# Patient Record
Sex: Male | Born: 1935 | Race: White | Hispanic: No | Marital: Married | Smoking: Former smoker
Health system: Southern US, Community
[De-identification: ages and names within clinical notes are randomized; demographics above are authoritative.]

## PROBLEM LIST (undated history)

## (undated) DIAGNOSIS — E119 Type 2 diabetes mellitus without complications: Secondary | ICD-10-CM

## (undated) DIAGNOSIS — K311 Adult hypertrophic pyloric stenosis: Secondary | ICD-10-CM

## (undated) DIAGNOSIS — K219 Gastro-esophageal reflux disease without esophagitis: Secondary | ICD-10-CM

## (undated) DIAGNOSIS — K279 Peptic ulcer, site unspecified, unspecified as acute or chronic, without hemorrhage or perforation: Secondary | ICD-10-CM

## (undated) DIAGNOSIS — I2510 Atherosclerotic heart disease of native coronary artery without angina pectoris: Secondary | ICD-10-CM

## (undated) DIAGNOSIS — I10 Essential (primary) hypertension: Secondary | ICD-10-CM

## (undated) DIAGNOSIS — I480 Paroxysmal atrial fibrillation: Secondary | ICD-10-CM

## (undated) DIAGNOSIS — C679 Malignant neoplasm of bladder, unspecified: Secondary | ICD-10-CM

## (undated) DIAGNOSIS — C787 Secondary malignant neoplasm of liver and intrahepatic bile duct: Secondary | ICD-10-CM

## (undated) DIAGNOSIS — M069 Rheumatoid arthritis, unspecified: Secondary | ICD-10-CM

## (undated) DIAGNOSIS — Z9289 Personal history of other medical treatment: Secondary | ICD-10-CM

## (undated) DIAGNOSIS — N200 Calculus of kidney: Secondary | ICD-10-CM

## (undated) HISTORY — DX: Gastro-esophageal reflux disease without esophagitis: K21.9

---

## 1999-01-21 ENCOUNTER — Other Ambulatory Visit: Admission: RE | Admit: 1999-01-21 | Discharge: 1999-01-21 | Payer: Self-pay | Admitting: Urology

## 2002-11-03 ENCOUNTER — Inpatient Hospital Stay (HOSPITAL_COMMUNITY): Admission: AD | Admit: 2002-11-03 | Discharge: 2002-11-06 | Payer: Self-pay | Admitting: Emergency Medicine

## 2002-12-02 ENCOUNTER — Emergency Department (HOSPITAL_COMMUNITY): Admission: EM | Admit: 2002-12-02 | Discharge: 2002-12-02 | Payer: Self-pay | Admitting: Emergency Medicine

## 2004-07-29 ENCOUNTER — Inpatient Hospital Stay (HOSPITAL_BASED_OUTPATIENT_CLINIC_OR_DEPARTMENT_OTHER): Admission: RE | Admit: 2004-07-29 | Discharge: 2004-07-29 | Payer: Self-pay | Admitting: Cardiology

## 2008-03-26 ENCOUNTER — Ambulatory Visit: Payer: Self-pay | Admitting: Internal Medicine

## 2008-04-09 ENCOUNTER — Ambulatory Visit: Payer: Self-pay | Admitting: Internal Medicine

## 2009-12-23 ENCOUNTER — Emergency Department (HOSPITAL_COMMUNITY): Admission: EM | Admit: 2009-12-23 | Discharge: 2009-12-23 | Payer: Self-pay | Admitting: Family Medicine

## 2009-12-24 ENCOUNTER — Emergency Department (HOSPITAL_COMMUNITY): Admission: EM | Admit: 2009-12-24 | Discharge: 2009-12-24 | Payer: Self-pay | Admitting: Family Medicine

## 2010-09-09 NOTE — Cardiovascular Report (Signed)
NAMEDEKLIN, BIELER               ACCOUNT NO.:  [id number]   MEDICAL RECORD NO.:  [id number]          PATIENT TYPE:  OIB   LOCATION:  6501                         FACILITY:  [facility]   PHYSICIAN:  Mohan N. Sharyn Lull, M.D. DATE OF BIRTH:  February 08, 1931   DATE OF PROCEDURE:  07/29/2004  DATE OF DISCHARGE:                              CARDIAC CATHETERIZATION   PROCEDURE:  1.  Left cardiac cath with selective left and right coronary angiography.  2.  LV-graphy via right groin using Judkins technique.   INDICATIONS FOR PROCEDURE:  Mr. Fries is a 77 year old white male with past  medical history significant for peptic ulcer disease, benign hypertrophy of  prostate.  He came to the office complaining of retrosternal chest  discomfort associated with shortness of breath and feeling weak and tired.  He denies any nausea, vomiting, diaphoresis.  Denies PND, orthopnea, or leg  swelling.  Denies palpitation, lightheadedness, or syncope.  States lately  he gets tired and short of breath with minimal exertion and feels fatigued.  Denies rest or nocturnal angina.  Denies any cardiac workup in the past.   PAST MEDICAL HISTORY:  1.  As above.  2.  History of upper GI bleeding.   PAST SURGICAL HISTORY:  None.   ALLERGIES:  None.   MEDICATIONS:  Takes Cardura 2 mg p.o. q.d., baby aspirin p.r.n., and  vitamins.   SOCIAL HISTORY:  He is married, one child, chews tobacco for 40+ years, no  history of alcohol abuse, was a Chiropodist.   FAMILY HISTORY:  Father died of stroke at the age of 27.  He was  hypertensive.  Mother had Alzheimer's disease.  Three brothers in good  health, two sisters in good.  One brother died of Alzheimer's disease.   PHYSICAL EXAMINATION:  GENERAL:  He is alert, awake, oriented x 3, in no  acute distress.  VITAL SIGNS:  Blood pressure was 130/80, pulse was 77.  HEENT:  Conjunctivae pink.  NECK:  Supple.  No JVD, no bruit.  LUNGS:  Clear to auscultation without  rhonchi or rales.  CARDIOVASCULAR:  S1 and S2 was normal.  There was soft systolic murmur and  soft S4 gallop.  ABDOMEN:  Soft.  Bowel sounds were present, nontender.  EXTREMITIES:  There is no clubbing, cyanosis, or edema.   EKG done in the office showed normal sinus rhythm with no acute ischemic  changes.   IMPRESSION:  1.  New onset angina, rule out coronary insufficiency.  2.  Tobacco abuse.  3.  Positive history of peptic ulcer disease.  4.  Exertional dyspnea, weakness, probably angina equivalent, rule out      coronary insufficiency.  Discussed with the patient regarding various      options of treatment, i.e., noninvasive stress testing versus left cath,      its risks and benefits, i.e., death, MI, stroke, need for emergency      CABG, local vascular complications, accept and consented for left cath.      The patient was also started on baby aspirin 1 tablet daily, Toprol 25  mg 1/2 tablet daily, and Nexium 40 mg p.o. daily.   PROCEDURE:  After obtaining the informed consent, the patient was brought to  the cath lab and was placed on fluoroscopy table.  The right groin was  prepped and draped in usual fashion.  Xylocaine 2% was used for local  anesthesia in the right groin.  With __________ thin-wall needle 4-French  arterial sheath was placed.  The sheath was aspirated and flushed.  Next, a  4 French left Judkins catheter was advanced over the wire under fluoroscopic  guidance up into the ascending aorta.  Wire was pulled out.  The catheter  was aspirated and connected to the manifold.  Catheter was further advanced  into left coronary ostium.  Multiple views of the left system were taken.  Next the catheter was disengaged and was pulled out over the wire and was  replaced with 4-French right Judkins catheter which was advanced over the  wire under fluoroscopic guidance up to the ascending aorta.  Wire was pulled  out.  The catheter was aspirated and connected to the  manifold.  Catheter  was further advanced and engaged into right coronary ostium.  Multiple views  of the right system were taken.  Next the catheter was disengaged and was  pulled out over the wire and was replaced with 4-French pigtail catheter  which was advanced over the wire under fluoroscopic guidance up to the  ascending aorta.  Wire was pulled out.  The catheter was aspirated and  connected to the manifold.  Catheter was further advanced across the aortic  valve into the LV.  LV pressures were recorded.  Next LV-graphy was done in  30-degree RAO position.  Postangiographic pressures were recorded from LV,  and then pullback pressures were recorded from the aorta.  There was no  gradient across the aortic valve.  Next the pigtail catheter was pulled out  over the wire.  Sheaths were aspirated and flushed.   FINDINGS:  LV showed good LV systolic function.  There was mild  anterolateral wall hypokinesia, EF of 50-55%.  Left main was patent.  LAD  has 50-60% proximal stenosis.  Diagonal 1 is small which is patent.  Diagonal 2 is very small which is patent.  Diagonal 3 and 4 were very, very  small which were patent.  Left circumflex was large which was patent.  OM1  is very, very small which is patent.  OM 2 is large which has proximal  aneurysmal dilatation which is patent.  OM 3 is small which is patent.  RCA  is small which is patent.  The patient has codominant coronary system.  The  patient tolerated procedure well.  There were no complications.  The patient  was on his way to recovery room in stable condition.      MNH/MEDQ  D:  07/29/2004  T:  07/29/2004  Job:  [id number]   cc:   Cardiac Cath Lab

## 2010-09-09 NOTE — H&P (Signed)
NAMEELLWOOD, STEIDLE NO.:  [id number]   MEDICAL RECORD NO.:  [id number]                   PATIENT TYPE:  EMS   LOCATION:  MAJO                                 FACILITY:  [facility]   PHYSICIAN:  Iva Boop, M.D. [facility]           DATE OF BIRTH:  12-15-1930   DATE OF ADMISSION:  11/03/2002  DATE OF DISCHARGE:                                HISTORY & PHYSICAL   PRIMARY CARE PHYSICIAN:  Windle Guard, M.D., of [facility].  The phone number is [phone].   CHIEF COMPLAINT:  Soft black stools with nausea, weakness, and dizziness.   HISTORY OF PRESENT ILLNESS:  Mr. Covin is a 77 year old white male who has  a history of GI bleeds occurring about 40 years ago and then again about 20  years ago, per his history.  He did require transfusions on these occasions,  but no surgery.  Occasionally he is now using Pepcid AC for meal-induced  dyspepsia.  He denies chronic nausea.  Denies chronic pain.  Denies bleeding  per rectum in the past.  He was seen by Dr. Jeannetta Nap on October 10, 2002, for  complaint of dizziness and weakness.  At that time, he had a lot of labs  drawn and an EKG.  The EKG was normal.  The hemoglobin was 6.3.  A PSA was  elevated at 9.1 and BNP was normal at 72.4.  I am not certain if he had a  chest x-ray or not.  The patient uses Dramamine occasionally for symptoms of  inner ear problems that include dizziness and occasional nausea.   The patient has been having soft black stools since Saturday.  Per Dr.  Ledell Noss rectal exam, there is melena present.  He has had persistent  dizziness along with nausea and this morning self induced some nonbloody and  noncoffee ground emesis.  Vital signs in the ED show some tachycardia and  orthostasis of pulse, but blood pressures are not technically orthostatic.  The hemoglobin is 12.8 and again that compares with a hemoglobin of 16.3 on  October 10, 2002.  It is unclear as to whether this  patient ever had lower  endoscopic evaluation because he says that Dr. Annabell Howells removed some polyps or  did some biopsies.  Since Dr. Annabell Howells is a urologist, we are wondering this  might not have been some prostate biopsies rather than colon evaluation.   PAST MEDICAL HISTORY:  1. Peptic ulcer disease with GI bleeds as above.  2. Benign prostatic hypertrophy.  The PSA was elevated at 9.1 in the office     recently.  3. Status post left elbow surgery following injury.  4. Status post childhood appendectomy.  5. Inner ear problems.   ALLERGIES:  No known drug allergies.   MEDICATIONS:  1. Dramamine p.r.n.  BC's rarely and none recently.  2. Pepcid AC p.r.n.  Uses these infrequently  and has no recent acceleration     in the use of Pepcid AC.   SOCIAL HISTORY:  The patient lives with his wife.  He chews one package of  chewing tobacco daily.  Quit smoking tobacco in the late 1970s.  Does not  consume alcoholic beverages.   FAMILY HISTORY:  No colorectal disease, no GI bleeds, and no ulcers.   REVIEW OF SYSTEMS:  NEUROLOGIC:  Chronic intermittent dizziness with or  without nausea which she attributes and has been attributed by physician to  inner ear problems.  These are associated with sinus congestion as well.  CARDIOVASCULAR:  No history of MI or any known coronary disease.  No  palpitations.  No chest pain.  No lower extremity edema.  PULMONARY:  Does  have some shortness of breath with exertion, but it really more fatigue with  the effort than actual shortness of breath.  This has been going on for up  to two months.  Denies cough.  HEMATOLOGIC:  Has not noted any vigorous  bleeding problems.  GENITOURINARY:  Has had frequency, hesitancy, and some  incomplete voiding.  DERMATOLOGIC:  No history of skin cancer.  EARS, NOSE,  AND THROAT:  Denies nonhealing ulcers or sores in the mouth.  All other  systems reviewed and were negative.   PHYSICAL EXAMINATION:  VITAL SIGNS:  Sitting blood  pressure 136/69 with  pulse of 102, standing blood pressure 110/75 with pulse of 106, respirations  20, temperature 99.9 degrees.  GENERAL APPEARANCE:  The patient is a somewhat pale-appearing, older, white  male who is a good historian and is in no distress.  HEENT:  There is no scleral icterus.  The conjunctivae are pink.  Extraocular movements intact.  Oropharynx:  There is no dentition.  Has an  upper denture in place and this was not removed.  No obvious sores.  Mucosa  is without exudates.  CHEST:  Clear to auscultation and percussion with excellent breath sounds  and excursion.  NECK:  No masses.  No bruits.  CORONARY:  There is regular rate and rhythm, though rhythm is slightly  tachycardic.  No murmurs, rubs, or gallops.  ABDOMEN:  Soft, nontender, and nondistended.  Bowel sounds are hypoactive.  No hepatosplenomegaly or masses.  No bruits.  RECTAL:  No masses.  Black, fecal occult blood positive stool per Dr. Beverely Pace.  Rectal exam not repeated.  EXTREMITIES:  3+ dorsalis pedis pulse.  No cyanosis, clubbing, or edema.  NEUROLOGIC:  No tremors.  He is alert and oriented x 3.  He is a bit hard of  hearing.  DERMATOLOGIC:  No spider angiomata.  No ulcers on the trunk, upper  extremities, or lower extremities.  PSYCHIATRIC:  The affect is appropriate.  He is in good spirits.   LABORATORY DATA:  The hemoglobin is 12.8, hematocrit 36.6, white blood cell  count 6.9, platelets 188, and MCV 89.8.  Sodium 142, potassium 4.7, BUN 36,  creatinine 0.2, glucose 127.  Urinalysis:  There is some leukocyte esterase  present, but no nitrites, rare epithelial cells, 0-2 red blood cells, 7-10  white blood cells, and rare bacteria present.  LFTs are within normal  limits.  The PT and INR are pending at this time.   ASSESSMENT:  1. Gastrointestinal bleed, likely upper.  Rule out recurrent peptic ulcer    disease.  Rule out gastrointestinal reflux disease.  Rule out     arteriovenous  malformations.  2. History of gastrointestinal bleed secondary  to peptic ulcer disease in     the now remote past.  3. Mild anemia, though significant drop of hemoglobin of approximately 5.5 g     in the last two to three weeks.  4. Inner ear disease with intermittent symptoms of dizziness and nausea.  5. Complaint of weakness not entirely explained by inner ear disease.  Now     the symptom is worse secondary to gastrointestinal bleeding.  6. History of elevated prostatic specific antigen.  Has symptoms consistent     with benign prostatic hypertrophy, but the urinalysis is slightly     suggestive of a urinary tract infection.   PLAN:  1. The patient is to be admitted for IV fluids and IV Protonix and planned     for upper endoscopy this afternoon.  2. Await coagulation times and correct if needed.  3.     Also plan to check serum Helicobacter pylori and serial hemoglobins and     hematocrits.  4. Diet for now is to be NPO until following endoscopy when clears can     likely be initiated.  5. Will check a urine culture to make sure he does not have a UTI.     Jennye Moccasin, P.A. [facility]                   Iva Boop, M.D. [facility]    SG/MEDQ  D:  11/03/2002  T:  11/03/2002  Job:  [id number]

## 2010-09-09 NOTE — Discharge Summary (Signed)
Craig Waters, Craig Waters NO.:  [id number]   MEDICAL RECORD NO.:  [id number]                   PATIENT TYPE:  INP   LOCATION:  4728                                 FACILITY:  [facility]   PHYSICIAN:  Iva Boop, M.D. [facility]           DATE OF BIRTH:  1930/10/26   DATE OF ADMISSION:  11/03/2002  DATE OF DISCHARGE:  11/06/2002                                 DISCHARGE SUMMARY   ADMISSION DIAGNOSES:  1. Acute gastrointestinal bleed, likely upper.  Rule out peptic ulcer     disease, rule out gastroesophageal reflux disease.  Rule out     arteriovenous malformations.  2. History of gastrointestinal bleed secondary to peptic ulcer disease in     the remote past.  3. Mild anemia though has had a significant drop of 5.5 grams on his     hemoglobin in the last two to three weeks.  4. Inner ear disease with chronic intermittent symptoms of dizziness and     nausea.  5. Complains of weakness not entirely explained by inner ear disease.     Symptoms now worse secondary to gastrointestinal bleed.  6. Recent history of elevated prostatic specific antigens.  Symptoms are     consistent with benign prostatic hypertrophy though urinalysis slightly     suggestive of urinary tract infection.  7. Status post childhood appendectomy.  8. Status post left elbow surgery following injury.   DISCHARGE DIAGNOSES:  1. Gastrointestinal bleed secondary to duodenal ulcer status post hemostatic     therapy with epinephrine injection, BICAP application and endo-clip.  2. Clo-test positive, will be treated with antibiotics as outpatient.  3. Anemia secondary to gastrointestinal bleed.  Status post transfusion with     a total of three units packed red blood cells during this admission.  4. Obstructive uropathy secondary to benign prostatic hypertrophy.  Symptoms     resolved with initiation of Flomax.  No evidence for urinary tract     infection on repeat urinalysis.  Urine cultures  were ordered but never     sent.   BRIEF HISTORY:  Craig Waters is a pleasant and healthy and active 77 year old  white male who has a remote history of peptic ulcer with bleeding about 40  years ago and then about 20 years ago.  He did not require any surgery but  did receive transfusions with these bleeds and used to remotely take  Tagamet.  He had been using Pepcid AC of late for meal induced dyspepsia  which was not a chronic, frequent problem.  He does occasionally get some  epigastric pain.  The patient had seen Dr. Jeannetta Nap on around October 10, 2002  for complaints of dizziness and weakness.  Labs were ordered, one of which  was a hemoglobin which measured 16.3, and a PSA which measured 9.1.  The  patient has chronic intermittent dizziness associated sometimes with nausea  that have been attributed to inner ear problems.  No specific diagnosis for  his weakness was made.  The patient had been using Flomax in the past but  was not having any obstructive urinary symptoms and Flomax was not restarted  by Dr. Jeannetta Nap.  About three days prior to presenting to the emergency room  the patient started having soft black stools and these persisted about once  or twice a day until the day of admission.  His dizziness was more  persistent than usual and it was associated with some nausea.  On the  morning of presentation he self-induced some non bloody and non coffee-  ground emesis.  In the emergency department melena was found on rectal exam.  He had some tachycardia but no technical orthostasis.  The hemoglobin at  that time in the emergency room was measuring 12.8.  Status as to endoscopy  and colonoscopy were unclear; does not sound like he has ever had  colonoscopy because he said Dr. Annabell Howells had removed some polyps and Dr. Annabell Howells  is a urologist.  The patient was evaluated in the emergency room by Dr. Stan Head and admitted for presumed acute upper GI bleed.   LABORATORY DATA:  Hemoglobin  initially 12.8, reached a low of 8.8 on July  13, and was 10.6 at discharge.  Hematocrit low of 25.6, it was 30.4 at  discharge.  Differential within normal limits.  Fecal occult blood testing  positive.  PT 12. 9, INR 1.0 and PTT 37.  Chemistries remarkable for glucose  of 111 and 127 as well as a BUN initially at 36 corrected to 22.  Calcium  low at 7.8.  Albumin low at 3.4.  Total bilirubin 0.5, alkaline phosphatase  63, AST 17, ALT 20, all of these normal.  Initial urinalysis showed some  moderate leukocyte esterase with 7 to 10 white cells, 0 to 2 red cells and  rare  bacteria.  Repeat urinalysis showed only the presence of some ketones  but no leukocytes esterase, no nitrites and microscopic not performed.   HOSPITAL COURSE:  The patient underwent endoscopy on the afternoon of his  arrival by Dr. Leone Payor.  An ulcer was found in the duodenal bulb which was  pulsating with active bleeding.  This area was injected with epinephrine and  initially had hemostasis but he began oozing again and BICAP therapy was  applied.  There was still some oozing and more epinephrine was applied.  The  BICAP coag was applied with the Rby coagulator.  Bleeding did finally stop  after the second epinephrine injection.  The patient was admitted to unit 3300 for close observation following the  endoscopy.   The patient received two units of packed red blood cells early in the wee  hours of November 05, 2002.  He again received a single unit of packed cells  over night of the 14th through the 15th.  Stooling continued and was dark in  color initially, but on the 15th it was brown.   On November 05, 2002 the patient was complaining of severe urinary symptoms and  was in quite a lot of pain and this had begun the night before.  He was  unable to void any significant volume.  An in and out catheter was performed for post void residual and about 800 cc of post void urine were measured.  The Foley was not left in  place.  However he was started on Flomax.  Pyridium was  also added because he complained of some dysuria and it was  difficult to determine whether this was dysuria or bladder type pain and  there was some question as to whether he had a urinary tract infection.  In  any event, over the next six to 24 hours his urologic symptoms resolved; he  was voiding large volumes with no problem.  The patient did have some  elevation in his temperature associated with blood transfusions to 100.4.  These resolved once the transfusions were completed.   The patient's diet was advanced from clear liquids to a low residue diet,  all of which he tolerated.  Nausea and vomiting along with abdominal pain  had never been part of his symptomatology in his presenting with a GI bleed.  The patient was cleared as safe for discharge and was in much improved  condition both from a GI stand-point and a urologic stand-point.   FOLLOW UP:  The patient has a follow up appointment arranged with Dr. Stan Head on Wednesday, November 19, 2002.  He has been advised that he should  contact Dr. Belva Crome office to make an appointment to follow up with him.   The patient was treated with IV Protonix drip initially during his  hospitalization and ultimately was changed over to twice daily oral  Protonix.  He is to continue on twice daily Protonix for the next couple of  weeks along with other medications to be outlined.  He was advised to avoid  any Aleve which he uses occasionally, and other drugs containing aspirin or  aspirin related products.  He was told that Tylenol would be fine to use if  he needed it.  Diet was to be low fiber, soft type foods for the next ten  days and then to resume a regular diet.   DISCHARGE MEDICATIONS:  1. Prilosec over-the-counter 20 mg b.i.d.  (he was given a prescription for     this).  2. Protonix 40 mg b.i.d. for two weeks.  After two weeks he is to drop down     to once daily on these  medications.  Depending on which costs him less he     can use either the over-the-counter Prilosec or the Protonix and he can     consult with the pharmacist as to which will be most cost effective.  3. Flomax 0.4 mg once daily.  4. Amoxicillin 500 mg two pills b.i.d. for 10 days.  5. Biaxin/clarithromycin 500 mg b.i.d. for 10 days.   CONDITION ON DISCHARGE:  Stable and improved.     Jennye Moccasin, P.A. [facility]                   Iva Boop, M.D. [facility]    SG/MEDQ  D:  11/06/2002  T:  11/07/2002  Job:  [id number]   cc:   Windle Guard, M.D.  [address]  [place], [place] [address]  Fax: [phone]   Excell Seltzer. Annabell Howells, M.D.  [address]  [place]  [place] [address]  Fax: [phone]    cc:   Windle Guard, M.D.  [address]  [place], [place] [address]  Fax: [phone]   Excell Seltzer. Annabell Howells, M.D.  [address]  [place]  [place] [address]  Fax: [phone]

## 2010-09-09 NOTE — H&P (Signed)
Craig Waters, Craig Waters NO.:  [id number]   MEDICAL RECORD NO.:  [id number]                   PATIENT TYPE:  INP   LOCATION:  1824                                 FACILITY:  [facility]   PHYSICIAN:  Craig Waters, M.D. [facility]           DATE OF BIRTH:  [birth year]   DATE OF ADMISSION:  11/03/2002  DATE OF DISCHARGE:                                HISTORY & PHYSICAL   CHIEF COMPLAINT:  Black bowel movements, nausea, weakness and dizziness.   HISTORY OF PRESENT ILLNESS:  This is a 77 year old white man with a history  of ulcer bleeding approximately 40, and 20 years ago, requiring transfusions  but no surgery.  He has some dyspepsia after meals and takes some Pepcid AC.  He had been doing reasonably well, and then some dizziness and weakness on  October 10, 2002, and he saw Dr. Windle Waters, his primary care physician.  He  had results to tell us his hemoglobin was 16.3, PSA 9.1, beta natriuretic  peptide 72.4.  He uses some Dramamine for dizziness symptoms.  An  electrocardiogram was unremarkable at that time, by report.  Since Saturday, two days prior to admission, he has had black soft bowel  movements, melena by Dr. Lenard Waters examination today.  He has had  dizziness, particularly with rising, nausea, and he has had some self-  induced non-bloody emesis this morning.  He is slightly orthostatic with  pulse tachy to 114 in the emergency department.  His hemoglobin is 12.8.  He  has used some occasional BC's and other non-steroidals, but it does not  sound like he has used them on a regular basis.   PAST MEDICAL HISTORY:  1. As above.  2. PSA of 9.1, apparently due to benign prostatic hypertrophy.  He is     followed by Dr. Excell Waters. Craig Waters.  3. Status post left elbow surgery after an injury.  4. Status post appendectomy as a child.  5. Inner ear problems, uncertain etiology.   ALLERGIES:  No known drug allergies.   HOME MEDICATIONS:  As above,  Dramamine, rare BC's, Pepcid AC infrequently.   SOCIAL HISTORY:  He lives with his wife.  He chews one packet of tobacco  every day.  He quit smoking late in the 1970s.  No alcohol.   FAMILY HISTORY:  No colorectal cancer or other GI bleeding or ulcer disease.   REVIEW OF SYSTEMS:  Chronic intermittent dizziness associated with some  nausea.  Also some sinus congestion.  He denies any angina symptoms or chest  pain or significant dyspnea.  Urinary frequency and hesitancy and incomplete  voiding are noted.  He has had some skin cancer.  All other systems appear  negative at this time.   PHYSICAL EXAMINATION:  GENERAL:  A pleasant elderly white man, slightly  pale.  VITAL SIGNS:  Blood pressure 136/69 sitting, pulse 102 sitting, blood  pressure 110/75 with a pulse of 106 standing, respirations 20, temperature  99.7 degrees.  HEENT:  Eyes anicteric.  Mouth free of lesions.  NECK:  Supple, no thyromegaly or masses.  CHEST:  Clear.  HEART:  S1, S2.  No murmurs or gallops, slightly tachycardic.  ABDOMEN:  Soft, nontender.  No organomegaly or masses.  Bowel sounds are  present but not increased.  RECTAL:  Per Dr. Ledell Waters exam, melena.  It is fecal occult blood positive.  EXTREMITIES:  Distal pulses intact in the lower extremities.  No clubbing,  cyanosis, or edema.  NEUROLOGIC:  Alert and oriented x3.  No tremor.  SKIN:  He has spider angiomata or rash.  PSYCHIATRIC:  Affect is appropriate.  NODES:  No neck or supraclavicular nodes.  No inguinal nodes.   LABORATORY DATA:  Hemoglobin 12.8, MCV 89, hematocrit 36, white count 6.9,  platelets 188.  PT and PTT are normal.  Liver function tests normal.  BMET:  BUN 36, creatinine 0.9, glucose 127.  A urinalysis shows rare epithelials, 7-  10 white cells, rare bacteria, negative nitrites, positive leukocyte  esterase.   ASSESSMENT:  1. Melena with upper gastrointestinal bleeding, probably recurrent peptic     ulcer disease.  The etiology is  not clear.  I suspect he has never been     evaluated for Helicobacter pylori.  2. Anemia, probably related to number one.  3. Pyuria, uncertain etiology, question related to benign prostatic     hypertrophy/prostatitis.   PLAN:  The patient will be admitted and hydrated.  IV Protonix will be  administered.  Plan for urgent endoscopy later today, to evaluate his  bleeding.  I have explained the risks and benefits and indications of an  upper GI endoscopy, and possible bleeding therapy to the patient.  He agrees  to proceed.                                               Craig Waters, M.D. [facility]    CEG/MEDQ  D:  11/03/2002  T:  11/03/2002  Job:  [id number]   cc:   Craig Waters, M.D.  [address]  [place], [place] [address]  Fax: [phone]    cc:   Craig Waters, M.D.  [address]  [place], [place] [address]  Fax: [phone]

## 2010-09-26 ENCOUNTER — Encounter: Payer: Self-pay | Admitting: Family Medicine

## 2010-09-26 ENCOUNTER — Ambulatory Visit (INDEPENDENT_AMBULATORY_CARE_PROVIDER_SITE_OTHER): Payer: Medicare Other | Admitting: Family Medicine

## 2010-09-26 DIAGNOSIS — N400 Enlarged prostate without lower urinary tract symptoms: Secondary | ICD-10-CM

## 2010-09-26 DIAGNOSIS — K5732 Diverticulitis of large intestine without perforation or abscess without bleeding: Secondary | ICD-10-CM

## 2010-09-26 DIAGNOSIS — K279 Peptic ulcer, site unspecified, unspecified as acute or chronic, without hemorrhage or perforation: Secondary | ICD-10-CM

## 2010-09-26 DIAGNOSIS — H409 Unspecified glaucoma: Secondary | ICD-10-CM

## 2010-09-26 DIAGNOSIS — K5792 Diverticulitis of intestine, part unspecified, without perforation or abscess without bleeding: Secondary | ICD-10-CM

## 2010-09-26 DIAGNOSIS — K219 Gastro-esophageal reflux disease without esophagitis: Secondary | ICD-10-CM

## 2010-09-26 NOTE — Progress Notes (Signed)
  Subjective:    Patient ID: Craig Waters, male    DOB: May 23, 1930, 77 y.o.   MRN: [id number]  HPI Patient is seen to establish care. Past medical history reviewed. History of BPH. Followed by urologist. He takes doxazosin 8 mg daily and apparently still has some obstructive urinary symptoms. He is followed regularly by urologist. He reports a past history of peptic ulcer disease, GERD, glaucoma, and diverticulitis history. Also reported colon polyps.  GERD symptoms stable.  No recent vision changes.  No recent abdominal pain.   He has not had a physical exam in several years.  Patient quit smoking in 1980. Denies alcohol use. Works with sheet metal. He is married.   Review of Systems  Constitutional: Negative for fever, activity change, appetite change and fatigue.  HENT: Negative for ear pain, congestion and trouble swallowing.   Eyes: Negative for pain and visual disturbance.  Respiratory: Negative for cough, shortness of breath and wheezing.   Cardiovascular: Negative for chest pain and palpitations.  Gastrointestinal: Negative for nausea, vomiting, abdominal pain, diarrhea, constipation, blood in stool, abdominal distention and rectal pain.  Genitourinary: Negative for dysuria, hematuria and testicular pain.  Musculoskeletal: Negative for joint swelling and arthralgias.  Skin: Negative for rash.  Neurological: Negative for dizziness, syncope and headaches.  Hematological: Negative for adenopathy.  Psychiatric/Behavioral: Negative for confusion and dysphoric mood.       Objective:   Physical Exam  Constitutional: He is oriented to person, place, and time. He appears well-developed and well-nourished.  HENT:  Right Ear: External ear normal.  Left Ear: External ear normal.  Mouth/Throat: Oropharynx is clear and moist.  Neck: Neck supple. No thyromegaly present.  Cardiovascular: Normal rate, regular rhythm and normal heart sounds.   Pulmonary/Chest: Effort normal and breath  sounds normal. No respiratory distress. He has no wheezes. He has no rales.  Musculoskeletal: He exhibits no edema.  Lymphadenopathy:    He has no cervical adenopathy.  Neurological: He is alert and oriented to person, place, and time.          Assessment & Plan:  #1 reported history of BPH. Still has positive symptomatology. Followup with urologist #2 history of reported peptic ulcer disease #3 history of GERD #4 history of glaucoma #5 history diverticulitis  #6 wellness issues. Send for old records. Schedule followup Medicare wellness exam in 1-2 months

## 2010-10-24 ENCOUNTER — Ambulatory Visit (INDEPENDENT_AMBULATORY_CARE_PROVIDER_SITE_OTHER): Payer: Medicare Other | Admitting: Family Medicine

## 2010-10-24 ENCOUNTER — Encounter: Payer: Self-pay | Admitting: Family Medicine

## 2010-10-24 DIAGNOSIS — R5381 Other malaise: Secondary | ICD-10-CM

## 2010-10-24 DIAGNOSIS — E785 Hyperlipidemia, unspecified: Secondary | ICD-10-CM

## 2010-10-24 DIAGNOSIS — K279 Peptic ulcer, site unspecified, unspecified as acute or chronic, without hemorrhage or perforation: Secondary | ICD-10-CM

## 2010-10-24 DIAGNOSIS — N400 Enlarged prostate without lower urinary tract symptoms: Secondary | ICD-10-CM

## 2010-10-24 DIAGNOSIS — Z0000 Encounter for general adult medical examination without abnormal findings: Secondary | ICD-10-CM

## 2010-10-24 DIAGNOSIS — R5383 Other fatigue: Secondary | ICD-10-CM

## 2010-10-24 LAB — LIPID PANEL
Cholesterol: 161 mg/dL (ref 0–200)
LDL Cholesterol: 117 mg/dL — ABNORMAL HIGH (ref 0–99)
Total CHOL/HDL Ratio: 6

## 2010-10-24 LAB — BASIC METABOLIC PANEL
BUN: 21 mg/dL (ref 6–23)
Chloride: 109 mEq/L (ref 96–112)
Potassium: 4.1 mEq/L (ref 3.5–5.1)
Sodium: 142 mEq/L (ref 135–145)

## 2010-10-24 MED ORDER — FINASTERIDE 5 MG PO TABS: 5.0000 mg | ORAL_TABLET | Freq: Every day | ORAL | Status: DC
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
## 2010-10-24 NOTE — Patient Instructions (Signed)
Try over-the-counter Allegra or Zyrtec for allergy symptoms Recommend yearly flu vaccine

## 2010-10-25 NOTE — Progress Notes (Signed)
Quick Note:  Pt informed. He wanted me to ask what is the plan you talked to him about for his prostate?? ______

## 2010-10-28 NOTE — Progress Notes (Signed)
Quick Note:  Pt informed and will check with his pharmacy ______

## 2011-10-02 ENCOUNTER — Other Ambulatory Visit: Payer: Self-pay | Admitting: Urology

## 2011-10-13 ENCOUNTER — Encounter (HOSPITAL_COMMUNITY): Payer: Self-pay | Admitting: Pharmacy Technician

## 2011-10-18 ENCOUNTER — Encounter (HOSPITAL_COMMUNITY): Payer: Self-pay

## 2011-10-18 ENCOUNTER — Encounter (HOSPITAL_COMMUNITY)
Admission: RE | Admit: 2011-10-18 | Discharge: 2011-10-18 | Disposition: A | Discharge status: Home / Self Care | Payer: Medicare Other | Source: Ambulatory Visit | Attending: Urology | Admitting: Urology

## 2011-10-18 HISTORY — PX: EYE SURGERY: SHX253

## 2011-10-18 HISTORY — PX: OTHER SURGICAL HISTORY: SHX169

## 2011-10-18 HISTORY — PX: CARDIAC CATHETERIZATION: SHX172

## 2011-10-18 HISTORY — PX: APPENDECTOMY: SHX54

## 2011-10-18 HISTORY — PX: CATARACT EXTRACTION, BILATERAL: SHX1313

## 2011-10-18 LAB — COMPREHENSIVE METABOLIC PANEL
ALT: 13 U/L (ref 0–53)
AST: 15 U/L (ref 0–37)
Alkaline Phosphatase: 82 U/L (ref 39–117)
CO2: 25 mEq/L (ref 19–32)
Chloride: 107 mEq/L (ref 96–112)
GFR calc Af Amer: >90 mL/min (ref >90)
GFR calc non Af Amer: 80 mL/min — ABNORMAL LOW (ref >90)
Glucose, Bld: 101 mg/dL — ABNORMAL HIGH (ref 70–99)
Potassium: 4.2 mEq/L (ref 3.5–5.1)
Sodium: 142 mEq/L (ref 135–145)
Total Bilirubin: 0.4 mg/dL (ref 0.3–1.2)

## 2011-10-18 LAB — CBC
Hemoglobin: 15.4 g/dL (ref 13.0–17.0)
MCH: 30.8 pg (ref 26.0–34.0)
Platelets: 175 10*3/uL (ref 150–400)
RBC: 5 MIL/uL (ref 4.22–5.81)
WBC: 5.7 10*3/uL (ref 4.0–10.5)

## 2011-10-18 LAB — SURGICAL PCR SCREEN
MRSA, PCR: POSITIVE — AB
Staphylococcus aureus: POSITIVE — AB

## 2011-10-18 NOTE — Pre-Procedure Instructions (Signed)
10-18-11 EKG/CXR(09-26-11) reports with chart. W. Asees Manfredi,RN 10-18-11 1520 Pt. 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 Also made aware of Contact Isolation required during hospital stay.W. Kennon Portela

## 2011-10-18 NOTE — Patient Instructions (Addendum)
20 Craig Waters  10/18/2011   Your procedure is scheduled on:   7-5--2013  Report to [facility] at 0630       AM .  Call this number if you have problems the morning of surgery: [phone]   Remember:   Do not eat food:After Midnight.    Take these medicines the morning of surgery with A SIP OF WATER: Doxazosin   Do not wear jewelry, make-up or nail polish.  Do not wear lotions, powders, or perfumes. You may wear deodorant.  Do not shave 48 hours prior to surgery.(face and neck okay, no shaving of legs)  Do not bring valuables to the hospital.  Contacts, dentures or bridgework may not be worn into surgery.  Leave suitcase in the car. After surgery it may be brought to your room.  For patients admitted to the hospital, checkout time is 11:00 AM the day of discharge.   Patients discharged the day of surgery will not be allowed to drive home.  Name and phone number of your driver: daughter  Special Instructions: CHG Shower Use Special Wash: 1/2 bottle night before surgery and 1/2 bottle morning of surgery.(avoid face and genitals)   Please read over the following fact sheets that you were given: MRSA Information, Blood Transfusion fact sheet, Incentive Spirometry Instruction.

## 2011-10-26 NOTE — Anesthesia Preprocedure Evaluation (Addendum)
Anesthesia Evaluation  Patient identified by MRN, date of birth, ID band Patient awake    Reviewed: Allergy & Precautions, H&P , NPO status , Patient's Chart, lab work & pertinent test results  Airway Mallampati: II TM Distance: >3 FB Neck ROM: full    Dental  (+) Edentulous Upper and Edentulous Lower   Pulmonary neg pulmonary ROS, shortness of breath and with exertion,  breath sounds clear to auscultation  Pulmonary exam normal       Cardiovascular Exercise Tolerance: Good negative cardio ROS  Rhythm:regular Rate:Normal     Neuro/Psych Vertigo. glaucoma negative neurological ROS  negative psych ROS   GI/Hepatic negative GI ROS, Neg liver ROS, hiatal hernia, GERD-  Medicated and Controlled,  Endo/Other  negative endocrine ROS  Renal/GU negative Renal ROS  negative genitourinary   Musculoskeletal   Abdominal   Peds  Hematology negative hematology ROS (+)   Anesthesia Other Findings   Reproductive/Obstetrics negative OB ROS                          Anesthesia Physical Anesthesia Plan  ASA: II  Anesthesia Plan: General   Post-op Pain Management:    Induction: Intravenous, Rapid sequence and Cricoid pressure planned  Airway Management Planned: Oral ETT  Additional Equipment:   Intra-op Plan:   Post-operative Plan: Extubation in OR  Informed Consent: I have reviewed the patients History and Physical, chart, labs and discussed the procedure including the risks, benefits and alternatives for the proposed anesthesia with the patient or authorized representative who has indicated his/her understanding and acceptance.   Dental Advisory Given  Plan Discussed with: CRNA and Surgeon  Anesthesia Plan Comments:        Anesthesia Quick Evaluation

## 2011-10-27 ENCOUNTER — Inpatient Hospital Stay (HOSPITAL_COMMUNITY)
Admission: RE | Admit: 2011-10-27 | Discharge: 2011-11-01 | DRG: 708 | Disposition: A | Discharge status: Home / Self Care | Payer: Medicare Other | Source: Ambulatory Visit | Attending: Urology | Admitting: Urology

## 2011-10-27 ENCOUNTER — Ambulatory Visit (HOSPITAL_COMMUNITY): Payer: Medicare Other | Admitting: Anesthesiology

## 2011-10-27 ENCOUNTER — Encounter (HOSPITAL_COMMUNITY): Payer: Self-pay | Admitting: Anesthesiology

## 2011-10-27 ENCOUNTER — Encounter (HOSPITAL_COMMUNITY)
Admission: RE | Disposition: A | Discharge status: Home / Self Care | Payer: Self-pay | Source: Ambulatory Visit | Attending: Urology

## 2011-10-27 ENCOUNTER — Encounter (HOSPITAL_COMMUNITY): Payer: Self-pay | Admitting: *Deleted

## 2011-10-27 DIAGNOSIS — K219 Gastro-esophageal reflux disease without esophagitis: Secondary | ICD-10-CM | POA: Diagnosis present

## 2011-10-27 DIAGNOSIS — Z87891 Personal history of nicotine dependence: Secondary | ICD-10-CM

## 2011-10-27 DIAGNOSIS — Z8711 Personal history of peptic ulcer disease: Secondary | ICD-10-CM

## 2011-10-27 DIAGNOSIS — R310 Gross hematuria: Secondary | ICD-10-CM | POA: Diagnosis present

## 2011-10-27 DIAGNOSIS — R972 Elevated prostate specific antigen [PSA]: Secondary | ICD-10-CM | POA: Diagnosis present

## 2011-10-27 DIAGNOSIS — N138 Other obstructive and reflux uropathy: Principal | ICD-10-CM | POA: Diagnosis present

## 2011-10-27 DIAGNOSIS — R351 Nocturia: Secondary | ICD-10-CM | POA: Diagnosis present

## 2011-10-27 DIAGNOSIS — N401 Enlarged prostate with lower urinary tract symptoms: Principal | ICD-10-CM | POA: Diagnosis present

## 2011-10-27 DIAGNOSIS — Z87442 Personal history of urinary calculi: Secondary | ICD-10-CM

## 2011-10-27 DIAGNOSIS — N400 Enlarged prostate without lower urinary tract symptoms: Secondary | ICD-10-CM

## 2011-10-27 DIAGNOSIS — R350 Frequency of micturition: Secondary | ICD-10-CM | POA: Diagnosis present

## 2011-10-27 DIAGNOSIS — Z79899 Other long term (current) drug therapy: Secondary | ICD-10-CM

## 2011-10-27 DIAGNOSIS — R39198 Other difficulties with micturition: Secondary | ICD-10-CM | POA: Diagnosis present

## 2011-10-27 HISTORY — PX: CYSTOSCOPY: SHX5120

## 2011-10-27 HISTORY — PX: PROSTATECTOMY: SHX69

## 2011-10-27 LAB — BASIC METABOLIC PANEL
GFR calc Af Amer: 89 mL/min — ABNORMAL LOW (ref >90)
GFR calc non Af Amer: 76 mL/min — ABNORMAL LOW (ref >90)
Glucose, Bld: 155 mg/dL — ABNORMAL HIGH (ref 70–99)
Potassium: 3.7 mEq/L (ref 3.5–5.1)
Sodium: 138 mEq/L (ref 135–145)

## 2011-10-27 SURGERY — PROSTATECTOMY, SUPRAPUBIC APPROACH
Anesthesia: General | Wound class: Clean Contaminated

## 2011-10-27 MED ORDER — LIDOCAINE HCL (CARDIAC) 20 MG/ML IV SOLN
INTRAVENOUS | Status: DC | PRN
  Administered 2011-10-27: 80 mg via INTRAVENOUS

## 2011-10-27 MED ORDER — EPHEDRINE SULFATE 50 MG/ML IJ SOLN
INTRAMUSCULAR | Status: DC | PRN
  Administered 2011-10-27: 10 mg via INTRAVENOUS
  Administered 2011-10-27: 5 mg via INTRAVENOUS
  Administered 2011-10-27 (×4): 10 mg via INTRAVENOUS
  Administered 2011-10-27: 5 mg via INTRAVENOUS

## 2011-10-27 MED ORDER — ACETAMINOPHEN 10 MG/ML IV SOLN
INTRAVENOUS | Status: DC | PRN
  Administered 2011-10-27: 1000 mg via INTRAVENOUS

## 2011-10-27 MED ORDER — PROPOFOL 10 MG/ML IV EMUL
INTRAVENOUS | Status: DC | PRN
  Administered 2011-10-27: 150 mg via INTRAVENOUS

## 2011-10-27 MED ORDER — GLYCOPYRROLATE 0.2 MG/ML IJ SOLN
INTRAMUSCULAR | Status: DC | PRN
  Administered 2011-10-27: .8 mg via INTRAVENOUS

## 2011-10-27 MED ORDER — LIDOCAINE HCL 2 % EX GEL
CUTANEOUS | Status: AC
  Filled 2011-10-27: qty 10

## 2011-10-27 MED ORDER — KETOROLAC TROMETHAMINE 15 MG/ML IJ SOLN
15.0000 mg | Freq: Four times a day (QID) | INTRAMUSCULAR | Status: DC
  Administered 2011-10-27 – 2011-10-30 (×11): 15 mg via INTRAVENOUS
  Filled 2011-10-27 (×18): qty 1

## 2011-10-27 MED ORDER — ONDANSETRON HCL 4 MG/2ML IJ SOLN
INTRAMUSCULAR | Status: DC | PRN
  Administered 2011-10-27: 4 mg via INTRAVENOUS

## 2011-10-27 MED ORDER — STERILE WATER FOR IRRIGATION IR SOLN
Status: DC | PRN
  Administered 2011-10-27: 1000 mL

## 2011-10-27 MED ORDER — DEXTROSE-NACL 5-0.45 % IV SOLN
INTRAVENOUS | Status: DC
  Administered 2011-10-28 – 2011-10-31 (×5): via INTRAVENOUS

## 2011-10-27 MED ORDER — CIPROFLOXACIN IN D5W 400 MG/200ML IV SOLN
INTRAVENOUS | Status: AC
  Filled 2011-10-27: qty 200

## 2011-10-27 MED ORDER — BELLADONNA ALKALOIDS-OPIUM 16.2-60 MG RE SUPP
RECTAL | Status: AC
  Filled 2011-10-27: qty 1

## 2011-10-27 MED ORDER — LACTATED RINGERS IV SOLN
INTRAVENOUS | Status: DC
  Administered 2011-10-27 (×2): via INTRAVENOUS
  Administered 2011-10-27: 1000 mL via INTRAVENOUS

## 2011-10-27 MED ORDER — HYDROMORPHONE HCL PF 1 MG/ML IJ SOLN
0.2500 mg | INTRAMUSCULAR | Status: DC | PRN
  Administered 2011-10-27 (×2): 0.5 mg via INTRAVENOUS

## 2011-10-27 MED ORDER — HEMOSTATIC AGENTS (NO CHARGE) OPTIME
TOPICAL | Status: DC | PRN
  Administered 2011-10-27: 1 via TOPICAL

## 2011-10-27 MED ORDER — INDIGOTINDISULFONATE SODIUM 8 MG/ML IJ SOLN
INTRAMUSCULAR | Status: DC | PRN
  Administered 2011-10-27: 1 mL via INTRAVENOUS

## 2011-10-27 MED ORDER — DEXTROSE-NACL 5-0.9 % IV SOLN
INTRAVENOUS | Status: AC
  Administered 2011-10-27: 1000 mL via INTRAVENOUS
  Administered 2011-10-28: 02:00:00 via INTRAVENOUS

## 2011-10-27 MED ORDER — SODIUM CHLORIDE 0.9 % IR SOLN
3000.0000 mL | Status: DC
  Administered 2011-10-27 (×3): 3000 mL

## 2011-10-27 MED ORDER — CIPROFLOXACIN IN D5W 400 MG/200ML IV SOLN
400.0000 mg | Freq: Two times a day (BID) | INTRAVENOUS | Status: DC
  Filled 2011-10-27: qty 200

## 2011-10-27 MED ORDER — HYDROMORPHONE HCL PF 1 MG/ML IJ SOLN
0.5000 mg | INTRAMUSCULAR | Status: DC | PRN
  Administered 2011-10-28 – 2011-10-29 (×2): 1 mg via INTRAVENOUS
  Filled 2011-10-27 (×2): qty 1

## 2011-10-27 MED ORDER — BELLADONNA ALKALOIDS-OPIUM 16.2-60 MG RE SUPP: 1.0000 | Freq: Four times a day (QID) | RECTAL | Status: DC | PRN

## 2011-10-27 MED ORDER — ROCURONIUM BROMIDE 100 MG/10ML IV SOLN
INTRAVENOUS | Status: DC | PRN
  Administered 2011-10-27: 30 mg via INTRAVENOUS
  Administered 2011-10-27 (×3): 20 mg via INTRAVENOUS

## 2011-10-27 MED ORDER — DIPHENHYDRAMINE HCL 12.5 MG/5ML PO ELIX: 12.5000 mg | ORAL_SOLUTION | Freq: Four times a day (QID) | ORAL | Status: DC | PRN

## 2011-10-27 MED ORDER — INDIGOTINDISULFONATE SODIUM 8 MG/ML IJ SOLN
INTRAMUSCULAR | Status: AC
  Filled 2011-10-27: qty 5

## 2011-10-27 MED ORDER — NEOSTIGMINE METHYLSULFATE 1 MG/ML IJ SOLN
INTRAMUSCULAR | Status: DC | PRN
  Administered 2011-10-27: 5 mg via INTRAVENOUS

## 2011-10-27 MED ORDER — BISACODYL 10 MG RE SUPP: 10.0000 mg | Freq: Every day | RECTAL | Status: DC | PRN

## 2011-10-27 MED ORDER — ONDANSETRON HCL 4 MG/2ML IJ SOLN: 4.0000 mg | INTRAMUSCULAR | Status: DC | PRN

## 2011-10-27 MED ORDER — ACETAMINOPHEN 10 MG/ML IV SOLN
INTRAVENOUS | Status: AC
  Filled 2011-10-27: qty 100

## 2011-10-27 MED ORDER — SUCCINYLCHOLINE CHLORIDE 20 MG/ML IJ SOLN
INTRAMUSCULAR | Status: DC | PRN
  Administered 2011-10-27: 100 mg via INTRAVENOUS

## 2011-10-27 MED ORDER — HYDROMORPHONE HCL PF 1 MG/ML IJ SOLN
INTRAMUSCULAR | Status: AC
  Filled 2011-10-27: qty 1

## 2011-10-27 MED ORDER — SODIUM CHLORIDE 0.9 % IR SOLN
Status: DC | PRN
  Administered 2011-10-27: 2000 mL
  Administered 2011-10-27: 1000 mL

## 2011-10-27 MED ORDER — DIPHENHYDRAMINE HCL 50 MG/ML IJ SOLN: 12.5000 mg | Freq: Four times a day (QID) | INTRAMUSCULAR | Status: DC | PRN

## 2011-10-27 MED ORDER — HETASTARCH-ELECTROLYTES 6 % IV SOLN
INTRAVENOUS | Status: DC | PRN
  Administered 2011-10-27 (×2): via INTRAVENOUS

## 2011-10-27 MED ORDER — FENTANYL CITRATE 0.05 MG/ML IJ SOLN
INTRAMUSCULAR | Status: DC | PRN
  Administered 2011-10-27: 50 ug via INTRAVENOUS
  Administered 2011-10-27: 25 ug via INTRAVENOUS
  Administered 2011-10-27: 50 ug via INTRAVENOUS
  Administered 2011-10-27: 25 ug via INTRAVENOUS
  Administered 2011-10-27: 100 ug via INTRAVENOUS

## 2011-10-27 MED ORDER — LACTATED RINGERS IV SOLN: INTRAVENOUS | Status: DC

## 2011-10-27 MED ORDER — BACITRACIN-NEOMYCIN-POLYMYXIN 400-5-5000 EX OINT: 1.0000 "application " | TOPICAL_OINTMENT | Freq: Three times a day (TID) | CUTANEOUS | Status: DC | PRN

## 2011-10-27 MED ORDER — BUPIVACAINE LIPOSOME 1.3 % IJ SUSP
20.0000 mL | Freq: Once | INTRAMUSCULAR | Status: AC
  Administered 2011-10-27: 50 mL
  Administered 2011-10-27: 266 mg
  Filled 2011-10-27: qty 20

## 2011-10-27 MED ORDER — ACETAMINOPHEN 10 MG/ML IV SOLN
1000.0000 mg | Freq: Four times a day (QID) | INTRAVENOUS | Status: AC
  Administered 2011-10-27 – 2011-10-28 (×4): 1000 mg via INTRAVENOUS
  Filled 2011-10-27 (×4): qty 100

## 2011-10-27 MED ORDER — KETOROLAC TROMETHAMINE 15 MG/ML IJ SOLN
INTRAMUSCULAR | Status: DC | PRN
  Administered 2011-10-27: 15 mg via INTRAVENOUS

## 2011-10-27 MED ORDER — CIPROFLOXACIN HCL 500 MG PO TABS
500.0000 mg | ORAL_TABLET | Freq: Two times a day (BID) | ORAL | Status: DC
  Administered 2011-10-27 – 2011-10-31 (×9): 500 mg via ORAL
  Filled 2011-10-27 (×12): qty 1

## 2011-10-27 MED ORDER — CIPROFLOXACIN IN D5W 400 MG/200ML IV SOLN
400.0000 mg | INTRAVENOUS | Status: AC
  Administered 2011-10-27: 400 mg via INTRAVENOUS

## 2011-10-27 SURGICAL SUPPLY — 65 items
ADAPTER CATH URET PLST 4-6FR (CATHETERS) IMPLANT
BAG URINE DRAINAGE (UROLOGICAL SUPPLIES) IMPLANT
BAG URO CATCHER STRL LF (DRAPE) IMPLANT
BLADE EXTENDED COATED 6.5IN (ELECTRODE) ×2 IMPLANT
BLADE HEX COATED 2.75 (ELECTRODE) ×2 IMPLANT
BLADE SURG SZ12 CARB STEEL (BLADE) ×2 IMPLANT
CATH FOLEY 2WAY SLVR 30CC 22FR (CATHETERS) IMPLANT
CATH FOLEY 2WAY SLVR 30CC 24FR (CATHETERS) IMPLANT
CATH FOLEY 3WAY 30CC 24FR (CATHETERS) ×1
CATH SILASTIC FOLEY 18FRX5CC (CATHETERS) ×2 IMPLANT
CATH URET 5FR 28IN CONE TIP (BALLOONS)
CATH URET 5FR 28IN OPEN ENDED (CATHETERS) IMPLANT
CATH URET 5FR 70CM CONE TIP (BALLOONS) IMPLANT
CATH URET WHISTLE 5FR 28IN (CATHETERS) ×4 IMPLANT
CATH URET WHISTLE 6FR (CATHETERS) IMPLANT
CATH URO 16X24FR 3W FL PS (CATHETERS) ×1 IMPLANT
CATH URTH STD 24FR FL 3W 2 (CATHETERS) IMPLANT
CLOTH BEACON ORANGE TIMEOUT ST (SAFETY) ×2 IMPLANT
COVER SURGICAL LIGHT HANDLE (MISCELLANEOUS) ×2 IMPLANT
DRAIN CHANNEL 10F 3/8 F FF (DRAIN) ×2 IMPLANT
DRAPE CAMERA CLOSED 9X96 (DRAPES) ×2 IMPLANT
DRAPE LAPAROTOMY T 102X78X121 (DRAPES) ×2 IMPLANT
DRAPE UTILITY 15X26 (DRAPE) ×2 IMPLANT
DRAPE WARM FLUID 44X44 (DRAPE) ×2 IMPLANT
ELECT REM PT RETURN 9FT ADLT (ELECTROSURGICAL) ×2
ELECTRODE REM PT RTRN 9FT ADLT (ELECTROSURGICAL) ×1 IMPLANT
EVACUATOR SILICONE 100CC (DRAIN) ×2 IMPLANT
GAUZE SPONGE 4X4 16PLY XRAY LF (GAUZE/BANDAGES/DRESSINGS) ×2 IMPLANT
GLOVE BIOGEL M STRL SZ7.5 (GLOVE) ×2 IMPLANT
GOWN STRL REIN XL XLG (GOWN DISPOSABLE) ×2 IMPLANT
GUIDEWIRE STR DUAL SENSOR (WIRE) ×2 IMPLANT
KIT BASIN OR (CUSTOM PROCEDURE TRAY) ×2 IMPLANT
LUBRICANT JELLY ST 5GR 8946 (MISCELLANEOUS) ×8 IMPLANT
MANIFOLD NEPTUNE II (INSTRUMENTS) ×2 IMPLANT
NEEDLE HYPO 22GX1.5 SAFETY (NEEDLE) ×2 IMPLANT
NS IRRIG 1000ML POUR BTL (IV SOLUTION) ×4 IMPLANT
PACK CYSTO (CUSTOM PROCEDURE TRAY) IMPLANT
PACK GENERAL/GYN (CUSTOM PROCEDURE TRAY) ×2 IMPLANT
PLUG CATH AND CAP STER (CATHETERS) ×6 IMPLANT
SCRUB PCMX 4 OZ (MISCELLANEOUS) ×2 IMPLANT
SET IRRIG Y TYPE TUR BLADDER L (SET/KITS/TRAYS/PACK) IMPLANT
SPONGE GAUZE 4X4 12PLY (GAUZE/BANDAGES/DRESSINGS) ×2 IMPLANT
SPONGE LAP 18X18 X RAY DECT (DISPOSABLE) ×2 IMPLANT
SPONGE LAP 4X18 X RAY DECT (DISPOSABLE) IMPLANT
STAPLER SKIN PROX WIDE 3.9 (STAPLE) IMPLANT
STAPLER VISISTAT 35W (STAPLE) IMPLANT
SUT ETHILON 3 0 PS 1 (SUTURE) ×6 IMPLANT
SUT PDS AB 1 CTX 36 (SUTURE) IMPLANT
SUT PDS AB 1 TP1 96 (SUTURE) ×4 IMPLANT
SUT SILK 0 (SUTURE)
SUT SILK 0 30XBRD TIE 6 (SUTURE) IMPLANT
SUT SILK 2 0 (SUTURE)
SUT SILK 2-0 30XBRD TIE 12 (SUTURE) IMPLANT
SUT VIC AB 0 UR5 27 (SUTURE) ×6 IMPLANT
SUT VIC AB 2-0 UR5 27 (SUTURE) ×4 IMPLANT
SUT VIC AB 2-0 UR6 27 (SUTURE) ×8 IMPLANT
SUT VICRYL 3 0 UR 6 27 (SUTURE) ×4 IMPLANT
SYR 30ML LL (SYRINGE) IMPLANT
SYR CONTROL 10ML LL (SYRINGE) ×2 IMPLANT
TAPE CLOTH SURG 4X10 WHT LF (GAUZE/BANDAGES/DRESSINGS) ×2 IMPLANT
TOWEL OR 17X26 10 PK STRL BLUE (TOWEL DISPOSABLE) ×2 IMPLANT
TOWEL OR NON WOVEN STRL DISP B (DISPOSABLE) ×2 IMPLANT
TUBING CONNECTING 10 (TUBING) IMPLANT
WATER STERILE IRR 1500ML POUR (IV SOLUTION) IMPLANT
WIRE COONS/BENSON .038X145CM (WIRE) IMPLANT

## 2011-10-27 NOTE — Anesthesia Postprocedure Evaluation (Signed)
  Anesthesia Post-op Note  Patient: Craig Waters  Procedure(s) Performed: Procedure(s) (LRB): PROSTATECTOMY SUPRAPUBIC (N/A) CYSTOSCOPY FLEXIBLE (N/A)  Patient Location: PACU  Anesthesia Type: General  Level of Consciousness: awake and alert   Airway and Oxygen Therapy: Patient Spontanous Breathing  Post-op Pain: mild  Post-op Assessment: Post-op Vital signs reviewed, Patient's Cardiovascular Status Stable, Respiratory Function Stable, Patent Airway and No signs of Nausea or vomiting  Post-op Vital Signs: stable  Complications: No apparent anesthesia complications

## 2011-10-27 NOTE — Progress Notes (Signed)
Pharmacy: Cipro ordered > 24 hr post-op. MD contacted; stated contaminated procedure.   Thank you,Caidyn Henricksen L 4:09 PM

## 2011-10-27 NOTE — Progress Notes (Signed)
  Subjective: Patient reports awake and alert. Mild pain ( Espareil). Foley draining well. Dark urine: blood and indigo carmine.  Objective: Vital signs in last 24 hours: Temp:  [96.8 F (36 C)-97.5 F (36.4 C)] 97.5 F (36.4 C) (07/05 1515) Pulse Rate:  [75-96] 91  (07/05 1515) Resp:  [12-20] 16  (07/05 1515) BP: (94-150)/(34-79) 112/59 mmHg (07/05 1515) SpO2:  [93 %-100 %] 99 % (07/05 1515) FiO2 (%):  [2 %] 2 % (07/05 1515) Weight:  [99.791 kg (220 lb)] 99.791 kg (220 lb) (07/05 0625)A  Intake/Output from previous day:   Intake/Output this shift: Total I/O In: 16109 [I.V.:3500; Other:7200; IV Piggyback:1000] Out: 8720 [Urine:7400; Drains:120; Blood:1200]  Past Medical History  Diagnosis Date  . Blood in stool   . Diverticulitis   . GERD (gastroesophageal reflux disease)   . Glaucoma   . Ulcer   . History of transfusion of whole blood   . Prostate disorder   . Shortness of breath 10-18-11    with exertion, cardiac cath done 7-8 yrs ago negative.  . Vertigo 10-18-11    inner ear issues occ. -tx. Bonine as needed  . Arthritis 10-18-11    back, fingers  . H/O hiatal hernia 10-18-11    noted on recent CXR    Physical Exam:  General:wdwnwm in NAD Lungs - Normal respiratory effort, chest expands symmetrically.  Abdomen - Soft, non-tender & non-distended. Foley draining well. No clots.   Lab Results:  Basename 10/27/11 1250  WBC --  HGB 10.4*  HCT 30.9*   BMET  Basename 10/27/11 1250  NA 138  K 3.7  CL 108  CO2 23  GLUCOSE 155*  BUN 13  CREATININE 0.97  CALCIUM 7.6*   No results found for this basename: LABURIN:1 in the last 72 hours Results for orders placed during the hospital encounter of 10/18/11  SURGICAL PCR SCREEN     Status: Abnormal   Collection Time   10/18/11 10:15 AM      Component Value Range Status Comment   MRSA, PCR POSITIVE (*) NEGATIVE Final    Staphylococcus aureus POSITIVE (*) NEGATIVE Final      Studies/Results: @RISRSLT24 @  Assessment/Plan: Pt is stable post op. No clots. IV rate 125 today with NS. To be switched to 100cc/hr of D5 1/2NS in AM. Cr. Normal. GFR is normal. Will switch to oral meds.   Craig Waters I 10/27/2011, 5:17 PM

## 2011-10-27 NOTE — Transfer of Care (Signed)
Immediate Anesthesia Transfer of Care Note  Patient: Craig Waters  Procedure(s) Performed: Procedure(s) (LRB): PROSTATECTOMY SUPRAPUBIC (N/A) CYSTOSCOPY FLEXIBLE (N/A)  Patient Location: PACU  Anesthesia Type: General  Level of Consciousness: awake, alert , oriented and patient cooperative  Airway & Oxygen Therapy: Patent and on on Face mask  Post-op Assessment: Report given to PACU RN, Post -op Vital signs reviewed and stable and Patient moving all extremities  Post vital signs: Reviewed and stable  Complications: No apparent anesthesia complications

## 2011-10-27 NOTE — H&P (Signed)
ief Complaint  cc: Dr. Wilburt Finlay   Reason For Visit     Cystoscopy & review CT results   Active Problems Problems  1. Benign Prostatic Hypertrophy With Urinary Obstruction 600.01 2. Gross Hematuria 599.71 3. PSA,Elevated 790.93  History of Present Illness           78 YO male (previous patient of Dr. Annabell Howells) returns today for cystoscopy & review CT results for hx of gross hematuria.  He was last seen by Jetta Lout, NP on 08/18/11 because of intermittant gross hematuria X 2 weeks.  Noticed blood began after riding tractor. Denies passing any recent stone material. Has had no change in bowel movements.    H/O BPH and might be a potential candidate for cooled thermotherapy.   He is currently is listed as being on finasteride (which he is not taking) and doxazosin 8 mg 1 po daily.   He is voiding ok on the doxazosin 8mg  and his PVR is only 39.5cc with a prostate volume of 227cc.  His IPSS is 15cc.  He does have a reduced flow.   He does better if his bowels are regular.    08/25/11  FISH - positive  08/18/11  PSA - 23.07   Past Medical History Problems  1. History of  Esophageal Reflux 530.81 2. History of  Nephrolithiasis V13.01 3. History of  Peptic Ulcer V12.71 4. History of  Prostatitis 601.9  Surgical History Problems  1. History of  Biopsy Of The Prostate Needle  Current Meds 1. Doxazosin Mesylate 8 MG Oral Tablet; Take 1 tablet every day; Therapy: 22Dec2009 to  (Evaluate:01May2013)  Requested for: 31Jan2013; Last Rx:31Jan2013 2. Finasteride 5 MG Oral Tablet; TAKE 1 TABLET DAILY AS DIRECTED; Therapy: 26Apr2013 to  (Evaluate:22Nov2013)  Requested for: 26Apr2013; Last Rx:26Apr2013  Allergies Medication  1. No Known Drug Allergies  Family History Problems  1. Family history of  Family Health Status Number Of Children 1 daughter  Social History Problems  1. Caffeine Use 2 2. Occupation: sheet metal 3. History of  Tobacco Use V15.82 used other forms of  tobacco Denied  4. Alcohol Use  Review of Systems Genitourinary, constitutional, skin, eye, otolaryngeal, hematologic/lymphatic, cardiovascular, pulmonary, endocrine, musculoskeletal, gastrointestinal, neurological and psychiatric system(s) were reviewed and pertinent findings if present are noted.  Genitourinary: urinary frequency, nocturia, difficulty starting the urinary stream, weak urinary stream, urinary stream starts and stops, hematuria and initiating urination requires straining.  Musculoskeletal: back pain.    Vitals Vital Signs [Data Includes: Last 1 Day]  22May2013 03:07PM  Blood Pressure: 145 / 80 Temperature: 98.9 F Heart Rate: 87  Physical Exam Rectal: Rectal exam demonstrates normal sphincter tone, the anus is normal on inspection. and no tenderness. Estimated prostate size is 4+. Normal rectal tone, no rectal masses, prostate is smooth, symmetric and non-tender. The prostate has no nodularity, is not indurated and is not tender. The left seminal vesicle is nonpalpable.  Genitourinary: Examination of the penis demonstrates no discharge, no masses, no adherence of the prepuce, no lesions and a normal meatus. The penis is uncircumcised. The scrotum is normal in appearance and without lesions. Examination of the right scrotum demonstrates no mass. Examination of the left scrotum demostrates no mass. The right epididymis is palpably normal and non-tender. The left epididymis is palpably normal and non-tender. The right testis is palpably normal, non-tender and without masses. The left testis is normal, non-tender and without masses.  Lymphatics: The femoral and inguinal nodes are not enlarged or tender.  Skin: Normal skin turgor, no visible rash and no visible skin lesions.    Results/Data Urine [Data Includes: Last 1 Day]   22May2013  COLOR YELLOW   APPEARANCE CLOUDY   SPECIFIC GRAVITY 1.020   pH 7.0   GLUCOSE NEG mg/dL  BILIRUBIN NEG   KETONE NEG mg/dL  BLOOD LARGE    PROTEIN TRACE mg/dL  UROBILINOGEN 1 mg/dL  NITRITE NEG   LEUKOCYTE ESTERASE NEG   SQUAMOUS EPITHELIAL/HPF FEW   WBC 0-3 WBC/hpf  RBC TNTC RBC/hpf  BACTERIA NONE SEEN   CRYSTALS NONE SEEN   CASTS NONE SEEN    Procedure  Procedure: Cystoscopy   Indication: Hematuria.  Informed Consent: Risks, benefits, and potential adverse events were discussed and informed consent was obtained from the patient.  Prep: The patient was prepped with betadine.  Anesthesia:. Local anesthesia was administered intraurethrally with 2% lidocaine jelly.  Antibiotic prophylaxis: Ciprofloxacin.  Procedure Note:  Urethral meatus:. No abnormalities. Sensitive.  Anterior urethra: No abnormalities . Sensitive.  Prostatic urethra:. There was visual obstruction of the prostatic urethra. The lateral and median prostatic lobes were enlarged. An enlarged intravesical median lobe was visualized.  Bladder: Visulization was obscured due to cloudy urine. The ureteral orifices were not able to be identified. Examination of the bladder demonstrated trabeculation and a diverticulum erythematous mucosa and cellules.    Assessment Assessed  1. Benign Prostatic Hypertrophy With Urinary Obstruction 600.01 2. Gross Hematuria 599.71 3. PSA,Elevated 790.93   78 yo male wiht IPSS=28, and + FISH test, wtih PUS showing 226cc prostate gland size, and cysto showing massive prostate with large intravesicle median lobe. He has trabecullation , cellules, and I am unable to tell if he has any baldder tumors. I have discussed the case with the patient. He has a performance status status of H-0, farming each day. He has a high psa of 25, which could represent prostate cancer, or may simply reflect the size of his gland. I have outlined the plan for a  suprapubic prostatectomy, with pre-op cysto to be sure there is no "surprise tumor" in view of his + FISH test. He may need JJ stents also. He understands that he may need transfusion, and  hospitalization, and post op physical therapy to help him regain control of his voiding ability. He also knows that he may have PCa inside his prostate, but, at 31, that this is not my main concern. He may also have bladder cancer, and, while more seriouis than PCa, is still not as significant as his impending chronic urinary retention, requiring chronic catheterization. He is SUPER sensitive in his urethra, and is normally active- and would not tolerate long term foley for chronic retention. He would not be possible to TUR, lase, or use the microwave techniques for prostate treatment.    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 He knows that his major risk is in the OR and in the immediate post op period.   Plan  Arrange open suprapubic prostatectomy.   Signatures Electronically signed by : Jethro Bolus, M.D.; Sep 13 2011  4:42PM

## 2011-10-27 NOTE — Preoperative (Signed)
Beta Blockers   Reason not to administer Beta Blockers:Not Applicable 

## 2011-10-27 NOTE — Progress Notes (Signed)
   CARE MANAGEMENT NOTE 10/27/2011  Patient:  Craig Waters, Craig Waters   Account Number:  [id number]  Date Initiated:  10/27/2011  Documentation initiated by:  Jiles Crocker  Subjective/Objective Assessment:   ADMITTED WITH Flexible cystoscopy, open suprapubic prostatectomy     Action/Plan:   PCP IS DR Shelah Lewandowsky;   Anticipated DC Date:  10/31/2011   Anticipated DC Plan:  HOME/SELF CARE      DC Planning Services  CM consult                Status of service:  In process, will continue to follow Medicare Important Message given?  NA - LOS <3 / Initial given by admissions (If response is "NO", the following Medicare IM given date fields will be blank)  Per UR Regulation:  Reviewed for med. necessity/level of care/duration of stay  Comments:  10/27/2011- B Dezmen Alcock RN, BSN, MHA

## 2011-10-27 NOTE — Addendum Note (Signed)
Addendum  created 10/27/11 1554 by Elyn Peers, CRNA   Modules edited:Anesthesia Events, Anesthesia Medication Administration

## 2011-10-27 NOTE — Progress Notes (Signed)
Chest x-ray  Results OK  Per Dr. Leta Jungling

## 2011-10-27 NOTE — Interval H&P Note (Signed)
History and Physical Interval Note:  10/27/2011 8:35 AM  Craig Waters  has presented today for surgery, with the diagnosis of benign prostatic hypertrophy  The various methods of treatment have been discussed with the patient and family. After consideration of risks, benefits and other options for treatment, the patient has consented to  Procedure(s) (LRB): PROSTATECTOMY SUPRAPUBIC (N/A) CYSTOSCOPY FLEXIBLE (N/A) as a surgical intervention .  The patient's history has been reviewed, patient examined, no change in status, stable for surgery.  I have reviewed the patients' chart and labs.  Questions were answered to the patient's satisfaction.     Jethro Bolus I

## 2011-10-27 NOTE — Progress Notes (Signed)
Dr. Leta Jungling notified of patient's blood pressures and Aldrete scores in PACU; also made aware of patient's HGB. Results in PACU-

## 2011-10-27 NOTE — Op Note (Signed)
Pre-operative diagnosis :  BPH with masses prostate enlargement, gross hematuria, positive fish test, elevated PSA.  Postoperative diagnosis: Same  Operation: Flexible cystoscopy, open suprapubic prostatectomy  Surgeon:  S. Patsi Sears, MD  First assistant: Mena Goes  Anesthesia:  general  Preparation: After appropriate preanesthesia, the patient was brought to the operating room, placed on the operating room in the dorsal supine position where general endotracheal anesthesia was introduced. He was placed in the flex position, with care taken to protect his low back and legs. Armband was rechecked.  Review history: The patient is a 78 year old male, with gross hematuria, with entry with doxazosin 8 mg and finasteride for several years. PVR is 39.5 cc, fish test was positive, prostate ultrasound showed a volume of 227 cc, prostate symptom score she showed 15/7, with reduced flow. PSA was 23.07. 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 Cystoscopy showed no cancer, but the patient had massive trabeculation cellule formation. The patient was counseled to have open prostatectomy, with repeat cystoscopy to be sure he had no evidence of bladder cancer. He presents now for surgical repair.  Statement of  Likelihood of Success: Excellent. TIME-OUT observed.:  Procedure: With the patient in the flex position, and prepped and draped in usual fashion, flexural cystoscopy was accomplished, which shows normal patent urethral meatus, and pendulous urethra. Prostatic urethra was noted to be obstructed by trilobar BPH, and the prostatic urethra was approximately 6 cm in length. At the bladder neck, there was a massive lobe of median hypertrophy of the prostate. Bladder cystoscopy showed trabeculation and some formation. There was early bladder stone formation. There was no identifiable bladder cancer, however. The ureteral orifices were poorly identified because of the massive trabeculation and cloudy urine.  The  cystoscope was removed, and a 24 three-way Simplastic catheter was passed. Incision was outlined from the umbilicus to the pubis. After timeout, incision was made, subcutaneous tissue dissected with electrosurgical unit. The midline was identified and incised. Using a sponge stick, the right and left pelvic gutters were dissected, with care taken to avoid venous injury. The venous plexus of Santorini was identified and ligated with 2-0 Vicryl sutures both horizontally, and vertically. Bladder was filled with saline, and an using a Bovie, incised vertically. Massive BPH was identified, with the median lobe the prostate appearing to be the size of the palm of the hand. The ureters were identified, and the patient was given IV indigo carmine. Ureteral stents were placed with difficulty, thought secondary to tortuosity of the ureters, and also because of the retraction of the open bladder. The retractor was placed, and the ureteral stents were left in place during the course of the surgery. Outline of the open prostatectomy was accomplished using the electrosurgical unit, and incision in the prostatic urethra mucosa was accomplished. Dissection of the prostate was accomplished with blunt and sharp dissection circumferentially around the urethra. The prostate was removed in one portion, and delivered over the Foley catheter. The bladder was irrigated with normal saline. Partially 1500 cc of blood loss was noted. The tissue is sent to laboratory for examination. Using 2 separate 3-0 Vicryl sutures, the posterior bladder wall was tacked to the posterior prostatic fossa. The ureteral catheters were removed, and urine was noted to be within a glove which had and used to collect urine from the ureteral catheters during the procedure. The urine was blue secondary to the indigo carmine. However, there was not a great amount of urine. It was felt that the lack of urine may be  secondary to tortuosity of the ureters, or  obstruction of the ureteral stents secondary to the retraction device used during the procedure. The patient was given copious amounts of: Crystalloid, and his blood loss was estimated to be 1200 cc. Hemoglobin preoperatively was approximately 15 and his post case hemoglobin was approximately 9. Hg will be rechecked in recovery room.  Following removal of the prostate, Foley balloon was inflated, and FloSeal was injected into the prostatic fossa. The Foley balloon was then brought into the ladder neck to put pressure on the prostatic fossa, and the catheter was impacted in place for 3 minutes. The packing was removed and the catheter was placed in the bladder. Suprapubic catheter was placed through the skin with a skin incision, and then through the rectus muscle, and through the left lateral bladder wall into the bladder with 5 cc in the balloon. This will be for anticipated extended use postoperatively, and can be used for continuous radiation. A 10 Blake drain is then placed through the right lower quadrant, by incising the skin, placed in the [place] drain through the skin and rectus muscle, and through the right bladder wall. A suture in place with 3-0 nylon suture. The drain is also sutured in place with 3-0 nylon suture.  Expareil is then injected into the wound edges, to afford postop pain relief. The patient was given 15 mg of IV Toradol. SCD stockings were in place for the entire procedure. The bladder was closed in 2 layers with 30 and 2-0 running Vicryl suture, and the fashion was closed with #1 PDS running suture. Skin was closed with skin stapler. The patient was awakened, taken recovery in good condition.

## 2011-10-27 NOTE — Progress Notes (Signed)
B MET AND HGB. AND HCT. RESULTS NOTED

## 2011-10-27 NOTE — Progress Notes (Signed)
Dr. Leta Jungling paged re: Chest x-ray results in chart .

## 2011-10-27 NOTE — Progress Notes (Signed)
B MET AND HGB. AND HCT. DRAWN BY LAB 

## 2011-10-27 NOTE — Progress Notes (Signed)
Patient states no bowel prep instructions from doctor.

## 2011-10-27 NOTE — Addendum Note (Signed)
Addendum  created 10/27/11 1554 by Nakoa Ganus J Sundra Haddix, CRNA   Modules edited:Anesthesia Events, Anesthesia Medication Administration    

## 2011-10-28 LAB — BASIC METABOLIC PANEL
CO2: 24 mEq/L (ref 19–32)
Chloride: 109 mEq/L (ref 96–112)
Glucose, Bld: 139 mg/dL — ABNORMAL HIGH (ref 70–99)
Sodium: 137 mEq/L (ref 135–145)

## 2011-10-28 LAB — HEMOGLOBIN AND HEMATOCRIT, BLOOD: HCT: 27.4 % — ABNORMAL LOW (ref 39.0–52.0)

## 2011-10-28 NOTE — Progress Notes (Signed)
  Subjective: Patient reports No pain. Has not passed flatus yet.  Objective: Vital signs in last 24 hours: Temp:  [96.8 F (36 C)-98.5 F (36.9 C)] 98.2 F (36.8 C) (07/06 0505) Pulse Rate:  [75-105] 100  (07/06 0505) Resp:  [12-22] 22  (07/06 0505) BP: (94-113)/(34-62) 105/61 mmHg (07/06 0505) SpO2:  [93 %-100 %] 97 % (07/06 0505) FiO2 (%):  [2 %] 2 % (07/05 1515) Weight:  [220 lb (99.791 kg)] 220 lb (99.791 kg) (07/06 0505)  Intake/Output from previous day: 07/05 0701 - 07/06 0700 In: 16109 [I.V.:3500; IV Piggyback:1000] Out: 14985 [UEAVW:09811; Drains:240; Blood:1200] Intake/Output this shift:    Physical Exam:  General:Alert and oriented Lungs - Normal respiratory effort, chest expands symmetrically.  Abdomen - Soft, non-tender & non-distended. Wound: clean and dry. Penis and scrotal contents are within normal limits. CBI running.  Urine pinkish. Hgb: 9.1 Lab Results:  Basename 10/28/11 0459 10/27/11 1250  HGB 9.1* 10.4*  HCT 27.4* 30.9*   BMET  Basename 10/28/11 0459 10/27/11 1250  NA 137 138  K 4.0 3.7  CL 109 108  CO2 24 23  GLUCOSE 139* 155*  BUN 15 13  CREATININE 1.03 0.97  CALCIUM 7.3* 7.6*   No results found for this basename: LABPT:3,INR:3 in the last 72 hours No results found for this basename: LABURIN:1 in the last 72 hours Results for orders placed during the hospital encounter of 10/18/11  SURGICAL PCR SCREEN     Status: Abnormal   Collection Time   10/18/11 10:15 AM      Component Value Range Status Comment   MRSA, PCR POSITIVE (*) NEGATIVE Final    Staphylococcus aureus POSITIVE (*) NEGATIVE Final     Studies/Results: No results found.  Assessment/Plan:  S/P open prostatectomy.  OOB.  Advance diet.   LOS: 1 day   Kylynn Street-HENRY 10/28/2011, 10:13 AM

## 2011-10-29 NOTE — Progress Notes (Signed)
Clamped  CBI per Dr. Madilyn Hook order this AM and to restart CBI if urine output is bloody. Patient urine output became bloody after clamping the CBI for about 5hours, restarted CBI @ 1430. Will  Continue to assess patient. Continue to encourage patient to ambulate.

## 2011-10-29 NOTE — Progress Notes (Signed)
  Subjective: Patient reports: No pain.   Tolerates diet well.  Passing flatus.  Objective: Vital signs in last 24 hours: Temp:  [98.5 F (36.9 C)-99.7 F (37.6 C)] 99.7 F (37.6 C) (07/07 0544) Pulse Rate:  [98-109] 105  (07/07 0544) Resp:  [20-22] 20  (07/07 0544) BP: (109-144)/(56-67) 144/66 mmHg (07/07 0544) SpO2:  [92 %-99 %] 95 % (07/07 0544) Weight:  [226 lb 3.1 oz (102.6 kg)] 226 lb 3.1 oz (102.6 kg) (07/06 1400)  Intake/Output from previous day: 07/06 0701 - 07/07 0700 In: 1440 [P.O.:240; I.V.:1200] Out: 8730 [Urine:8700; Drains:30] Intake/Output this shift: Total I/O In: 240 [P.O.:240] Out: 1300 [Urine:1300]  Physical Exam:  General: Alert and orientd Lungs - Normal respiratory effort, chest expands symmetrically.  Abdomen - Soft, non-tender & non-distended Wound: clean and dry. Penis and scrotal contents are within normal limits. JP: 30 cc thus far today. 240 ml yesterday. CBI: running.  Urine pinkish. Lab Results:  Basename 10/28/11 0459 10/27/11 1250  HGB 9.1* 10.4*  HCT 27.4* 30.9*   BMET  Basename 10/28/11 0459 10/27/11 1250  NA 137 138  K 4.0 3.7  CL 109 108  CO2 24 23  GLUCOSE 139* 155*  BUN 15 13  CREATININE 1.03 0.97  CALCIUM 7.3* 7.6*   No results found for this basename: LABPT:3,INR:3 in the last 72 hours No results found for this basename: LABURIN:1 in the last 72 hours Results for orders placed during the hospital encounter of 10/18/11  SURGICAL PCR SCREEN     Status: Abnormal   Collection Time   10/18/11 10:15 AM      Component Value Range Status Comment   MRSA, PCR POSITIVE (*) NEGATIVE Final    Staphylococcus aureus POSITIVE (*) NEGATIVE Final      Assessment/Plan:  S/P open prostatectomy  Plan: D/C CBI.  Restart if urine is bloody.  Check H & H.   LOS: 2 days   Shylyn Younce-HENRY 10/29/2011, 10:28 AM

## 2011-10-30 ENCOUNTER — Encounter (HOSPITAL_COMMUNITY): Payer: Self-pay | Admitting: Urology

## 2011-10-30 LAB — CBC
HCT: 24.4 % — ABNORMAL LOW (ref 39.0–52.0)
Hemoglobin: 8.2 g/dL — ABNORMAL LOW (ref 13.0–17.0)
MCH: 31.3 pg (ref 26.0–34.0)
MCV: 93.1 fL (ref 78.0–100.0)
RBC: 2.62 MIL/uL — ABNORMAL LOW (ref 4.22–5.81)
WBC: 8.8 10*3/uL (ref 4.0–10.5)

## 2011-10-30 LAB — CREATININE, FLUID (PLEURAL, PERITONEAL, JP DRAINAGE): Creat, Fluid: 1 mg/dL

## 2011-10-30 LAB — BASIC METABOLIC PANEL
BUN: 9 mg/dL (ref 6–23)
CO2: 26 mEq/L (ref 19–32)
Calcium: 7.7 mg/dL — ABNORMAL LOW (ref 8.4–10.5)
Chloride: 108 mEq/L (ref 96–112)
Creatinine, Ser: 0.95 mg/dL (ref 0.50–1.35)
Glucose, Bld: 133 mg/dL — ABNORMAL HIGH (ref 70–99)

## 2011-10-30 LAB — HEMOGLOBIN AND HEMATOCRIT, BLOOD: HCT: 26.1 % — ABNORMAL LOW (ref 39.0–52.0)

## 2011-10-30 NOTE — Progress Notes (Signed)
  Subjective: Post op day # 3. S/p open suprapubic prostatectomy of massive gland. Pt has had some bleeding over weekend. Prn CBI. Hgb 9.1 yesterday. Pt had dizziness x 1 yesterday. + flatus and feels that he is going to have BM today. PO's going well. JP 90cc total ( ? Shift or 24 hrs).  Objective: Vital signs in last 24 hours: Temp:  [98.8 F (37.1 C)-99.3 F (37.4 C)] 98.8 F (37.1 C) (07/08 0500) Pulse Rate:  [86-102] 86  (07/08 0500) Resp:  [18-20] 20  (07/08 0500) BP: (121-137)/(61-70) 121/70 mmHg (07/08 0500) SpO2:  [96 %-99 %] 99 % (07/08 0500)A  Intake/Output from previous day: 07/07 0701 - 07/08 0700 In: 2898.3 [P.O.:480; I.V.:2418.3] Out: 6440 [Urine:6350; Drains:90] Intake/Output this shift:    Past Medical History  Diagnosis Date  . Blood in stool   . Diverticulitis   . GERD (gastroesophageal reflux disease)   . Glaucoma   . Ulcer   . History of transfusion of whole blood   . Prostate disorder   . Shortness of breath 10-18-11    with exertion, cardiac cath done 7-8 yrs ago negative.  . Vertigo 10-18-11    inner ear issues occ. -tx. Bonine as needed  . Arthritis 10-18-11    back, fingers  . H/O hiatal hernia 10-18-11    noted on recent CXR    Physical Exam:  General: wdwnwm in NAD.  Lungs - Normal respiratory effort, chest expands symmetrically.  Abdomen - Soft, non-tender &  Suprapubic distention. nDressing in need of change. Wound healing well. JP with minimal pink drainage. Wound minimally tender. Penis and scrotum mild edema.   Lab Results:  Basename 10/30/11 0410 10/28/11 0459 10/27/11 1250  WBC 8.8 -- --  HGB 8.2* 9.1* 10.4*  HCT 24.4* 27.4* 30.9*   BMET  Basename 10/30/11 0410 10/28/11 0459  NA 139 137  K 3.8 4.0  CL 108 109  CO2 26 24  GLUCOSE 133* 139*  BUN 9 15  CREATININE 0.95 1.03  CALCIUM 7.7* 7.3*   No results found for this basename: LABURIN:1 in the last 72 hours Results for orders placed during the hospital encounter of  10/18/11  SURGICAL PCR SCREEN     Status: Abnormal   Collection Time   10/18/11 10:15 AM      Component Value Range Status Comment   MRSA, PCR POSITIVE (*) NEGATIVE Final    Staphylococcus aureus POSITIVE (*) NEGATIVE Final     Studies/Results: @RISRSLT24 @  Assessment/Plan: Very large open prostatectomy. He has both urethral foley and s-p tube in place. He sill have JP fluid for Cr today-may remove JP. Will have orthostatic BPs and follopw-up HgB--? Transfusion. Dressing change and shower today.   Natsha Guidry I 10/30/2011, 8:28 AM

## 2011-10-31 LAB — HEMOGLOBIN AND HEMATOCRIT, BLOOD: HCT: 25.9 % — ABNORMAL LOW (ref 39.0–52.0)

## 2011-10-31 LAB — TYPE AND SCREEN
ABO/RH(D): O POS
DAT, IgG: NEGATIVE
Donor AG Type: NEGATIVE
Unit division: 0
Unit division: 0

## 2011-10-31 NOTE — Plan of Care (Signed)
Problem: Phase II Progression Outcomes Goal: Progressing with IS, TCDB Outcome: Completed/Met Date Met:  10/31/11 Up to 2500 with the IS.

## 2011-10-31 NOTE — Progress Notes (Addendum)
Pt refused CHG bath this morning. He "stated I want to do it later, its to early. I did'nt have much sleep last nite." However, the  pt was cooperative with the IS teaching and demostrated use of the IS. Currently obtaining 500-800 on the IS. I will follow up with the dayshift RN for CHG completion.  Mid low abdomen incision C/D/I. Dressing change completed. Foley emptied for blood-tinged to bloody urine. H/H this  Am noted @8 .5/25.9 will follow up dayshift RN with results

## 2011-10-31 NOTE — Progress Notes (Signed)
Patient orthostatic BP Lying-144/72 HR-88, Sitting-131/55 HR-101, and standing 136/61 HR-95. Denies any dizziness or distress. Showered and ambulated in the hall, tolerated it well.  Urine output clearing up, clear pinkish color encouraging increase fluid intake.  F/U with plan of care.

## 2011-10-31 NOTE — Progress Notes (Signed)
4 Days Post-Op Subjective: Patient reports + BM, tolerating PO and pain control good. No nausea or vomiting.   JP out yesterday. Had orthostatic BP yesterday. Hx of vertigo, with BP lying 138/81, HR=90; sitting BP 119/72 HR 107; standing 129/62 HR 105 ( unknown symptoms). Pt had dressing change.   Objective: Vital signs in last 24 hours: Temp:  [98.7 F (37.1 C)-99.9 F (37.7 C)] 98.8 F (37.1 C) (07/09 0500) Pulse Rate:  [88-107] 88  (07/09 0500) Resp:  [20-22] 20  (07/09 0500) BP: (119-146)/(60-81) 146/72 mmHg (07/09 0500) SpO2:  [93 %-94 %] 94 % (07/09 0500)  Intake/Output from previous day: 07/08 0701 - 07/09 0700 In: 1902.9 [P.O.:840; I.V.:1062.9] Out: 3600 [Urine:3600] Intake/Output this shift:    Physical Exam:  General:alert, cooperative, appears stated age and no distress GI: wound healing well. Dry. Abd soft. Drain tract dry. S-p tube site not red.  Male genitalia: not done no testicular masses no bladder distension noted Penis: normal, no lesions Urethral Meatus: normal Testicles: normal, no masses Scrotum: normal Epididymis: normal   Lab Results:  Basename 10/31/11 0405 10/30/11 1411 10/30/11 0410  HGB 8.5* 8.8* 8.2*  HCT 25.9* 26.1* 24.4*   BMET  Basename 10/30/11 0410  NA 139  K 3.8  CL 108  CO2 26  GLUCOSE 133*  BUN 9  CREATININE 0.95  CALCIUM 7.7*   No results found for this basename: LABPT:3,INR:3 in the last 72 hours No results found for this basename: LABURIN:1 in the last 72 hours Results for orders placed during the hospital encounter of 10/18/11  SURGICAL PCR SCREEN     Status: Abnormal   Collection Time   10/18/11 10:15 AM      Component Value Range Status Comment   MRSA, PCR POSITIVE (*) NEGATIVE Final    Staphylococcus aureus POSITIVE (*) NEGATIVE Final     Studies/Results: No results found.  Assessment/Plan: Continue foley due to post operative bladder healing.  PLAN: Re-ck orthostatic BP today, and note whether  symptomatic             Shower, and watch for symptomatic low BP in hot water             D/c planning for AM. Dressing change q day.    LOS: 4 days   Oluwafemi Villella I 10/31/2011, 8:17 AM

## 2011-11-01 LAB — HEMOGLOBIN AND HEMATOCRIT, BLOOD: Hemoglobin: 8.4 g/dL — ABNORMAL LOW (ref 13.0–17.0)

## 2011-11-01 MED ORDER — TRIMETHOPRIM 100 MG PO TABS: 100.0000 mg | ORAL_TABLET | ORAL | Status: DC

## 2011-11-01 MED ORDER — BACITRACIN-NEOMYCIN-POLYMYXIN 400-5-5000 EX OINT: 1.0000 "application " | TOPICAL_OINTMENT | Freq: Three times a day (TID) | CUTANEOUS | Status: DC | PRN

## 2011-11-01 MED ORDER — TAPENTADOL HCL 100 MG PO TABS: 100.0000 mg | ORAL_TABLET | Freq: Four times a day (QID) | ORAL | Status: DC | PRN

## 2011-11-01 MED ORDER — URELLE 81 MG PO TABS: 1.0000 | ORAL_TABLET | Freq: Three times a day (TID) | ORAL | Status: DC

## 2011-11-01 NOTE — Progress Notes (Signed)
Dressing changed and staples intact, abd and 4 x4 applied to subrapubic site tega derm over both sites, iv d/c'd, foley changed to leg bag dn instructed on leg bag use and how to use foley bag at night. Discharge instructions given and voiced understanding.d/c to home with daughter.

## 2011-11-01 NOTE — Discharge Summary (Signed)
Physician Discharge Summary  Patient ID: Craig Waters MRN: [id number] DOB/AGE: [birth year] 76 y.o.  Admit date: 10/27/2011 Discharge date: 11/01/2011  Admission Diagnoses: BPH  Discharge Diagnoses:  BPH  Discharged Condition: good  Hospital Course: Open prostatectomy  Consults: None  Significant Diagnostic Studies: labs: H/H  Treatments: Surgery for open prostatectomy  Discharge Exam: Blood pressure 131/62, pulse 81, temperature 98.9 F (37.2 C), temperature source Oral, resp. rate 18, height 5\' 10"  (1.778 m), weight 102.6 kg (226 lb 3.1 oz), SpO2 94.00%. General appearance: alert, cooperative, appears stated age and no distress  Disposition:   Discharge Orders    Future Orders Please Complete By Expires   Diet - low sodium heart healthy      Care order/instruction      Comments:   Change dressing to Tegederm wound dressing.   Increase activity slowly      Discharge instructions      Comments:   Post transurethral resection of the prostate (TURP) instructions/ open prostatectomy instructions:   Your recent prostate surgery requires very special post hospital care.  The prostate incision is quite raw and is covered with a scab to promote healing and prevent bleeding. Certain cautions are needed to assure that the scab is not disturbed of the next 2-3 weeks while the healing proceeds.  Because the raw surface in your prostate fossa ( opening) and the irritating effects of urine, and the catheter, you may expect a sensation of frequency of urination and/or urgency (a stronger desire to urinate) and perhaps even getting up at night more often. ( spasm) This will usually resolve or improve slowly over the healing period. You may see some blood in your urine over the first 6 weeks. Do not be alarmed, even if the urine was clear for a while. Get off your feet and drink lots of fluids until clearing occurs. You may pass some pass clots, and if they don't pass on their own,  or don't  improve call us.  Diet:  You may return to your normal diet immediately. Because of the raw surface of your bladder, alcohol, spicy foods that  are high in acid and drinks with caffeine may cause irritation or frequency and should be used in moderation. To keep your urine flowing freely and avoid constipation, drink plenty of fluids during the day (8-10 glasses). Tip: Avoid cranberry juice because it is very acidic.  Activity:  Your physical activity  needs to be restricted to walking. No tractor or lawnmower riding. However, if you are very active, you may see some blood in the urine. We suggest that you reduce your activity under the circumstances until the bleeding has stopped.  Bowels:  It is important to keep your bowels regular during the postoperative period. Straining with bowel movements can cause bleeding. A bowel movement every other day is reasonable. Use a mild laxative if needed, such as milk of magnesia 2-3 tablespoons, or 2 Dulcolax tablets. Call if you continue to have problems. If you had been taking narcotics for pain, before, during or after your surgery, you may be constipated. Take a laxative if necessary.  Medication:  You should resume your pre-surgery medications unless told not to. In addition you may be given an antibiotic to prevent or treat infection. Antibiotics are not always necessary. All medication should be taken as prescribed until the bottles are finished unless you are having an unusual reaction to one of the drugs.     Problems you should report to  [place]:  a. Fever greater than 101F. b. Heavy bleeding, or clots (see notes above about blood in urine). c. Inability for urine to pass through the catheter. d. Drug reactions (hives, rash, nausea, vomiting, diarrhea). e. Severe burning or pain with urination that is not improving.  f. Note The top tube is called a " suprapubic catheter" and is a safety catheter-temporarily plugged off, to be used after the   urethral catheter comes out and you are undergoing bladder re-training.   Leave dressing on - Keep it clean, dry, and intact until clinic visit      Comments:   tegaderm change in hospital per Nursing prior to discharge, after AM shower.   Discontinue IV        Medication List  As of 11/01/2011  8:35 AM   STOP taking these medications         doxazosin 8 MG 24 hr tablet         TAKE these medications         ibuprofen 200 MG tablet   Commonly known as: ADVIL,MOTRIN   Take 400 mg by mouth every 6 (six) hours as needed. pain      neomycin-bacitracin-polymyxin ointment   Commonly known as: NEOSPORIN   Apply 1 application topically 3 (three) times daily as needed (catheter irritation). apply to eye      Tapentadol HCl 100 MG Tabs   Take 1 tablet (100 mg total) by mouth 4 (four) times daily as needed.      trimethoprim 100 MG tablet   Commonly known as: TRIMPEX   Take 1 tablet (100 mg total) by mouth 1 day or 1 dose.      URELLE 81 MG Tabs   Take 1 tablet (81 mg total) by mouth 3 (three) times daily.           Follow-up Information    Follow up with Jethro Bolus I, MD. (per appointment)    Contact information:   [address] [place], [address] [facility] [place] [address] [phone]          Signed: Jethro Bolus I 11/01/2011, 8:35 AM

## 2011-11-07 NOTE — OR Nursing (Signed)
Dosage corrected, Jannett Celestine RN

## 2011-11-08 ENCOUNTER — Encounter (HOSPITAL_COMMUNITY): Payer: Self-pay

## 2011-11-08 ENCOUNTER — Inpatient Hospital Stay (HOSPITAL_COMMUNITY): Payer: Medicare Other

## 2011-11-08 ENCOUNTER — Emergency Department (HOSPITAL_COMMUNITY): Payer: Medicare Other

## 2011-11-08 ENCOUNTER — Inpatient Hospital Stay (HOSPITAL_COMMUNITY)
Admission: EM | Admit: 2011-11-08 | Discharge: 2011-11-12 | DRG: 689 | Disposition: A | Discharge status: Home / Self Care | Payer: Medicare Other | Attending: Internal Medicine | Admitting: Internal Medicine

## 2011-11-08 DIAGNOSIS — G9340 Encephalopathy, unspecified: Secondary | ICD-10-CM

## 2011-11-08 DIAGNOSIS — R441 Visual hallucinations: Secondary | ICD-10-CM

## 2011-11-08 DIAGNOSIS — Z9079 Acquired absence of other genital organ(s): Secondary | ICD-10-CM

## 2011-11-08 DIAGNOSIS — N390 Urinary tract infection, site not specified: Principal | ICD-10-CM

## 2011-11-08 DIAGNOSIS — R531 Weakness: Secondary | ICD-10-CM

## 2011-11-08 DIAGNOSIS — G92 Toxic encephalopathy: Secondary | ICD-10-CM | POA: Diagnosis present

## 2011-11-08 DIAGNOSIS — R110 Nausea: Secondary | ICD-10-CM

## 2011-11-08 DIAGNOSIS — J40 Bronchitis, not specified as acute or chronic: Secondary | ICD-10-CM

## 2011-11-08 DIAGNOSIS — R4182 Altered mental status, unspecified: Secondary | ICD-10-CM

## 2011-11-08 DIAGNOSIS — E876 Hypokalemia: Secondary | ICD-10-CM

## 2011-11-08 DIAGNOSIS — T398X5A Adverse effect of other nonopioid analgesics and antipyretics, not elsewhere classified, initial encounter: Secondary | ICD-10-CM | POA: Diagnosis present

## 2011-11-08 DIAGNOSIS — H409 Unspecified glaucoma: Secondary | ICD-10-CM | POA: Diagnosis present

## 2011-11-08 DIAGNOSIS — I10 Essential (primary) hypertension: Secondary | ICD-10-CM

## 2011-11-08 DIAGNOSIS — D649 Anemia, unspecified: Secondary | ICD-10-CM

## 2011-11-08 DIAGNOSIS — G929 Unspecified toxic encephalopathy: Secondary | ICD-10-CM | POA: Diagnosis present

## 2011-11-08 DIAGNOSIS — K219 Gastro-esophageal reflux disease without esophagitis: Secondary | ICD-10-CM

## 2011-11-08 DIAGNOSIS — N400 Enlarged prostate without lower urinary tract symptoms: Secondary | ICD-10-CM

## 2011-11-08 LAB — COMPREHENSIVE METABOLIC PANEL
AST: 12 U/L (ref 0–37)
Albumin: 2.9 g/dL — ABNORMAL LOW (ref 3.5–5.2)
BUN: 13 mg/dL (ref 6–23)
Calcium: 8.9 mg/dL (ref 8.4–10.5)
Chloride: 103 mEq/L (ref 96–112)
Creatinine, Ser: 1.03 mg/dL (ref 0.50–1.35)
Total Bilirubin: 0.4 mg/dL (ref 0.3–1.2)

## 2011-11-08 LAB — CBC WITH DIFFERENTIAL/PLATELET
Basophils Absolute: 0 10*3/uL (ref 0.0–0.1)
Basophils Relative: 0 % (ref 0–1)
Eosinophils Absolute: 0.1 10*3/uL (ref 0.0–0.7)
Eosinophils Relative: 1 % (ref 0–5)
HCT: 30.3 % — ABNORMAL LOW (ref 39.0–52.0)
Hemoglobin: 10 g/dL — ABNORMAL LOW (ref 13.0–17.0)
MCH: 30.4 pg (ref 26.0–34.0)
MCHC: 33 g/dL (ref 30.0–36.0)
MCV: 92.1 fL (ref 78.0–100.0)
Monocytes Absolute: 0.6 10*3/uL (ref 0.1–1.0)
Monocytes Relative: 7 % (ref 3–12)
RDW: 13.1 % (ref 11.5–15.5)

## 2011-11-08 LAB — URINALYSIS, ROUTINE W REFLEX MICROSCOPIC
Bilirubin Urine: NEGATIVE
Glucose, UA: NEGATIVE mg/dL
Protein, ur: 100 mg/dL — AB
Specific Gravity, Urine: 1.022 (ref 1.005–1.030)
Urobilinogen, UA: 1 mg/dL (ref 0.0–1.0)

## 2011-11-08 LAB — URINE MICROSCOPIC-ADD ON

## 2011-11-08 MED ORDER — SODIUM CHLORIDE 0.9 % IV SOLN
INTRAVENOUS | Status: AC
  Administered 2011-11-09 (×2): via INTRAVENOUS

## 2011-11-08 MED ORDER — URELLE 81 MG PO TABS
1.0000 | ORAL_TABLET | Freq: Three times a day (TID) | ORAL | Status: DC
  Administered 2011-11-09 – 2011-11-12 (×12): 81 mg via ORAL
  Filled 2011-11-08 (×16): qty 1

## 2011-11-08 MED ORDER — ONDANSETRON HCL 4 MG/2ML IJ SOLN: 4.0000 mg | Freq: Four times a day (QID) | INTRAMUSCULAR | Status: AC | PRN

## 2011-11-08 MED ORDER — SODIUM CHLORIDE 0.9 % IJ SOLN
3.0000 mL | Freq: Two times a day (BID) | INTRAMUSCULAR | Status: DC
  Administered 2011-11-11 – 2011-11-12 (×2): 3 mL via INTRAVENOUS

## 2011-11-08 MED ORDER — SODIUM CHLORIDE 0.9 % IV SOLN
1000.0000 mL | INTRAVENOUS | Status: DC
  Administered 2011-11-08: 1000 mL via INTRAVENOUS

## 2011-11-08 MED ORDER — BACITRACIN-NEOMYCIN-POLYMYXIN 400-5-5000 EX OINT: 1.0000 "application " | TOPICAL_OINTMENT | Freq: Three times a day (TID) | CUTANEOUS | Status: DC | PRN

## 2011-11-08 MED ORDER — SODIUM CHLORIDE 0.9 % IV SOLN
1000.0000 mL | Freq: Once | INTRAVENOUS | Status: DC
  Administered 2011-11-08: 1000 mL via INTRAVENOUS

## 2011-11-08 MED ORDER — SODIUM CHLORIDE 0.9 % IV SOLN: INTRAVENOUS | Status: DC

## 2011-11-08 MED ORDER — DEXTROSE 5 % IV SOLN
1.0000 g | Freq: Once | INTRAVENOUS | Status: AC
  Administered 2011-11-08: 1 g via INTRAVENOUS
  Filled 2011-11-08: qty 10

## 2011-11-08 MED ORDER — URELLE 81 MG PO TABS: 1.0000 | ORAL_TABLET | Freq: Three times a day (TID) | ORAL | Status: DC

## 2011-11-08 MED ORDER — ONDANSETRON HCL 4 MG/2ML IJ SOLN
4.0000 mg | Freq: Once | INTRAMUSCULAR | Status: AC
  Administered 2011-11-08: 4 mg via INTRAVENOUS
  Filled 2011-11-08: qty 2

## 2011-11-08 NOTE — ED Notes (Signed)
Patient reports that he had a polyp removed from the prostate (size of a watermelon) on 10/27/11. Patient has had gradual swelling of lower abdomen. patient also c/o "just not feeling well." and weakness. Patient states he also had hallucinations yesterday and called the NP and they were told to stop Nucynta and just take Tylenol. Patient denies urinary retention or hesitancy. Patient is also having a productive cough with white sputum.

## 2011-11-08 NOTE — ED Provider Notes (Signed)
History     CSN: [id number]  Arrival date & time 11/08/11  1717   First MD Initiated Contact with Patient 11/08/11 1918      Chief Complaint  Patient presents with  . Weakness  . Cough    (Consider location/radiation/quality/duration/timing/severity/associated sxs/prior treatment) HPI  Patient relates he had a suprapubic prostatectomy done July 5 and was discharged from the hospital on the 10th. He relates he has swelling in his lower abdomen. He also was felt to have a fever today however his temperature was not taken. He he was given Tylenol at 2:30 PM. He relates he's been having soft stools. He also started coughing today. He has had nausea without vomiting that started yesterday. His family also reports he's been having a lot of visual hallucinations the past 2 days, and mainly seeing people that aren't there, accusing people of eating his food, or hearing people at the door the wife there.   PCP Dr Jeannetta Nap Urologist Dr Patsi Sears   Past Medical History  Diagnosis Date  . Blood in stool   . Diverticulitis   . GERD (gastroesophageal reflux disease)   . Glaucoma   . Ulcer   . History of transfusion of whole blood   . Prostate disorder   . Shortness of breath 10-18-11    with exertion, cardiac cath done 7-8 yrs ago negative.  . Vertigo 10-18-11    inner ear issues occ. -tx. Bonine as needed  . Arthritis 10-18-11    back, fingers  . H/O hiatal hernia 10-18-11    noted on recent CXR    Past Surgical History  Procedure Date  . Gastric ulcer 10-18-11    '91-blleding ulcer with blood transfusions after  . Appendectomy 10-18-11    age 58  . Cataract extraction, bilateral 10-18-11    bilateral   . Eye surgery 10-18-11    laser eye surgery s/p cataract bilaterally  . Cardiac catheterization 10-18-11    7-8 yrs ago-mild blockage,no tx. indicated  . Prostatectomy 10/27/2011    Procedure: PROSTATECTOMY SUPRAPUBIC;  Surgeon: Kathi Ludwig, MD;  Location: [facility];  Service:  Urology;  Laterality: N/A;  Placement of suprapubic tube  . Cystoscopy 10/27/2011    Procedure: CYSTOSCOPY FLEXIBLE;  Surgeon: Kathi Ludwig, MD;  Location: [facility];  Service: Urology;  Laterality: N/A;    Family History  Problem Relation Age of Onset  . Cancer Sister     breast    History  Substance Use Topics  . Smoking status: Former Smoker -- 1.0 packs/day for 5 years    Types: Cigarettes    Quit date: 09/26/1978  . Smokeless tobacco: Current User    Types: Chew  . Alcohol Use: No  lives with wife and daughter Lives at home   Review of Systems  All other systems reviewed and are negative.    Allergies  Review of patient's allergies indicates no known allergies.  Home Medications   Current Outpatient Rx  Name Route Sig Dispense Refill  . IBUPROFEN 200 MG PO TABS Oral Take 400 mg by mouth every 6 (six) hours as needed. pain    . BACITRACIN-NEOMYCIN-POLYMYXIN [id number] EX OINT Topical Apply 1 application topically 3 (three) times daily as needed. apply to eye    . TRIMETHOPRIM 100 MG PO TABS Oral Take 100 mg by mouth daily.    Marland Kitchen URELLE 81 MG PO TABS Oral Take 1 tablet by mouth 3 (three) times daily.      BP 158/67  Pulse 80  Temp 99.4 F (37.4 C)  Resp 18  SpO2 94%  Vital signs normal    Physical Exam  Nursing note and vitals reviewed. Constitutional: He is oriented to person, place, and time. He appears well-developed and well-nourished.  Non-toxic appearance. He does not appear ill. No distress.  HENT:  Head: Normocephalic and atraumatic.  Right Ear: External ear normal.  Left Ear: External ear normal.  Nose: Nose normal. No mucosal edema or rhinorrhea.  Mouth/Throat: Oropharynx is clear and moist and mucous membranes are normal. No dental abscesses or uvula swelling.  Eyes: Conjunctivae and EOM are normal. Pupils are equal, round, and reactive to light.  Neck: Normal range of motion and full passive range of motion without pain. Neck supple.    Cardiovascular: Normal rate, regular rhythm and normal heart sounds.  Exam reveals no gallop and no friction rub.   No murmur heard. Pulmonary/Chest: Effort normal and breath sounds normal. No respiratory distress. He has no wheezes. He has no rhonchi. He has no rales. He exhibits no tenderness and no crepitus.       Coughing frequently  Abdominal: Soft. Normal appearance and bowel sounds are normal. He exhibits no distension. There is no tenderness. There is no rebound and no guarding.       Patient original dressing from when he left the hospital is intact and in place. There is no staining of the cause or abdominal pads. He has mild swelling however it appears to be consistent with recent surgery.  Musculoskeletal: Normal range of motion. He exhibits no edema and no tenderness.       Moves all extremities well.   Neurological: He is alert and oriented to person, place, and time. He has normal strength. No cranial nerve deficit.  Skin: Skin is warm, dry and intact. No rash noted. No erythema. No pallor.  Psychiatric: He has a normal mood and affect. His speech is normal and behavior is normal. His mood appears not anxious.    ED Course  Procedures (including critical care time)   Medications  0.9 %  sodium chloride infusion (0 mL Intravenous Stopped 11/08/11 2228)    Followed by  0.9 %  sodium chloride infusion (1000 mL Intravenous New Bag/Given 11/08/11 2218)  cefTRIAXone (ROCEPHIN) 1 g in dextrose 5 % 50 mL IVPB (1 g Intravenous Given 11/08/11 2218)  ondansetron (ZOFRAN) injection 4 mg (4 mg Intravenous Given 11/08/11 2000)   20:40 Dr Selena Batten admit to med-surg team 8   Results for orders placed during the hospital encounter of 11/08/11  URINALYSIS, ROUTINE W REFLEX MICROSCOPIC      Component Value Range   Color, Urine GREEN (*) YELLOW   APPearance TURBID (*) CLEAR   Specific Gravity, Urine 1.022  1.005 - 1.030   pH 6.5  5.0 - 8.0   Glucose, UA NEGATIVE  NEGATIVE mg/dL   Hgb urine  dipstick LARGE (*) NEGATIVE   Bilirubin Urine NEGATIVE  NEGATIVE   Ketones, ur 15 (*) NEGATIVE mg/dL   Protein, ur 469 (*) NEGATIVE mg/dL   Urobilinogen, UA 1.0  0.0 - 1.0 mg/dL   Nitrite NEGATIVE  NEGATIVE   Leukocytes, UA LARGE (*) NEGATIVE  URINE MICROSCOPIC-ADD ON      Component Value Range   WBC, UA TOO NUMEROUS TO COUNT  <3 WBC/hpf   RBC / HPF 11-20  <3 RBC/hpf   Bacteria, UA FEW (*) RARE  CBC WITH DIFFERENTIAL      Component Value Range  WBC 8.4  4.0 - 10.5 K/uL   RBC 3.29 (*) 4.22 - 5.81 MIL/uL   Hemoglobin 10.0 (*) 13.0 - 17.0 g/dL   HCT 95.6 (*) 21.3 - 08.6 %   MCV 92.1  78.0 - 100.0 fL   MCH 30.4  26.0 - 34.0 pg   MCHC 33.0  30.0 - 36.0 g/dL   RDW 57.8  46.9 - 62.9 %   Platelets 408 (*) 150 - 400 K/uL   Neutrophils Relative 81 (*) 43 - 77 %   Neutro Abs 6.8  1.7 - 7.7 K/uL   Lymphocytes Relative 11 (*) 12 - 46 %   Lymphs Abs 0.9  0.7 - 4.0 K/uL   Monocytes Relative 7  3 - 12 %   Monocytes Absolute 0.6  0.1 - 1.0 K/uL   Eosinophils Relative 1  0 - 5 %   Eosinophils Absolute 0.1  0.0 - 0.7 K/uL   Basophils Relative 0  0 - 1 %   Basophils Absolute 0.0  0.0 - 0.1 K/uL  COMPREHENSIVE METABOLIC PANEL      Component Value Range   Sodium 139  135 - 145 mEq/L   Potassium 3.8  3.5 - 5.1 mEq/L   Chloride 103  96 - 112 mEq/L   CO2 26  19 - 32 mEq/L   Glucose, Bld 114 (*) 70 - 99 mg/dL   BUN 13  6 - 23 mg/dL   Creatinine, Ser 5.28  0.50 - 1.35 mg/dL   Calcium 8.9  8.4 - 41.3 mg/dL   Total Protein 6.4  6.0 - 8.3 g/dL   Albumin 2.9 (*) 3.5 - 5.2 g/dL   AST 12  0 - 37 U/L   ALT 10  0 - 53 U/L   Alkaline Phosphatase 86  39 - 117 U/L   Total Bilirubin 0.4  0.3 - 1.2 mg/dL   GFR calc non Af Amer 67 (*) >90 mL/min   GFR calc Af Amer 78 (*) >90 mL/min   Laboratory interpretation all normal except anemia, urinary tract infection   Dg Chest Portable 1 View  11/08/2011  *RADIOLOGY REPORT*  Clinical Data: Cough and weakness  PORTABLE CHEST - 1 VIEW  Comparison: None.   Findings: Cardiac leads obscure detail.  Elevated right hemidiaphragm noted.  Heart size upper limits of normal.  Lungs are clear.  No pleural effusion.  No acute osseous finding.  IMPRESSION: No acute cardiopulmonary process.  Original Report Authenticated By: Harrel Lemon, M.D.     Date: 11/08/2011  Rate: 80  Rhythm: normal sinus rhythm and premature atrial contractions (PAC)  QRS Axis: normal  Intervals: normal  ST/T Wave abnormalities: normal  Conduction Disutrbances:none  Narrative Interpretation:   Old EKG Reviewed: none available      1. Urinary tract infection   2. Nausea   3. Weakness   4. Anemia   5. Bronchitis   6. Hallucinations, visual    Plan admission  Devoria Albe, MD, FACEP   MDM          Ward Givens, MD 11/08/11 2245

## 2011-11-08 NOTE — ED Notes (Addendum)
Pt c/o generalized weakness, cough, swelling due to prostatectomy (done 10/27/11), nausea, and hallucinations x1 day.  Daughter states "he looks pallor".

## 2011-11-08 NOTE — ED Notes (Signed)
Report received from previous RN.

## 2011-11-08 NOTE — H&P (Addendum)
Craig Waters is an 78 y.o. male.   Chief Complaint: altered ms HPI: 78 yo male with recent prostatectomy was c/o fever, nausea, hallucinating, (visual, seeing orange tractors, and cakes)  and called Dr. Baxter Flattery office and told to stop nucynta, and then came to ED. In the ED, found to have Uti. (on Trimethoprim) at baseline.  CXR negative for any acute process.  CT brain pending. Pt will be admitted for altered ms, likely secondary to nucynta and uti.    Past Medical History  Diagnosis Date  . Blood in stool   . Diverticulitis   . GERD (gastroesophageal reflux disease)   . Glaucoma   . Ulcer   . History of transfusion of whole blood   . Prostate disorder   . Shortness of breath 10-18-11    with exertion, cardiac cath done 7-8 yrs ago negative.  . Vertigo 10-18-11    inner ear issues occ. -tx. Bonine as needed  . Arthritis 10-18-11    back, fingers  . H/O hiatal hernia 10-18-11    noted on recent CXR    Past Surgical History  Procedure Date  . Gastric ulcer 10-18-11    '91-blleding ulcer with blood transfusions after  . Appendectomy 10-18-11    age 54  . Cataract extraction, bilateral 10-18-11    bilateral   . Eye surgery 10-18-11    laser eye surgery s/p cataract bilaterally  . Cardiac catheterization 10-18-11    7-8 yrs ago-mild blockage,no tx. indicated  . Prostatectomy 10/27/2011    Procedure: PROSTATECTOMY SUPRAPUBIC;  Surgeon: Kathi Ludwig, MD;  Location: [facility];  Service: Urology;  Laterality: N/A;  Placement of suprapubic tube  . Cystoscopy 10/27/2011    Procedure: CYSTOSCOPY FLEXIBLE;  Surgeon: Kathi Ludwig, MD;  Location: [facility];  Service: Urology;  Laterality: N/A;    Family History  Problem Relation Age of Onset  . Cancer Sister     breast  . Dementia Mother   . Heart attack Father    Social History:  reports that he quit smoking about 33 years ago. His smoking use included Cigarettes. He has a 5 pack-year smoking history. He quit smokeless tobacco  use 7 days ago. His smokeless tobacco use included Chew. He reports that he does not drink alcohol or use illicit drugs.  Allergies: No Known Allergies   (Not in a hospital admission) Home Medications:  See med rec  Results for orders placed during the hospital encounter of 11/08/11 (from the past 48 hour(s))  URINALYSIS, ROUTINE W REFLEX MICROSCOPIC     Status: Abnormal   Collection Time   11/08/11  6:39 PM      Component Value Range Comment   Color, Urine GREEN (*) YELLOW BIOCHEMICALS MAY BE AFFECTED BY COLOR   APPearance TURBID (*) CLEAR    Specific Gravity, Urine 1.022  1.005 - 1.030    pH 6.5  5.0 - 8.0    Glucose, UA NEGATIVE  NEGATIVE mg/dL    Hgb urine dipstick LARGE (*) NEGATIVE    Bilirubin Urine NEGATIVE  NEGATIVE    Ketones, ur 15 (*) NEGATIVE mg/dL    Protein, ur 478 (*) NEGATIVE mg/dL    Urobilinogen, UA 1.0  0.0 - 1.0 mg/dL    Nitrite NEGATIVE  NEGATIVE    Leukocytes, UA LARGE (*) NEGATIVE   URINE MICROSCOPIC-ADD ON     Status: Abnormal   Collection Time   11/08/11  6:39 PM      Component Value  Range Comment   WBC, UA TOO NUMEROUS TO COUNT  <3 WBC/hpf    RBC / HPF 11-20  <3 RBC/hpf    Bacteria, UA FEW (*) RARE   CBC WITH DIFFERENTIAL     Status: Abnormal   Collection Time   11/08/11  7:30 PM      Component Value Range Comment   WBC 8.4  4.0 - 10.5 K/uL    RBC 3.29 (*) 4.22 - 5.81 MIL/uL    Hemoglobin 10.0 (*) 13.0 - 17.0 g/dL    HCT 16.1 (*) 09.6 - 52.0 %    MCV 92.1  78.0 - 100.0 fL    MCH 30.4  26.0 - 34.0 pg    MCHC 33.0  30.0 - 36.0 g/dL    RDW 04.5  40.9 - 81.1 %    Platelets 408 (*) 150 - 400 K/uL    Neutrophils Relative 81 (*) 43 - 77 %    Neutro Abs 6.8  1.7 - 7.7 K/uL    Lymphocytes Relative 11 (*) 12 - 46 %    Lymphs Abs 0.9  0.7 - 4.0 K/uL    Monocytes Relative 7  3 - 12 %    Monocytes Absolute 0.6  0.1 - 1.0 K/uL    Eosinophils Relative 1  0 - 5 %    Eosinophils Absolute 0.1  0.0 - 0.7 K/uL    Basophils Relative 0  0 - 1 %    Basophils  Absolute 0.0  0.0 - 0.1 K/uL   COMPREHENSIVE METABOLIC PANEL     Status: Abnormal   Collection Time   11/08/11  7:30 PM      Component Value Range Comment   Sodium 139  135 - 145 mEq/L    Potassium 3.8  3.5 - 5.1 mEq/L    Chloride 103  96 - 112 mEq/L    CO2 26  19 - 32 mEq/L    Glucose, Bld 114 (*) 70 - 99 mg/dL    BUN 13  6 - 23 mg/dL    Creatinine, Ser 9.14  0.50 - 1.35 mg/dL    Calcium 8.9  8.4 - 78.2 mg/dL    Total Protein 6.4  6.0 - 8.3 g/dL    Albumin 2.9 (*) 3.5 - 5.2 g/dL    AST 12  0 - 37 U/L    ALT 10  0 - 53 U/L    Alkaline Phosphatase 86  39 - 117 U/L    Total Bilirubin 0.4  0.3 - 1.2 mg/dL    GFR calc non Af Amer 67 (*) >90 mL/min    GFR calc Af Amer 78 (*) >90 mL/min    Dg Chest Portable 1 View  11/08/2011  *RADIOLOGY REPORT*  Clinical Data: Cough and weakness  PORTABLE CHEST - 1 VIEW  Comparison: None.  Findings: Cardiac leads obscure detail.  Elevated right hemidiaphragm noted.  Heart size upper limits of normal.  Lungs are clear.  No pleural effusion.  No acute osseous finding.  IMPRESSION: No acute cardiopulmonary process.  Original Report Authenticated By: Harrel Lemon, M.D.    Review of Systems  Constitutional: Positive for fever. Negative for chills, weight loss, malaise/fatigue and diaphoresis.  HENT: Negative for hearing loss, ear pain, nosebleeds, congestion, neck pain, tinnitus and ear discharge.   Eyes: Negative for blurred vision, double vision, photophobia, pain, discharge and redness.  Respiratory: Negative for cough, hemoptysis, sputum production, shortness of breath and wheezing.   Cardiovascular: Negative for chest  pain, palpitations, orthopnea, claudication, leg swelling and PND.  Gastrointestinal: Positive for nausea. Negative for heartburn, vomiting, abdominal pain, diarrhea, constipation, blood in stool and melena.  Genitourinary: Negative for dysuria, urgency, frequency, hematuria and flank pain.  Musculoskeletal: Negative for myalgias, back  pain, joint pain and falls.  Skin: Negative for itching and rash.  Neurological: Positive for headaches. Negative for dizziness, tingling, tremors, sensory change, speech change, focal weakness, seizures, loss of consciousness and weakness.  Endo/Heme/Allergies: Negative for environmental allergies and polydipsia. Does not bruise/bleed easily.  Psychiatric/Behavioral: Positive for hallucinations. Negative for depression, suicidal ideas and substance abuse. The patient is not nervous/anxious and does not have insomnia.     Blood pressure 158/67, pulse 80, temperature 99.4 F (37.4 C), resp. rate 18, SpO2 94.00%. Physical Exam  Constitutional: He is oriented to person, place, and time. He appears well-developed and well-nourished. No distress.  HENT:  Head: Normocephalic and atraumatic.  Mouth/Throat: No oropharyngeal exudate.  Eyes: Conjunctivae and EOM are normal. Pupils are equal, round, and reactive to light. Right eye exhibits no discharge. Left eye exhibits no discharge. No scleral icterus.  Neck: Normal range of motion. Neck supple. No JVD present. No tracheal deviation present. No thyromegaly present.  Cardiovascular: Normal rate and regular rhythm.  Exam reveals no gallop and no friction rub.   No murmur heard. Respiratory: Effort normal and breath sounds normal. No stridor. No respiratory distress. He has no wheezes. He has no rales. He exhibits no tenderness.  GI: Soft. Bowel sounds are normal. He exhibits no distension and no mass. There is no tenderness. There is no rebound and no guarding.  Musculoskeletal: Normal range of motion. He exhibits no edema and no tenderness.  Lymphadenopathy:    He has no cervical adenopathy.  Neurological: He is alert and oriented to person, place, and time. He has normal reflexes. He displays normal reflexes. No cranial nerve deficit. He exhibits normal muscle tone. Coordination normal.  Skin: Skin is warm and dry. No rash noted. He is not  diaphoretic. No erythema. No pallor.  Psychiatric: He has a normal mood and affect. His behavior is normal. Judgment and thought content normal.     Assessment/Plan AMS: likely secondary to nucynta as well as possibly uti Check b12, folate, esr, rpr, ana, tsh CT brain  Uti: start on Rocephin 1gm iv qday  Bph, ?H/o prostatectomy Contact urology in am please  Hypertension uncontrolled,  Hydralazine prn      Pearson Grippe 11/08/2011, 11:04 PM

## 2011-11-09 ENCOUNTER — Inpatient Hospital Stay (HOSPITAL_COMMUNITY): Payer: Medicare Other

## 2011-11-09 DIAGNOSIS — I10 Essential (primary) hypertension: Secondary | ICD-10-CM

## 2011-11-09 DIAGNOSIS — G9340 Encephalopathy, unspecified: Secondary | ICD-10-CM

## 2011-11-09 LAB — RPR: RPR Ser Ql: NONREACTIVE

## 2011-11-09 LAB — SEDIMENTATION RATE: Sed Rate: 36 mm/hr — ABNORMAL HIGH (ref 0–16)

## 2011-11-09 LAB — MRSA PCR SCREENING: MRSA by PCR: NEGATIVE

## 2011-11-09 MED ORDER — DEXTROSE 5 % IV SOLN
1.0000 g | INTRAVENOUS | Status: DC
  Administered 2011-11-09 – 2011-11-10 (×2): 1 g via INTRAVENOUS
  Filled 2011-11-09 (×3): qty 10

## 2011-11-09 MED ORDER — PANTOPRAZOLE SODIUM 40 MG PO TBEC
40.0000 mg | DELAYED_RELEASE_TABLET | Freq: Every day | ORAL | Status: DC
  Administered 2011-11-09 – 2011-11-12 (×4): 40 mg via ORAL
  Filled 2011-11-09 (×5): qty 1

## 2011-11-09 MED ORDER — PANTOPRAZOLE SODIUM 40 MG PO TBEC
DELAYED_RELEASE_TABLET | ORAL | Status: AC
  Filled 2011-11-09: qty 1

## 2011-11-09 NOTE — Progress Notes (Signed)
[facility] PROGRESS NOTE  Craig Waters ZOX:[id number] DOB: 1930/09/15 DOA: 11/08/2011 PCP: Kristian Covey, MD  Assessment/Plan: Principal Problem:  *Encephalopathy acute Active Problems:  BPH (benign prostatic hyperplasia)  GERD (gastroesophageal reflux disease)  UTI (lower urinary tract infection)  Altered mental status  HTN (hypertension)  #1 acute encephalopathy Likely secondary to nucynta in the setting of probable urinary tract infection. Urine cultures are pending. CT of the head was negative. B12 levels are 131. RPR is nonreactive. Sedimentation rate is 36. TSH is 0.344. Patient with clinical improvement. Nucynta is on hold. Await urine cultures. Continue empiric IV Rocephin day #2/7.  #2 urinary tract infection Urine cultures are pending. Continue IV Rocephin day #2/7.  #3 hypertension Follow BP. If continues to be elevated may consider starting patient on low-dose antihypertensive medication.  #4 gastroesophageal reflux disease We'll place on a PPI.  #5 BPH/status post recent prostatectomy Have contacted urology and spoke with Dr. Brunilda Payor who knows of patient admission and feels there is nothing from this standpoint to do at this time and to followup as outpatient. It was recommended that if urine cultures are positive then treat patient for approximately 7 days of antibiotics.  #6 prophylaxis PPI for GI prophylaxis. SCDs for DVT prophylaxis.   Code Status: Full Family Communication: Discussed with patient Disposition Plan: Home when medically stable   Brief narrative: 78 yo male with recent prostatectomy was c/o fever, nausea, hallucinating, (visual, seeing orange tractors, and cakes) and called Dr. Baxter Flattery office and told to stop nucynta, and then came to ED. In the ED, found to have Uti. (on Trimethoprim) at baseline. CXR negative for any acute process. CT brain pending. Pt will be admitted for altered ms, likely secondary to nucynta and uti.     Consultants:  None  Procedures:  CT of the head was done on 11/09/2011 that showed no acute intracranial abnormalities.  Antibiotics:  IV Rocephin start date 11/08/2011  HPI/Subjective: Patient laying in bed with no complaints. Patient denies any hallucinations. Patient states he feels much better.  Objective: Filed Vitals:   11/08/11 2315 11/08/11 2321 11/08/11 2344 11/09/11 0538  BP:   155/70 155/67  Pulse: 80  84 73  Temp:  98.2 F (36.8 C) 98.6 F (37 C) 98 F (36.7 C)  TempSrc:  Oral Oral Oral  Resp: 17  18 18   Height:   5\' 10"  (1.778 m)   Weight:    101.606 kg (224 lb)  SpO2: 94%  96% 93%    Intake/Output Summary (Last 24 hours) at 11/09/11 1332 Last data filed at 11/09/11 1255  Gross per 24 hour  Intake 1536.25 ml  Output    900 ml  Net 636.25 ml    Exam: General: Alert, awake, oriented x3, in no acute distress. HEENT: No bruits, no goiter. Heart: Regular rate and rhythm, without murmurs, rubs, gallops. Lungs: Clear to auscultation bilaterally. Abdomen: Soft, nontender, nondistended, positive bowel sounds. Suprapubic catheter in place. Extremities: No clubbing cyanosis or edema with positive pedal pulses. Neuro: Grossly intact, nonfocal.  Data Reviewed: Basic Metabolic Panel:  Lab 11/08/11 8119  NA 139  K 3.8  CL 103  CO2 26  GLUCOSE 114*  BUN 13  CREATININE 1.03  CALCIUM 8.9  MG --  PHOS --   Liver Function Tests:  Lab 11/08/11 1930  AST 12  ALT 10  ALKPHOS 86  BILITOT 0.4  PROT 6.4  ALBUMIN 2.9*   No results found for this basename: LIPASE:5,AMYLASE:5 in the last  168 hours No results found for this basename: AMMONIA:5 in the last 168 hours CBC:  Lab 11/08/11 1930  WBC 8.4  NEUTROABS 6.8  HGB 10.0*  HCT 30.3*  MCV 92.1  PLT 408*   Cardiac Enzymes: No results found for this basename: CKTOTAL:5,CKMB:5,CKMBINDEX:5,TROPONINI:5 in the last 168 hours BNP (last 3 results) No results found for this basename: PROBNP:3 in the  last 8760 hours CBG: No results found for this basename: GLUCAP:5 in the last 168 hours  Recent Results (from the past 240 hour(s))  MRSA PCR SCREENING     Status: Normal   Collection Time   11/08/11 11:59 AM      Component Value Range Status Comment   MRSA by PCR NEGATIVE  NEGATIVE Final      Studies: Ct Head Wo Contrast  11/09/2011  *RADIOLOGY REPORT*  Clinical Data: Altered mental status.  History of urinary tract infection.  CT HEAD WITHOUT CONTRAST  Technique:  Contiguous axial images were obtained from the base of the skull through the vertex without contrast.  Comparison: None.  Findings: Diffuse cerebral atrophy.  Mild ventricular dilatation consistent with central atrophy.  Scattered low attenuation changes in the deep white matter consistent with small vessel ischemia.  No mass effect or midline shift.  No abnormal extra-axial fluid collections.  Gray-white matter junctions are distinct.  Basal cisterns are not effaced.  No evidence of acute intracranial hemorrhage.  Visualized paranasal sinuses and mastoid air cells are not opacified.  No depressed skull fractures.  IMPRESSION: No acute intracranial abnormalities.  Chronic atrophy and small vessel ischemic changes.  Original Report Authenticated By: Marlon Pel, M.D.   Dg Chest Portable 1 View  11/08/2011  *RADIOLOGY REPORT*  Clinical Data: Cough and weakness  PORTABLE CHEST - 1 VIEW  Comparison: None.  Findings: Cardiac leads obscure detail.  Elevated right hemidiaphragm noted.  Heart size upper limits of normal.  Lungs are clear.  No pleural effusion.  No acute osseous finding.  IMPRESSION: No acute cardiopulmonary process.  Original Report Authenticated By: Harrel Lemon, M.D.    Scheduled Meds:   . sodium chloride  1,000 mL Intravenous Once  . cefTRIAXone (ROCEPHIN)  IV  1 g Intravenous Once  . ondansetron (ZOFRAN) IV  4 mg Intravenous Once  . sodium chloride  3 mL Intravenous Q12H  . URELLE  1 tablet Oral TID  .  DISCONTD: URELLE  1 tablet Oral TID   Continuous Infusions:   . sodium chloride 1,000 mL (11/08/11 2218)  . sodium chloride 75 mL/hr at 11/09/11 0633  . DISCONTD: sodium chloride      Principal Problem:  *Encephalopathy acute Active Problems:  BPH (benign prostatic hyperplasia)  GERD (gastroesophageal reflux disease)  UTI (lower urinary tract infection)  Altered mental status  HTN (hypertension)    Time spent: 40 mins    [facility]  [facility] Pager [phone]. If 8PM-8AM, please contact night-coverage at [email or web address], password [facility] 11/09/2011, 1:32 PM  LOS: 1 day

## 2011-11-10 DIAGNOSIS — D649 Anemia, unspecified: Secondary | ICD-10-CM

## 2011-11-10 LAB — CBC
HCT: 29.4 % — ABNORMAL LOW (ref 39.0–52.0)
Hemoglobin: 9.3 g/dL — ABNORMAL LOW (ref 13.0–17.0)
MCV: 91 fL (ref 78.0–100.0)
RDW: 13.2 % (ref 11.5–15.5)
WBC: 7.8 10*3/uL (ref 4.0–10.5)

## 2011-11-10 LAB — BASIC METABOLIC PANEL
CO2: 26 mEq/L (ref 19–32)
Chloride: 105 mEq/L (ref 96–112)
Creatinine, Ser: 0.86 mg/dL (ref 0.50–1.35)

## 2011-11-10 LAB — ANA: Anti Nuclear Antibody(ANA): NEGATIVE

## 2011-11-10 MED ORDER — SODIUM CHLORIDE 0.9 % IV SOLN
1000.0000 mL | INTRAVENOUS | Status: DC
  Administered 2011-11-10: 1000 mL via INTRAVENOUS

## 2011-11-10 MED ORDER — AMLODIPINE BESYLATE 5 MG PO TABS
5.0000 mg | ORAL_TABLET | Freq: Every day | ORAL | Status: DC
  Administered 2011-11-10 – 2011-11-11 (×2): 5 mg via ORAL
  Filled 2011-11-10 (×2): qty 1

## 2011-11-10 NOTE — Evaluation (Signed)
Physical Therapy Evaluation Patient Details Name: Craig Waters MRN: [id number] DOB: 09-Jun-1930 Today's Date: 11/10/2011 Time: 4098-1191 PT Time Calculation (min): 13 min  PT Assessment / Plan / Recommendation Clinical Impression  78 y.o. male admitted with encephalopathy after recent prostatectomy. Pt is independent with mobility, he ambulated 400' without assistive device, no LOB. No PT needs, no f/u nor DME needs.     PT Assessment  Patent does not need any further PT services    Follow Up Recommendations  No PT follow up    Barriers to Discharge        Equipment Recommendations  None recommended by PT    Recommendations for Other Services     Frequency      Precautions / Restrictions Precautions Precautions: None   Pertinent Vitals/Pain **pt denies pain*      Mobility  Bed Mobility Bed Mobility: Rolling Right;Right Sidelying to Sit Rolling Right: 6: Modified independent (Device/Increase time) Right Sidelying to Sit: 6: Modified independent (Device/Increase time) Transfers Sit to Stand: 7: Independent Stand to Sit: 7: Independent Ambulation/Gait Ambulation/Gait Assistance: 7: Independent Ambulation Distance (Feet): 400 Feet Assistive device: None Gait Pattern: Within Functional Limits Stairs: No Wheelchair Mobility Wheelchair Mobility: No    Exercises     PT Diagnosis:    PT Problem List:   PT Treatment Interventions:     PT Goals    Visit Information  Last PT Received On: 11/10/11 Assistance Needed: +1    Subjective Data  Subjective: 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 Communication Communication: No difficulties Dominant Hand: Right    Cognition  Overall Cognitive Status: Appears within functional limits for tasks assessed/performed Arousal/Alertness: Awake/alert Orientation Level: Appears intact for tasks assessed Behavior During Session: [facility] for tasks performed    Extremity/Trunk Assessment Right Upper Extremity Assessment RUE ROM/Strength/Tone: [facility] for tasks assessed Left Upper Extremity Assessment LUE ROM/Strength/Tone: WFL for tasks assessed Right Lower Extremity Assessment RLE ROM/Strength/Tone: WFL for tasks assessed RLE Sensation: WFL - Light Touch RLE Coordination: WFL - gross/fine motor Left Lower Extremity Assessment LLE ROM/Strength/Tone: WFL for tasks assessed LLE Sensation: WFL - Light Touch LLE Coordination: WFL - gross/fine motor Trunk Assessment Trunk Assessment: Normal   Balance Balance Balance Assessed: Yes Dynamic Standing Balance Dynamic Standing - Balance Support: No upper extremity supported;During functional activity Dynamic Standing - Level of Assistance: 7: Independent  End of Session PT - End of Session Equipment Utilized During Treatment: Gait belt Patient left: in chair;with call bell/phone within reach Nurse Communication: Mobility status  GP     Ralene Bathe Kistler 11/10/2011, 12:13 PM [phone]

## 2011-11-10 NOTE — Progress Notes (Signed)
[facility] PROGRESS NOTE  Craig Waters QIH:[id number] DOB: 08-28-1930 DOA: 11/08/2011 PCP: Kristian Covey, MD  Assessment/Plan: Principal Problem:  *Encephalopathy acute Active Problems:  BPH (benign prostatic hyperplasia)  GERD (gastroesophageal reflux disease)  UTI (lower urinary tract infection)  Altered mental status  HTN (hypertension)  #1 acute encephalopathy Likely secondary to nucynta in the setting of probable urinary tract infection. Urine cultures are pending. CT of the head was negative. B12 levels are 131. RPR is nonreactive. Sedimentation rate is 36. TSH is 0.344. Patient with clinical improvement. Nucynta is on hold. Await urine cultures. Continue empiric IV Rocephin day #3/7.  #2 urinary tract infection Urine cultures are pending. Continue IV Rocephin day #3/7.  #3 hypertension Follow BP. Also patient on low-dose Norvasc.   #4 gastroesophageal reflux disease  PPI.  #5 BPH/status post recent prostatectomy Have contacted urology and spoke with Dr. Brunilda Payor who knows of patient admission and feels there is nothing from this standpoint to do at this time and to followup as outpatient. It was recommended that if urine cultures are positive then treat patient for approximately 7 days of antibiotics.  #6 prophylaxis PPI for GI prophylaxis. SCDs for DVT prophylaxis.   Code Status: Full Family Communication: Discussed with patient Disposition Plan: Home when medically stable   Brief narrative: 78 yo male with recent prostatectomy was c/o fever, nausea, hallucinating, (visual, seeing orange tractors, and cakes) and called Dr. Baxter Flattery office and told to stop nucynta, and then came to ED. In the ED, found to have Uti. (on Trimethoprim) at baseline. CXR negative for any acute process. CT brain pending. Pt will be admitted for altered ms, likely secondary to nucynta and uti.    Consultants:  None  Procedures:  CT of the head was done on 11/09/2011 that  showed no acute intracranial abnormalities.  Antibiotics:  IV Rocephin start date 11/08/2011  HPI/Subjective: Patient laying in bed with no complaints. Patient denies any hallucinations. Patient states he feels much better.  Objective: Filed Vitals:   11/09/11 1409 11/09/11 2054 11/10/11 0512 11/10/11 1335  BP: 157/73 155/67 137/67 165/71  Pulse: 85 79 77 86  Temp: 98.7 F (37.1 C) 98.5 F (36.9 C) 98.2 F (36.8 C) 98.1 F (36.7 C)  TempSrc: Oral Oral Oral Oral  Resp: 18 18 20 16   Height:      Weight:      SpO2: 94% 95% 94% 95%    Intake/Output Summary (Last 24 hours) at 11/10/11 1552 Last data filed at 11/10/11 1335  Gross per 24 hour  Intake    480 ml  Output   1950 ml  Net  -1470 ml    Exam: General: Alert, awake, oriented x3, in no acute distress. HEENT: No bruits, no goiter. Heart: Regular rate and rhythm, without murmurs, rubs, gallops. Lungs: Clear to auscultation bilaterally. Abdomen: Soft, nontender, nondistended, positive bowel sounds. Suprapubic catheter in place. Extremities: No clubbing cyanosis or edema with positive pedal pulses. Neuro: Grossly intact, nonfocal.  Data Reviewed: Basic Metabolic Panel:  Lab 11/10/11 8756 11/08/11 1930  NA 139 139  K 3.9 3.8  CL 105 103  CO2 26 26  GLUCOSE 88 114*  BUN 12 13  CREATININE 0.86 1.03  CALCIUM 8.1* 8.9  MG -- --  PHOS -- --   Liver Function Tests:  Lab 11/08/11 1930  AST 12  ALT 10  ALKPHOS 86  BILITOT 0.4  PROT 6.4  ALBUMIN 2.9*   No results found for this basename: LIPASE:5,AMYLASE:5 in  the last 168 hours No results found for this basename: AMMONIA:5 in the last 168 hours CBC:  Lab 11/10/11 0500 11/08/11 1930  WBC 7.8 8.4  NEUTROABS -- 6.8  HGB 9.3* 10.0*  HCT 29.4* 30.3*  MCV 91.0 92.1  PLT 353 408*   Cardiac Enzymes: No results found for this basename: CKTOTAL:5,CKMB:5,CKMBINDEX:5,TROPONINI:5 in the last 168 hours BNP (last 3 results) No results found for this basename:  PROBNP:3 in the last 8760 hours CBG: No results found for this basename: GLUCAP:5 in the last 168 hours  Recent Results (from the past 240 hour(s))  MRSA PCR SCREENING     Status: Normal   Collection Time   11/08/11 11:59 AM      Component Value Range Status Comment   MRSA by PCR NEGATIVE  NEGATIVE Final   CULTURE, BLOOD (ROUTINE X 2)     Status: Normal (Preliminary result)   Collection Time   11/08/11  7:30 PM      Component Value Range Status Comment   Specimen Description BLOOD RIGHT ANTECUBITAL   Final    Special Requests BOTTLES DRAWN AEROBIC AND ANAEROBIC   Final    Culture  Setup Time 11/09/2011 02:01   Final    Culture     Final    Value:        BLOOD CULTURE RECEIVED NO GROWTH TO DATE CULTURE WILL BE HELD FOR 5 DAYS BEFORE ISSUING A FINAL NEGATIVE REPORT   Report Status PENDING   Incomplete   CULTURE, BLOOD (ROUTINE X 2)     Status: Normal (Preliminary result)   Collection Time   11/08/11  8:10 PM      Component Value Range Status Comment   Specimen Description BLOOD L HAND   Final    Special Requests BOTTLES DRAWN AEROBIC ONLY   Final    Culture  Setup Time 11/09/2011 02:01   Final    Culture     Final    Value:        BLOOD CULTURE RECEIVED NO GROWTH TO DATE CULTURE WILL BE HELD FOR 5 DAYS BEFORE ISSUING A FINAL NEGATIVE REPORT   Report Status PENDING   Incomplete   URINE CULTURE     Status: Normal (Preliminary result)   Collection Time   11/08/11  8:48 PM      Component Value Range Status Comment   Specimen Description URINE, CATHETERIZED   Final    Special Requests NONE   Final    Culture  Setup Time 11/09/2011 01:34   Final    Colony Count PENDING   Incomplete    Culture Culture reincubated for better growth   Final    Report Status PENDING   Incomplete      Studies: Ct Head Wo Contrast  11/09/2011  *RADIOLOGY REPORT*  Clinical Data: Altered mental status.  History of urinary tract infection.  CT HEAD WITHOUT CONTRAST  Technique:  Contiguous axial images  were obtained from the base of the skull through the vertex without contrast.  Comparison: None.  Findings: Diffuse cerebral atrophy.  Mild ventricular dilatation consistent with central atrophy.  Scattered low attenuation changes in the deep white matter consistent with small vessel ischemia.  No mass effect or midline shift.  No abnormal extra-axial fluid collections.  Gray-white matter junctions are distinct.  Basal cisterns are not effaced.  No evidence of acute intracranial hemorrhage.  Visualized paranasal sinuses and mastoid air cells are not opacified.  No depressed skull fractures.  IMPRESSION: No acute intracranial abnormalities.  Chronic atrophy and small vessel ischemic changes.  Original Report Authenticated By: Marlon Pel, M.D.   Dg Chest Portable 1 View  11/08/2011  *RADIOLOGY REPORT*  Clinical Data: Cough and weakness  PORTABLE CHEST - 1 VIEW  Comparison: None.  Findings: Cardiac leads obscure detail.  Elevated right hemidiaphragm noted.  Heart size upper limits of normal.  Lungs are clear.  No pleural effusion.  No acute osseous finding.  IMPRESSION: No acute cardiopulmonary process.  Original Report Authenticated By: Harrel Lemon, M.D.    Scheduled Meds:    . amLODipine  5 mg Oral Daily  . cefTRIAXone (ROCEPHIN)  IV  1 g Intravenous Q24H  . pantoprazole      . pantoprazole  40 mg Oral Q1200  . sodium chloride  3 mL Intravenous Q12H  . URELLE  1 tablet Oral TID   Continuous Infusions:    . sodium chloride 75 mL/hr at 11/09/11 2016  . DISCONTD: sodium chloride 1,000 mL (11/08/11 2218)  . DISCONTD: sodium chloride 1,000 mL (11/10/11 1336)    Principal Problem:  *Encephalopathy acute Active Problems:  BPH (benign prostatic hyperplasia)  GERD (gastroesophageal reflux disease)  UTI (lower urinary tract infection)  Altered mental status  HTN (hypertension)    Time spent: 40 mins    [facility]  [facility] Pager [phone]. If 8PM-8AM, please  contact night-coverage at [email or web address], password [facility] 11/10/2011, 3:52 PM  LOS: 2 days

## 2011-11-10 NOTE — Evaluation (Signed)
Occupational Therapy Evaluation Patient Details Name: Craig Waters MRN: [id number] DOB: 1930/04/28 Today's Date: 11/10/2011 Time: 5784-6962 OT Time Calculation (min): 15 min  OT Assessment / Plan / Recommendation Clinical Impression  Pt is a 78 yo male who presents with AMS, UTI. Pt is independent with all ADLs and mobility.    OT Assessment  Patient does not need any further OT services    Follow Up Recommendations  No OT follow up    Barriers to Discharge      Equipment Recommendations  None recommended by OT    Recommendations for Other Services    Frequency       Precautions / Restrictions Precautions Precautions: None   Pertinent Vitals/Pain     ADL  Grooming: Simulated;Independent Where Assessed - Grooming: Unsupported standing Upper Body Bathing: Simulated;Independent Where Assessed - Upper Body Bathing: Unsupported standing Lower Body Bathing: Simulated;Independent Where Assessed - Lower Body Bathing: Unsupported sit to stand Upper Body Dressing: Simulated;Independent Where Assessed - Upper Body Dressing: Unsupported standing Lower Body Dressing: Simulated;Independent Where Assessed - Lower Body Dressing: Unsupported sit to stand Toilet Transfer: Simulated;Independent Toilet Transfer Method: Sit to Barista: Other (comment) Nurse, children's) Toileting - Clothing Manipulation and Hygiene: Simulated;Independent Where Assessed - Toileting Clothing Manipulation and Hygiene: Standing Equipment Used: Gait belt    OT Diagnosis:    OT Problem List:   OT Treatment Interventions:     OT Goals    Visit Information  Last OT Received On: 11/10/11 Assistance Needed: +1 PT/OT Co-Evaluation/Treatment: Yes    Subjective Data  Subjective: They said I was hallucinating. Patient Stated Goal: Not asked   Prior Functioning  Vision/Perception  Home Living Lives With: Spouse;Daughter Available Help at Discharge: Family Type of Home: House Home  Access: Level entry Home Layout: One level Bathroom Shower/Tub: Tub/shower unit (mostly spongebathes) Bathroom Toilet: Standard Home Adaptive Equipment: Straight cane;Walker - rolling;Shower chair without back Prior Function Level of Independence: Independent Driving: No Vocation: Part time employment Communication Communication: No difficulties Dominant Hand: Right      Cognition  Overall Cognitive Status: Appears within functional limits for tasks assessed/performed Arousal/Alertness: Awake/alert Orientation Level: Appears intact for tasks assessed Behavior During Session: [facility] for tasks performed    Extremity/Trunk Assessment Right Upper Extremity Assessment RUE ROM/Strength/Tone: [facility] for tasks assessed Left Upper Extremity Assessment LUE ROM/Strength/Tone: WFL for tasks assessed   Mobility Bed Mobility Bed Mobility: Rolling Right;Right Sidelying to Sit Rolling Right: 6: Modified independent (Device/Increase time) Right Sidelying to Sit: 6: Modified independent (Device/Increase time) Transfers Transfers: Sit to Stand;Stand to Sit Sit to Stand: 7: Independent Stand to Sit: 7: Independent   Exercise    Balance Balance Balance Assessed: Yes Dynamic Standing Balance Dynamic Standing - Balance Support: No upper extremity supported;During functional activity Dynamic Standing - Level of Assistance: 7: Independent  End of Session OT - End of Session Equipment Utilized During Treatment: Gait belt Activity Tolerance: Patient tolerated treatment well Patient left: in chair;with call bell/phone within reach  GO     Silver Achey A OTR/L [phone] 11/10/2011, 11:57 AM

## 2011-11-11 DIAGNOSIS — E876 Hypokalemia: Secondary | ICD-10-CM

## 2011-11-11 DIAGNOSIS — N400 Enlarged prostate without lower urinary tract symptoms: Secondary | ICD-10-CM

## 2011-11-11 LAB — URINE CULTURE

## 2011-11-11 LAB — BASIC METABOLIC PANEL
BUN: 11 mg/dL (ref 6–23)
CO2: 23 mEq/L (ref 19–32)
Chloride: 104 mEq/L (ref 96–112)
Creatinine, Ser: 0.72 mg/dL (ref 0.50–1.35)
GFR calc Af Amer: >90 mL/min (ref >90)
Glucose, Bld: 106 mg/dL — ABNORMAL HIGH (ref 70–99)

## 2011-11-11 LAB — CBC
HCT: 30.5 % — ABNORMAL LOW (ref 39.0–52.0)
MCH: 28.7 pg (ref 26.0–34.0)
MCHC: 31.5 g/dL (ref 30.0–36.0)
MCV: 91 fL (ref 78.0–100.0)
RDW: 13.3 % (ref 11.5–15.5)

## 2011-11-11 MED ORDER — MENTHOL 3 MG MT LOZG
1.0000 | LOZENGE | OROMUCOSAL | Status: DC | PRN
  Filled 2011-11-11: qty 9

## 2011-11-11 MED ORDER — AMLODIPINE BESYLATE 10 MG PO TABS
10.0000 mg | ORAL_TABLET | Freq: Every day | ORAL | Status: DC
  Administered 2011-11-12: 10 mg via ORAL
  Filled 2011-11-11 (×2): qty 1

## 2011-11-11 MED ORDER — POTASSIUM CHLORIDE CRYS ER 20 MEQ PO TBCR
40.0000 meq | EXTENDED_RELEASE_TABLET | Freq: Once | ORAL | Status: AC
  Administered 2011-11-11: 40 meq via ORAL
  Filled 2011-11-11: qty 2

## 2011-11-11 MED ORDER — LEVOFLOXACIN 500 MG PO TABS
500.0000 mg | ORAL_TABLET | Freq: Every day | ORAL | Status: DC
Start: 2011-11-11 — End: 2011-11-12
  Administered 2011-11-11: 500 mg via ORAL
  Filled 2011-11-11 (×2): qty 1

## 2011-11-11 MED ORDER — PHENOL 1.4 % MT LIQD
1.0000 | OROMUCOSAL | Status: DC | PRN
  Filled 2011-11-11: qty 177

## 2011-11-11 NOTE — Progress Notes (Signed)
[facility] PROGRESS NOTE  Craig Waters JYN:[id number] DOB: 18-Jan-1931 DOA: 11/08/2011 PCP: Kristian Covey, MD  Assessment/Plan: Principal Problem:  *Encephalopathy acute Active Problems:  BPH (benign prostatic hyperplasia)  GERD (gastroesophageal reflux disease)  UTI (lower urinary tract infection)  Altered mental status  HTN (hypertension)  Hypokalemia  #1 acute encephalopathy Likely secondary to nucynta in the setting of probable urinary tract infection. Urine cultures with staph species, sensitivities pending . CT of the head was negative. B12 levels are 131. RPR is nonreactive. Sedimentation rate is 36. TSH is 0.344. Patient with clinical improvement. Nucynta is on hold. Await urine cultures. Continue empiric IV Rocephin day #4/7.  #2 urinary tract infection Urine cultures with staph, sensitivities pending. Continue IV Rocephin day #4/7.  #3 hypertension Follow BP. Increase Norvasc to 10mg  daily.   #4 gastroesophageal reflux disease  PPI.  #5 BPH/status post recent prostatectomy Have contacted urology and spoke with Dr. Brunilda Payor who knows of patient admission and feels there is nothing from this standpoint to do at this time and to followup as outpatient. It was recommended that if urine cultures are positive then treat patient for approximately 7 days of antibiotics.  #6 prophylaxis PPI for GI prophylaxis. SCDs for DVT prophylaxis.   Code Status: Full Family Communication: Discussed with patient Disposition Plan: Home when medically stable   Brief narrative: 78 yo male with recent prostatectomy was c/o fever, nausea, hallucinating, (visual, seeing orange tractors, and cakes) and called Dr. Baxter Flattery office and told to stop nucynta, and then came to ED. In the ED, found to have Uti. (on Trimethoprim) at baseline. CXR negative for any acute process. CT brain pending. Pt will be admitted for altered ms, likely secondary to nucynta and uti.     Consultants:  None  Procedures:  CT of the head was done on 11/09/2011 that showed no acute intracranial abnormalities.  Antibiotics:  IV Rocephin start date 11/08/2011  HPI/Subjective: Patient laying in bed with no complaints. Patient denies any hallucinations. Patient states he feels much better.  Objective: Filed Vitals:   11/10/11 1830 11/10/11 2232 11/11/11 0519 11/11/11 1300  BP: 161/72 161/81 137/83 159/70  Pulse:  72 81 83  Temp:  98.6 F (37 C) 99.4 F (37.4 C)   TempSrc:  Oral Oral   Resp: 20 18 18 18   Height:      Weight:   96.208 kg (212 lb 1.6 oz)   SpO2: 100% 95% 94% 97%    Intake/Output Summary (Last 24 hours) at 11/11/11 1730 Last data filed at 11/11/11 0333  Gross per 24 hour  Intake      0 ml  Output   1200 ml  Net  -1200 ml    Exam: General: Alert, awake, oriented x3, in no acute distress. HEENT: No bruits, no goiter. Heart: Regular rate and rhythm, without murmurs, rubs, gallops. Lungs: Clear to auscultation bilaterally. Abdomen: Soft, nontender, nondistended, positive bowel sounds. Suprapubic catheter in place. Extremities: No clubbing cyanosis or edema with positive pedal pulses. Neuro: Grossly intact, nonfocal.  Data Reviewed: Basic Metabolic Panel:  Lab 11/11/11 8657 11/10/11 0500 11/08/11 1930  NA 137 139 139  K 3.4* 3.9 3.8  CL 104 105 103  CO2 23 26 26   GLUCOSE 106* 88 114*  BUN 11 12 13   CREATININE 0.72 0.86 1.03  CALCIUM 8.3* 8.1* 8.9  MG -- -- --  PHOS -- -- --   Liver Function Tests:  Lab 11/08/11 1930  AST 12  ALT 10  ALKPHOS 86  BILITOT 0.4  PROT 6.4  ALBUMIN 2.9*   No results found for this basename: LIPASE:5,AMYLASE:5 in the last 168 hours No results found for this basename: AMMONIA:5 in the last 168 hours CBC:  Lab 11/11/11 0511 11/10/11 0500 11/08/11 1930  WBC 7.6 7.8 8.4  NEUTROABS -- -- 6.8  HGB 9.6* 9.3* 10.0*  HCT 30.5* 29.4* 30.3*  MCV 91.0 91.0 92.1  PLT 321 353 408*   Cardiac  Enzymes: No results found for this basename: CKTOTAL:5,CKMB:5,CKMBINDEX:5,TROPONINI:5 in the last 168 hours BNP (last 3 results) No results found for this basename: PROBNP:3 in the last 8760 hours CBG: No results found for this basename: GLUCAP:5 in the last 168 hours  Recent Results (from the past 240 hour(s))  MRSA PCR SCREENING     Status: Normal   Collection Time   11/08/11 11:59 AM      Component Value Range Status Comment   MRSA by PCR NEGATIVE  NEGATIVE Final   CULTURE, BLOOD (ROUTINE X 2)     Status: Normal (Preliminary result)   Collection Time   11/08/11  7:30 PM      Component Value Range Status Comment   Specimen Description BLOOD RIGHT ANTECUBITAL   Final    Special Requests BOTTLES DRAWN AEROBIC AND ANAEROBIC   Final    Culture  Setup Time 11/09/2011 02:01   Final    Culture     Final    Value:        BLOOD CULTURE RECEIVED NO GROWTH TO DATE CULTURE WILL BE HELD FOR 5 DAYS BEFORE ISSUING A FINAL NEGATIVE REPORT   Report Status PENDING   Incomplete   CULTURE, BLOOD (ROUTINE X 2)     Status: Normal (Preliminary result)   Collection Time   11/08/11  8:10 PM      Component Value Range Status Comment   Specimen Description BLOOD L HAND   Final    Special Requests BOTTLES DRAWN AEROBIC ONLY   Final    Culture  Setup Time 11/09/2011 02:01   Final    Culture     Final    Value:        BLOOD CULTURE RECEIVED NO GROWTH TO DATE CULTURE WILL BE HELD FOR 5 DAYS BEFORE ISSUING A FINAL NEGATIVE REPORT   Report Status PENDING   Incomplete   URINE CULTURE     Status: Normal   Collection Time   11/08/11  8:48 PM      Component Value Range Status Comment   Specimen Description URINE, CATHETERIZED   Final    Special Requests NONE   Final    Culture  Setup Time 11/09/2011 01:34   Final    Colony Count 10,000 COLONIES/ML   Final    Culture     Final    Value: STAPHYLOCOCCUS SPECIES (COAGULASE NEGATIVE)     Note: RIFAMPIN AND GENTAMICIN SHOULD NOT BE USED AS SINGLE DRUGS FOR  TREATMENT OF STAPH INFECTIONS.   Report Status 11/11/2011 FINAL   Final    Organism ID, Bacteria STAPHYLOCOCCUS SPECIES (COAGULASE NEGATIVE)   Final      Studies: Ct Head Wo Contrast  11/09/2011  *RADIOLOGY REPORT*  Clinical Data: Altered mental status.  History of urinary tract infection.  CT HEAD WITHOUT CONTRAST  Technique:  Contiguous axial images were obtained from the base of the skull through the vertex without contrast.  Comparison: None.  Findings: Diffuse cerebral atrophy.  Mild ventricular dilatation consistent with  central atrophy.  Scattered low attenuation changes in the deep white matter consistent with small vessel ischemia.  No mass effect or midline shift.  No abnormal extra-axial fluid collections.  Gray-white matter junctions are distinct.  Basal cisterns are not effaced.  No evidence of acute intracranial hemorrhage.  Visualized paranasal sinuses and mastoid air cells are not opacified.  No depressed skull fractures.  IMPRESSION: No acute intracranial abnormalities.  Chronic atrophy and small vessel ischemic changes.  Original Report Authenticated By: Marlon Pel, M.D.   Dg Chest Portable 1 View  11/08/2011  *RADIOLOGY REPORT*  Clinical Data: Cough and weakness  PORTABLE CHEST - 1 VIEW  Comparison: None.  Findings: Cardiac leads obscure detail.  Elevated right hemidiaphragm noted.  Heart size upper limits of normal.  Lungs are clear.  No pleural effusion.  No acute osseous finding.  IMPRESSION: No acute cardiopulmonary process.  Original Report Authenticated By: Harrel Lemon, M.D.    Scheduled Meds:    . amLODipine  10 mg Oral Daily  . cefTRIAXone (ROCEPHIN)  IV  1 g Intravenous Q24H  . pantoprazole  40 mg Oral Q1200  . potassium chloride  40 mEq Oral Once  . sodium chloride  3 mL Intravenous Q12H  . URELLE  1 tablet Oral TID  . DISCONTD: amLODipine  5 mg Oral Daily   Continuous Infusions:    Principal Problem:  *Encephalopathy acute Active Problems:   BPH (benign prostatic hyperplasia)  GERD (gastroesophageal reflux disease)  UTI (lower urinary tract infection)  Altered mental status  HTN (hypertension)  Hypokalemia    Time spent: 40 mins    [facility]  [facility] Pager [phone]. If 8PM-8AM, please contact night-coverage at [email or web address], password [facility] 11/11/2011, 5:30 PM  LOS: 3 days

## 2011-11-12 LAB — BASIC METABOLIC PANEL
Calcium: 8.8 mg/dL (ref 8.4–10.5)
Creatinine, Ser: 0.83 mg/dL (ref 0.50–1.35)
GFR calc non Af Amer: 82 mL/min — ABNORMAL LOW (ref >90)
Sodium: 138 mEq/L (ref 135–145)

## 2011-11-12 LAB — CBC
MCH: 29.1 pg (ref 26.0–34.0)
MCHC: 32 g/dL (ref 30.0–36.0)
Platelets: 370 10*3/uL (ref 150–400)
RDW: 13.3 % (ref 11.5–15.5)

## 2011-11-12 MED ORDER — BACITRACIN-NEOMYCIN-POLYMYXIN 400-5-5000 EX OINT: 1.0000 "application " | TOPICAL_OINTMENT | Freq: Three times a day (TID) | CUTANEOUS | Status: AC | PRN

## 2011-11-12 MED ORDER — AMLODIPINE BESYLATE 10 MG PO TABS: 10.0000 mg | ORAL_TABLET | Freq: Every day | ORAL | Status: DC

## 2011-11-12 MED ORDER — NITROFURANTOIN MONOHYD MACRO 100 MG PO CAPS: 100.0000 mg | ORAL_CAPSULE | Freq: Two times a day (BID) | ORAL | Status: AC

## 2011-11-12 MED ORDER — NITROFURANTOIN MONOHYD MACRO 100 MG PO CAPS
100.0000 mg | ORAL_CAPSULE | Freq: Two times a day (BID) | ORAL | Status: DC
  Administered 2011-11-12 (×2): 100 mg via ORAL
  Filled 2011-11-12 (×4): qty 1

## 2011-11-12 MED ORDER — TRIMETHOPRIM 100 MG PO TABS: 100.0000 mg | ORAL_TABLET | Freq: Every day | ORAL | Status: AC

## 2011-11-12 NOTE — Discharge Summary (Signed)
Physician Discharge Summary  OBE AHLERS ZOX:[id number] DOB: 10/09/1930 DOA: 11/08/2011  PCP: Kristian Covey, MD  Admit date: 11/08/2011 Discharge date: 11/12/2011  Recommendations for Outpatient Follow-up:  1. Patient is to followup with Dr. Patsi Sears of urology as scheduled tomorrow 11/13/2011. 2. Patient is to followup with PCP one week post discharge.  Discharge Diagnoses:  Principal Problem:  *Encephalopathy acute Active Problems:  BPH (benign prostatic hyperplasia)  GERD (gastroesophageal reflux disease)  UTI (lower urinary tract infection)  Altered mental status  HTN (hypertension)  Hypokalemia   Discharge Condition: Stable and improved  Diet recommendation: General  History of present illness:  78 yo male with recent prostatectomy was c/o fever, nausea, hallucinating, (visual, seeing orange tractors, and cakes) and called Dr. Baxter Flattery office and told to stop nucynta, and then came to ED. In the ED, found to have Uti. (on Trimethoprim) at baseline. CXR negative for any acute process. CT brain pending. Pt will be admitted for altered ms, likely secondary to nucynta and uti.  For the rest of admission history and physical see  H&P dictated by Dr. Faith Rogue Course:  #1 acute encephalopathy Patient was admitted with acute encephalopathy as he had presented with hallucinations after being started on nucynta. Patient is nucynta was discontinued. Patient was also noted to have a UTI per urinalysis done. CT of the head which was done was unremarkable. Patient was placed empirically on IV Rocephin and monitored. Patient improved clinically and was back at baseline by day of discharge. Urine cultures which were done came back with Staphylococcus species. Patient was subsequently transitioned from IV Rocephin to oral Macrobid. Patient be discharged home on 3 more days of oral Macrobid to complete a one-week course of antibiotic therapy. Patient was discharged in stable and  improved condition.  #2 UTI On admission urinalysis which was done was consistent with a urinary tract infection. Urine cultures grew out a quietly is negative staph species. Patient on admission was initially placed empirically on IV Rocephin. Patient improved clinically on a daily basis. Sensitivities came back and cultures were sensitive to Macrobid. Due to the fact that patient was on trimethoprim prior to admission may have caused some partial treatment of his UTI. Patient will be discharged on 3 more days of oral Macrobid to complete a one-week course of antibiotic therapy. Patient will resume his trimethoprim after he finishes course of Macrobid. Patient was discharged home in stable and improved condition. Patient is to followup with his urologist as scheduled.  #3 hypertension Do not physician patient was noted to be hypertensive. With systolic blood pressures in the 150s to 160s. During his last hospitalization patient's Flomax was discontinued. Patient was started on oral Norvasc at 5 mg daily which was subsequently titrated to 10 mg daily with good blood pressure control. Patient be discharged home on Norvasc 10 mg daily and will need to followup with PCP one week post discharge. On day of discharge patient's systolic blood pressure was 124. Patient be discharged in stable and improved condition.  Procedures:  CT head done on 11/09/2011  Consultations:  None  Discharge Exam: Filed Vitals:   11/12/11 0529  BP: 124/65  Pulse: 78  Temp: 98.3 F (36.8 C)  Resp: 18   Filed Vitals:   11/11/11 0519 11/11/11 1300 11/11/11 2200 11/12/11 0529  BP: 137/83 159/70 126/74 124/65  Pulse: 81 83 78 78  Temp: 99.4 F (37.4 C)  98.6 F (37 C) 98.3 F (36.8 C)  TempSrc: Oral  Oral  Oral  Resp: 18 18 18 18   Height:      Weight: 96.208 kg (212 lb 1.6 oz)     SpO2: 94% 97% 94% 93%   subjective: No complaints. Patient denies any hallucinations. Patient states he is at his baseline. General:  Alert, awake, oriented x3, in no acute distress. HEENT: No bruits, no goiter. Heart: Regular rate and rhythm, without murmurs, rubs, gallops. Lungs: Clear to auscultation bilaterally. Abdomen: Soft, nontender, nondistended, positive bowel sounds. Extremities: No clubbing cyanosis or edema with positive pedal pulses. Neuro: Grossly intact, nonfocal.   Discharge Instructions  Discharge Orders    Future Orders Please Complete By Expires   Diet general      Increase activity slowly      Discharge instructions      Comments:   Follow up with Kristian Covey, MD in 1 week Follow up with Dr Patsi Sears as scheduled.     Medication List  As of 11/12/2011  1:30 PM   TAKE these medications         amLODipine 10 MG tablet   Commonly known as: NORVASC   Take 1 tablet (10 mg total) by mouth daily.      ibuprofen 200 MG tablet   Commonly known as: ADVIL,MOTRIN   Take 400 mg by mouth every 6 (six) hours as needed. pain      neomycin-bacitracin-polymyxin ointment   Commonly known as: NEOSPORIN   Apply 1 application topically 3 (three) times daily as needed. apply to eye.      nitrofurantoin (macrocrystal-monohydrate) 100 MG capsule   Commonly known as: MACROBID   Take 1 capsule (100 mg total) by mouth every 12 (twelve) hours. Take for 3 days then stop. After you finish the course resume trimethoprim.      trimethoprim 100 MG tablet   Commonly known as: TRIMPEX   Take 1 tablet (100 mg total) by mouth daily. Resume in 4 days after you finish macrobid.      URELLE 81 MG Tabs   Take 1 tablet by mouth 3 (three) times daily.           Follow-up Information    Follow up with Kristian Covey, MD. Schedule an appointment as soon as possible for a visit in 1 week.   Contact information:   [address] [place] [address] [phone]       Follow up with Jethro Bolus I, MD on 11/13/2011. (f/u as scheduled)    Contact information:   [address]  [place] [facility] [place] [address] [phone]           The results of significant diagnostics from this hospitalization (including imaging, microbiology, ancillary and laboratory) are listed below for reference.    Significant Diagnostic Studies: Ct Head Wo Contrast  11/09/2011  *RADIOLOGY REPORT*  Clinical Data: Altered mental status.  History of urinary tract infection.  CT HEAD WITHOUT CONTRAST  Technique:  Contiguous axial images were obtained from the base of the skull through the vertex without contrast.  Comparison: None.  Findings: Diffuse cerebral atrophy.  Mild ventricular dilatation consistent with central atrophy.  Scattered low attenuation changes in the deep white matter consistent with small vessel ischemia.  No mass effect or midline shift.  No abnormal extra-axial fluid collections.  Gray-white matter junctions are distinct.  Basal cisterns are not effaced.  No evidence of acute intracranial hemorrhage.  Visualized paranasal sinuses and mastoid air cells are not opacified.  No depressed skull fractures.  IMPRESSION: No acute intracranial abnormalities.  Chronic atrophy and small vessel ischemic changes.  Original Report Authenticated By: Marlon Pel, M.D.   Dg Chest Portable 1 View  11/08/2011  *RADIOLOGY REPORT*  Clinical Data: Cough and weakness  PORTABLE CHEST - 1 VIEW  Comparison: None.  Findings: Cardiac leads obscure detail.  Elevated right hemidiaphragm noted.  Heart size upper limits of normal.  Lungs are clear.  No pleural effusion.  No acute osseous finding.  IMPRESSION: No acute cardiopulmonary process.  Original Report Authenticated By: Harrel Lemon, M.D.    Microbiology: Recent Results (from the past 240 hour(s))  MRSA PCR SCREENING     Status: Normal   Collection Time   11/08/11 11:59 AM      Component Value Range Status Comment   MRSA by PCR NEGATIVE  NEGATIVE Final   CULTURE, BLOOD (ROUTINE X  2)     Status: Normal (Preliminary result)   Collection Time   11/08/11  7:30 PM      Component Value Range Status Comment   Specimen Description BLOOD RIGHT ANTECUBITAL   Final    Special Requests BOTTLES DRAWN AEROBIC AND ANAEROBIC   Final    Culture  Setup Time 11/09/2011 02:01   Final    Culture     Final    Value:        BLOOD CULTURE RECEIVED NO GROWTH TO DATE CULTURE WILL BE HELD FOR 5 DAYS BEFORE ISSUING A FINAL NEGATIVE REPORT   Report Status PENDING   Incomplete   CULTURE, BLOOD (ROUTINE X 2)     Status: Normal (Preliminary result)   Collection Time   11/08/11  8:10 PM      Component Value Range Status Comment   Specimen Description BLOOD L HAND   Final    Special Requests BOTTLES DRAWN AEROBIC ONLY   Final    Culture  Setup Time 11/09/2011 02:01   Final    Culture     Final    Value:        BLOOD CULTURE RECEIVED NO GROWTH TO DATE CULTURE WILL BE HELD FOR 5 DAYS BEFORE ISSUING A FINAL NEGATIVE REPORT   Report Status PENDING   Incomplete   URINE CULTURE     Status: Normal   Collection Time   11/08/11  8:48 PM      Component Value Range Status Comment   Specimen Description URINE, CATHETERIZED   Final    Special Requests NONE   Final    Culture  Setup Time 11/09/2011 01:34   Final    Colony Count 10,000 COLONIES/ML   Final    Culture     Final    Value: STAPHYLOCOCCUS SPECIES (COAGULASE NEGATIVE)     Note: RIFAMPIN AND GENTAMICIN SHOULD NOT BE USED AS SINGLE DRUGS FOR TREATMENT OF STAPH INFECTIONS.   Report Status 11/11/2011 FINAL   Final    Organism ID, Bacteria STAPHYLOCOCCUS SPECIES (COAGULASE NEGATIVE)   Final      Labs: Basic Metabolic Panel:  Lab 11/12/11 5409 11/11/11 0511 11/10/11 0500 11/08/11 1930  NA 138 137 139 139  K 3.8 3.4* 3.9 3.8  CL 103 104 105 103  CO2 27 23 26 26   GLUCOSE 106* 106* 88 114*  BUN 12 11 12 13   CREATININE 0.83 0.72 0.86 1.03  CALCIUM 8.8 8.3* 8.1* 8.9  MG -- -- -- --  PHOS -- -- -- --   Liver Function Tests:  Lab  11/08/11 1930  AST 12  ALT 10  ALKPHOS 86  BILITOT 0.4  PROT 6.4  ALBUMIN 2.9*   No results found for this basename: LIPASE:5,AMYLASE:5 in the last 168 hours No results found for this basename: AMMONIA:5 in the last 168 hours CBC:  Lab 11/12/11 0525 11/11/11 0511 11/10/11 0500 11/08/11 1930  WBC 8.5 7.6 7.8 8.4  NEUTROABS -- -- -- 6.8  HGB 10.5* 9.6* 9.3* 10.0*  HCT 32.8* 30.5* 29.4* 30.3*  MCV 90.9 91.0 91.0 92.1  PLT 370 321 353 408*   Cardiac Enzymes: No results found for this basename: CKTOTAL:5,CKMB:5,CKMBINDEX:5,TROPONINI:5 in the last 168 hours BNP: BNP (last 3 results) No results found for this basename: PROBNP:3 in the last 8760 hours CBG: No results found for this basename: GLUCAP:5 in the last 168 hours  Time coordinating discharge: 50 mins  Signed:  Anthonella Klausner  [facility] 11/12/2011, 1:30 PM

## 2011-11-15 LAB — CULTURE, BLOOD (ROUTINE X 2): Culture: NO GROWTH

## 2012-01-23 ENCOUNTER — Encounter: Payer: Self-pay | Admitting: Internal Medicine

## 2012-05-20 ENCOUNTER — Telehealth: Payer: Self-pay | Admitting: Internal Medicine

## 2012-05-20 NOTE — Telephone Encounter (Signed)
I spoke with the patient's daughter.  He had lithotripsy last week.  He has not had a BM i a week according to the daughter. He has tried multiple OTC laxatives with no relief.  I have offered an appt for today with Doug Sou, PA. The patient declines the appt.  He has a call into Dr. Patsi Sears and wants to see what he says before he agrees to an appt.  They will call back if they change their mind.

## 2012-05-28 ENCOUNTER — Ambulatory Visit: Payer: Medicare Other | Admitting: Nurse Practitioner

## 2012-05-28 ENCOUNTER — Telehealth: Payer: Self-pay | Admitting: Internal Medicine

## 2012-05-28 NOTE — Telephone Encounter (Signed)
Offer pt's daughter an appt with Gunnar Fusi today or Dr Leone Payor has one tomorrow available. Daughter will call me back. Called back and pt prefers to see Dr Leone Payor; he will come in tomorrow at 0945am.

## 2012-05-29 ENCOUNTER — Encounter: Payer: Self-pay | Admitting: Internal Medicine

## 2012-05-29 ENCOUNTER — Ambulatory Visit (INDEPENDENT_AMBULATORY_CARE_PROVIDER_SITE_OTHER)
Admission: RE | Admit: 2012-05-29 | Discharge: 2012-05-29 | Disposition: A | Discharge status: Home / Self Care | Payer: Medicare Other | Source: Ambulatory Visit | Attending: Internal Medicine | Admitting: Internal Medicine

## 2012-05-29 ENCOUNTER — Ambulatory Visit (INDEPENDENT_AMBULATORY_CARE_PROVIDER_SITE_OTHER): Payer: Medicare Other | Admitting: Internal Medicine

## 2012-05-29 VITALS — BP 150/78 | HR 101 | Ht 69.75 in | Wt 223.0 lb

## 2012-05-29 DIAGNOSIS — M546 Pain in thoracic spine: Secondary | ICD-10-CM

## 2012-05-29 DIAGNOSIS — K5900 Constipation, unspecified: Secondary | ICD-10-CM

## 2012-05-29 DIAGNOSIS — M545 Low back pain: Secondary | ICD-10-CM

## 2012-05-29 MED ORDER — SUPREP BOWEL PREP KIT 17.5-3.13-1.6 GM/177ML PO SOLN: ORAL | Status: DC

## 2012-05-29 NOTE — Progress Notes (Signed)
Quick Note:  He also needs to call us back with results tomorrow ______

## 2012-05-29 NOTE — Progress Notes (Signed)
Subjective:    Patient ID: Craig Waters, male    DOB: August 02, 1930, 79 y.o.   MRN: [id number]  HPI Mr. had a 79 year old man known from previous GI evaluations. He comes in today unable to effectively move his bowels for the past 3 weeks. This started when he bent over moving a piece of equipment, and he developed acute back pain. He has no lower extremity neurologic symptoms or urinary problems but he has been unable to generate good pressure to have a bowel movement he says. His back pain is better but not resolved. He had both mid and lower back pain. He tried an over-the-counter laxative pill we cannot identify a minute did not really help. He has an enema at home and is wondering if he should use that. He has not had any rectal bleeding. He's had small incomplete bowel movements and not many in the past 3 weeks. He is not describing much in the way of abdominal pain though he feels bloated and has lost his appetite.  He has also been passing kidney stones over the past month. This is a chronic recurrent problem for him.  No Known Allergies Outpatient Prescriptions Prior to Visit  Medication Sig Dispense Refill  . ibuprofen (ADVIL,MOTRIN) 200 MG tablet Take 400 mg by mouth every 6 (six) hours as needed. pain        Toradol as needed     0          Last reviewed on 05/29/2012  9:59 AM by Craig Boop, MD Past Medical History  Diagnosis Date  . Blood in stool   . Diverticulitis   . GERD (gastroesophageal reflux disease)   . Glaucoma   . Ulcer   . History of transfusion of whole blood   . Prostate disorder   . Shortness of breath 10-18-11    with exertion, cardiac cath done 7-8 yrs ago negative.  . Vertigo 10-18-11    inner ear issues occ. -tx. Bonine as needed  . Arthritis 10-18-11    back, fingers  . H/O hiatal hernia 10-18-11    noted on recent CXR   Past Surgical History  Procedure Date  . Gastric ulcer 10-18-11    '91-blleding ulcer with blood transfusions after  .  Appendectomy 10-18-11    age 62  . Cataract extraction, bilateral 10-18-11    bilateral   . Eye surgery 10-18-11    laser eye surgery s/p cataract bilaterally  . Cardiac catheterization 10-18-11    7-8 yrs ago-mild blockage,no tx. indicated  . Prostatectomy 10/27/2011    Procedure: PROSTATECTOMY SUPRAPUBIC;  Surgeon: Craig Ludwig, MD;  Location: [facility];  Service: Urology;  Laterality: N/A;  Placement of suprapubic tube  . Cystoscopy 10/27/2011    Procedure: CYSTOSCOPY FLEXIBLE;  Surgeon: Craig Ludwig, MD;  Location: [facility];  Service: Urology;  Laterality: N/A;   History   Social History  . Marital Status: Married    Spouse Name: N/A    Number of Children: N/A  . Years of Education: N/A   Occupational History  . [employer] Self Employed   Social History Main Topics  . Smoking status: Former Smoker -- 1.0 packs/day for 5 years    Types: Cigarettes    Quit date: 09/26/1978  . Smokeless tobacco: Former Neurosurgeon    Types: Chew    Quit date: 11/01/2011  . Alcohol Use: No  . Drug Use: No  . Sexually Active: None  Other Topics Concern  . None   Social History Narrative   Married, one daughter. One cup of coffee a day. He is semiretired.   Family History  Problem Relation Age of Onset  . Breast cancer Sister   . Dementia Mother   . Heart attack Father    Review of Systems Other for the back pain and mild fever recently, no focal problems. GI review of systems and all other review of systems negative or accept as per history of present illness.    Objective:   Physical Exam General:  Well-developed, well-nourished and in no acute distress Eyes:  anicteric. Abdomen:  soft,  Mildly tender mid abdomen, no hepatosplenomegaly, hernia, or mass and BS+.  Rectal: Normal anoderm and resting tone, no mass, scanty stool, ? Dilated rectum, good voluntary squueze, pain in back and abdomen and reduced ability to Valsalva and descend/relax  sphincter Extremities:   no edema + LE varicositise Skin   no rash, but patchy rubor Neuro:  A&O x 3. lower extremity strength intact 5/5, DTR knees both 1+, no clonus Musck: Tender over lower T spine, otherwise ok w/o CVAT, he does have pain with flexion and extension when moving on exam table Psych:  appropriate mood and  Affect.   Data Reviewed: 2009 colonoscopy without polyps      Assessment & Plan:   1. Thoracic spine pain   2. Low back pain   3. Constipation    1. Last but again that a compression fracture and subsequent sequelae from that. 2. Seems like some sort of a functional disturbance. He is not taking narcotics and he barely uses the Toradol he tells me. 3. I have ordered spine films and two-view abdomen. It is okay for him to use the enema. 4. abdominal films are unremarkable except for paucity of bowel gas. The spine films don't show any compression fracture, there is degenerative disc disease most prominent in the lumbar spine.  I will have him take half to all of Su prep dose tonight if the enema does not provide relief and we will followup with him by phone tomorrow.  WJ:[id number],GNFAOZ Craig Millin, MD

## 2012-05-29 NOTE — Patient Instructions (Addendum)
We are sending you to the [facility] department today Go to the basement today for your XRAYS Use your ENEMA once you get home We are giving you a sample SUPREP kit to take home and use Do Not use the Suprep until we contact you with instructions  Thank you for choosing me and [facility].  Iva Boop, M.D., [facility]

## 2012-05-29 NOTE — Progress Notes (Signed)
Quick Note:  xrays do not show a bowel problem  Has some arthritis and slight curvature/alignmemnt problems in the spine - nothing terrible and no fracture  If the enema does not work then take the Suprep at least 1 dose and water as written on box and in instructions to move bowels ______

## 2012-05-30 ENCOUNTER — Telehealth: Payer: Self-pay | Admitting: Internal Medicine

## 2012-05-30 NOTE — Telephone Encounter (Signed)
See results from 05/29/12.

## 2012-05-30 NOTE — Progress Notes (Signed)
Quick Note:  Ok I need to have him schedule a colonoscopy for change in bowel habits ______

## 2012-06-11 ENCOUNTER — Encounter: Payer: Self-pay | Admitting: Internal Medicine

## 2012-06-11 ENCOUNTER — Ambulatory Visit (AMBULATORY_SURGERY_CENTER): Payer: Medicare Other

## 2012-06-11 VITALS — Ht 69.0 in | Wt 218.0 lb

## 2012-06-11 DIAGNOSIS — Z1211 Encounter for screening for malignant neoplasm of colon: Secondary | ICD-10-CM

## 2012-06-11 DIAGNOSIS — K5900 Constipation, unspecified: Secondary | ICD-10-CM

## 2012-06-21 ENCOUNTER — Encounter: Payer: Self-pay | Admitting: Internal Medicine

## 2012-06-21 ENCOUNTER — Ambulatory Visit (AMBULATORY_SURGERY_CENTER): Payer: Medicare Other | Admitting: Internal Medicine

## 2012-06-21 VITALS — BP 148/108 | HR 75 | Temp 98.9°F | Resp 30 | Ht 69.0 in | Wt 218.0 lb

## 2012-06-21 DIAGNOSIS — K648 Other hemorrhoids: Secondary | ICD-10-CM

## 2012-06-21 DIAGNOSIS — D126 Benign neoplasm of colon, unspecified: Secondary | ICD-10-CM

## 2012-06-21 MED ORDER — SODIUM CHLORIDE 0.9 % IV SOLN: 500.0000 mL | INTRAVENOUS | Status: DC

## 2012-06-21 NOTE — Progress Notes (Addendum)
Patient did not have preoperative order for IV antibiotic SSI prophylaxis. (G8918)  Patient did not experience any of the following events: a burn prior to discharge; a fall within the facility; wrong site/side/patient/procedure/implant event; or a hospital transfer or hospital admission upon discharge from the facility. (G8907)  

## 2012-06-21 NOTE — Op Note (Signed)
[facility] [address] [place], [address]   COLONOSCOPY PROCEDURE REPORT  PATIENT: Unknown, [name]  MR#: [id number] BIRTHDATE: Oct 23, 1930 , 81  yrs. old GENDER: Male ENDOSCOPIST: Iva Boop, MD, [facility] PROCEDURE DATE:  06/21/2012 PROCEDURE:   Colonoscopy with biopsy and Colonoscopy with snare polypectomy ASA CLASS:   Class II INDICATIONS:Change in bowel habits. MEDICATIONS: propofol (Diprivan) 150mg  IV, MAC sedation, administered by CRNA, and These medications were titrated to patient response per physician's verbal order  DESCRIPTION OF PROCEDURE:   After the risks benefits and alternatives of the procedure were thoroughly explained, informed consent was obtained.  A digital rectal exam revealed no rectal mass.   The LB [id number]  endoscope was introduced through the anus and advanced to the cecum, which was identified by both the appendix and ileocecal valve. No adverse events experienced. The quality of the prep was Suprep excellent  The instrument was then slowly withdrawn as the colon was fully examined.      COLON FINDINGS: Two polypoid shaped sessile polyps ranging between 3-63mm in size were found in the transverse colon and sigmoid colon. A polypectomy was performed with cold forceps (transverse) and with a cold snare (sigmoid).  The resection was complete and the polyp tissue was completely retrieved.   Moderate diverticulosis was noted in the sigmoid colon.   Small internal hemorrhoids were found.   The colon mucosa was otherwise normal.   A right colon retroflexion was performed.  Retroflexed views revealed internal hemorrhoids. The time to cecum=3 minutes 14 seconds.  Withdrawal time=9 minutes 40 seconds.  The scope was withdrawn and the procedure completed. COMPLICATIONS: There were no complications.  ENDOSCOPIC IMPRESSION: 1.   Two sessile polyps ranging between 3-59mm in size were found in the transverse colon and sigmoid  colon; polypectomy was performed with cold forceps and with a cold snare 2.   Moderate diverticulosis was noted in the sigmoid colon 3.   Small internal hemorrhoids 4.   The colon mucosa was otherwise normal       RECOMMENDATIONS: 1.  High fiber diet 2.   MiraLax 1-2 doses daily Senokot as needed every 2-3 days See me if that is not helping   eSigned:  Iva Boop, MD, [facility] 06/21/2012 2:35 PM   cc: Windle Guard, MD and The Patient   PATIENT NAME:  Craig Waters, Craig Waters MR#: [id number]

## 2012-06-21 NOTE — Progress Notes (Signed)
Patient stating he took all of the evening prep last evening with clear results. Am suprep started at 0900 and finished at 1000.Patient stating only small amount of clear results today. Patient pointing to his abd. Stating that he feels he has not finished. Patient ate two saltine crackers this am at 0900 related to nausea. Patient also  Taking a sip of gateraid at 1200. Informed Kathlene November CRNA of po intake. Cleared for procedure today. Patient to bathroom with good results, stating yellow liquid results.Patient stating much relieved.Craig Waters

## 2012-06-21 NOTE — Patient Instructions (Addendum)
The colonoscopy did not show any major problems. I removed two small polyps, saw diverticulosis and hemorrhoids like you had in the past.  Please eat a high fiber diet and take 1-2 doses of MiraLax every day to move your bowels. You may take 1-2 Senokot every 2-3 days to move your bowels if needed.  I will send a letter about the polyps but they look benign (not cancer).  If your bowels aren't moving better after a few weeks let me know and will see what else we can do.  Thank you for choosing me and [facility].  Iva Boop, MD, FACG YOU HAD AN ENDOSCOPIC PROCEDURE TODAY AT THE [facility]: Refer to the procedure report that was given to you for any specific questions about what was found during the examination.  If the procedure report does not answer your questions, please call your gastroenterologist to clarify.  If you requested that your care partner not be given the details of your procedure findings, then the procedure report has been included in a sealed envelope for you to review at your convenience later.  YOU SHOULD EXPECT: Some feelings of bloating in the abdomen. Passage of more gas than usual.  Walking can help get rid of the air that was put into your GI tract during the procedure and reduce the bloating. If you had a lower endoscopy (such as a colonoscopy or flexible sigmoidoscopy) you may notice spotting of blood in your stool or on the toilet paper. If you underwent a bowel prep for your procedure, then you may not have a normal bowel movement for a few days.  DIET: Your first meal following the procedure should be a light meal and then it is ok to progress to your normal diet.  A half-sandwich or bowl of soup is an example of a good first meal.  Heavy or fried foods are harder to digest and may make you feel nauseous or bloated.  Likewise meals heavy in dairy and vegetables can cause extra gas to form and this can also increase the bloating.  Drink  plenty of fluids but you should avoid alcoholic beverages for 24 hours.  ACTIVITY: Your care partner should take you home directly after the procedure.  You should plan to take it easy, moving slowly for the rest of the day.  You can resume normal activity the day after the procedure however you should NOT DRIVE or use heavy machinery for 24 hours (because of the sedation medicines used during the test).    SYMPTOMS TO REPORT IMMEDIATELY: A gastroenterologist can be reached at any hour.  During normal business hours, 8:30 AM to 5:00 PM Monday through Friday, call [phone].  After hours and on weekends, please call the GI answering service at [phone] who will take a message and have the physician on call contact you.   Following lower endoscopy (colonoscopy or flexible sigmoidoscopy):  Excessive amounts of blood in the stool  Significant tenderness or worsening of abdominal pains  Swelling of the abdomen that is new, acute  Fever of 100F or higher  FOLLOW UP: If any biopsies were taken you will be contacted by phone or by letter within the next 1-3 weeks.  Call your gastroenterologist if you have not heard about the biopsies in 3 weeks.  Our staff will call the home number listed on your records the next business day following your procedure to check on you and address any questions or concerns that  you may have at that time regarding the information given to you following your procedure. This is a courtesy call and so if there is no answer at the home number and we have not heard from you through the emergency physician on call, we will assume that you have returned to your regular daily activities without incident.  SIGNATURES/CONFIDENTIALITY: You and/or your care partner have signed paperwork which will be entered into your electronic medical record.  These signatures attest to the fact that that the information above on your After Visit Summary has been reviewed and is understood.   Full responsibility of the confidentiality of this discharge information lies with you and/or your care-partner.   Dr. Leone Payor wants you to eat a high fiber diet, and take miralax 1-2 doses daily.  Senokot as needed every 2-3 days.  Please, call Dr. Leone Payor if this doesn't work.  Thank-you.

## 2012-06-21 NOTE — Progress Notes (Signed)
Called to room to assist during endoscopic procedure.  Patient ID and intended procedure confirmed with present staff. Received instructions for my participation in the procedure from the performing physician. ewm 

## 2012-06-24 ENCOUNTER — Telehealth: Payer: Self-pay

## 2012-06-24 NOTE — Telephone Encounter (Signed)
  Follow up Call-  Call back number 06/21/2012  Post procedure Call Back phone  # [phone]  Permission to leave phone message Yes     Patient questions:  Do you have a fever, pain , or abdominal swelling? no Pain Score  0 *  Have you tolerated food without any problems? yes  Have you been able to return to your normal activities? yes  Do you have any questions about your discharge instructions: Diet   no Medications  no Follow up visit  no  Do you have questions or concerns about your Care? no  Actions: * If pain score is 4 or above: No action needed, pain <4.  The pt did say he felt "rough" this am not because of the colonoscopy.  He said he pulled a muscle in his back 3 weeks ago.  He was having back pain this am.  No problems from the colonoscopy. Maw

## 2012-06-27 ENCOUNTER — Encounter: Payer: Self-pay | Admitting: Internal Medicine

## 2012-07-04 ENCOUNTER — Encounter (HOSPITAL_COMMUNITY): Payer: Self-pay | Admitting: Adult Health

## 2012-07-04 ENCOUNTER — Inpatient Hospital Stay (HOSPITAL_COMMUNITY)
Admission: EM | Admit: 2012-07-04 | Discharge: 2012-07-08 | DRG: 552 | Disposition: A | Discharge status: Home Health | Payer: Medicare Other | Attending: Internal Medicine | Admitting: Internal Medicine

## 2012-07-04 DIAGNOSIS — M4644 Discitis, unspecified, thoracic region: Secondary | ICD-10-CM

## 2012-07-04 DIAGNOSIS — Z860100 Personal history of colon polyps, unspecified: Secondary | ICD-10-CM

## 2012-07-04 DIAGNOSIS — K219 Gastro-esophageal reflux disease without esophagitis: Secondary | ICD-10-CM

## 2012-07-04 DIAGNOSIS — Z8614 Personal history of Methicillin resistant Staphylococcus aureus infection: Secondary | ICD-10-CM

## 2012-07-04 DIAGNOSIS — M519 Unspecified thoracic, thoracolumbar and lumbosacral intervertebral disc disorder: Principal | ICD-10-CM | POA: Diagnosis present

## 2012-07-04 DIAGNOSIS — N400 Enlarged prostate without lower urinary tract symptoms: Secondary | ICD-10-CM

## 2012-07-04 DIAGNOSIS — K279 Peptic ulcer, site unspecified, unspecified as acute or chronic, without hemorrhage or perforation: Secondary | ICD-10-CM

## 2012-07-04 DIAGNOSIS — Z87891 Personal history of nicotine dependence: Secondary | ICD-10-CM

## 2012-07-04 DIAGNOSIS — K5792 Diverticulitis of intestine, part unspecified, without perforation or abscess without bleeding: Secondary | ICD-10-CM

## 2012-07-04 DIAGNOSIS — R4182 Altered mental status, unspecified: Secondary | ICD-10-CM

## 2012-07-04 DIAGNOSIS — E876 Hypokalemia: Secondary | ICD-10-CM

## 2012-07-04 DIAGNOSIS — N390 Urinary tract infection, site not specified: Secondary | ICD-10-CM

## 2012-07-04 DIAGNOSIS — G9340 Encephalopathy, unspecified: Secondary | ICD-10-CM

## 2012-07-04 DIAGNOSIS — I10 Essential (primary) hypertension: Secondary | ICD-10-CM

## 2012-07-04 LAB — BASIC METABOLIC PANEL
CO2: 29 mEq/L (ref 19–32)
Calcium: 9 mg/dL (ref 8.4–10.5)
Glucose, Bld: 109 mg/dL — ABNORMAL HIGH (ref 70–99)
Sodium: 142 mEq/L (ref 135–145)

## 2012-07-04 LAB — CBC WITH DIFFERENTIAL/PLATELET
Eosinophils Relative: 4 % (ref 0–5)
HCT: 40.2 % (ref 39.0–52.0)
Lymphocytes Relative: 17 % (ref 12–46)
Lymphs Abs: 1.3 10*3/uL (ref 0.7–4.0)
MCV: 88.9 fL (ref 78.0–100.0)
Platelets: 273 10*3/uL (ref 150–400)
RBC: 4.52 MIL/uL (ref 4.22–5.81)
WBC: 7.9 10*3/uL (ref 4.0–10.5)

## 2012-07-04 MED ORDER — VANCOMYCIN HCL 10 G IV SOLR: 2000.0000 mg | Freq: Once | INTRAVENOUS | Status: DC

## 2012-07-04 MED ORDER — VANCOMYCIN HCL 10 G IV SOLR
1250.0000 mg | Freq: Two times a day (BID) | INTRAVENOUS | Status: DC
  Filled 2012-07-04: qty 1250

## 2012-07-04 MED ORDER — SODIUM CHLORIDE 0.9 % IV SOLN
INTRAVENOUS | Status: AC
  Administered 2012-07-04: 21:00:00 via INTRAVENOUS

## 2012-07-04 MED ORDER — VANCOMYCIN HCL 10 G IV SOLR
2000.0000 mg | INTRAVENOUS | Status: AC
  Administered 2012-07-04: 2000 mg via INTRAVENOUS
  Filled 2012-07-04: qty 2000

## 2012-07-04 MED ORDER — DEXTROSE 5 % IV SOLN
1.0000 g | INTRAVENOUS | Status: DC
  Administered 2012-07-04: 1 g via INTRAVENOUS
  Filled 2012-07-04: qty 10

## 2012-07-04 MED ORDER — DEXTROSE 5 % IV SOLN
1.0000 g | Freq: Once | INTRAVENOUS | Status: DC
  Filled 2012-07-04: qty 10

## 2012-07-04 MED ORDER — MORPHINE SULFATE 2 MG/ML IJ SOLN: 1.0000 mg | INTRAMUSCULAR | Status: DC | PRN

## 2012-07-04 MED ORDER — HYDROCODONE-ACETAMINOPHEN 5-325 MG PO TABS
1.0000 | ORAL_TABLET | ORAL | Status: DC | PRN
  Administered 2012-07-04 – 2012-07-05 (×3): 1 via ORAL
  Administered 2012-07-05 – 2012-07-06 (×2): 2 via ORAL
  Administered 2012-07-06: 1 via ORAL
  Administered 2012-07-06 – 2012-07-08 (×4): 2 via ORAL
  Filled 2012-07-04: qty 2
  Filled 2012-07-04: qty 1
  Filled 2012-07-04 (×4): qty 2
  Filled 2012-07-04: qty 1
  Filled 2012-07-04 (×3): qty 2
  Filled 2012-07-04: qty 1

## 2012-07-04 NOTE — Progress Notes (Signed)
Pt admitted to the unit. Pt is alert and oriented. Pt oriented to room, staff, and call bell. Bed in lowest position. Full assessment to Epic. Call bell with in reach. Told to call for assists. Will continue to monitor.  MOHAMMED, ARIELLE E  

## 2012-07-04 NOTE — Progress Notes (Signed)
ANTIBIOTIC CONSULT NOTE - INITIAL  Pharmacy Consult for vancomycin Indication: Thoracic discitis/osteomyelitis   Allergies  Allergen Reactions  . Other Other (See Comments)    Pain med-causes hallucinations (unknown drug name)    Patient Measurements: Wt= 99kg  Vital Signs: Temp: 98.5 F (36.9 C) (03/13 1507) Temp src: Oral (03/13 1507) BP: 160/81 mmHg (03/13 2021) Pulse Rate: 88 (03/13 2021) Intake/Output from previous day:   Intake/Output from this shift:    Labs:  Recent Labs  07/04/12 1516  WBC 7.9  HGB 13.5  PLT 273  CREATININE 0.93   The CrCl is unknown because both a height and weight (above a minimum accepted value) are required for this calculation. No results found for this basename: VANCOTROUGH, VANCOPEAK, VANCORANDOM, GENTTROUGH, GENTPEAK, GENTRANDOM, TOBRATROUGH, TOBRAPEAK, TOBRARND, AMIKACINPEAK, AMIKACINTROU, AMIKACIN,  in the last 72 hours   Microbiology: No results found for this or any previous visit (from the past 720 hour(s)).  Medical History: Past Medical History  Diagnosis Date  . Blood in stool   . Diverticulitis   . GERD (gastroesophageal reflux disease)   . Glaucoma   . Ulcer   . History of transfusion of whole blood   . Prostate disorder   . Shortness of breath 10-18-11    with exertion, cardiac cath done 7-8 yrs ago negative.  . Vertigo 10-18-11    inner ear issues occ. -tx. Bonine as needed  . Arthritis 10-18-11    back, fingers  . H/O hiatal hernia 10-18-11    noted on recent CXR  . Adenomatous colon polyp     Assessment: 79 yo male with osteomyelitis/discitis to start on vancomycin (also on rocephin).  SCr= 0.93 with CrCl 55-65 ml/min.  Goal of Therapy:  Vancomycin trough level 15-20 mcg/ml  Plan:  -2000mg  IV x1 followed by 1250mg  IV q12hr -Will follow renal function, cultures and vancomycin levels as needed  Harland German, Pharm D 07/04/2012 9:19 PM

## 2012-07-04 NOTE — ED Provider Notes (Signed)
History     CSN: [id number]  Arrival date & time 07/04/12  1434   First MD Initiated Contact with Patient 07/04/12 1800      Chief Complaint  Patient presents with  . Back Pain    (Consider location/radiation/quality/duration/timing/severity/associated sxs/prior treatment) Patient is a 79 y.o. male presenting with back pain.  Back Pain Location:  Thoracic spine Quality:  Aching Radiates to:  Does not radiate Pain severity:  Moderate Onset quality:  Sudden ("I pulled my back" 5-6 weeks ago) Timing:  Constant Progression:  Unchanged Context: lifting heavy objects   Relieved by:  Nothing Exacerbated by: lying flat. Associated symptoms: no abdominal pain, no chest pain, no fever and no weakness     Past Medical History  Diagnosis Date  . Blood in stool   . Diverticulitis   . GERD (gastroesophageal reflux disease)   . Glaucoma   . Ulcer   . History of transfusion of whole blood   . Prostate disorder   . Shortness of breath 10-18-11    with exertion, cardiac cath done 7-8 yrs ago negative.  . Vertigo 10-18-11    inner ear issues occ. -tx. Bonine as needed  . Arthritis 10-18-11    back, fingers  . H/O hiatal hernia 10-18-11    noted on recent CXR  . Adenomatous colon polyp     Past Surgical History  Procedure Laterality Date  . Gastric ulcer  10-18-11    '91-blleding ulcer with blood transfusions after  . Appendectomy  10-18-11    age 27  . Cataract extraction, bilateral  10-18-11    bilateral   . Eye surgery  10-18-11    laser eye surgery s/p cataract bilaterally  . Cardiac catheterization  10-18-11    7-8 yrs ago-mild blockage,no tx. indicated  . Prostatectomy  10/27/2011    Procedure: PROSTATECTOMY SUPRAPUBIC;  Surgeon: Kathi Ludwig, MD;  Location: [facility];  Service: Urology;  Laterality: N/A;  Placement of suprapubic tube  . Cystoscopy  10/27/2011    Procedure: CYSTOSCOPY FLEXIBLE;  Surgeon: Kathi Ludwig, MD;  Location: [facility];  Service: Urology;   Laterality: N/A;    Family History  Problem Relation Age of Onset  . Breast cancer Sister   . Dementia Mother   . Heart attack Father   . Colon cancer Neg Hx     History  Substance Use Topics  . Smoking status: Former Smoker -- 1.00 packs/day for 5 years    Types: Cigarettes    Quit date: 09/26/1978  . Smokeless tobacco: Current User    Types: Chew    Last Attempt to Quit: 11/01/2011  . Alcohol Use: No      Review of Systems  Constitutional: Negative for fever.  HENT: Negative for congestion, facial swelling and trouble swallowing.   Respiratory: Negative for cough and shortness of breath.   Cardiovascular: Negative for chest pain.  Gastrointestinal: Negative for nausea, vomiting, abdominal pain and diarrhea.  Genitourinary: Negative for difficulty urinating.  Musculoskeletal: Positive for back pain. Negative for gait problem.  Skin: Negative for rash.  Neurological: Negative for weakness.  All other systems reviewed and are negative.    Allergies  Other  Home Medications   Current Outpatient Rx  Name  Route  Sig  Dispense  Refill  . ibuprofen (ADVIL,MOTRIN) 200 MG tablet   Oral   Take 400 mg by mouth every 6 (six) hours as needed. pain  BP 158/75  Pulse 102  Temp(Src) 98.5 F (36.9 C) (Oral)  SpO2 95%  Physical Exam  Nursing note and vitals reviewed. Constitutional: He is oriented to person, place, and time. He appears well-developed and well-nourished. No distress.  HENT:  Head: Normocephalic and atraumatic.  Mouth/Throat: Oropharynx is clear and moist.  Eyes: Conjunctivae are normal. Pupils are equal, round, and reactive to light. No scleral icterus.  Neck: Normal range of motion. Neck supple.  Cardiovascular: Normal rate, regular rhythm, normal heart sounds and intact distal pulses.   No murmur heard. Pulmonary/Chest: Effort normal and breath sounds normal. No stridor. No respiratory distress. He has no wheezes. He has no rales.   Abdominal: Soft. He exhibits no distension. There is no tenderness.  Musculoskeletal: Normal range of motion. He exhibits no edema.  Neurological: He is alert and oriented to person, place, and time. He has normal strength. Gait normal.  Skin: Skin is warm and dry. No rash noted.  Psychiatric: He has a normal mood and affect. His behavior is normal.    ED Course  Procedures (including critical care time)  Labs Reviewed  BASIC METABOLIC PANEL - Abnormal; Notable for the following:    Glucose, Bld 109 (*)    GFR calc non Af Amer 78 (*)    All other components within normal limits  CULTURE, BLOOD (ROUTINE X 2)  CULTURE, BLOOD (ROUTINE X 2)  CBC WITH DIFFERENTIAL  SEDIMENTATION RATE   No results found.   1. Discitis of thoracic region       MDM  79 yo male with back pain for several weeks.  His PCP ordered an MRI today which demonstrated severe spondylodiscitis and a right sided pleural effusion.  Well appearing.  No neuro deficits, no bowel/bladder dysfunction, no perineal numbness.  Plan to talk with neurosurgery.     7:20 PM Spoke with [facility] who rec'd starting Vanco and rocephin and admission to hospitalists.    7:38 PM [facility] to admit.       Rennis Petty, MD 07/05/12 0005

## 2012-07-04 NOTE — Consult Note (Signed)
Reason for Consult: Thoracic discitis/osteomyelitis Referring Physician: Dr. Marye Round is an 79 y.o. male.  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 The patient initially saw a chiropractor, Dr. Selena Batten. Dr. Selena Batten was not able to help the patient. He follows his primary medical doctor Dr. Windle Guard. He was worked up further with a lumbar CT which demonstrated findings concerning for discitis. He was further worked up with a thoracic MRI which demonstrated by report evidence of discitis/osteomyelitis at T6-7. The patient was sent to [facility] for further management.  Presently the patient is alert and pleasant. He tells me his back really isn't bothering him much presently. He denies lower extremity numbness tingling weakness incontinence etc. He denies recent infection, fever, etc.  Past Medical History  Diagnosis Date  . Blood in stool   . Diverticulitis   . GERD (gastroesophageal reflux disease)   . Glaucoma   . Ulcer   . History of transfusion of whole blood   . Prostate disorder   . Shortness of breath 10-18-11    with exertion, cardiac cath done 7-8 yrs ago negative.  . Vertigo 10-18-11    inner ear issues occ. -tx. Bonine as needed  . Arthritis 10-18-11    back, fingers  . H/O hiatal hernia 10-18-11    noted on recent CXR  . Adenomatous colon polyp     Past Surgical History  Procedure Laterality Date  . Gastric ulcer  10-18-11    '91-blleding ulcer with blood transfusions after  . Appendectomy  10-18-11    age 49  . Cataract extraction, bilateral  10-18-11    bilateral   . Eye surgery  10-18-11    laser eye surgery s/p cataract bilaterally  . Cardiac catheterization  10-18-11    7-8 yrs ago-mild blockage,no tx. indicated  . Prostatectomy  10/27/2011    Procedure: PROSTATECTOMY SUPRAPUBIC;  Surgeon: Kathi Ludwig, MD;  Location: [facility];  Service: Urology;  Laterality:  N/A;  Placement of suprapubic tube  . Cystoscopy  10/27/2011    Procedure: CYSTOSCOPY FLEXIBLE;  Surgeon: Kathi Ludwig, MD;  Location: [facility];  Service: Urology;  Laterality: N/A;    Family History  Problem Relation Age of Onset  . Breast cancer Sister   . Dementia Mother   . Heart attack Father   . Colon cancer Neg Hx     Social History:  reports that he quit smoking about 33 years ago. His smoking use included Cigarettes. He has a 5 pack-year smoking history. His smokeless tobacco use includes Chew. He reports that he does not drink alcohol or use illicit drugs.  Allergies:  Allergies  Allergen Reactions  . Other Other (See Comments)    Pain med-causes hallucinations (unknown drug name)    Medications:  I have reviewed the patient's current medications. Prior to Admission:  (Not in a hospital admission) Scheduled: Continuous: PRN:   Anti-infectives   Start     Dose/Rate Route Frequency Ordered Stop   07/04/12 1915  vancomycin (VANCOCIN) 2,000 mg in sodium chloride 0.9 % 500 mL IVPB  Status:  Discontinued     2,000 mg 250 mL/hr over 120 Minutes Intravenous  Once 07/04/12 1905 07/04/12 1937   07/04/12 1915  cefTRIAXone (ROCEPHIN) 1 g in dextrose 5 % 50 mL IVPB  Status:  Discontinued     1 g 100 mL/hr over  30 Minutes Intravenous  Once 07/04/12 1905 07/04/12 1937       Results for orders placed during the hospital encounter of 07/04/12 (from the past 48 hour(s))  CBC WITH DIFFERENTIAL     Status: None   Collection Time    07/04/12  3:16 PM      Result Value Range   WBC 7.9  4.0 - 10.5 K/uL   RBC 4.52  4.22 - 5.81 MIL/uL   Hemoglobin 13.5  13.0 - 17.0 g/dL   HCT 16.1  09.6 - 04.5 %   MCV 88.9  78.0 - 100.0 fL   MCH 29.9  26.0 - 34.0 pg   MCHC 33.6  30.0 - 36.0 g/dL   RDW 40.9  81.1 - 91.4 %   Platelets 273  150 - 400 K/uL   Neutrophils Relative 72  43 - 77 %   Neutro Abs 5.7  1.7 - 7.7 K/uL   Lymphocytes Relative 17  12 - 46 %   Lymphs Abs 1.3  0.7 - 4.0  K/uL   Monocytes Relative 6  3 - 12 %   Monocytes Absolute 0.5  0.1 - 1.0 K/uL   Eosinophils Relative 4  0 - 5 %   Eosinophils Absolute 0.3  0.0 - 0.7 K/uL   Basophils Relative 0  0 - 1 %   Basophils Absolute 0.0  0.0 - 0.1 K/uL  BASIC METABOLIC PANEL     Status: Abnormal   Collection Time    07/04/12  3:16 PM      Result Value Range   Sodium 142  135 - 145 mEq/L   Potassium 3.7  3.5 - 5.1 mEq/L   Chloride 104  96 - 112 mEq/L   CO2 29  19 - 32 mEq/L   Glucose, Bld 109 (*) 70 - 99 mg/dL   BUN 15  6 - 23 mg/dL   Creatinine, Ser 7.82  0.50 - 1.35 mg/dL   Calcium 9.0  8.4 - 95.6 mg/dL   GFR calc non Af Amer 78 (*) >90 mL/min   GFR calc Af Amer >90  >90 mL/min   Comment:            The eGFR has been calculated     using the CKD EPI equation.     This calculation has not been     validated in all clinical     situations.     eGFR's persistently     <90 mL/min signify     possible Chronic Kidney Disease.    No results found.  Review of systems: As above Blood pressure 158/75, pulse 102, temperature 98.5 F (36.9 C), temperature source Oral, SpO2 95.00%. Physical exam:  Gen.: An alert and pleasant healthy-appearing 79 year old white male in no apparent stress.  HEENT: Normocephalic, atraumatic, pupils are miotic and reactive bilaterally. Has bilateral arcus senilis. Extraocular muscles are intact. Pharynx benign. He's wearing dentures.  Neck: Supple without masses deformities tracheal deviation he has age-appropriate normal cervical range of motion.  Thorax: Symmetric without point tenderness or deformities. He complains of pain in approximately the mid to lower thoracic region.  Abdomen: Soft  Extremities: No obvious deformities  Neurologic exam: The patient is alert and oriented x3. Glasgow Coma Scale 15. Cranial nerves II through XII are examined bilaterally and grossly normal. Vision and hearing are grossly normal bilaterally. The patient's motor strength is 5 over 5  his bilateral deltoid, biceps, triceps, hand grip, psoas, quadriceps, gastrocnemius, dorsi flexors. Sensory  function is intact to light touch sensation all tested dermatomes bilaterally. Deep tendon reflexes are normal and symmetric. There is no ankle clonus. Cerebellar function is intact to rapid alternating movements of the upper extremities bilaterally.  Unfortunately the patient did not bring a CD of his thoracic MRI with him. We do not have access to [facility] imaging films here. By report the patient has a T67 osteomyelitis/discitis  Assessment/Plan: T6-7 osteomyelitis/discitis: I discussed the situation with the patient and Dr. Loretha Stapler. The patient is going to be admitted by the medical service. We will get blood cultures, CBC, sedimentation rate, etc. he needs to be started on vancomycin and Rocephin empirically. We will need to consult infectious diseases tomorrow. If the blood cultures are negative he may need a needle aspiration of the disc space. Clinically the patient does not need surgery presently has a strength is normal. I will follow the patient with you.  JENKINS,JEFFREY D 07/04/2012, 7:35 PM

## 2012-07-04 NOTE — H&P (Signed)
PCP:   Kaleen Mask, MD   Chief Complaint:  Back pain and abnormal mri  HPI: 79 yo male with over one month of lower back pain. Went to pcp who did outpt mri which showed discitis, pt instructed to come to ED.  No fevers.  No urinary or bowel incont.  No weakness in legs.  No rashes.  No n/v/d.  He did have several polyps removed during a colonoscopy in feb 2014.  Review of Systems:  Positive and negative as per HPI otherwise all other systems are negative  Past Medical History: Past Medical History  Diagnosis Date  . Blood in stool   . Diverticulitis   . GERD (gastroesophageal reflux disease)   . Glaucoma   . Ulcer   . History of transfusion of whole blood   . Prostate disorder   . Shortness of breath 10-18-11    with exertion, cardiac cath done 7-8 yrs ago negative.  . Vertigo 10-18-11    inner ear issues occ. -tx. Bonine as needed  . Arthritis 10-18-11    back, fingers  . H/O hiatal hernia 10-18-11    noted on recent CXR  . Adenomatous colon polyp    Past Surgical History  Procedure Laterality Date  . Gastric ulcer  10-18-11    '91-blleding ulcer with blood transfusions after  . Appendectomy  10-18-11    age 4  . Cataract extraction, bilateral  10-18-11    bilateral   . Eye surgery  10-18-11    laser eye surgery s/p cataract bilaterally  . Cardiac catheterization  10-18-11    7-8 yrs ago-mild blockage,no tx. indicated  . Prostatectomy  10/27/2011    Procedure: PROSTATECTOMY SUPRAPUBIC;  Surgeon: Kathi Ludwig, MD;  Location: [facility];  Service: Urology;  Laterality: N/A;  Placement of suprapubic tube  . Cystoscopy  10/27/2011    Procedure: CYSTOSCOPY FLEXIBLE;  Surgeon: Kathi Ludwig, MD;  Location: [facility];  Service: Urology;  Laterality: N/A;    Medications: Prior to Admission medications   Medication Sig Start Date End Date Taking? Authorizing Provider  ibuprofen (ADVIL,MOTRIN) 200 MG tablet Take 600 mg by mouth daily as needed. For pain   Yes  Historical Provider, MD  OVER THE COUNTER MEDICATION Apply 1 application topically every other day as needed. Medication: Herbal Horse Liniment  Apply as needed for pain   Yes Historical Provider, MD  traMADol (ULTRAM) 50 MG tablet Take 50 mg by mouth every 6 (six) hours as needed for pain. For pain   Yes Historical Provider, MD    Allergies:   Allergies  Allergen Reactions  . Other Other (See Comments)    Pain med-causes hallucinations (unknown drug name)    Social History:  reports that he quit smoking about 33 years ago. His smoking use included Cigarettes. He has a 5 pack-year smoking history. His smokeless tobacco use includes Chew. He reports that he does not drink alcohol or use illicit drugs.  Family History: Family History  Problem Relation Age of Onset  . Breast cancer Sister   . Dementia Mother   . Heart attack Father   . Colon cancer Neg Hx     Physical Exam: Filed Vitals:   07/04/12 1507 07/04/12 2000 07/04/12 2021  BP: 158/75 160/81 160/81  Pulse: 102 84 88  Temp: 98.5 F (36.9 C)    TempSrc: Oral    Resp:   18  SpO2: 95% 95% 98%   General appearance: alert, cooperative and no  distress Head: Normocephalic, without obvious abnormality, atraumatic Eyes: negative Nose: Nares normal. Septum midline. Mucosa normal. No drainage or sinus tenderness. Neck: no JVD and supple, symmetrical, trachea midline Lungs: clear to auscultation bilaterally Heart: regular rate and rhythm, S1, S2 normal, no murmur, click, rub or gallop Abdomen: soft, non-tender; bowel sounds normal; no masses,  no organomegaly Extremities: extremities normal, atraumatic, no cyanosis or edema Pulses: 2+ and symmetric Skin: Skin color, texture, turgor normal. No rashes or lesions Neurologic: Grossly normal    Labs on Admission:   Recent Labs  07/04/12 1516  NA 142  K 3.7  CL 104  CO2 29  GLUCOSE 109*  BUN 15  CREATININE 0.93  CALCIUM 9.0    Recent Labs  07/04/12 1516  WBC  7.9  NEUTROABS 5.7  HGB 13.5  HCT 40.2  MCV 88.9  PLT 273    Radiological Exams on Admission: No results found.  Assessment/Plan  79 yo male with thoracic discitis Principal Problem:   Discitis of thoracic region Active Problems:   BPH (benign prostatic hyperplasia)   PUD (peptic ulcer disease)   GERD (gastroesophageal reflux disease)   HTN (hypertension)  Admit to med bed.  Attempted to call ID on call for over an hour without success.  Will go ahead and give vanc/rocephin.  Will need to call ID again in the am.  Will order PICC.  Sharnika Binney A 07/04/2012, 8:23 PM

## 2012-07-04 NOTE — ED Notes (Signed)
Pt was sent for a MRI due to thoracic back pain, MRI completed at [facility] and pt sent here due to "Severe T6 and T7 Spondylodiscitis and right pleural effusion. Pt denies pain at this time. Sats are 94% on RA. Denies SOB. Pt is alert and oriented and speaking in full sentences.

## 2012-07-05 ENCOUNTER — Other Ambulatory Visit (HOSPITAL_COMMUNITY): Payer: Medicare Other

## 2012-07-05 ENCOUNTER — Encounter (HOSPITAL_COMMUNITY): Payer: Self-pay | Admitting: *Deleted

## 2012-07-05 ENCOUNTER — Inpatient Hospital Stay (HOSPITAL_COMMUNITY): Payer: Medicare Other

## 2012-07-05 DIAGNOSIS — M519 Unspecified thoracic, thoracolumbar and lumbosacral intervertebral disc disorder: Secondary | ICD-10-CM

## 2012-07-05 LAB — PROTIME-INR
INR: 1.11 (ref 0.00–1.49)
Prothrombin Time: 14.2 seconds (ref 11.6–15.2)

## 2012-07-05 LAB — MRSA PCR SCREENING: MRSA by PCR: POSITIVE — AB

## 2012-07-05 MED ORDER — FENTANYL CITRATE 0.05 MG/ML IJ SOLN
INTRAMUSCULAR | Status: AC
  Filled 2012-07-05: qty 4

## 2012-07-05 MED ORDER — AMLODIPINE BESYLATE 5 MG PO TABS
5.0000 mg | ORAL_TABLET | Freq: Every day | ORAL | Status: DC
  Administered 2012-07-05 – 2012-07-08 (×4): 5 mg via ORAL
  Filled 2012-07-05 (×4): qty 1

## 2012-07-05 MED ORDER — CHLORHEXIDINE GLUCONATE CLOTH 2 % EX PADS
6.0000 | MEDICATED_PAD | Freq: Every day | CUTANEOUS | Status: DC
  Administered 2012-07-06 – 2012-07-08 (×3): 6 via TOPICAL

## 2012-07-05 MED ORDER — FENTANYL CITRATE 0.05 MG/ML IJ SOLN
INTRAMUSCULAR | Status: AC | PRN
  Administered 2012-07-05 (×4): 50 ug via INTRAVENOUS

## 2012-07-05 MED ORDER — PANTOPRAZOLE SODIUM 40 MG PO TBEC
40.0000 mg | DELAYED_RELEASE_TABLET | Freq: Every day | ORAL | Status: DC
  Administered 2012-07-05 – 2012-07-08 (×4): 40 mg via ORAL
  Filled 2012-07-05 (×5): qty 1

## 2012-07-05 MED ORDER — FENTANYL CITRATE 0.05 MG/ML IJ SOLN
INTRAMUSCULAR | Status: AC
  Filled 2012-07-05: qty 2

## 2012-07-05 MED ORDER — CEFTRIAXONE SODIUM 2 G IJ SOLR
2.0000 g | INTRAMUSCULAR | Status: DC
  Filled 2012-07-05: qty 2

## 2012-07-05 MED ORDER — VANCOMYCIN HCL 10 G IV SOLR
1250.0000 mg | Freq: Two times a day (BID) | INTRAVENOUS | Status: DC
  Administered 2012-07-05 – 2012-07-08 (×5): 1250 mg via INTRAVENOUS
  Filled 2012-07-05 (×7): qty 1250

## 2012-07-05 MED ORDER — DEXTROSE 5 % IV SOLN
2.0000 g | INTRAVENOUS | Status: DC
  Filled 2012-07-05: qty 2

## 2012-07-05 MED ORDER — MUPIROCIN 2 % EX OINT
1.0000 "application " | TOPICAL_OINTMENT | Freq: Two times a day (BID) | CUTANEOUS | Status: DC
  Administered 2012-07-05 – 2012-07-08 (×6): 1 via NASAL
  Filled 2012-07-05: qty 22

## 2012-07-05 MED ORDER — DEXTROSE 5 % IV SOLN
2.0000 g | INTRAVENOUS | Status: DC
  Administered 2012-07-05 – 2012-07-07 (×3): 2 g via INTRAVENOUS
  Filled 2012-07-05 (×4): qty 2

## 2012-07-05 MED ORDER — MIDAZOLAM HCL 2 MG/2ML IJ SOLN
INTRAMUSCULAR | Status: AC
  Filled 2012-07-05: qty 2

## 2012-07-05 MED ORDER — MIDAZOLAM HCL 2 MG/2ML IJ SOLN
INTRAMUSCULAR | Status: AC
  Filled 2012-07-05: qty 4

## 2012-07-05 MED ORDER — MIDAZOLAM HCL 2 MG/2ML IJ SOLN
INTRAMUSCULAR | Status: AC | PRN
  Administered 2012-07-05 (×3): 1 mg via INTRAVENOUS
  Administered 2012-07-05: 2 mg via INTRAVENOUS

## 2012-07-05 NOTE — Progress Notes (Signed)
Patient ID: Craig Waters, male   DOB: February 27, 1931, 79 y.o.   MRN: [id number] Subjective:  The patient is alert and pleasant. He looks well. He denies back pain.  Objective: Vital signs in last 24 hours: Temp:  [98.3 F (36.8 C)-98.5 F (36.9 C)] 98.4 F (36.9 C) (03/14 0523) Pulse Rate:  [84-102] 91 (03/14 0523) Resp:  [18-22] 22 (03/14 0523) BP: (146-160)/(62-83) 155/62 mmHg (03/14 0523) SpO2:  [94 %-98 %] 98 % (03/13 2205) Weight:  [92.3 kg (203 lb 7.8 oz)] 92.3 kg (203 lb 7.8 oz) (03/13 2205)  Intake/Output from previous day:   Intake/Output this shift:    Physical exam patient is alert and oriented. His lower extremity strength is grossly normal.  Lab Results:  Recent Labs  07/04/12 1516  WBC 7.9  HGB 13.5  HCT 40.2  PLT 273   BMET  Recent Labs  07/04/12 1516  NA 142  K 3.7  CL 104  CO2 29  GLUCOSE 109*  BUN 15  CREATININE 0.93  CALCIUM 9.0    Studies/Results: No results found.  Assessment/Plan: Presumed thoracic osteomyelitis/discitis: The patient's daughter is to bring the disc from [facility] today so we can review the films. I will have Rocephin to the patient's vancomycin pending infectious disease input. We are awaiting blood cultures. The patient can eat from my point of view. I.e. I don't plan surgery.  LOS: 1 day     Maryanne Huneycutt D 07/05/2012, 7:51 AM

## 2012-07-05 NOTE — Consult Note (Addendum)
    [facility]     Reason for Consult: epidural abscess    Referring Physician: Dr. Jerral Ralph  Principal Problem:   Discitis of thoracic region Active Problems:   BPH (benign prostatic hyperplasia)   PUD (peptic ulcer disease)   GERD (gastroesophageal reflux disease)   HTN (hypertension)      Recommendations: Hold antibiotics pending IR guided biopsy; can start vancomycin per pharmacy and ceftriaxone 2 grams daily after Send cultures for bacterial gram stain and culture, AFB smear and culture, fungal smear and culture   Dr. Orvan Falconer available over the weekend if needed  Assessment: Epidural abscess per report.  Ideally we will be able to get a biopsy prior to any further antibiotic use.     Antibiotics: Vancomycin x 1 dose  HPI: Craig Waters is a 79 y.o. male who recently experienced a back strain in his upper back and persisted despite conservative therapy including chiropractic intervention.  He was sent for an MRI and it shows T5-6 epidural abscess.  He has had no fever, some mild chills.  No history of back surgery, no antibiotic allergies.  Blood cultures sent.     Review of Systems: Pertinent items are noted in HPI.  Past Medical History  Diagnosis Date  . Blood in stool   . Diverticulitis   . GERD (gastroesophageal reflux disease)   . Glaucoma   . Ulcer   . History of transfusion of whole blood   . Prostate disorder   . Shortness of breath 10-18-11    with exertion, cardiac cath done 7-8 yrs ago negative.  . Vertigo 10-18-11    inner ear issues occ. -tx. Bonine as needed  . Arthritis 10-18-11    back, fingers  . H/O hiatal hernia 10-18-11    noted on recent CXR  . Adenomatous colon polyp     History  Substance Use Topics  . Smoking status: Former Smoker -- 1.00 packs/day for 5 years    Types: Cigarettes    Quit date: 09/26/1978  . Smokeless tobacco: Current User    Types: Chew    Last Attempt to Quit: 11/01/2011  .  Alcohol Use: No    Family History  Problem Relation Age of Onset  . Breast cancer Sister   . Dementia Mother   . Heart attack Father   . Colon cancer Neg Hx    Allergies  Allergen Reactions  . Other Other (See Comments)    Pain med-causes hallucinations (unknown drug name)    OBJECTIVE: Blood pressure 155/62, pulse 91, temperature 98.4 F (36.9 C), temperature source Oral, resp. rate 22, height 5\' 9"  (1.753 m), weight 203 lb 7.8 oz (92.3 kg), SpO2 98.00%. General: AAO x 3, nad Skin: no rashes Lungs: CTA B Cor: RRR without m/r/g Abdomen: soft, ntnd, +bs Ext: no edema  Microbiology: No results found for this or any previous visit (from the past 240 hour(s)).  Staci Righter, MD [facility] [phone] pager  [phone] cell 07/05/2012, 10:19 AM

## 2012-07-05 NOTE — Progress Notes (Signed)
Patient evaluated for long-term disease management services with [facility] Management Program as a benefit of his [employer]. Spoke with patient at bedside to explain that he will receive a post discharge transition of care call upon discharge. Confirmed best contact number. Reports he lives with his wife and plans to return home.Left [facility] Care Management brochure and contact information at bedside.  Craig Noble, MSN-Ed, RN,BSN, [facility], [phone]

## 2012-07-05 NOTE — Procedures (Signed)
T6/7 disc aspirated with 20g Franseen needle from a right posteral approach under fluoroscopic guidance.  Three passes.  4cc of sanguinous material sent for gram stain and culture.  No complications.

## 2012-07-05 NOTE — Progress Notes (Signed)
ANTIBIOTIC CONSULT NOTE - INITIAL  Pharmacy Consult for vancomycin Indication: Thoracic discitis/osteomyelitis   Allergies  Allergen Reactions  . Other Other (See Comments)    Pain med-causes hallucinations (unknown drug name)    Patient Measurements: Wt= 99kg  Vital Signs: Temp: 97.3 F (36.3 C) (03/14 1459) Temp src: Oral (03/14 1459) BP: 154/79 mmHg (03/14 1657) Pulse Rate: 94 (03/14 1657) Intake/Output from previous day:   Intake/Output from this shift:    Labs:  Recent Labs  07/04/12 1516  WBC 7.9  HGB 13.5  PLT 273  CREATININE 0.93   Estimated Creatinine Clearance: 72.2 ml/min (by C-G formula based on Cr of 0.93). No results found for this basename: VANCOTROUGH, Leodis Binet, VANCORANDOM, GENTTROUGH, GENTPEAK, GENTRANDOM, TOBRATROUGH, TOBRAPEAK, TOBRARND, AMIKACINPEAK, AMIKACINTROU, AMIKACIN,  in the last 72 hours   Microbiology: Recent Results (from the past 720 hour(s))  MRSA PCR SCREENING     Status: Abnormal   Collection Time    07/05/12  9:20 AM      Result Value Range Status   MRSA by PCR POSITIVE (*) NEGATIVE Final   Comment:            The GeneXpert MRSA Assay (FDA     approved for NASAL specimens     only), is one component of a     comprehensive MRSA colonization     surveillance program. It is not     intended to diagnose MRSA     infection nor to guide or     monitor treatment for     MRSA infections.     RESULT CALLED TO, READ BACK BY AND VERIFIED WITH:     Hilarie Fredrickson RN 12:15 07/05/12 ([email or web address])    Medical History: Past Medical History  Diagnosis Date  . Blood in stool   . Diverticulitis   . GERD (gastroesophageal reflux disease)   . Glaucoma   . Ulcer   . History of transfusion of whole blood   . Prostate disorder   . Shortness of breath 10-18-11    with exertion, cardiac cath done 7-8 yrs ago negative.  . Vertigo 10-18-11    inner ear issues occ. -tx. Bonine as needed  . Arthritis 10-18-11    back, fingers  . H/O hiatal hernia  10-18-11    noted on recent CXR  . Adenomatous colon polyp     Assessment: 79 yo male with osteomyelitis/discitis, s/p disc aspiration today. pharmacy was consulted to start vancomycin yesterday, but vancomycin has been on hold this morning per MD for culture collection. Patient received vancomycin 2000 mg loading dose last night. Afebrile, wbc wnl, est. crcl ~ 60ml/min. Will also start rocephin.  3/14 MRSA PCR positive 3/14 Disc aspiration culture pending 3/13 Blood Cx x 2 Pending  Goal of Therapy:  Vancomycin trough level 15-20 mcg/ml  Plan:  -Vancomycin 1250mg  IV Q 12hrs -Will follow renal function, cultures and vancomycin levels as needed  Bayard Hugger, PharmD, BCPS  Clinical Pharmacist  Pager: [phone]   07/05/2012 5:50 PM

## 2012-07-05 NOTE — ED Provider Notes (Signed)
I saw and evaluated the patient, reviewed the resident's note and I agree with the findings and plan. Pt with mid to lower back pain. Spine nt. Outside ct/mri w ?disciitis. Ns consult. Admit. Iv abx.   Suzi Roots, MD 07/05/12 [phone]

## 2012-07-05 NOTE — Progress Notes (Signed)
PATIENT DETAILS Name: Craig Waters Age: 79 y.o. Sex: male Date of Birth: 1930/06/01 Admit Date: 07/04/2012 Admitting Physician Tarry Kos, MD [id number] Joelene Millin, MD  Subjective: Admitted with back pain-outpatient MRI positive for presumed thoracic diskitis  Assessment/Plan: Principal Problem:   Discitis of thoracic region -afebrile, no leukocytosis-no neuro deficits. No urgent need for immediate antibiotics-feel we can try to get bx first. -Stop all antibiotics -NPO-IR eval for thoracic disk bx -ID eval -prn Norco  HTN -will start Amlodipine while inpatient-adjust as necessary depending on the BP readings  GERD -PPI  Disposition: Remain inpatient  DVT Prophylaxis: SCD's  Code Status: Full code   Procedures:  None  CONSULTS:  ID Neurosurgery IR  PHYSICAL EXAM: Vital signs in last 24 hours: Filed Vitals:   07/04/12 2205 07/05/12 0523 07/05/12 0900 07/05/12 0905  BP: 154/83 155/62 184/79 176/86  Pulse: 91 91 88 80  Temp: 98.3 F (36.8 C) 98.4 F (36.9 C) 98 F (36.7 C)   TempSrc:  Oral Oral   Resp: 20 22    Height: 5\' 9"  (1.753 m)     Weight: 92.3 kg (203 lb 7.8 oz)     SpO2: 98%  95%     Weight change:  Body mass index is 30.04 kg/(m^2).   Gen Exam: Awake and alert with clear speech.   Neck: Supple, No JVD.   Chest: B/L Clear.   CVS: S1 S2 Regular, no murmurs.  Abdomen: soft, BS +, non tender, non distended.  Extremities: no edema, lower extremities warm to touch. Neurologic: Non Focal.   Skin: No Rash.   Wounds: N/A.    Intake/Output from previous day:  Intake/Output Summary (Last 24 hours) at 07/05/12 1114 Last data filed at 07/04/12 2151  Gross per 24 hour  Intake      0 ml  Output      0 ml  Net      0 ml     LAB RESULTS: CBC  Recent Labs Lab 07/04/12 1516  WBC 7.9  HGB 13.5  HCT 40.2  PLT 273  MCV 88.9  MCH 29.9  MCHC 33.6  RDW 13.3  LYMPHSABS 1.3  MONOABS 0.5  EOSABS 0.3  BASOSABS 0.0     Chemistries   Recent Labs Lab 07/04/12 1516  NA 142  K 3.7  CL 104  CO2 29  GLUCOSE 109*  BUN 15  CREATININE 0.93  CALCIUM 9.0    CBG: No results found for this basename: GLUCAP,  in the last 168 hours  GFR Estimated Creatinine Clearance: 72.2 ml/min (by C-G formula based on Cr of 0.93).  Coagulation profile  Recent Labs Lab 07/05/12 0810  INR 1.11    Cardiac Enzymes No results found for this basename: CK, CKMB, TROPONINI, MYOGLOBIN,  in the last 168 hours  No components found with this basename: POCBNP,  No results found for this basename: DDIMER,  in the last 72 hours No results found for this basename: HGBA1C,  in the last 72 hours No results found for this basename: CHOL, HDL, LDLCALC, TRIG, CHOLHDL, LDLDIRECT,  in the last 72 hours No results found for this basename: TSH, T4TOTAL, FREET3, T3FREE, THYROIDAB,  in the last 72 hours No results found for this basename: VITAMINB12, FOLATE, FERRITIN, TIBC, IRON, RETICCTPCT,  in the last 72 hours No results found for this basename: LIPASE, AMYLASE,  in the last 72 hours  Urine Studies No results found for this basename: UACOL, UAPR, USPG, UPH, UTP, UGL, UKET, UBIL,  UHGB, UNIT, UROB, ULEU, UEPI, UWBC, URBC, UBAC, CAST, CRYS, UCOM, BILUA,  in the last 72 hours  MICROBIOLOGY: No results found for this or any previous visit (from the past 240 hour(s)).  RADIOLOGY STUDIES/RESULTS: No results found.  MEDICATIONS: Scheduled Meds:  Continuous Infusions:  PRN Meds:.HYDROcodone-acetaminophen, morphine injection  Antibiotics: Anti-infectives   Start     Dose/Rate Route Frequency Ordered Stop   07/05/12 1000  vancomycin (VANCOCIN) 1,250 mg in sodium chloride 0.9 % 250 mL IVPB  Status:  Discontinued     1,250 mg 166.7 mL/hr over 90 Minutes Intravenous Every 12 hours 07/04/12 2151 07/05/12 0721   07/05/12 0830  cefTRIAXone (ROCEPHIN) 2 g in dextrose 5 % 50 mL IVPB  Status:  Discontinued     2 g 100 mL/hr over 30  Minutes Intravenous Every 24 hours 07/05/12 0751 07/05/12 0753   07/04/12 2130  vancomycin (VANCOCIN) 2,000 mg in sodium chloride 0.9 % 500 mL IVPB     2,000 mg 250 mL/hr over 120 Minutes Intravenous NOW 07/04/12 2120 07/05/12 0008   07/04/12 2100  cefTRIAXone (ROCEPHIN) 1 g in dextrose 5 % 50 mL IVPB  Status:  Discontinued     1 g 100 mL/hr over 30 Minutes Intravenous Every 24 hours 07/04/12 2059 07/05/12 0721   07/04/12 1915  vancomycin (VANCOCIN) 2,000 mg in sodium chloride 0.9 % 500 mL IVPB  Status:  Discontinued     2,000 mg 250 mL/hr over 120 Minutes Intravenous  Once 07/04/12 1905 07/04/12 1937   07/04/12 1915  cefTRIAXone (ROCEPHIN) 1 g in dextrose 5 % 50 mL IVPB  Status:  Discontinued     1 g 100 mL/hr over 30 Minutes Intravenous  Once 07/04/12 1905 07/04/12 Velvet Bathe, MD  [facility] Pager:[phone]  If 7PM-7AM, please contact night-coverage [email or web address] Password [email or web address] 07/05/2012, 11:14 AM   LOS: 1 day

## 2012-07-05 NOTE — Progress Notes (Signed)
Utilization review completed. Joanathan Affeldt, RN, BSN. 

## 2012-07-05 NOTE — H&P (Signed)
Craig Waters is an 79 y.o. male.   Chief Complaint: back pain x 1 mo Denies neuro sxs; urinary or bowel sxs MRI shows Thoracic 6-7 discitis Request for disc aspiration per [facility] and Infectious disease Scheduled for T6-7 disc aspiration HPI: +MRSA by PCR(contact precautions); diverticulitis; glaucoma; BPH; HTN  Past Medical History  Diagnosis Date  . Blood in stool   . Diverticulitis   . GERD (gastroesophageal reflux disease)   . Glaucoma   . Ulcer   . History of transfusion of whole blood   . Prostate disorder   . Shortness of breath 10-18-11    with exertion, cardiac cath done 7-8 yrs ago negative.  . Vertigo 10-18-11    inner ear issues occ. -tx. Bonine as needed  . Arthritis 10-18-11    back, fingers  . H/O hiatal hernia 10-18-11    noted on recent CXR  . Adenomatous colon polyp     Past Surgical History  Procedure Laterality Date  . Gastric ulcer  10-18-11    '91-blleding ulcer with blood transfusions after  . Appendectomy  10-18-11    age 62  . Cataract extraction, bilateral  10-18-11    bilateral   . Eye surgery  10-18-11    laser eye surgery s/p cataract bilaterally  . Cardiac catheterization  10-18-11    7-8 yrs ago-mild blockage,no tx. indicated  . Prostatectomy  10/27/2011    Procedure: PROSTATECTOMY SUPRAPUBIC;  Surgeon: Kathi Ludwig, MD;  Location: [facility];  Service: Urology;  Laterality: N/A;  Placement of suprapubic tube  . Cystoscopy  10/27/2011    Procedure: CYSTOSCOPY FLEXIBLE;  Surgeon: Kathi Ludwig, MD;  Location: [facility];  Service: Urology;  Laterality: N/A;    Family History  Problem Relation Age of Onset  . Breast cancer Sister   . Dementia Mother   . Heart attack Father   . Colon cancer Neg Hx    Social History:  reports that he quit smoking about 33 years ago. His smoking use included Cigarettes. He has a 5 pack-year smoking history. His smokeless tobacco use includes Chew. He reports that he does not drink alcohol or use illicit  drugs.  Allergies:  Allergies  Allergen Reactions  . Other Other (See Comments)    Pain med-causes hallucinations (unknown drug name)    Medications Prior to Admission  Medication Sig Dispense Refill  . ibuprofen (ADVIL,MOTRIN) 200 MG tablet Take 600 mg by mouth daily as needed. For pain      . OVER THE COUNTER MEDICATION Apply 1 application topically every other day as needed. Medication: Herbal Horse Liniment  Apply as needed for pain      . traMADol (ULTRAM) 50 MG tablet Take 50 mg by mouth every 6 (six) hours as needed for pain. For pain        Results for orders placed during the hospital encounter of 07/04/12 (from the past 48 hour(s))  CBC WITH DIFFERENTIAL     Status: None   Collection Time    07/04/12  3:16 PM      Result Value Range   WBC 7.9  4.0 - 10.5 K/uL   RBC 4.52  4.22 - 5.81 MIL/uL   Hemoglobin 13.5  13.0 - 17.0 g/dL   HCT 40.9  81.1 - 91.4 %   MCV 88.9  78.0 - 100.0 fL   MCH 29.9  26.0 - 34.0 pg   MCHC 33.6  30.0 - 36.0 g/dL   RDW 13.3  11.5 - 15.5 %   Platelets 273  150 - 400 K/uL   Neutrophils Relative 72  43 - 77 %   Neutro Abs 5.7  1.7 - 7.7 K/uL   Lymphocytes Relative 17  12 - 46 %   Lymphs Abs 1.3  0.7 - 4.0 K/uL   Monocytes Relative 6  3 - 12 %   Monocytes Absolute 0.5  0.1 - 1.0 K/uL   Eosinophils Relative 4  0 - 5 %   Eosinophils Absolute 0.3  0.0 - 0.7 K/uL   Basophils Relative 0  0 - 1 %   Basophils Absolute 0.0  0.0 - 0.1 K/uL  BASIC METABOLIC PANEL     Status: Abnormal   Collection Time    07/04/12  3:16 PM      Result Value Range   Sodium 142  135 - 145 mEq/L   Potassium 3.7  3.5 - 5.1 mEq/L   Chloride 104  96 - 112 mEq/L   CO2 29  19 - 32 mEq/L   Glucose, Bld 109 (*) 70 - 99 mg/dL   BUN 15  6 - 23 mg/dL   Creatinine, Ser 4.40  0.50 - 1.35 mg/dL   Calcium 9.0  8.4 - 10.2 mg/dL   GFR calc non Af Amer 78 (*) >90 mL/min   GFR calc Af Amer >90  >90 mL/min   Comment:            The eGFR has been calculated     using the CKD EPI  equation.     This calculation has not been     validated in all clinical     situations.     eGFR's persistently     <90 mL/min signify     possible Chronic Kidney Disease.  SEDIMENTATION RATE     Status: Abnormal   Collection Time    07/04/12  7:25 PM      Result Value Range   Sed Rate 47 (*) 0 - 16 mm/hr  PROTIME-INR     Status: None   Collection Time    07/05/12  8:10 AM      Result Value Range   Prothrombin Time 14.2  11.6 - 15.2 seconds   INR 1.11  0.00 - 1.49  MRSA PCR SCREENING     Status: Abnormal   Collection Time    07/05/12  9:20 AM      Result Value Range   MRSA by PCR POSITIVE (*) NEGATIVE   Comment:            The GeneXpert MRSA Assay (FDA     approved for NASAL specimens     only), is one component of a     comprehensive MRSA colonization     surveillance program. It is not     intended to diagnose MRSA     infection nor to guide or     monitor treatment for     MRSA infections.     RESULT CALLED TO, READ BACK BY AND VERIFIED WITH:     Hilarie Fredrickson RN 12:15 07/05/12 ([email or web address])   No results found.  Review of Systems  Constitutional: Negative for fever.  Cardiovascular: Negative for chest pain.  Gastrointestinal: Negative for nausea and vomiting.  Musculoskeletal: Positive for back pain.  Neurological: Negative for headaches.    Blood pressure 178/78, pulse 80, temperature 98 F (36.7 C), temperature source Oral, resp. rate 18, height 5\' 9"  (1.753 m), weight 203 lb  7.8 oz (92.3 kg), SpO2 95.00%. Physical Exam  Constitutional: He is oriented to person, place, and time. He appears well-developed and well-nourished.  Cardiovascular: Normal rate, regular rhythm and normal heart sounds.   No murmur heard. Respiratory: Effort normal and breath sounds normal. He has no wheezes.  GI: Soft. Bowel sounds are normal. There is no tenderness.  Musculoskeletal: Normal range of motion.  Neurological: He is alert and oriented to person, place, and time. No cranial nerve  deficit.  Skin: Skin is warm and dry.  Psychiatric: He has a normal mood and affect. His behavior is normal. Judgment and thought content normal.     Assessment/Plan Back pain MRI reveals T6-7 discitis Scheduled for disc aspiration today Pt aware of procedure benefits and risks and agreeable to proceed Consent signed and in chart  TURPIN,PAMELA A 07/05/2012, 12:29 PM

## 2012-07-06 DIAGNOSIS — M519 Unspecified thoracic, thoracolumbar and lumbosacral intervertebral disc disorder: Secondary | ICD-10-CM

## 2012-07-06 LAB — CBC
HCT: 38.9 % — ABNORMAL LOW (ref 39.0–52.0)
MCHC: 32.4 g/dL (ref 30.0–36.0)
Platelets: 266 10*3/uL (ref 150–400)
RDW: 13.4 % (ref 11.5–15.5)
WBC: 7.1 10*3/uL (ref 4.0–10.5)

## 2012-07-06 LAB — BASIC METABOLIC PANEL
BUN: 10 mg/dL (ref 6–23)
Chloride: 105 mEq/L (ref 96–112)
GFR calc Af Amer: 90 mL/min — ABNORMAL LOW (ref >90)
GFR calc non Af Amer: 77 mL/min — ABNORMAL LOW (ref >90)
Potassium: 4 mEq/L (ref 3.5–5.1)

## 2012-07-06 MED ORDER — POLYETHYLENE GLYCOL 3350 17 G PO PACK
17.0000 g | PACK | Freq: Every day | ORAL | Status: DC
  Administered 2012-07-06 – 2012-07-08 (×3): 17 g via ORAL
  Filled 2012-07-06 (×3): qty 1

## 2012-07-06 NOTE — Progress Notes (Signed)
Patient ID: Craig Waters, male   DOB: 16-May-1930, 79 y.o.   MRN: [id number]         [facility]    Date of Admission:  07/04/2012           Day 3 vancomycin        Day 3 ceftriaxone Principal Problem:   Discitis of thoracic region Active Problems:   BPH (benign prostatic hyperplasia)   PUD (peptic ulcer disease)   GERD (gastroesophageal reflux disease)   HTN (hypertension)   . amLODipine  5 mg Oral Daily  . cefTRIAXone (ROCEPHIN)  IV  2 g Intravenous Q24H  . Chlorhexidine Gluconate Cloth  6 each Topical Q0600  . mupirocin ointment  1 application Nasal BID  . pantoprazole  40 mg Oral Q1200  . vancomycin  1,250 mg Intravenous Q12H   Objective: Temp:  [97.3 F (36.3 C)-98.8 F (37.1 C)] 98.3 F (36.8 C) (03/15 0529) Pulse Rate:  [74-106] 85 (03/15 0529) Resp:  [16-20] 20 (03/15 0529) BP: (142-188)/(70-100) 155/83 mmHg (03/15 0529) SpO2:  [92 %-100 %] 94 % (03/15 0529)  Lab Results Lab Results  Component Value Date   WBC 7.1 07/06/2012   HGB 12.6* 07/06/2012   HCT 38.9* 07/06/2012   MCV 90.3 07/06/2012   PLT 266 07/06/2012    Lab Results  Component Value Date   CREATININE 0.94 07/06/2012   BUN 10 07/06/2012   NA 139 07/06/2012   K 4.0 07/06/2012   CL 105 07/06/2012   CO2 26 07/06/2012    Lab Results  Component Value Date   ALT 10 11/08/2011   AST 12 11/08/2011   ALKPHOS 86 11/08/2011   BILITOT 0.4 11/08/2011      Microbiology: Recent Results (from the past 240 hour(s))  CULTURE, BLOOD (ROUTINE X 2)     Status: None   Collection Time    07/04/12  7:30 PM      Result Value Range Status   Specimen Description BLOOD ARM RIGHT   Final   Special Requests BOTTLES DRAWN AEROBIC AND ANAEROBIC 10CC   Final   Culture  Setup Time 07/05/2012 02:36   Final   Culture     Final   Value:        BLOOD CULTURE RECEIVED NO GROWTH TO DATE CULTURE WILL BE HELD FOR 5 DAYS BEFORE ISSUING A FINAL NEGATIVE REPORT   Report Status PENDING   Incomplete  CULTURE,  BLOOD (ROUTINE X 2)     Status: None   Collection Time    07/04/12  7:45 PM      Result Value Range Status   Specimen Description BLOOD ARM RIGHT   Final   Special Requests BOTTLES DRAWN AEROBIC AND ANAEROBIC 10CC   Final   Culture  Setup Time 07/05/2012 02:36   Final   Culture     Final   Value:        BLOOD CULTURE RECEIVED NO GROWTH TO DATE CULTURE WILL BE HELD FOR 5 DAYS BEFORE ISSUING A FINAL NEGATIVE REPORT   Report Status PENDING   Incomplete  MRSA PCR SCREENING     Status: Abnormal   Collection Time    07/05/12  9:20 AM      Result Value Range Status   MRSA by PCR POSITIVE (*) NEGATIVE Final   Comment:            The GeneXpert MRSA Assay (FDA     approved for NASAL specimens  only), is one component of a     comprehensive MRSA colonization     surveillance program. It is not     intended to diagnose MRSA     infection nor to guide or     monitor treatment for     MRSA infections.     RESULT CALLED TO, READ BACK BY AND VERIFIED WITH:     Hilarie Fredrickson RN 12:15 07/05/12 ([email or web address])  WOUND CULTURE     Status: None   Collection Time    07/05/12  5:12 PM      Result Value Range Status   Specimen Description WOUND   Final   Special Requests T6 T7 DISC SPACE ASPIRATION   Final   Gram Stain     Final   Value: RARE WBC PRESENT, PREDOMINANTLY PMN     NO SQUAMOUS EPITHELIAL CELLS SEEN     NO ORGANISMS SEEN   Culture NO GROWTH 1 DAY   Final   Report Status PENDING   Incomplete    Studies/Results: Ir Cervical/thoracic Disc Asp W/imag Grant Ruts  07/06/2012  *RADIOLOGY REPORT*  Clinical Data: T6-7 diskitis  FLUOROSCOPIC GUIDED T6-7 DISC ASPIRATION  Comparison:  Outside hospital CT and MRI demonstrated destruction of the endplates and fluid at the T6-7 disc level  Sedation: 5.0 mg IV Versed; 200 mcg IV Fentanyl.  Total Moderate Sedation Time: 30 minutes.  Fluoroscopy Time: 3.9 minutes.  Procedure:  The procedure, risks, benefits, and alternatives were explained to the patient.  Questions  regarding the procedure were encouraged and answered.  The patient understands and consents to the procedure.  The mid back over the thoracic spine was prepped with betadine in a sterile fashion, and a sterile drape was applied covering the operative field.  A sterile gown and sterile gloves were used for the procedure. Local anesthesia was provided with 1% Lidocaine.  The T6-7 disc level was identified.  A right posterolateral approach was taken.  The superficial soft tissues were not supplies with 1% lidocaine.  A 20 gauge Franseen needle was then advanced to the disc space.  A coaxial needle was curved and advanced into the disc space. I engaged a 20 ml syringe and applied section.  I then made several passes into the disc space with the needle.  2.0 ml of sanguinous material was aspirated.  I then made a pass with the Franseen needle, obtaining an additional 1.0 ml of sanguinous material.  I used a second coaxial needle to obtain an additional 1.0 ml of sanguinous material.  The samples were all sent for Gram stain and culture.  Complications: The patient tolerated the procedure without evidence for immediate complication.  He was transferred back to the hospital floor in stable condition.  IMPRESSION: Technically successful fluoroscopic guided disc aspiration at T6-7. Approximately 4 ml of sanguinous material was obtained and sent to the laboratory for Gram stain and culture.   Original Report Authenticated By: Marin Roberts, M.D.     Assessment: His wound aspirate cultures negative at 24 hours. I will continue his current empiric antibiotic therapy.  Plan: 1. Continue current empiric antibiotics pending final lumbar aspirate cultures  Cliffton Asters, MD [facility] [phone] pager   [phone] cell 07/06/2012, 2:57 PM

## 2012-07-06 NOTE — Progress Notes (Signed)
Patient ID: Craig Waters, male   DOB: 08/17/1930, 79 y.o.   MRN: [id number] In isolation. C/o ot thoracic pain . Legs are normal to strength and sensation

## 2012-07-06 NOTE — Progress Notes (Signed)
PATIENT DETAILS Name: Craig Waters Age: 79 y.o. Sex: male Date of Birth: [birth year] Admit Date: 07/04/2012 Admitting Physician Tarry Kos, MD ZOX:[id number] Joelene Millin, MD  Subjective: Still with with back pain- however stable however stable Assessment/Plan: Principal Problem:   Discitis of thoracic region -afebrile, no leukocytosis-no neuro deficits.  -s/p thoracic disk bx- culture pending - Appreciated ID eval - On empiric vancomycin and Rocephin - If blood cultures continue to be negative, will place a PICC line -prn Norco  HTN -just started Amlodipine 3/14- monitor for another 24 hours before adding or adjusting further  GERD -PPI  Disposition: Remain inpatient  DVT Prophylaxis: SCD's  Code Status: Full code   Procedures:  None  CONSULTS:  ID Neurosurgery IR  PHYSICAL EXAM: Vital signs in last 24 hours: Filed Vitals:   07/05/12 1837 07/05/12 1852 07/05/12 2100 07/06/12 0529  BP: 146/70 151/70 152/80 155/83  Pulse: 106 102 94 85  Temp:   98.8 F (37.1 C) 98.3 F (36.8 C)  TempSrc:   Oral Oral  Resp:  18 16 20   Height:      Weight:      SpO2:  94% 92% 94%    Weight change:  Body mass index is 30.04 kg/(m^2).   Gen Exam: Awake and alert with clear speech.   Neck: Supple, No JVD.   Chest: B/L Clear.   CVS: S1 S2 Regular, no murmurs.  Abdomen: soft, BS +, non tender, non distended.  Extremities: no edema, lower extremities warm to touch. Neurologic: Non Focal.   Skin: No Rash.   Wounds: N/A.    Intake/Output from previous day:  Intake/Output Summary (Last 24 hours) at 07/06/12 0929 Last data filed at 07/05/12 1845  Gross per 24 hour  Intake    250 ml  Output      0 ml  Net    250 ml     LAB RESULTS: CBC  Recent Labs Lab 07/04/12 1516 07/06/12 0545  WBC 7.9 7.1  HGB 13.5 12.6*  HCT 40.2 38.9*  PLT 273 266  MCV 88.9 90.3  MCH 29.9 29.2  MCHC 33.6 32.4  RDW 13.3 13.4  LYMPHSABS 1.3  --   MONOABS 0.5  --   EOSABS 0.3   --   BASOSABS 0.0  --     Chemistries   Recent Labs Lab 07/04/12 1516 07/06/12 0545  NA 142 139  K 3.7 4.0  CL 104 105  CO2 29 26  GLUCOSE 109* 95  BUN 15 10  CREATININE 0.93 0.94  CALCIUM 9.0 9.0    CBG: No results found for this basename: GLUCAP,  in the last 168 hours  GFR Estimated Creatinine Clearance: 71.5 ml/min (by C-G formula based on Cr of 0.94).  Coagulation profile  Recent Labs Lab 07/05/12 0810  INR 1.11    Cardiac Enzymes No results found for this basename: CK, CKMB, TROPONINI, MYOGLOBIN,  in the last 168 hours  No components found with this basename: POCBNP,  No results found for this basename: DDIMER,  in the last 72 hours No results found for this basename: HGBA1C,  in the last 72 hours No results found for this basename: CHOL, HDL, LDLCALC, TRIG, CHOLHDL, LDLDIRECT,  in the last 72 hours No results found for this basename: TSH, T4TOTAL, FREET3, T3FREE, THYROIDAB,  in the last 72 hours No results found for this basename: VITAMINB12, FOLATE, FERRITIN, TIBC, IRON, RETICCTPCT,  in the last 72 hours No results found for this basename: LIPASE, AMYLASE,  in the last 72 hours  Urine Studies No results found for this basename: UACOL, UAPR, USPG, UPH, UTP, UGL, UKET, UBIL, UHGB, UNIT, UROB, ULEU, UEPI, UWBC, URBC, UBAC, CAST, CRYS, UCOM, BILUA,  in the last 72 hours  MICROBIOLOGY: Recent Results (from the past 240 hour(s))  MRSA PCR SCREENING     Status: Abnormal   Collection Time    07/05/12  9:20 AM      Result Value Range Status   MRSA by PCR POSITIVE (*) NEGATIVE Final   Comment:            The GeneXpert MRSA Assay (FDA     approved for NASAL specimens     only), is one component of a     comprehensive MRSA colonization     surveillance program. It is not     intended to diagnose MRSA     infection nor to guide or     monitor treatment for     MRSA infections.     RESULT CALLED TO, READ BACK BY AND VERIFIED WITH:     Hilarie Fredrickson RN 12:15  07/05/12 ([email or web address])  WOUND CULTURE     Status: None   Collection Time    07/05/12  5:12 PM      Result Value Range Status   Specimen Description WOUND   Final   Special Requests T6 T7 DISC SPACE ASPIRATION   Final   Gram Stain     Final   Value: RARE WBC PRESENT, PREDOMINANTLY PMN     NO SQUAMOUS EPITHELIAL CELLS SEEN     NO ORGANISMS SEEN   Culture NO GROWTH 1 DAY   Final   Report Status PENDING   Incomplete    RADIOLOGY STUDIES/RESULTS: No results found.  MEDICATIONS: Scheduled Meds: . amLODipine  5 mg Oral Daily  . cefTRIAXone (ROCEPHIN)  IV  2 g Intravenous Q24H  . Chlorhexidine Gluconate Cloth  6 each Topical Q0600  . mupirocin ointment  1 application Nasal BID  . pantoprazole  40 mg Oral Q1200  . vancomycin  1,250 mg Intravenous Q12H   Continuous Infusions:  PRN Meds:.HYDROcodone-acetaminophen, morphine injection  Antibiotics: Anti-infectives   Start     Dose/Rate Route Frequency Ordered Stop   07/05/12 1900  cefTRIAXone (ROCEPHIN) 2 g in dextrose 5 % 50 mL IVPB     2 g 100 mL/hr over 30 Minutes Intravenous Every 24 hours 07/05/12 1747     07/05/12 1830  cefTRIAXone (ROCEPHIN) 2 g in dextrose 5 % 50 mL IVPB  Status:  Discontinued     2 g 100 mL/hr over 30 Minutes Intravenous Every 24 hours 07/05/12 1743 07/05/12 1747   07/05/12 1830  vancomycin (VANCOCIN) 1,250 mg in sodium chloride 0.9 % 250 mL IVPB     1,250 mg 166.7 mL/hr over 90 Minutes Intravenous Every 12 hours 07/05/12 1801     07/05/12 1000  vancomycin (VANCOCIN) 1,250 mg in sodium chloride 0.9 % 250 mL IVPB  Status:  Discontinued     1,250 mg 166.7 mL/hr over 90 Minutes Intravenous Every 12 hours 07/04/12 2151 07/05/12 0721   07/05/12 0830  cefTRIAXone (ROCEPHIN) 2 g in dextrose 5 % 50 mL IVPB  Status:  Discontinued     2 g 100 mL/hr over 30 Minutes Intravenous Every 24 hours 07/05/12 0751 07/05/12 0753   07/04/12 2130  vancomycin (VANCOCIN) 2,000 mg in sodium chloride 0.9 % 500 mL IVPB     2,000 mg 250  mL/hr over  120 Minutes Intravenous NOW 07/04/12 2120 07/05/12 0008   07/04/12 2100  cefTRIAXone (ROCEPHIN) 1 g in dextrose 5 % 50 mL IVPB  Status:  Discontinued     1 g 100 mL/hr over 30 Minutes Intravenous Every 24 hours 07/04/12 2059 07/05/12 0721   07/04/12 1915  vancomycin (VANCOCIN) 2,000 mg in sodium chloride 0.9 % 500 mL IVPB  Status:  Discontinued     2,000 mg 250 mL/hr over 120 Minutes Intravenous  Once 07/04/12 1905 07/04/12 1937   07/04/12 1915  cefTRIAXone (ROCEPHIN) 1 g in dextrose 5 % 50 mL IVPB  Status:  Discontinued     1 g 100 mL/hr over 30 Minutes Intravenous  Once 07/04/12 1905 07/04/12 Velvet Bathe, MD  [facility] Pager:[phone]  If 7PM-7AM, please contact night-coverage [email or web address] Password [email or web address] 07/06/2012, 9:29 AM   LOS: 2 days

## 2012-07-07 MED ORDER — SODIUM CHLORIDE 0.9 % IJ SOLN
10.0000 mL | INTRAMUSCULAR | Status: DC | PRN
  Administered 2012-07-07: 10 mL

## 2012-07-07 MED ORDER — SODIUM CHLORIDE 0.9 % IJ SOLN: 10.0000 mL | Freq: Two times a day (BID) | INTRAMUSCULAR | Status: DC

## 2012-07-07 NOTE — Progress Notes (Signed)
PATIENT DETAILS Name: Craig Waters Age: 79 y.o. Sex: male Date of Birth: [birth year] Admit Date: 07/04/2012 Admitting Physician Tarry Kos, MD ZOX:[id number] Joelene Millin, MD  Subjective: Still with with back pain-but stable  Assessment/Plan: Principal Problem:   Discitis of thoracic region -afebrile, no leukocytosis-no neuro deficits.  -s/p thoracic disk bx- culture pending - Appreciated ID eval and NS eval - On empiric vancomycin and Rocephin - Blood cultures continue to be negative, will place a PICC line -prn Norco  HTN -just started Amlodipine 3/14-BP moderate control-continue with Amlodipine-re-eval in am  GERD -PPI  Disposition: Remain inpatient  DVT Prophylaxis: SCD's  Code Status: Full code   Procedures:  None  CONSULTS:  ID Neurosurgery IR  PHYSICAL EXAM: Vital signs in last 24 hours: Filed Vitals:   07/06/12 0529 07/06/12 1527 07/06/12 2140 07/07/12 0411  BP: 155/83 158/85 154/70 155/77  Pulse: 85 90 86 69  Temp: 98.3 F (36.8 C) 98.1 F (36.7 C) 98.4 F (36.9 C) 98 F (36.7 C)  TempSrc: Oral Oral Oral Oral  Resp: 20 18 20 18   Height:      Weight:      SpO2: 94% 95% 94% 93%    Weight change:  Body mass index is 30.04 kg/(m^2).   Gen Exam: Awake and alert with clear speech.   Neck: Supple, No JVD.   Chest: B/L Clear.  No rales CVS: S1 S2 Regular, no murmurs.  Abdomen: soft, BS +, non tender, non distended.  Extremities: no edema, lower extremities warm to touch. Neurologic: Non Focal.   Skin: No Rash.   Wounds: N/A.    Intake/Output from previous day: No intake or output data in the 24 hours ending 07/07/12 1007   LAB RESULTS: CBC  Recent Labs Lab 07/04/12 1516 07/06/12 0545  WBC 7.9 7.1  HGB 13.5 12.6*  HCT 40.2 38.9*  PLT 273 266  MCV 88.9 90.3  MCH 29.9 29.2  MCHC 33.6 32.4  RDW 13.3 13.4  LYMPHSABS 1.3  --   MONOABS 0.5  --   EOSABS 0.3  --   BASOSABS 0.0  --     Chemistries   Recent Labs Lab  07/04/12 1516 07/06/12 0545  NA 142 139  K 3.7 4.0  CL 104 105  CO2 29 26  GLUCOSE 109* 95  BUN 15 10  CREATININE 0.93 0.94  CALCIUM 9.0 9.0    CBG: No results found for this basename: GLUCAP,  in the last 168 hours  GFR Estimated Creatinine Clearance: 71.5 ml/min (by C-G formula based on Cr of 0.94).  Coagulation profile  Recent Labs Lab 07/05/12 0810  INR 1.11    Cardiac Enzymes No results found for this basename: CK, CKMB, TROPONINI, MYOGLOBIN,  in the last 168 hours  No components found with this basename: POCBNP,  No results found for this basename: DDIMER,  in the last 72 hours No results found for this basename: HGBA1C,  in the last 72 hours No results found for this basename: CHOL, HDL, LDLCALC, TRIG, CHOLHDL, LDLDIRECT,  in the last 72 hours No results found for this basename: TSH, T4TOTAL, FREET3, T3FREE, THYROIDAB,  in the last 72 hours No results found for this basename: VITAMINB12, FOLATE, FERRITIN, TIBC, IRON, RETICCTPCT,  in the last 72 hours No results found for this basename: LIPASE, AMYLASE,  in the last 72 hours  Urine Studies No results found for this basename: UACOL, UAPR, USPG, UPH, UTP, UGL, UKET, UBIL, UHGB, UNIT, UROB, ULEU, UEPI, UWBC, URBC,  UBAC, CAST, CRYS, UCOM, BILUA,  in the last 72 hours  MICROBIOLOGY: Recent Results (from the past 240 hour(s))  CULTURE, BLOOD (ROUTINE X 2)     Status: None   Collection Time    07/04/12  7:30 PM      Result Value Range Status   Specimen Description BLOOD ARM RIGHT   Final   Special Requests BOTTLES DRAWN AEROBIC AND ANAEROBIC 10CC   Final   Culture  Setup Time 07/05/2012 02:36   Final   Culture     Final   Value:        BLOOD CULTURE RECEIVED NO GROWTH TO DATE CULTURE WILL BE HELD FOR 5 DAYS BEFORE ISSUING A FINAL NEGATIVE REPORT   Report Status PENDING   Incomplete  CULTURE, BLOOD (ROUTINE X 2)     Status: None   Collection Time    07/04/12  7:45 PM      Result Value Range Status   Specimen  Description BLOOD ARM RIGHT   Final   Special Requests BOTTLES DRAWN AEROBIC AND ANAEROBIC 10CC   Final   Culture  Setup Time 07/05/2012 02:36   Final   Culture     Final   Value:        BLOOD CULTURE RECEIVED NO GROWTH TO DATE CULTURE WILL BE HELD FOR 5 DAYS BEFORE ISSUING A FINAL NEGATIVE REPORT   Report Status PENDING   Incomplete  MRSA PCR SCREENING     Status: Abnormal   Collection Time    07/05/12  9:20 AM      Result Value Range Status   MRSA by PCR POSITIVE (*) NEGATIVE Final   Comment:            The GeneXpert MRSA Assay (FDA     approved for NASAL specimens     only), is one component of a     comprehensive MRSA colonization     surveillance program. It is not     intended to diagnose MRSA     infection nor to guide or     monitor treatment for     MRSA infections.     RESULT CALLED TO, READ BACK BY AND VERIFIED WITH:     Hilarie Fredrickson RN 12:15 07/05/12 ([email or web address])  AFB CULTURE WITH SMEAR     Status: None   Collection Time    07/05/12  5:12 PM      Result Value Range Status   Specimen Description WOUND   Final   Special Requests T6 T7 DISC SPACE ASPIRATION   Final   ACID FAST SMEAR NO ACID FAST BACILLI SEEN   Final   Culture     Final   Value: CULTURE WILL BE EXAMINED FOR 6 WEEKS BEFORE ISSUING A FINAL REPORT   Report Status PENDING   Incomplete  FUNGUS CULTURE W SMEAR     Status: None   Collection Time    07/05/12  5:12 PM      Result Value Range Status   Specimen Description WOUND   Final   Special Requests T6 T7 DISC SPACE ASPIRATION   Final   Fungal Smear NO YEAST OR FUNGAL ELEMENTS SEEN   Final   Culture CULTURE IN PROGRESS FOR FOUR WEEKS   Final   Report Status PENDING   Incomplete  WOUND CULTURE     Status: None   Collection Time    07/05/12  5:12 PM      Result Value Range Status  Specimen Description WOUND   Final   Special Requests T6 T7 DISC SPACE ASPIRATION   Final   Gram Stain     Final   Value: RARE WBC PRESENT, PREDOMINANTLY PMN     NO SQUAMOUS  EPITHELIAL CELLS SEEN     NO ORGANISMS SEEN   Culture NO GROWTH 2 DAYS   Final   Report Status PENDING   Incomplete    RADIOLOGY STUDIES/RESULTS: No results found.  MEDICATIONS: Scheduled Meds: . amLODipine  5 mg Oral Daily  . cefTRIAXone (ROCEPHIN)  IV  2 g Intravenous Q24H  . Chlorhexidine Gluconate Cloth  6 each Topical Q0600  . mupirocin ointment  1 application Nasal BID  . pantoprazole  40 mg Oral Q1200  . polyethylene glycol  17 g Oral Daily  . vancomycin  1,250 mg Intravenous Q12H   Continuous Infusions:  PRN Meds:.HYDROcodone-acetaminophen, morphine injection  Antibiotics: Anti-infectives   Start     Dose/Rate Route Frequency Ordered Stop   07/05/12 1900  cefTRIAXone (ROCEPHIN) 2 g in dextrose 5 % 50 mL IVPB     2 g 100 mL/hr over 30 Minutes Intravenous Every 24 hours 07/05/12 1747     07/05/12 1830  cefTRIAXone (ROCEPHIN) 2 g in dextrose 5 % 50 mL IVPB  Status:  Discontinued     2 g 100 mL/hr over 30 Minutes Intravenous Every 24 hours 07/05/12 1743 07/05/12 1747   07/05/12 1830  vancomycin (VANCOCIN) 1,250 mg in sodium chloride 0.9 % 250 mL IVPB     1,250 mg 166.7 mL/hr over 90 Minutes Intravenous Every 12 hours 07/05/12 1801     07/05/12 1000  vancomycin (VANCOCIN) 1,250 mg in sodium chloride 0.9 % 250 mL IVPB  Status:  Discontinued     1,250 mg 166.7 mL/hr over 90 Minutes Intravenous Every 12 hours 07/04/12 2151 07/05/12 0721   07/05/12 0830  cefTRIAXone (ROCEPHIN) 2 g in dextrose 5 % 50 mL IVPB  Status:  Discontinued     2 g 100 mL/hr over 30 Minutes Intravenous Every 24 hours 07/05/12 0751 07/05/12 0753   07/04/12 2130  vancomycin (VANCOCIN) 2,000 mg in sodium chloride 0.9 % 500 mL IVPB     2,000 mg 250 mL/hr over 120 Minutes Intravenous NOW 07/04/12 2120 07/05/12 0008   07/04/12 2100  cefTRIAXone (ROCEPHIN) 1 g in dextrose 5 % 50 mL IVPB  Status:  Discontinued     1 g 100 mL/hr over 30 Minutes Intravenous Every 24 hours 07/04/12 2059 07/05/12 0721    07/04/12 1915  vancomycin (VANCOCIN) 2,000 mg in sodium chloride 0.9 % 500 mL IVPB  Status:  Discontinued     2,000 mg 250 mL/hr over 120 Minutes Intravenous  Once 07/04/12 1905 07/04/12 1937   07/04/12 1915  cefTRIAXone (ROCEPHIN) 1 g in dextrose 5 % 50 mL IVPB  Status:  Discontinued     1 g 100 mL/hr over 30 Minutes Intravenous  Once 07/04/12 1905 07/04/12 Velvet Bathe, MD  [facility] Pager:[phone]  If 7PM-7AM, please contact night-coverage [email or web address] Password [email or web address] 07/07/2012, 10:07 AM   LOS: 3 days

## 2012-07-07 NOTE — Progress Notes (Signed)
Patient ID: Craig Waters, male   DOB: Jan 09, 1931, 79 y.o.   MRN: [id number]         [facility]    Date of Admission:  07/04/2012           Day 4 vancomycin        Day 4 ceftriaxone  Principal Problem:   Discitis of thoracic region Active Problems:   BPH (benign prostatic hyperplasia)   PUD (peptic ulcer disease)   GERD (gastroesophageal reflux disease)   HTN (hypertension)  Objective: Temp:  [98 F (36.7 C)-98.4 F (36.9 C)] 98 F (36.7 C) (03/16 0411) Pulse Rate:  [69-90] 69 (03/16 0411) Resp:  [18-20] 18 (03/16 0411) BP: (154-158)/(70-85) 155/77 mmHg (03/16 0411) SpO2:  [93 %-95 %] 93 % (03/16 0411)  Microbiology: Recent Results (from the past 240 hour(s))  CULTURE, BLOOD (ROUTINE X 2)     Status: None   Collection Time    07/04/12  7:30 PM      Result Value Range Status   Specimen Description BLOOD ARM RIGHT   Final   Special Requests BOTTLES DRAWN AEROBIC AND ANAEROBIC 10CC   Final   Culture  Setup Time 07/05/2012 02:36   Final   Culture     Final   Value:        BLOOD CULTURE RECEIVED NO GROWTH TO DATE CULTURE WILL BE HELD FOR 5 DAYS BEFORE ISSUING A FINAL NEGATIVE REPORT   Report Status PENDING   Incomplete  CULTURE, BLOOD (ROUTINE X 2)     Status: None   Collection Time    07/04/12  7:45 PM      Result Value Range Status   Specimen Description BLOOD ARM RIGHT   Final   Special Requests BOTTLES DRAWN AEROBIC AND ANAEROBIC 10CC   Final   Culture  Setup Time 07/05/2012 02:36   Final   Culture     Final   Value:        BLOOD CULTURE RECEIVED NO GROWTH TO DATE CULTURE WILL BE HELD FOR 5 DAYS BEFORE ISSUING A FINAL NEGATIVE REPORT   Report Status PENDING   Incomplete  MRSA PCR SCREENING     Status: Abnormal   Collection Time    07/05/12  9:20 AM      Result Value Range Status   MRSA by PCR POSITIVE (*) NEGATIVE Final   Comment:            The GeneXpert MRSA Assay (FDA     approved for NASAL specimens     only), is one component  of a     comprehensive MRSA colonization     surveillance program. It is not     intended to diagnose MRSA     infection nor to guide or     monitor treatment for     MRSA infections.     RESULT CALLED TO, READ BACK BY AND VERIFIED WITH:     Hilarie Fredrickson RN 12:15 07/05/12 ([email or web address])  AFB CULTURE WITH SMEAR     Status: None   Collection Time    07/05/12  5:12 PM      Result Value Range Status   Specimen Description WOUND   Final   Special Requests T6 T7 DISC SPACE ASPIRATION   Final   ACID FAST SMEAR NO ACID FAST BACILLI SEEN   Final   Culture     Final   Value: CULTURE WILL BE EXAMINED FOR 6 WEEKS BEFORE  ISSUING A FINAL REPORT   Report Status PENDING   Incomplete  FUNGUS CULTURE W SMEAR     Status: None   Collection Time    07/05/12  5:12 PM      Result Value Range Status   Specimen Description WOUND   Final   Special Requests T6 T7 DISC SPACE ASPIRATION   Final   Fungal Smear NO YEAST OR FUNGAL ELEMENTS SEEN   Final   Culture CULTURE IN PROGRESS FOR FOUR WEEKS   Final   Report Status PENDING   Incomplete  WOUND CULTURE     Status: None   Collection Time    07/05/12  5:12 PM      Result Value Range Status   Specimen Description WOUND   Final   Special Requests T6 T7 DISC SPACE ASPIRATION   Final   Gram Stain     Final   Value: RARE WBC PRESENT, PREDOMINANTLY PMN     NO SQUAMOUS EPITHELIAL CELLS SEEN     NO ORGANISMS SEEN   Culture NO GROWTH 2 DAYS   Final   Report Status PENDING   Incomplete    Assessment: His T 6-7 aspirate cultures remain negative but were obtained after starting empiric antibiotic therapy.  Plan: 1. Continue current antibiotics 2. Agree with PICC placement  Cliffton Asters, MD [facility] [phone] pager   [phone] cell 07/07/2012, 11:31 AM

## 2012-07-07 NOTE — Progress Notes (Signed)
ANTIBIOTIC CONSULT NOTE - INITIAL  Pharmacy Consult for vancomycin Indication: Thoracic discitis/osteomyelitis   Allergies  Allergen Reactions  . Other Other (See Comments)    Pain med-causes hallucinations (unknown drug name)    Patient Measurements: Wt= 92kg  Vital Signs: Temp: 98 F (36.7 C) (03/16 0411) Temp src: Oral (03/16 0411) BP: 155/77 mmHg (03/16 0411) Pulse Rate: 69 (03/16 0411) Intake/Output from previous day: 03/15 0701 - 03/16 0700 In: 240 [P.O.:240] Out: -  Intake/Output from this shift:    Labs:  Recent Labs  07/04/12 1516 07/06/12 0545  WBC 7.9 7.1  HGB 13.5 12.6*  PLT 273 266  CREATININE 0.93 0.94   Estimated Creatinine Clearance: 71.5 ml/min (by C-G formula based on Cr of 0.94). No results found for this basename: VANCOTROUGH, Leodis Binet, VANCORANDOM, GENTTROUGH, GENTPEAK, GENTRANDOM, TOBRATROUGH, TOBRAPEAK, TOBRARND, AMIKACINPEAK, AMIKACINTROU, AMIKACIN,  in the last 72 hours   Microbiology: Recent Results (from the past 720 hour(s))  CULTURE, BLOOD (ROUTINE X 2)     Status: None   Collection Time    07/04/12  7:30 PM      Result Value Range Status   Specimen Description BLOOD ARM RIGHT   Final   Special Requests BOTTLES DRAWN AEROBIC AND ANAEROBIC 10CC   Final   Culture  Setup Time 07/05/2012 02:36   Final   Culture     Final   Value:        BLOOD CULTURE RECEIVED NO GROWTH TO DATE CULTURE WILL BE HELD FOR 5 DAYS BEFORE ISSUING A FINAL NEGATIVE REPORT   Report Status PENDING   Incomplete  CULTURE, BLOOD (ROUTINE X 2)     Status: None   Collection Time    07/04/12  7:45 PM      Result Value Range Status   Specimen Description BLOOD ARM RIGHT   Final   Special Requests BOTTLES DRAWN AEROBIC AND ANAEROBIC 10CC   Final   Culture  Setup Time 07/05/2012 02:36   Final   Culture     Final   Value:        BLOOD CULTURE RECEIVED NO GROWTH TO DATE CULTURE WILL BE HELD FOR 5 DAYS BEFORE ISSUING A FINAL NEGATIVE REPORT   Report Status PENDING    Incomplete  MRSA PCR SCREENING     Status: Abnormal   Collection Time    07/05/12  9:20 AM      Result Value Range Status   MRSA by PCR POSITIVE (*) NEGATIVE Final   Comment:            The GeneXpert MRSA Assay (FDA     approved for NASAL specimens     only), is one component of a     comprehensive MRSA colonization     surveillance program. It is not     intended to diagnose MRSA     infection nor to guide or     monitor treatment for     MRSA infections.     RESULT CALLED TO, READ BACK BY AND VERIFIED WITH:     Hilarie Fredrickson RN 12:15 07/05/12 ([email or web address])  AFB CULTURE WITH SMEAR     Status: None   Collection Time    07/05/12  5:12 PM      Result Value Range Status   Specimen Description WOUND   Final   Special Requests T6 T7 DISC SPACE ASPIRATION   Final   ACID FAST SMEAR NO ACID FAST BACILLI SEEN   Final   Culture  Final   Value: CULTURE WILL BE EXAMINED FOR 6 WEEKS BEFORE ISSUING A FINAL REPORT   Report Status PENDING   Incomplete  FUNGUS CULTURE W SMEAR     Status: None   Collection Time    07/05/12  5:12 PM      Result Value Range Status   Specimen Description WOUND   Final   Special Requests T6 T7 DISC SPACE ASPIRATION   Final   Fungal Smear NO YEAST OR FUNGAL ELEMENTS SEEN   Final   Culture CULTURE IN PROGRESS FOR FOUR WEEKS   Final   Report Status PENDING   Incomplete  WOUND CULTURE     Status: None   Collection Time    07/05/12  5:12 PM      Result Value Range Status   Specimen Description WOUND   Final   Special Requests T6 T7 DISC SPACE ASPIRATION   Final   Gram Stain     Final   Value: RARE WBC PRESENT, PREDOMINANTLY PMN     NO SQUAMOUS EPITHELIAL CELLS SEEN     NO ORGANISMS SEEN   Culture NO GROWTH 2 DAYS   Final   Report Status PENDING   Incomplete    Assessment: 79 yo male with osteomyelitis/discitis to start on vancomycin (also on rocephin).  SCr= 0.94 with CrCl 55-65 ml/min.  He remains on vancomycin 1250mg  IV q12.  Evening dose from 3/15 not charted as  given. He remains afebrile and WBC is 7.1.  Cultures are negative to date.  Goal of Therapy:  Vancomycin trough level 15-20 mcg/ml  Plan:  -Continue vancomycin 1250mg  IV q12 -Will follow renal function, cultures and vancomycin levels as needed, probably on 3/18  Celedonio Miyamoto, PharmD, BCPS Clinical Pharmacist Pager [phone]   07/07/2012 10:20 AM

## 2012-07-07 NOTE — Progress Notes (Signed)
Patient ID: Craig Waters, male   DOB: Sep 07, 1930, 79 y.o.   MRN: [id number] No new neuro issues. On antibiotics per ID service.  Continue present rx.

## 2012-07-07 NOTE — Progress Notes (Signed)
Peripherally Inserted Central Catheter/Midline Placement.  MD states to place PICC now that Blood Cultures are negative.  The IV Nurse has discussed with the patient and/or persons authorized to consent for the patient, the purpose of this procedure and the potential benefits and risks involved with this procedure.  The benefits include less needle sticks, lab draws from the catheter and patient may be discharged home with the catheter.  Risks include, but not limited to, infection, bleeding, blood clot (thrombus formation), and puncture of an artery; nerve damage and irregular heat beat.  Alternatives to this procedure were also discussed.  PICC/Midline Placement Documentation  PICC / Midline Single Lumen 07/07/12 PICC Right Basilic (Active)       Craig Waters 07/07/2012, 12:23 PM

## 2012-07-08 ENCOUNTER — Other Ambulatory Visit (HOSPITAL_COMMUNITY): Payer: Self-pay | Admitting: Internal Medicine

## 2012-07-08 LAB — BASIC METABOLIC PANEL
CO2: 28 mEq/L (ref 19–32)
Chloride: 105 mEq/L (ref 96–112)
GFR calc Af Amer: >90 mL/min (ref >90)
Potassium: 3.7 mEq/L (ref 3.5–5.1)
Sodium: 140 mEq/L (ref 135–145)

## 2012-07-08 LAB — WOUND CULTURE

## 2012-07-08 MED ORDER — POLYETHYLENE GLYCOL 3350 17 G PO PACK: 17.0000 g | PACK | Freq: Every day | ORAL | Status: DC

## 2012-07-08 MED ORDER — SENNA 8.6 MG PO TABS
2.0000 | ORAL_TABLET | Freq: Once | ORAL | Status: AC
  Administered 2012-07-08: 17.2 mg via ORAL
  Filled 2012-07-08: qty 2

## 2012-07-08 MED ORDER — HYDROCODONE-ACETAMINOPHEN 5-325 MG PO TABS: 1.0000 | ORAL_TABLET | Freq: Four times a day (QID) | ORAL | Status: DC | PRN

## 2012-07-08 MED ORDER — VANCOMYCIN HCL 10 G IV SOLR: INTRAVENOUS | Status: DC

## 2012-07-08 MED ORDER — MAGNESIUM HYDROXIDE 400 MG/5ML PO SUSP
15.0000 mL | Freq: Every day | ORAL | Status: DC | PRN
  Administered 2012-07-08: 15 mL via ORAL
  Filled 2012-07-08: qty 30

## 2012-07-08 MED ORDER — HEPARIN SOD (PORK) LOCK FLUSH 100 UNIT/ML IV SOLN
250.0000 [IU] | Freq: Every day | INTRAVENOUS | Status: DC
  Filled 2012-07-08: qty 3

## 2012-07-08 MED ORDER — AMLODIPINE BESYLATE 5 MG PO TABS: 5.0000 mg | ORAL_TABLET | Freq: Every day | ORAL | Status: DC

## 2012-07-08 MED ORDER — HEPARIN SOD (PORK) LOCK FLUSH 100 UNIT/ML IV SOLN
250.0000 [IU] | INTRAVENOUS | Status: DC | PRN
  Administered 2012-07-08: 250 [IU]

## 2012-07-08 MED ORDER — DEXTROSE 5 % IV SOLN: INTRAVENOUS | Status: DC

## 2012-07-08 NOTE — Progress Notes (Signed)
NURSING PROGRESS NOTE  DORAL VENTRELLA [id number] Discharge Data: 07/08/2012 1:20 PM Attending Provider: Maretta Bees, MD [id number] Joelene Millin, MD     Elpidio Eric to be D/C'd Home per MD order.  Discussed with the patient the After Visit Summary and all questions fully answered. Pt to be discharged with PICC line for home treatments, IV team cared for PICC for d/c. All belongings returned to patient for patient to take home.   Last Vital Signs:  Blood pressure 145/70, pulse 90, temperature 97.8 F (36.6 C), temperature source Oral, resp. rate 18, height 5\' 9"  (1.753 m), weight 92.3 kg (203 lb 7.8 oz), SpO2 96.00%.  Discharge Medication List   Medication List    STOP taking these medications       OVER THE COUNTER MEDICATION     traMADol 50 MG tablet  Commonly known as:  ULTRAM      TAKE these medications       amLODipine 5 MG tablet  Commonly known as:  NORVASC  Take 1 tablet (5 mg total) by mouth daily.     dextrose 5 % SOLN 50 mL with cefTRIAXone 2 G SOLR  2 g, Intravenous, for 30 Minutes, Every 24 hours, For 6 weeks from  07/05/12     HYDROcodone-acetaminophen 5-325 MG per tablet  Commonly known as:  NORCO/VICODIN  Take 1-2 tablets by mouth every 6 (six) hours as needed.     ibuprofen 200 MG tablet  Commonly known as:  ADVIL,MOTRIN  Take 600 mg by mouth daily as needed. For pain     polyethylene glycol packet  Commonly known as:  MIRALAX / GLYCOLAX  Take 17 g by mouth daily.     sodium chloride 0.9 % SOLN 250 mL with vancomycin 10 G SOLR  1,250 mg, Intravenous, for 90 Minutes, Every 12 hours, for 6 weeks from 07/05/12

## 2012-07-08 NOTE — Care Management Note (Signed)
    Page 1 of 1   07/08/2012     10:52:10 AM   CARE MANAGEMENT NOTE 07/08/2012  Patient:  Craig Waters, Craig Waters   Account Number:  [id number]  Date Initiated:  07/08/2012  Documentation initiated by:  Letha Cape  Subjective/Objective Assessment:   dx discitis of thoracic region  admit- lives with spouse and daughter. pta indep.     Action/Plan:   HHRN for IV ABX- has picc line for iv ABX at home.   Anticipated DC Date:  07/08/2012   Anticipated DC Plan:  HOME W HOME HEALTH SERVICES      DC Planning Services  CM consult      [facility] Choice  HOME HEALTH   Choice offered to / List presented to:  C-1 Patient        HH arranged  HH-1 RN      [facility] agency  [facility]   Status of service:  Completed, signed off Medicare Important Message given?   (If response is "NO", the following Medicare IM given date fields will be blank) Date Medicare IM given:   Date Additional Medicare IM given:    Discharge Disposition:  HOME W HOME HEALTH SERVICES  Per UR Regulation:  Reviewed for med. necessity/level of care/duration of stay  If discussed at Long Length of Stay Meetings, dates discussed:    Comments:  07/08/12 10:49 Letha Cape RN, BSN [phone] patient lives with spouse and daughter, pta pt was indep. Patient for dc today home with IV ABX, patient chose [facility] from agency list, referral made to [facility], Marie notified. Soc will begin 24-48 hrs post discharge.

## 2012-07-08 NOTE — Discharge Summary (Signed)
PATIENT DETAILS Name: Craig Waters Age: 79 y.o. Sex: male Date of Birth: 10-21-1930 MRN: [id number]. Admit Date: 07/04/2012 Admitting Physician: Tarry Kos, MD WUJ:[id number],NWGNFA Joelene Millin, MD  Recommendations for Outpatient Follow-up:  1. Check a week he vancomycin trough level, CBC, BMET and fax those results to Dr. Ephriam Knuckles attention- at the infectious disease clinic 2. We need to repeat imaging of the thoracic spine by MRI at the discretion of infectious disease on neurosurgery 3. Optimize blood pressure control  PRIMARY DISCHARGE DIAGNOSIS:  Principal Problem:   Discitis of thoracic region- T6-T7 level Active Problems:   BPH (benign prostatic hyperplasia)   PUD (peptic ulcer disease)   GERD (gastroesophageal reflux disease)   HTN (hypertension)      PAST MEDICAL HISTORY: Past Medical History  Diagnosis Date  . Blood in stool   . Diverticulitis   . GERD (gastroesophageal reflux disease)   . Glaucoma   . Ulcer   . History of transfusion of whole blood   . Prostate disorder   . Shortness of breath 10-18-11    with exertion, cardiac cath done 7-8 yrs ago negative.  . Vertigo 10-18-11    inner ear issues occ. -tx. Bonine as needed  . Arthritis 10-18-11    back, fingers  . H/O hiatal hernia 10-18-11    noted on recent CXR  . Adenomatous colon polyp     DISCHARGE MEDICATIONS:   Medication List    STOP taking these medications       OVER THE COUNTER MEDICATION     traMADol 50 MG tablet  Commonly known as:  ULTRAM      TAKE these medications       amLODipine 5 MG tablet  Commonly known as:  NORVASC  Take 1 tablet (5 mg total) by mouth daily.     dextrose 5 % SOLN 50 mL with cefTRIAXone 2 G SOLR  2 g, Intravenous, for 30 Minutes, Every 24 hours, For 6 weeks from  07/05/12     HYDROcodone-acetaminophen 5-325 MG per tablet  Commonly known as:  NORCO/VICODIN  Take 1-2 tablets by mouth every 6 (six) hours as needed.     ibuprofen 200 MG tablet  Commonly known  as:  ADVIL,MOTRIN  Take 600 mg by mouth daily as needed. For pain     polyethylene glycol packet  Commonly known as:  MIRALAX / GLYCOLAX  Take 17 g by mouth daily.     sodium chloride 0.9 % SOLN 250 mL with vancomycin 10 G SOLR  1,250 mg, Intravenous, for 90 Minutes, Every 12 hours, for 6 weeks from 07/05/12         BRIEF HPI:  See H&P, Labs, Consult and Test reports for all details in brief, patient is a 79 year old male who had a long-standing history of low to mid back pain, he had a MRI of his thoracic spine done as an outpatient and was found to have discitis and referred to the hospitalist service for admission.  CONSULTATIONS:   ID and Neurosurgery  PERTINENT RADIOLOGIC STUDIES: Ir Cervical/thoracic Disc Asp W/imag [facility]  07/06/2012  *RADIOLOGY REPORT*  Clinical Data: T6-7 diskitis  FLUOROSCOPIC GUIDED T6-7 DISC ASPIRATION  Comparison:  Outside hospital CT and MRI demonstrated destruction of the endplates and fluid at the T6-7 disc level  Sedation: 5.0 mg IV Versed; 200 mcg IV Fentanyl.  Total Moderate Sedation Time: 30 minutes.  Fluoroscopy Time: 3.9 minutes.  Procedure:  The procedure, risks, benefits, and alternatives were explained to the patient.  Questions regarding the procedure were encouraged and answered.  The patient understands and consents to the procedure.  The mid back over the thoracic spine was prepped with betadine in a sterile fashion, and a sterile drape was applied covering the operative field.  A sterile gown and sterile gloves were used for the procedure. Local anesthesia was provided with 1% Lidocaine.  The T6-7 disc level was identified.  A right posterolateral approach was taken.  The superficial soft tissues were not supplies with 1% lidocaine.  A 20 gauge Franseen needle was then advanced to the disc space.  A coaxial needle was curved and advanced into the disc space. I engaged a 20 ml syringe and applied section.  I then made several passes into the disc space  with the needle.  2.0 ml of sanguinous material was aspirated.  I then made a pass with the Franseen needle, obtaining an additional 1.0 ml of sanguinous material.  I used a second coaxial needle to obtain an additional 1.0 ml of sanguinous material.  The samples were all sent for Gram stain and culture.  Complications: The patient tolerated the procedure without evidence for immediate complication.  He was transferred back to the hospital floor in stable condition.  IMPRESSION: Technically successful fluoroscopic guided disc aspiration at T6-7. Approximately 4 ml of sanguinous material was obtained and sent to the laboratory for Gram stain and culture.   Original Report Authenticated By: Marin Roberts, M.D.      PERTINENT LAB RESULTS: CBC:  Recent Labs  07/06/12 0545  WBC 7.1  HGB 12.6*  HCT 38.9*  PLT 266   CMET CMP     Component Value Date/Time   NA 140 07/08/2012 0500   K 3.7 07/08/2012 0500   CL 105 07/08/2012 0500   CO2 28 07/08/2012 0500   GLUCOSE 95 07/08/2012 0500   BUN 9 07/08/2012 0500   CREATININE 0.73 07/08/2012 0500   CALCIUM 8.8 07/08/2012 0500   PROT 6.4 11/08/2011 1930   ALBUMIN 2.9* 11/08/2011 1930   AST 12 11/08/2011 1930   ALT 10 11/08/2011 1930   ALKPHOS 86 11/08/2011 1930   BILITOT 0.4 11/08/2011 1930   GFRNONAA 86* 07/08/2012 0500   GFRAA >90 07/08/2012 0500    GFR Estimated Creatinine Clearance: 84 ml/min (by C-G formula based on Cr of 0.73). No results found for this basename: LIPASE, AMYLASE,  in the last 72 hours No results found for this basename: CKTOTAL, CKMB, CKMBINDEX, TROPONINI,  in the last 72 hours No components found with this basename: POCBNP,  No results found for this basename: DDIMER,  in the last 72 hours No results found for this basename: HGBA1C,  in the last 72 hours No results found for this basename: CHOL, HDL, LDLCALC, TRIG, CHOLHDL, LDLDIRECT,  in the last 72 hours No results found for this basename: TSH, T4TOTAL, FREET3, T3FREE,  THYROIDAB,  in the last 72 hours No results found for this basename: VITAMINB12, FOLATE, FERRITIN, TIBC, IRON, RETICCTPCT,  in the last 72 hours Coags: No results found for this basename: PT, INR,  in the last 72 hours Microbiology: Recent Results (from the past 240 hour(s))  CULTURE, BLOOD (ROUTINE X 2)     Status: None   Collection Time    07/04/12  7:30 PM      Result Value Range Status   Specimen Description BLOOD ARM RIGHT   Final   Special Requests BOTTLES DRAWN AEROBIC AND ANAEROBIC 10CC   Final   Culture  Setup Time 07/05/2012 02:36   Final   Culture     Final   Value:        BLOOD CULTURE RECEIVED NO GROWTH TO DATE CULTURE WILL BE HELD FOR 5 DAYS BEFORE ISSUING A FINAL NEGATIVE REPORT   Report Status PENDING   Incomplete  CULTURE, BLOOD (ROUTINE X 2)     Status: None   Collection Time    07/04/12  7:45 PM      Result Value Range Status   Specimen Description BLOOD ARM RIGHT   Final   Special Requests BOTTLES DRAWN AEROBIC AND ANAEROBIC 10CC   Final   Culture  Setup Time 07/05/2012 02:36   Final   Culture     Final   Value:        BLOOD CULTURE RECEIVED NO GROWTH TO DATE CULTURE WILL BE HELD FOR 5 DAYS BEFORE ISSUING A FINAL NEGATIVE REPORT   Report Status PENDING   Incomplete  MRSA PCR SCREENING     Status: Abnormal   Collection Time    07/05/12  9:20 AM      Result Value Range Status   MRSA by PCR POSITIVE (*) NEGATIVE Final   Comment:            The GeneXpert MRSA Assay (FDA     approved for NASAL specimens     only), is one component of a     comprehensive MRSA colonization     surveillance program. It is not     intended to diagnose MRSA     infection nor to guide or     monitor treatment for     MRSA infections.     RESULT CALLED TO, READ BACK BY AND VERIFIED WITH:     Hilarie Fredrickson RN 12:15 07/05/12 ([email or web address])  AFB CULTURE WITH SMEAR     Status: None   Collection Time    07/05/12  5:12 PM      Result Value Range Status   Specimen Description WOUND   Final    Special Requests T6 T7 DISC SPACE ASPIRATION   Final   ACID FAST SMEAR NO ACID FAST BACILLI SEEN   Final   Culture     Final   Value: CULTURE WILL BE EXAMINED FOR 6 WEEKS BEFORE ISSUING A FINAL REPORT   Report Status PENDING   Incomplete  FUNGUS CULTURE W SMEAR     Status: None   Collection Time    07/05/12  5:12 PM      Result Value Range Status   Specimen Description WOUND   Final   Special Requests T6 T7 DISC SPACE ASPIRATION   Final   Fungal Smear NO YEAST OR FUNGAL ELEMENTS SEEN   Final   Culture CULTURE IN PROGRESS FOR FOUR WEEKS   Final   Report Status PENDING   Incomplete  WOUND CULTURE     Status: None   Collection Time    07/05/12  5:12 PM      Result Value Range Status   Specimen Description WOUND   Final   Special Requests T6 T7 DISC SPACE ASPIRATION   Final   Gram Stain     Final   Value: RARE WBC PRESENT, PREDOMINANTLY PMN     NO SQUAMOUS EPITHELIAL CELLS SEEN     NO ORGANISMS SEEN   Culture NO GROWTH 2 DAYS   Final   Report Status 07/08/2012 FINAL   Final     BRIEF HOSPITAL COURSE:  Principal Problem:   Discitis of thoracic region - Patient had 1 month history of back pain, he had a outpatient MRI done at [facility]-which showed discitis at T6-T7 level. Our radiologist and reviewed the MRI images and confirmed these findings. Patient was admitted, unfortunately on admission patient received one dose of vancomycin intravenously, subsequently all antibiotics were stopped, interventional radiology was consulted and patient underwent a disc aspirate, blood cultures were also obtained. All blood cultures and disc aspirate cultures are negative at the time of discharge. Patient is doing well, he has no worrisome neurological findings, neurosurgery has also been following the patient along with [place]. Since blood cultures were negative, patient underwent PIC line placement on 3/16, we have spoken with infectious disease M.D.-Dr.  Luciana Axe who recommends that the patient stay on  vancomycin and Rocephin for a total of 6 weeks. Home health services will be arranged for outpatient antibiotics, patient will need weekly vancomycin trough levels along with CBC and BMET to be drawn and faxed over to the infectious disease clinic. HIS first vancomycin trough levels will need to be done on 3/18.  - Infectious disease will arrange followup at the ID clinic, patient has been asked to followup with Dr. Lovell Sheehan - neurosurgery-in the next 2-3 weeks.  - She has been instructed to seek immediate medical attention if he were to develop further worsening of his back pain or fever or extremity weakness. He has claimed understanding.   Active Problems: HTN  -c/w Amlodipine-BP controlled-assess in outpatient setting whether Amlodipine needs to increased to 10 MG or another agent needs to be added.  TODAY-DAY OF DISCHARGE:  Subjective:   Amaury Kuzel today has no headache,no chest abdominal pain,no new weakness tingling or numbness, feels much better wants to go home today.   Objective:   Blood pressure 145/70, pulse 90, temperature 97.8 F (36.6 C), temperature source Oral, resp. rate 18, height 5\' 9"  (1.753 m), weight 92.3 kg (203 lb 7.8 oz), SpO2 96.00%.  Intake/Output Summary (Last 24 hours) at 07/08/12 1033 Last data filed at 07/08/12 0645  Gross per 24 hour  Intake    780 ml  Output      0 ml  Net    780 ml    Exam Awake Alert, Oriented *3, No new F.N deficits, Normal affect [place].AT,PERRAL Supple Neck,No JVD, No cervical lymphadenopathy appriciated.  Symmetrical Chest wall movement, Good air movement bilaterally, CTAB RRR,No Gallops,Rubs or new Murmurs, No Parasternal Heave +ve B.Sounds, Abd Soft, Non tender, No organomegaly appriciated, No rebound -guarding or rigidity. No Cyanosis, Clubbing or edema, No new Rash or bruise  DISCHARGE CONDITION: Stable  DISPOSITION: HOME With home health RN  DISCHARGE INSTRUCTIONS:    Activity:  As tolerated   Diet  recommendation: Heart Healthy diet      Follow-up Information   Follow up with Kaleen Mask, MD. Schedule an appointment as soon as possible for a visit in 1 week.   Contact information:   [address] [place] [address] [phone]       Follow up with Staci Righter, MD. (The Infectious Disease Clinic will call you with Follow up appointment)    Contact information:   [address] [place] [address] [phone]       Follow up with Cristi Loron, MD. Schedule an appointment as soon as possible for a visit in 2 weeks.   Contact information:   [address], [address] [place]  [place] [address] [phone]         Total Time spent on discharge equals 45 minutes.  SignedJeoffrey Massed 07/08/2012 10:33 AM

## 2012-07-08 NOTE — Progress Notes (Signed)
[facility]  Date of Admission:  07/04/2012  Antibiotics: Vancomycin/Rocephin day 5  Subjective: No new complaints  Objective: Temp:  [97.6 F (36.4 C)-98.4 F (36.9 C)] 97.8 F (36.6 C) (03/17 0413) Pulse Rate:  [84-90] 90 (03/17 0413) Resp:  [16-18] 18 (03/17 0413) BP: (132-155)/(65-79) 145/70 mmHg (03/17 0413) SpO2:  [93 %-96 %] 96 % (03/17 0413)   Lab Results Lab Results  Component Value Date   WBC 7.1 07/06/2012   HGB 12.6* 07/06/2012   HCT 38.9* 07/06/2012   MCV 90.3 07/06/2012   PLT 266 07/06/2012    Lab Results  Component Value Date   CREATININE 0.73 07/08/2012   BUN 9 07/08/2012   NA 140 07/08/2012   K 3.7 07/08/2012   CL 105 07/08/2012   CO2 28 07/08/2012    Lab Results  Component Value Date   ALT 10 11/08/2011   AST 12 11/08/2011   ALKPHOS 86 11/08/2011   BILITOT 0.4 11/08/2011      Microbiology: Recent Results (from the past 240 hour(s))  CULTURE, BLOOD (ROUTINE X 2)     Status: None   Collection Time    07/04/12  7:30 PM      Result Value Range Status   Specimen Description BLOOD ARM RIGHT   Final   Special Requests BOTTLES DRAWN AEROBIC AND ANAEROBIC 10CC   Final   Culture  Setup Time 07/05/2012 02:36   Final   Culture     Final   Value:        BLOOD CULTURE RECEIVED NO GROWTH TO DATE CULTURE WILL BE HELD FOR 5 DAYS BEFORE ISSUING A FINAL NEGATIVE REPORT   Report Status PENDING   Incomplete  CULTURE, BLOOD (ROUTINE X 2)     Status: None   Collection Time    07/04/12  7:45 PM      Result Value Range Status   Specimen Description BLOOD ARM RIGHT   Final   Special Requests BOTTLES DRAWN AEROBIC AND ANAEROBIC 10CC   Final   Culture  Setup Time 07/05/2012 02:36   Final   Culture     Final   Value:        BLOOD CULTURE RECEIVED NO GROWTH TO DATE CULTURE WILL BE HELD FOR 5 DAYS BEFORE ISSUING A FINAL NEGATIVE REPORT   Report Status PENDING   Incomplete  MRSA PCR SCREENING     Status: Abnormal   Collection Time    07/05/12   9:20 AM      Result Value Range Status   MRSA by PCR POSITIVE (*) NEGATIVE Final   Comment:            The GeneXpert MRSA Assay (FDA     approved for NASAL specimens     only), is one component of a     comprehensive MRSA colonization     surveillance program. It is not     intended to diagnose MRSA     infection nor to guide or     monitor treatment for     MRSA infections.     RESULT CALLED TO, READ BACK BY AND VERIFIED WITH:     Hilarie Fredrickson RN 12:15 07/05/12 ([email or web address])  AFB CULTURE WITH SMEAR     Status: None   Collection Time    07/05/12  5:12 PM      Result Value Range Status   Specimen Description WOUND   Final   Special Requests T6 T7 DISC  SPACE ASPIRATION   Final   ACID FAST SMEAR NO ACID FAST BACILLI SEEN   Final   Culture     Final   Value: CULTURE WILL BE EXAMINED FOR 6 WEEKS BEFORE ISSUING A FINAL REPORT   Report Status PENDING   Incomplete  FUNGUS CULTURE W SMEAR     Status: None   Collection Time    07/05/12  5:12 PM      Result Value Range Status   Specimen Description WOUND   Final   Special Requests T6 T7 DISC SPACE ASPIRATION   Final   Fungal Smear NO YEAST OR FUNGAL ELEMENTS SEEN   Final   Culture CULTURE IN PROGRESS FOR FOUR WEEKS   Final   Report Status PENDING   Incomplete  WOUND CULTURE     Status: None   Collection Time    07/05/12  5:12 PM      Result Value Range Status   Specimen Description WOUND   Final   Special Requests T6 T7 DISC SPACE ASPIRATION   Final   Gram Stain     Final   Value: RARE WBC PRESENT, PREDOMINANTLY PMN     NO SQUAMOUS EPITHELIAL CELLS SEEN     NO ORGANISMS SEEN   Culture NO GROWTH 2 DAYS   Final   Report Status 07/08/2012 FINAL   Final    Studies/Results: No results found.  Assessment/Plan: 1) epidural abscess - culture negative.  On vanco and ceftriaxone -will need 6-8 weeks through at least 4/25 (6 weeks) -weekly cbc, cmp, vanco trough per home health protocol -we will arrange follow up in 2-3 weeks  Staci Righter, MD [facility] for Infectious Disease [facility] [phone] pager   07/08/2012, 10:34 AM

## 2012-07-08 NOTE — Progress Notes (Signed)
PATIENT DETAILS Name: MALEKI HIPPE Age: 79 y.o. Sex: male Date of Birth: [birth year] Admit Date: 07/04/2012 Admitting Physician Tarry Kos, MD ZOX:[id number] Joelene Millin, MD  Subjective: No major issues overnight  Assessment/Plan: Principal Problem:   Discitis of thoracic region -afebrile, no leukocytosis-neuro exam unchanged -s/p thoracic disk bx on 07/05/12- culture neg so far -blood cultures on 07/04/12 neg so far as well. - Appreciate ID eval and NS eval - On empiric vancomycin and Rocephin-PICC line placed on 3/16. -Spoke with Dr Luciana Axe this am-plan for 6 weeks of Vanco/Rocephin, needs weekly troughs/cbc/bmet to be faxed over to ID clinic. Ok to discharge today from his point of view. Dr Luciana Axe will arrange for follow up.  HTN -c/w Amlodipine-BP controlled-assess in outpatient setting whether Amlodipine needs to increased to 10 MG or another agent needs to be added.  GERD -PPI  Disposition: Remain inpatient-possible d/c later today with home health services for IV abx  DVT Prophylaxis: SCD's  Code Status: Full code   Procedures:  None  CONSULTS:  ID Neurosurgery IR  PHYSICAL EXAM: Vital signs in last 24 hours: Filed Vitals:   07/07/12 0411 07/07/12 1444 07/07/12 2145 07/08/12 0413  BP: 155/77 132/65 155/79 145/70  Pulse: 69 84 85 90  Temp: 98 F (36.7 C) 98.4 F (36.9 C) 97.6 F (36.4 C) 97.8 F (36.6 C)  TempSrc: Oral Oral Oral Oral  Resp: 18 18 16 18   Height:      Weight:      SpO2: 93% 93% 94% 96%    Weight change:  Body mass index is 30.04 kg/(m^2).   Gen Exam: Awake and alert with clear speech.   Neck: Supple, No JVD.   Chest: B/L Clear.  No rales or rhonchi CVS: S1 S2 Regular, no murmurs.  Abdomen: soft, BS +, non tender, non distended.  Extremities: no edema, lower extremities warm to touch. Neurologic: Non Focal.  Good 5/5 strenght all over.No sensory deficits Skin: No Rash.   Wounds: N/A.    Intake/Output from previous  day:  Intake/Output Summary (Last 24 hours) at 07/08/12 1016 Last data filed at 07/08/12 0645  Gross per 24 hour  Intake    780 ml  Output      0 ml  Net    780 ml     LAB RESULTS: CBC  Recent Labs Lab 07/04/12 1516 07/06/12 0545  WBC 7.9 7.1  HGB 13.5 12.6*  HCT 40.2 38.9*  PLT 273 266  MCV 88.9 90.3  MCH 29.9 29.2  MCHC 33.6 32.4  RDW 13.3 13.4  LYMPHSABS 1.3  --   MONOABS 0.5  --   EOSABS 0.3  --   BASOSABS 0.0  --     Chemistries   Recent Labs Lab 07/04/12 1516 07/06/12 0545 07/08/12 0500  NA 142 139 140  K 3.7 4.0 3.7  CL 104 105 105  CO2 29 26 28   GLUCOSE 109* 95 95  BUN 15 10 9   CREATININE 0.93 0.94 0.73  CALCIUM 9.0 9.0 8.8    CBG: No results found for this basename: GLUCAP,  in the last 168 hours  GFR Estimated Creatinine Clearance: 84 ml/min (by C-G formula based on Cr of 0.73).  Coagulation profile  Recent Labs Lab 07/05/12 0810  INR 1.11    Cardiac Enzymes No results found for this basename: CK, CKMB, TROPONINI, MYOGLOBIN,  in the last 168 hours  No components found with this basename: POCBNP,  No results found for this basename: DDIMER,  in the last 72 hours No results found for this basename: HGBA1C,  in the last 72 hours No results found for this basename: CHOL, HDL, LDLCALC, TRIG, CHOLHDL, LDLDIRECT,  in the last 72 hours No results found for this basename: TSH, T4TOTAL, FREET3, T3FREE, THYROIDAB,  in the last 72 hours No results found for this basename: VITAMINB12, FOLATE, FERRITIN, TIBC, IRON, RETICCTPCT,  in the last 72 hours No results found for this basename: LIPASE, AMYLASE,  in the last 72 hours  Urine Studies No results found for this basename: UACOL, UAPR, USPG, UPH, UTP, UGL, UKET, UBIL, UHGB, UNIT, UROB, ULEU, UEPI, UWBC, URBC, UBAC, CAST, CRYS, UCOM, BILUA,  in the last 72 hours  MICROBIOLOGY: Recent Results (from the past 240 hour(s))  CULTURE, BLOOD (ROUTINE X 2)     Status: None   Collection Time     07/04/12  7:30 PM      Result Value Range Status   Specimen Description BLOOD ARM RIGHT   Final   Special Requests BOTTLES DRAWN AEROBIC AND ANAEROBIC 10CC   Final   Culture  Setup Time 07/05/2012 02:36   Final   Culture     Final   Value:        BLOOD CULTURE RECEIVED NO GROWTH TO DATE CULTURE WILL BE HELD FOR 5 DAYS BEFORE ISSUING A FINAL NEGATIVE REPORT   Report Status PENDING   Incomplete  CULTURE, BLOOD (ROUTINE X 2)     Status: None   Collection Time    07/04/12  7:45 PM      Result Value Range Status   Specimen Description BLOOD ARM RIGHT   Final   Special Requests BOTTLES DRAWN AEROBIC AND ANAEROBIC 10CC   Final   Culture  Setup Time 07/05/2012 02:36   Final   Culture     Final   Value:        BLOOD CULTURE RECEIVED NO GROWTH TO DATE CULTURE WILL BE HELD FOR 5 DAYS BEFORE ISSUING A FINAL NEGATIVE REPORT   Report Status PENDING   Incomplete  MRSA PCR SCREENING     Status: Abnormal   Collection Time    07/05/12  9:20 AM      Result Value Range Status   MRSA by PCR POSITIVE (*) NEGATIVE Final   Comment:            The GeneXpert MRSA Assay (FDA     approved for NASAL specimens     only), is one component of a     comprehensive MRSA colonization     surveillance program. It is not     intended to diagnose MRSA     infection nor to guide or     monitor treatment for     MRSA infections.     RESULT CALLED TO, READ BACK BY AND VERIFIED WITH:     Hilarie Fredrickson RN 12:15 07/05/12 ([email or web address])  AFB CULTURE WITH SMEAR     Status: None   Collection Time    07/05/12  5:12 PM      Result Value Range Status   Specimen Description WOUND   Final   Special Requests T6 T7 DISC SPACE ASPIRATION   Final   ACID FAST SMEAR NO ACID FAST BACILLI SEEN   Final   Culture     Final   Value: CULTURE WILL BE EXAMINED FOR 6 WEEKS BEFORE ISSUING A FINAL REPORT   Report Status PENDING   Incomplete  FUNGUS CULTURE W SMEAR  Status: None   Collection Time    07/05/12  5:12 PM      Result Value Range  Status   Specimen Description WOUND   Final   Special Requests T6 T7 DISC SPACE ASPIRATION   Final   Fungal Smear NO YEAST OR FUNGAL ELEMENTS SEEN   Final   Culture CULTURE IN PROGRESS FOR FOUR WEEKS   Final   Report Status PENDING   Incomplete  WOUND CULTURE     Status: None   Collection Time    07/05/12  5:12 PM      Result Value Range Status   Specimen Description WOUND   Final   Special Requests T6 T7 DISC SPACE ASPIRATION   Final   Gram Stain     Final   Value: RARE WBC PRESENT, PREDOMINANTLY PMN     NO SQUAMOUS EPITHELIAL CELLS SEEN     NO ORGANISMS SEEN   Culture NO GROWTH 2 DAYS   Final   Report Status 07/08/2012 FINAL   Final    RADIOLOGY STUDIES/RESULTS: No results found.  MEDICATIONS: Scheduled Meds: . amLODipine  5 mg Oral Daily  . cefTRIAXone (ROCEPHIN)  IV  2 g Intravenous Q24H  . Chlorhexidine Gluconate Cloth  6 each Topical Q0600  . mupirocin ointment  1 application Nasal BID  . pantoprazole  40 mg Oral Q1200  . polyethylene glycol  17 g Oral Daily  . sodium chloride  10-40 mL Intracatheter Q12H  . vancomycin  1,250 mg Intravenous Q12H   Continuous Infusions:  PRN Meds:.HYDROcodone-acetaminophen, magnesium hydroxide, morphine injection, sodium chloride  Antibiotics: Anti-infectives   Start     Dose/Rate Route Frequency Ordered Stop   07/05/12 1900  cefTRIAXone (ROCEPHIN) 2 g in dextrose 5 % 50 mL IVPB     2 g 100 mL/hr over 30 Minutes Intravenous Every 24 hours 07/05/12 1747     07/05/12 1830  cefTRIAXone (ROCEPHIN) 2 g in dextrose 5 % 50 mL IVPB  Status:  Discontinued     2 g 100 mL/hr over 30 Minutes Intravenous Every 24 hours 07/05/12 1743 07/05/12 1747   07/05/12 1830  vancomycin (VANCOCIN) 1,250 mg in sodium chloride 0.9 % 250 mL IVPB     1,250 mg 166.7 mL/hr over 90 Minutes Intravenous Every 12 hours 07/05/12 1801     07/05/12 1000  vancomycin (VANCOCIN) 1,250 mg in sodium chloride 0.9 % 250 mL IVPB  Status:  Discontinued     1,250 mg 166.7  mL/hr over 90 Minutes Intravenous Every 12 hours 07/04/12 2151 07/05/12 0721   07/05/12 0830  cefTRIAXone (ROCEPHIN) 2 g in dextrose 5 % 50 mL IVPB  Status:  Discontinued     2 g 100 mL/hr over 30 Minutes Intravenous Every 24 hours 07/05/12 0751 07/05/12 0753   07/04/12 2130  vancomycin (VANCOCIN) 2,000 mg in sodium chloride 0.9 % 500 mL IVPB     2,000 mg 250 mL/hr over 120 Minutes Intravenous NOW 07/04/12 2120 07/05/12 0008   07/04/12 2100  cefTRIAXone (ROCEPHIN) 1 g in dextrose 5 % 50 mL IVPB  Status:  Discontinued     1 g 100 mL/hr over 30 Minutes Intravenous Every 24 hours 07/04/12 2059 07/05/12 0721   07/04/12 1915  vancomycin (VANCOCIN) 2,000 mg in sodium chloride 0.9 % 500 mL IVPB  Status:  Discontinued     2,000 mg 250 mL/hr over 120 Minutes Intravenous  Once 07/04/12 1905 07/04/12 1937   07/04/12 1915  cefTRIAXone (ROCEPHIN) 1 g in  dextrose 5 % 50 mL IVPB  Status:  Discontinued     1 g 100 mL/hr over 30 Minutes Intravenous  Once 07/04/12 1905 07/04/12 Velvet Bathe, MD  [facility] Pager:[phone]  If 7PM-7AM, please contact night-coverage [email or web address] Password [email or web address] 07/08/2012, 10:16 AM   LOS: 4 days

## 2012-07-11 LAB — CULTURE, BLOOD (ROUTINE X 2): Culture: NO GROWTH

## 2012-07-30 ENCOUNTER — Ambulatory Visit (INDEPENDENT_AMBULATORY_CARE_PROVIDER_SITE_OTHER): Payer: Medicare Other | Admitting: Internal Medicine

## 2012-07-30 ENCOUNTER — Encounter: Payer: Self-pay | Admitting: Internal Medicine

## 2012-07-30 VITALS — BP 159/73 | HR 105 | Temp 98.2°F | Ht 69.0 in | Wt 211.0 lb

## 2012-07-30 DIAGNOSIS — M519 Unspecified thoracic, thoracolumbar and lumbosacral intervertebral disc disorder: Secondary | ICD-10-CM

## 2012-07-30 DIAGNOSIS — M4644 Discitis, unspecified, thoracic region: Secondary | ICD-10-CM

## 2012-07-30 MED ORDER — LORATADINE 10 MG PO TABS: 10.0000 mg | ORAL_TABLET | Freq: Every day | ORAL | Status: DC

## 2012-07-30 NOTE — Progress Notes (Signed)
  Subjective:    Patient ID: Craig Waters, male    DOB: [birth year], 79 y.o.   MRN: [id number]  HPI He comes in here for followup of his epidural abscess and discitis.  He was initially seen by his primary physician to 2 back pain and hand pain for about a month and went to chiropractor but had no relief. He saw his PCP and did get an MRI which showed T6-T7 discitis and osteomyelitis. MRI report was never encountered. He did get disc aspiration which did show the affected area however was culture negative. It was done off of antibiotics. He was then started empirically on vancomycin and Rocephin and comes in here about 3 weeks into his course of antibiotics. He is doing well though he does continue to complain of pain. He has no significant fever or chills. He continues to take his medication as prescribed.  He is also complaining of some mucus   Review of Systems  Constitutional: Negative for fever, chills, appetite change, fatigue and unexpected weight change.  HENT: Negative for congestion, rhinorrhea, postnasal drip and sinus pressure.        Mucous congestion with some relief with Mucinex  Respiratory: Negative for cough, shortness of breath and wheezing.   Gastrointestinal: Negative for nausea, abdominal pain and diarrhea.  Musculoskeletal: Positive for back pain.  Neurological: Negative for dizziness.       Objective:   Physical Exam  Constitutional: He appears well-developed and well-nourished. No distress.  Cardiovascular: Normal rate, regular rhythm and normal heart sounds.  Exam reveals no gallop and no friction rub.   No murmur heard. Musculoskeletal:  Back with some mild muscle spasm on his left side around the T6-T7 area          Assessment & Plan:

## 2012-07-30 NOTE — Assessment & Plan Note (Addendum)
He does continue his treatment for culture negative discitis and osteomyelitis. He is on appropriate empiric therapy and tolerating it well. I will have him complete an 8 week course which will be through May 9. He will return at the end of treatment and we will consider pulling the PICC line at that time.  I have been evaluating his labs [facility]. He has had appropriate vancomycin adjustment for a low trough.  I did prescribe him Claritin for his allergy symptoms.  I did remind him that I did not prescribe his pain medications he should get these from his primary physician.

## 2012-08-02 LAB — FUNGUS CULTURE W SMEAR: Fungal Smear: NONE SEEN

## 2012-08-15 ENCOUNTER — Encounter: Payer: Self-pay | Admitting: Internal Medicine

## 2012-08-18 LAB — AFB CULTURE WITH SMEAR (NOT AT ARMC): Acid Fast Smear: NONE SEEN

## 2012-08-20 ENCOUNTER — Encounter: Payer: Self-pay | Admitting: Internal Medicine

## 2012-08-27 ENCOUNTER — Ambulatory Visit: Payer: Medicare Other | Admitting: Internal Medicine

## 2012-08-29 ENCOUNTER — Encounter: Payer: Self-pay | Admitting: Internal Medicine

## 2012-08-29 ENCOUNTER — Ambulatory Visit (INDEPENDENT_AMBULATORY_CARE_PROVIDER_SITE_OTHER): Payer: Medicare Other | Admitting: Internal Medicine

## 2012-08-29 VITALS — BP 162/65 | HR 97 | Temp 98.3°F | Wt 215.0 lb

## 2012-08-29 DIAGNOSIS — G062 Extradural and subdural abscess, unspecified: Secondary | ICD-10-CM

## 2012-08-29 NOTE — Progress Notes (Signed)
[facility] NOTE  RFV: epidural abscess and discitis, culture negative Subjective:    Patient ID: Craig Waters, male    DOB: 1931-04-04, 79 y.o.   MRN: [id number]  HPI  He comes in here for followup of his epidural abscess and discitis. He was initially seen by his primary physician to 2 back pain and hand pain for about a month and went to chiropractor but had no relief. He saw his PCP and did get an MRI which showed T6-T7 discitis and osteomyelitis. MRI report was never encountered. He did get disc aspiration which did show the affected area however was culture negative. It was done off of antibiotics. He was then started empirically on vancomycin and Rocephin and comes in here about 3 weeks into his course of antibiotics. He is doing well though he does continue to complain of pain. He has no significant fever or chills. He continues to take his medication as prescribed.  At the end of his 8 wks of IV antibiotic therapy  Review of Systems No fever, chills, nightsweats, no diarrhea, no problems with picc line. Feels lack of energy due to antibiotics    Objective:   Physical Exam BP 162/65  Pulse 97  Temp(Src) 98.3 F (36.8 C) (Oral)  Wt 215 lb (97.523 kg)  BMI 31.74 kg/m2 Physical Exam  Constitutional: He is oriented to person, place, and time. He appears well-developed and well-nourished. No distress.  HENT:  Mouth/Throat: Oropharynx is clear and moist. No oropharyngeal exudate.  Cardiovascular: Normal rate, regular rhythm and normal heart sounds. Exam reveals no gallop and no friction rub.  No murmur heard.  Pulmonary/Chest: Effort normal and breath sounds normal. No respiratory distress. He has no wheezes.  Abdominal: Soft. Bowel sounds are normal. He exhibits no distension. There is no tenderness.  Lymphadenopathy:  He has no cervical adenopathy.  Neurological: He is alert and oriented to person, place, and time.  Skin: Skin is warm and dry. No rash noted. No erythema.   Psychiatric: He has a normal mood and affect. His behavior is normal.  Ext: right picc line c/d/i     Assessment & Plan:  Diskitis = will pull picc line; check esr and crp, has finished greater than 6 wks of IV antibiotics. If inflammatory markers still elevated, will switch to oral medicaitons.  Lab Results  Component Value Date   CRP <0.5 08/29/2012   Lab Results  Component Value Date   ESRSEDRATE 6 08/29/2012   No need for oral antibiotics  Dr. Shelah Lewandowsky PCP

## 2012-08-29 NOTE — Progress Notes (Signed)
Per Dr Drue Second 41 cm PICC removed from patient's right arm. Site unremarkable, no sutures in place.  Petroleum gauze dressing applied to area. Pt advised no heavy lifting and to leave dressing in place for 24 hours.  Pt tolerated the procedure well.   Laurell Josephs, RN

## 2012-08-30 LAB — C-REACTIVE PROTEIN: CRP: <0.5 mg/dL (ref <0.60)

## 2012-09-04 ENCOUNTER — Encounter: Payer: Self-pay | Admitting: Internal Medicine

## 2012-09-26 ENCOUNTER — Encounter: Payer: Self-pay | Admitting: Internal Medicine

## 2012-11-05 ENCOUNTER — Encounter: Payer: Self-pay | Admitting: Internal Medicine

## 2012-11-05 ENCOUNTER — Ambulatory Visit (INDEPENDENT_AMBULATORY_CARE_PROVIDER_SITE_OTHER): Payer: Medicare Other | Admitting: Internal Medicine

## 2012-11-05 VITALS — BP 160/80 | HR 80 | Temp 98.1°F | Resp 16 | Ht 68.5 in | Wt 221.0 lb

## 2012-11-05 DIAGNOSIS — S61209A Unspecified open wound of unspecified finger without damage to nail, initial encounter: Secondary | ICD-10-CM

## 2012-11-05 DIAGNOSIS — M25549 Pain in joints of unspecified hand: Secondary | ICD-10-CM

## 2012-11-05 NOTE — Progress Notes (Signed)
  Subjective:    Patient ID: Craig Waters., male    DOB: [birth year], 79 y.o.   MRN: [id number]  HPI Cut finger on metal piece Bleeding contr by pressure  Patient Active Problem List   Diagnosis Date Noted  . Discitis of thoracic region 07/04/2012  . Hypokalemia 11/11/2011  . Encephalopathy acute 11/09/2011  . HTN (hypertension) 11/09/2011  . UTI (lower urinary tract infection) 11/08/2011  . Altered mental status 11/08/2011  . BPH (benign prostatic hyperplasia) 09/26/2010  . PUD (peptic ulcer disease) 09/26/2010  . GERD (gastroesophageal reflux disease) 09/26/2010  . Glaucoma 09/26/2010  . Diverticulitis 09/26/2010   Current outpatient prescriptions:amLODipine (NORVASC) 5 MG tablet, Take 1 tablet (5 mg total) by mouth daily., Disp: 30 tablet, Rfl: 0;  dextrose 5 % SOLN 50 mL with cefTRIAXone 2 G SOLR, 2 g, Intravenous, for 30 Minutes, Every 24 hours, For 6 weeks from  07/05/12, Disp: , Rfl: ;  guaiFENesin (MUCINEX) 600 MG 12 hr tablet, Take 600 mg by mouth 2 (two) times daily., Disp: , Rfl:  HYDROcodone-acetaminophen (NORCO/VICODIN) 5-325 MG per tablet, Take 1-2 tablets by mouth every 6 (six) hours as needed., Disp: 30 tablet, Rfl: 0;  ibuprofen (ADVIL,MOTRIN) 200 MG tablet, Take 600 mg by mouth daily as needed. For pain, Disp: , Rfl: ;  loratadine (CLARITIN) 10 MG tablet, Take 1 tablet (10 mg total) by mouth daily., Disp: 30 tablet, Rfl: 5 polyethylene glycol (MIRALAX / GLYCOLAX) packet, Take 17 g by mouth daily., Disp: 14 each, Rfl: 0;  sodium chloride 0.9 % SOLN 250 mL with vancomycin 10 G SOLR, Inject 1,750 mg into the vein daily. 1,750 mg, Intravenous, for 90 Minutes daily starting 07/30/12, Disp: , Rfl:   Td 2-3 yr ago  Review of Systems noncontr    Objective:   Physical Exam BP 160/80  Pulse 80  Temp(Src) 98.1 F (36.7 C) (Oral)  Resp 16  Ht 5' 8.5" (1.74 m)  Wt 221 lb (100.245 kg)  BMI 33.11 kg/m2  SpO2 99% Wound Base of R index radial aspect/angled sens intact  distally Flexors intact  Wound repair PAC Dunn       Assessment & Plan:  Wound, open, finger, initial encounter  Wound care sr 10d

## 2012-11-05 NOTE — Progress Notes (Signed)
Procedure:  Verbal consent was obtained.  Metacarpal block of RIGHT 2nd digit with 1:1 ratio of 2% lidocaine and 0.5% marcaine.  Wound explored.  5-0 prolene SI 5#.  Dressing applied.  Wound care discussed with patient.  Wound inspected. No deep structures involved. Wound repair watched by myself.  Agree with above.   Eula Listen, PA-C 11/05/2012 12:39 PM I have reviewed and agree with documentation. Robert P. Merla Riches, M.D.

## 2012-11-13 ENCOUNTER — Ambulatory Visit (INDEPENDENT_AMBULATORY_CARE_PROVIDER_SITE_OTHER): Payer: Medicare Other | Admitting: Physician Assistant

## 2012-11-13 VITALS — BP 132/82 | HR 82 | Temp 98.0°F | Resp 17 | Ht 68.5 in | Wt 218.0 lb

## 2012-11-13 DIAGNOSIS — Z4802 Encounter for removal of sutures: Secondary | ICD-10-CM

## 2012-11-13 NOTE — Progress Notes (Signed)
  Subjective:    Patient ID: Craig Waters., male    DOB: 08/02/1930, 79 y.o.   MRN: [id number]  HPI   Craig Waters is a very pleasant 79 yr old male here for suture removal.  Sutures were placed here 8 days ago.  Pt states he is doing well.  Has been keeping the area covered.  Doing his best to keep it clean and dry.  Denies drainage or bleeding.  Some tenderness remains.  No fever or chills.   Review of Systems  Skin: Positive for wound.  All other systems reviewed and are negative.       Objective:   Physical Exam  Constitutional: He is oriented to person, place, and time. He appears well-developed and well-nourished. No distress.  HENT:  Head: Normocephalic and atraumatic.  Eyes: Conjunctivae are normal. No scleral icterus.  Pulmonary/Chest: Effort normal.  Musculoskeletal:       Hands: Healing laceration of right hand; #5 sutures removed without difficulty; placed 1 steristrip across wound for extra support   Neurological: He is alert and oriented to person, place, and time.  Skin: Skin is warm and dry.  Psychiatric: He has a normal mood and affect. His behavior is normal.        Assessment & Plan:  Visit for suture removal   Craig Waters is a very pleasant 79 yr old male here for suture removal.  Sutures removed without difficulty.  One steristrip placed across the wound.  Pt to RTC if concerns arise.

## 2013-07-30 ENCOUNTER — Ambulatory Visit
Admission: RE | Admit: 2013-07-30 | Discharge: 2013-07-30 | Disposition: A | Discharge status: Home / Self Care | Payer: Medicare Other | Source: Ambulatory Visit | Attending: Family Medicine | Admitting: Family Medicine

## 2013-07-30 ENCOUNTER — Other Ambulatory Visit: Payer: Self-pay | Admitting: Family Medicine

## 2013-07-30 DIAGNOSIS — R05 Cough: Secondary | ICD-10-CM

## 2013-07-30 DIAGNOSIS — R059 Cough, unspecified: Secondary | ICD-10-CM

## 2014-05-14 ENCOUNTER — Encounter (HOSPITAL_COMMUNITY): Payer: Self-pay | Admitting: *Deleted

## 2014-05-14 ENCOUNTER — Inpatient Hospital Stay (HOSPITAL_COMMUNITY)
Admission: EM | Admit: 2014-05-14 | Discharge: 2014-05-15 | DRG: 690 | Disposition: A | Discharge status: Home / Self Care | Payer: Medicare Other | Attending: Internal Medicine | Admitting: Internal Medicine

## 2014-05-14 ENCOUNTER — Emergency Department (HOSPITAL_COMMUNITY): Payer: Medicare Other

## 2014-05-14 DIAGNOSIS — H409 Unspecified glaucoma: Secondary | ICD-10-CM | POA: Diagnosis present

## 2014-05-14 DIAGNOSIS — Z9841 Cataract extraction status, right eye: Secondary | ICD-10-CM

## 2014-05-14 DIAGNOSIS — M1990 Unspecified osteoarthritis, unspecified site: Secondary | ICD-10-CM | POA: Diagnosis present

## 2014-05-14 DIAGNOSIS — K219 Gastro-esophageal reflux disease without esophagitis: Secondary | ICD-10-CM

## 2014-05-14 DIAGNOSIS — Z9079 Acquired absence of other genital organ(s): Secondary | ICD-10-CM | POA: Diagnosis present

## 2014-05-14 DIAGNOSIS — R7881 Bacteremia: Secondary | ICD-10-CM

## 2014-05-14 DIAGNOSIS — Z9842 Cataract extraction status, left eye: Secondary | ICD-10-CM

## 2014-05-14 DIAGNOSIS — N390 Urinary tract infection, site not specified: Principal | ICD-10-CM

## 2014-05-14 DIAGNOSIS — Z86010 Personal history of colonic polyps: Secondary | ICD-10-CM

## 2014-05-14 DIAGNOSIS — Z885 Allergy status to narcotic agent status: Secondary | ICD-10-CM

## 2014-05-14 DIAGNOSIS — M130 Polyarthritis, unspecified: Secondary | ICD-10-CM

## 2014-05-14 DIAGNOSIS — I10 Essential (primary) hypertension: Secondary | ICD-10-CM

## 2014-05-14 DIAGNOSIS — R52 Pain, unspecified: Secondary | ICD-10-CM | POA: Diagnosis not present

## 2014-05-14 DIAGNOSIS — Z87891 Personal history of nicotine dependence: Secondary | ICD-10-CM

## 2014-05-14 LAB — CBC WITH DIFFERENTIAL/PLATELET
BASOS ABS: 0 10*3/uL (ref 0.0–0.1)
Basophils Relative: 0 % (ref 0–1)
EOS ABS: 0.1 10*3/uL (ref 0.0–0.7)
EOS PCT: 1 % (ref 0–5)
HCT: 43.4 % (ref 39.0–52.0)
HEMOGLOBIN: 14.4 g/dL (ref 13.0–17.0)
Lymphocytes Relative: 8 % — ABNORMAL LOW (ref 12–46)
Lymphs Abs: 1.2 10*3/uL (ref 0.7–4.0)
MCH: 30.8 pg (ref 26.0–34.0)
MCHC: 33.2 g/dL (ref 30.0–36.0)
MCV: 92.9 fL (ref 78.0–100.0)
MONO ABS: 0.8 10*3/uL (ref 0.1–1.0)
MONOS PCT: 6 % (ref 3–12)
NEUTROS ABS: 12.8 10*3/uL — AB (ref 1.7–7.7)
Neutrophils Relative %: 85 % — ABNORMAL HIGH (ref 43–77)
PLATELETS: 438 10*3/uL — AB (ref 150–400)
RBC: 4.67 MIL/uL (ref 4.22–5.81)
RDW: 12.8 % (ref 11.5–15.5)
WBC: 15 10*3/uL — ABNORMAL HIGH (ref 4.0–10.5)

## 2014-05-14 LAB — URINALYSIS, ROUTINE W REFLEX MICROSCOPIC
Glucose, UA: 100 mg/dL — AB
Ketones, ur: 15 mg/dL — AB
NITRITE: NEGATIVE
Protein, ur: 100 mg/dL — AB
Specific Gravity, Urine: 1.022 (ref 1.005–1.030)
Urobilinogen, UA: 4 mg/dL — ABNORMAL HIGH (ref 0.0–1.0)
pH: 6 (ref 5.0–8.0)

## 2014-05-14 LAB — HEPATIC FUNCTION PANEL
ALK PHOS: 92 U/L (ref 39–117)
ALT: 41 U/L (ref 0–53)
AST: 45 U/L — ABNORMAL HIGH (ref 0–37)
Albumin: 2.3 g/dL — ABNORMAL LOW (ref 3.5–5.2)
BILIRUBIN TOTAL: 1 mg/dL (ref 0.3–1.2)
Bilirubin, Direct: 0.3 mg/dL (ref 0.0–0.5)
Indirect Bilirubin: 0.7 mg/dL (ref 0.3–0.9)
Total Protein: 6.3 g/dL (ref 6.0–8.3)

## 2014-05-14 LAB — BASIC METABOLIC PANEL
Anion gap: 11 (ref 5–15)
BUN: 23 mg/dL (ref 6–23)
CHLORIDE: 99 meq/L (ref 96–112)
CO2: 27 mmol/L (ref 19–32)
Calcium: 8.7 mg/dL (ref 8.4–10.5)
Creatinine, Ser: 1.08 mg/dL (ref 0.50–1.35)
GFR calc Af Amer: 72 mL/min — ABNORMAL LOW (ref >90)
GFR calc non Af Amer: 62 mL/min — ABNORMAL LOW (ref >90)
GLUCOSE: 200 mg/dL — AB (ref 70–99)
Potassium: 4.4 mmol/L (ref 3.5–5.1)
Sodium: 137 mmol/L (ref 135–145)

## 2014-05-14 LAB — URINE MICROSCOPIC-ADD ON

## 2014-05-14 LAB — I-STAT CG4 LACTIC ACID, ED: Lactic Acid, Venous: 2.59 mmol/L (ref 0.5–2.0)

## 2014-05-14 LAB — SEDIMENTATION RATE: Sed Rate: 73 mm/hr — ABNORMAL HIGH (ref 0–16)

## 2014-05-14 MED ORDER — PIPERACILLIN-TAZOBACTAM 3.375 G IVPB 30 MIN
3.3750 g | Freq: Once | INTRAVENOUS | Status: AC
  Administered 2014-05-14: 3.375 g via INTRAVENOUS
  Filled 2014-05-14: qty 50

## 2014-05-14 MED ORDER — HYDROMORPHONE HCL 1 MG/ML IJ SOLN
1.0000 mg | Freq: Once | INTRAMUSCULAR | Status: AC
  Administered 2014-05-14: 1 mg via INTRAVENOUS
  Filled 2014-05-14: qty 1

## 2014-05-14 MED ORDER — VANCOMYCIN HCL IN DEXTROSE 1-5 GM/200ML-% IV SOLN
1000.0000 mg | Freq: Two times a day (BID) | INTRAVENOUS | Status: DC
  Administered 2014-05-15: 1000 mg via INTRAVENOUS
  Filled 2014-05-14 (×2): qty 200

## 2014-05-14 MED ORDER — SODIUM CHLORIDE 0.9 % IV SOLN
INTRAVENOUS | Status: DC
  Administered 2014-05-14: 21:00:00 via INTRAVENOUS

## 2014-05-14 MED ORDER — PIPERACILLIN-TAZOBACTAM 3.375 G IVPB
3.3750 g | Freq: Three times a day (TID) | INTRAVENOUS | Status: DC
  Administered 2014-05-15 (×2): 3.375 g via INTRAVENOUS
  Filled 2014-05-14 (×4): qty 50

## 2014-05-14 MED ORDER — ACETAMINOPHEN 325 MG PO TABS
650.0000 mg | ORAL_TABLET | Freq: Once | ORAL | Status: AC
  Administered 2014-05-14: 650 mg via ORAL
  Filled 2014-05-14: qty 2

## 2014-05-14 MED ORDER — SODIUM CHLORIDE 0.9 % IV BOLUS (SEPSIS)
1000.0000 mL | INTRAVENOUS | Status: AC
  Administered 2014-05-14 (×3): 1000 mL via INTRAVENOUS

## 2014-05-14 MED ORDER — VANCOMYCIN HCL 10 G IV SOLR
1500.0000 mg | Freq: Once | INTRAVENOUS | Status: AC
  Administered 2014-05-14: 1500 mg via INTRAVENOUS
  Filled 2014-05-14: qty 1500

## 2014-05-14 MED ORDER — VANCOMYCIN HCL IN DEXTROSE 1-5 GM/200ML-% IV SOLN: 1000.0000 mg | Freq: Once | INTRAVENOUS | Status: DC

## 2014-05-14 NOTE — Progress Notes (Signed)
ANTIBIOTIC CONSULT NOTE - INITIAL  Pharmacy Consult for vancomycin + zosyn Indication: rule out sepsis  Allergies  Allergen Reactions  . Other Other (See Comments)    Pain med-causes hallucinations (unknown drug name)    Patient Measurements:   Adjusted Body Weight:   Vital Signs: Temp: 101.4 F (38.6 C) (01/21 2050) Temp Source: Oral (01/21 1942) BP: 115/57 mmHg (01/21 2215) Pulse Rate: 145 (01/21 2215) Intake/Output from previous day:   Intake/Output from this shift:    Labs:  Recent Labs  05/14/14 2041  WBC 15.0*  HGB 14.4  PLT 438*  CREATININE 1.08   CrCl cannot be calculated (Unknown ideal weight.). No results for input(s): VANCOTROUGH, VANCOPEAK, VANCORANDOM, GENTTROUGH, GENTPEAK, GENTRANDOM, TOBRATROUGH, TOBRAPEAK, TOBRARND, AMIKACINPEAK, AMIKACINTROU, AMIKACIN in the last 72 hours.   Microbiology: No results found for this or any previous visit (from the past 720 hour(s)).  Medical History: Past Medical History  Diagnosis Date  . Blood in stool   . Diverticulitis   . GERD (gastroesophageal reflux disease)   . Glaucoma   . Ulcer   . History of transfusion of whole blood   . Prostate disorder   . Shortness of breath 10-18-11    with exertion, cardiac cath done 7-8 yrs ago negative.  . Vertigo 10-18-11    inner ear issues occ. -tx. Bonine as needed  . Arthritis 10-18-11    back, fingers  . H/O hiatal hernia 10-18-11    noted on recent CXR  . Adenomatous colon polyp     Medications:  Anti-infectives    Start     Dose/Rate Route Frequency Ordered Stop   05/15/14 1000  vancomycin (VANCOCIN) IVPB 1000 mg/200 mL premix     1,000 mg200 mL/hr over 60 Minutes Intravenous Every 12 hours 05/14/14 2238     05/15/14 0330  piperacillin-tazobactam (ZOSYN) IVPB 3.375 g     3.375 g12.5 mL/hr over 240 Minutes Intravenous Every 8 hours 05/14/14 2239     05/14/14 2115  piperacillin-tazobactam (ZOSYN) IVPB 3.375 g     3.375 g100 mL/hr over 30 Minutes Intravenous   Once 05/14/14 2103 05/14/14 2202   05/14/14 2115  vancomycin (VANCOCIN) IVPB 1000 mg/200 mL premix  Status:  Discontinued     1,000 mg200 mL/hr over 60 Minutes Intravenous  Once 05/14/14 2103 05/14/14 2105   05/14/14 2115  vancomycin (VANCOCIN) 1,500 mg in sodium chloride 0.9 % 500 mL IVPB     1,500 mg250 mL/hr over 120 Minutes Intravenous  Once 05/14/14 2106       Assessment: Craig Waters presented to the ED with joint pain. To start empiric vancomycin + zosyn for possible sepsis. Tmax is 101.4 and WBC is elevated at 15. Lactic acid is also elevated at 2.59 and Scr is WNL.   Vanc 1/21>> Zosyn 1/21>>  Goal of Therapy:  Vancomycin trough level 15-20 mcg/ml  Plan:  1. Vancomycin 1500mg  IV x 1 then 1gm IV Q12H 2. Zosyn 3.375mg  IV x 1 over 30 min then 3.375gm IV Q8H (4 hr inf) 3. F/u renal fxn, C&S, clinical status and trough at [place], Rande Lawman 05/14/2014,10:39 PM

## 2014-05-14 NOTE — ED Provider Notes (Signed)
CSN: [id number]     Arrival date & time 05/14/14  1932 History   First MD Initiated Contact with Patient 05/14/14 2028     Chief Complaint  Patient presents with  . Joint Pain     (Consider location/radiation/quality/duration/timing/severity/associated sxs/prior Treatment) HPI Comments: Patient here complaining of one-week history of worsening joint pain. Symptoms started a month ago and are diffuse in nature involving his shoulders, elbows, knees, wrists. Denies any weight loss. Has had a increased temperature for the last 2 days. No vomiting or diarrhea. No recent tick bites. Patient works as a Dealer still. Has used over-the-counter medications without relief. No prior history of same. Has not seen a doctor for this. Denies any headache or photophobia. Symptoms persistent. He does note some dysuria as well 2.  The history is provided by the patient.    Past Medical History  Diagnosis Date  . Blood in stool   . Diverticulitis   . GERD (gastroesophageal reflux disease)   . Glaucoma   . Ulcer   . History of transfusion of whole blood   . Prostate disorder   . Shortness of breath 10-18-11    with exertion, cardiac cath done 7-8 yrs ago negative.  . Vertigo 10-18-11    inner ear issues occ. -tx. Bonine as needed  . Arthritis 10-18-11    back, fingers  . H/O hiatal hernia 10-18-11    noted on recent CXR  . Adenomatous colon polyp    Past Surgical History  Procedure Laterality Date  . Gastric ulcer  10-18-11    '91-blleding ulcer with blood transfusions after  . Appendectomy  10-18-11    age 34  . Cataract extraction, bilateral  10-18-11    bilateral   . Eye surgery  10-18-11    laser eye surgery s/p cataract bilaterally  . Cardiac catheterization  10-18-11    7-8 yrs ago-mild blockage,no tx. indicated  . Prostatectomy  10/27/2011    Procedure: PROSTATECTOMY SUPRAPUBIC;  Surgeon: Ailene Rud, MD;  Location: [facility];  Service: Urology;  Laterality: N/A;  Placement of  suprapubic tube  . Cystoscopy  10/27/2011    Procedure: CYSTOSCOPY FLEXIBLE;  Surgeon: Ailene Rud, MD;  Location: [facility];  Service: Urology;  Laterality: N/A;   Family History  Problem Relation Age of Onset  . Breast cancer Sister   . Dementia Mother   . Heart attack Father   . Colon cancer Neg Hx    History  Substance Use Topics  . Smoking status: Former Smoker -- 1.00 packs/day for 5 years    Types: Cigarettes    Quit date: 09/26/1978  . Smokeless tobacco: Current User    Types: Chew    Last Attempt to Quit: 11/01/2011  . Alcohol Use: No    Review of Systems  All other systems reviewed and are negative.     Allergies  Other  Home Medications   Prior to Admission medications   Medication Sig Start Date End Date Taking? Authorizing Provider  amLODipine (NORVASC) 5 MG tablet Take 1 tablet (5 mg total) by mouth daily. 07/08/12   Shanker Kristeen Mans, MD  dextrose 5 % SOLN 50 mL with cefTRIAXone 2 G SOLR 2 g, Intravenous, for 30 Minutes, Every 24 hours, For 6 weeks from  07/05/12 07/08/12   Jonetta Osgood, MD  HYDROcodone-acetaminophen (NORCO/VICODIN) 5-325 MG per tablet Take 1-2 tablets by mouth every 6 (six) hours as needed. 07/08/12   Shanker Kristeen Mans, MD  loratadine (  CLARITIN) 10 MG tablet Take 1 tablet (10 mg total) by mouth daily. 07/30/12   Thayer Headings, MD  polyethylene glycol [facility] / Floria Raveling) packet Take 17 g by mouth daily. 07/08/12   Shanker Kristeen Mans, MD   BP 142/80 mmHg  Pulse 94  Temp(Src) 101 F (38.3 C) (Oral)  Resp 16  SpO2 93% Physical Exam  Constitutional: He is oriented to person, place, and time. He appears well-developed and well-nourished.  Non-toxic appearance. No distress.  HENT:  Head: Normocephalic and atraumatic.  Eyes: Conjunctivae, EOM and lids are normal. Pupils are equal, round, and reactive to light.  Neck: Normal range of motion. Neck supple. No tracheal deviation present. No thyroid mass present.  Cardiovascular: Normal  heart sounds.  An irregular rhythm present. Tachycardia present.  Exam reveals no gallop.   No murmur heard. Pulmonary/Chest: Effort normal and breath sounds normal. No stridor. No respiratory distress. He has no decreased breath sounds. He has no wheezes. He has no rhonchi. He has no rales.  Abdominal: Soft. Normal appearance and bowel sounds are normal. He exhibits no distension. There is no tenderness. There is no rebound and no CVA tenderness.  Musculoskeletal: Normal range of motion. He exhibits no edema or tenderness.  No evidence of erythema or swelling at the shoulders, wrists, knees, ankles. No overt signs of septic joint  Neurological: He is alert and oriented to person, place, and time. He has normal strength. No cranial nerve deficit or sensory deficit. GCS eye subscore is 4. GCS verbal subscore is 5. GCS motor subscore is 6.  Skin: Skin is warm and dry. No abrasion and no rash noted.  Psychiatric: He has a normal mood and affect. His speech is normal and behavior is normal.  Nursing note and vitals reviewed.   ED Course  Procedures (including critical care time) Labs Review Labs Reviewed  CBC WITH DIFFERENTIAL - Abnormal; Notable for the following:    WBC 15.0 (*)    Platelets 438 (*)    Neutrophils Relative % 85 (*)    Neutro Abs 12.8 (*)    Lymphocytes Relative 8 (*)    All other components within normal limits  BASIC METABOLIC PANEL - Abnormal; Notable for the following:    Glucose, Bld 200 (*)    GFR calc non Af Amer 62 (*)    GFR calc Af Amer 72 (*)    All other components within normal limits  SEDIMENTATION RATE - Abnormal; Notable for the following:    Sed Rate 73 (*)    All other components within normal limits  URINALYSIS, ROUTINE W REFLEX MICROSCOPIC - Abnormal; Notable for the following:    Color, Urine AMBER (*)    APPearance CLOUDY (*)    Glucose, UA 100 (*)    Hgb urine dipstick LARGE (*)    Bilirubin Urine SMALL (*)    Ketones, ur 15 (*)    Protein,  ur 100 (*)    Urobilinogen, UA 4.0 (*)    Leukocytes, UA SMALL (*)    All other components within normal limits  HEPATIC FUNCTION PANEL - Abnormal; Notable for the following:    Albumin 2.3 (*)    AST 45 (*)    All other components within normal limits  I-STAT CG4 LACTIC ACID, ED - Abnormal; Notable for the following:    Lactic Acid, Venous 2.59 (*)    All other components within normal limits  CULTURE, BLOOD (ROUTINE X 2)  CULTURE, BLOOD (ROUTINE X 2)  URINE CULTURE  URINE MICROSCOPIC-ADD ON    Imaging Review Dg Chest Port 1 View  05/14/2014   CLINICAL DATA:  Joint pain  EXAM: PORTABLE CHEST - 1 VIEW  COMPARISON:  07/30/2013  FINDINGS: Hypoaeration which accentuates chronic elevation of the right diaphragm and exaggerates cardiac size - which is borderline enlarged. Aortic and hilar contours are stable from prior. There is no edema, consolidation, effusion, or pneumothorax.  IMPRESSION: Negative low volume chest.   Electronically Signed   By: Jorje Guild M.D.   On: 05/14/2014 21:20     EKG Interpretation   Date/Time:  Thursday May 14 2014 20:58:55 EST Ventricular Rate:  164 PR Interval:    QRS Duration: 90 QT Interval:  289 QTC Calculation: 477 R Axis:   -11 Text Interpretation:  Atrial fibrillation with rapid V-rate Nonspecific T  abnormalities, inferior leads Confirmed by Deakon Frix  MD, Asra Gambrel ([id number]) on  05/14/2014 10:18:17 PM      MDM   Final diagnoses:  None    Limited to sepsis activated due to his heart rate. Empiric antibiotics started. Urinalysis with infection. Mild lactic acidosis noted along with leukocytosis on CBC. Sedimentation rate also elevated and chest x-rays negative. Will be admitted to medicine for further management    Leota Jacobsen, MD 05/14/14 2312

## 2014-05-14 NOTE — H&P (Signed)
[facility] History and Physical  [name] KVQ:[id number] DOB: 1930/12/18 DOA: 05/14/2014  Referring physician: Lacretia Leigh, MD PCP: Leonard Downing, MD   Chief Complaint: Body Aches  HPI: Craig Waters. is a 81 y.o. male presents with multiple aches and pains. Patient states that about 3 weeks ago he noted all his joints were aching. He states that today he didn't have enough strength to exercise. He states that he had been noting some urinary problems. He states he has had a stone in the past. He states he has had blood in his urine frequently. He had been to a urologist but his daughter states he will not go back to see him. Patient states he had also noted body aches in the past. He has no cough no congestion. He has no headaches noted. He has had a sore throat. He has no ill contacts. He did not get a flu shot or pneumonia shot.    Review of Systems:  Constitutional:  No weight loss, night sweats, ++Fevers, ++chills, ++fatigue.  HEENT:  No headaches, Difficulty swallowing,Tooth/dental problems Cardio-vascular:  No chest pain, Orthopnea, PND, swelling in lower extremities GI:  ++ heartburn, ++indigestion, no abdominal pain, nausea, vomiting  Resp:  No shortness of breath with exertion or at rest. No coughing up of blood.No change in color of mucus Skin:  no rash or lesions GU:  no dysuria, change in color of urine, no urgency or frequency Musculoskeletal:  ++joint pain all over Psych:  No change in mood or affect. No depression or anxiety  Past Medical History  Diagnosis Date  . Blood in stool   . Diverticulitis   . GERD (gastroesophageal reflux disease)   . Glaucoma   . Ulcer   . History of transfusion of whole blood   . Prostate disorder   . Shortness of breath 10-18-11    with exertion, cardiac cath done 7-8 yrs ago negative.  . Vertigo 10-18-11    inner ear issues occ. -tx. Bonine as needed  . Arthritis 10-18-11    back, fingers  . H/O  hiatal hernia 10-18-11    noted on recent CXR  . Adenomatous colon polyp    Past Surgical History  Procedure Laterality Date  . Gastric ulcer  10-18-11    '91-blleding ulcer with blood transfusions after  . Appendectomy  10-18-11    age 5  . Cataract extraction, bilateral  10-18-11    bilateral   . Eye surgery  10-18-11    laser eye surgery s/p cataract bilaterally  . Cardiac catheterization  10-18-11    7-8 yrs ago-mild blockage,no tx. indicated  . Prostatectomy  10/27/2011    Procedure: PROSTATECTOMY SUPRAPUBIC;  Surgeon: Ailene Rud, MD;  Location: [facility];  Service: Urology;  Laterality: N/A;  Placement of suprapubic tube  . Cystoscopy  10/27/2011    Procedure: CYSTOSCOPY FLEXIBLE;  Surgeon: Ailene Rud, MD;  Location: [facility];  Service: Urology;  Laterality: N/A;   Social History:  reports that he quit smoking about 35 years ago. His smoking use included Cigarettes. He has a 5 pack-year smoking history. His smokeless tobacco use includes Chew. He reports that he does not drink alcohol or use illicit drugs.  Allergies  Allergen Reactions  . Other Other (See Comments)    Pain med-causes hallucinations (unknown drug name)    Family History  Problem Relation Age of Onset  . Breast cancer Sister   . Dementia Mother   . Heart attack  Father   . Colon cancer Neg Hx      Prior to Admission medications   Medication Sig Start Date End Date Taking? Authorizing Provider  amLODipine (NORVASC) 5 MG tablet Take 1 tablet (5 mg total) by mouth daily. 07/08/12   Shanker Kristeen Mans, MD  dextrose 5 % SOLN 50 mL with cefTRIAXone 2 G SOLR 2 g, Intravenous, for 30 Minutes, Every 24 hours, For 6 weeks from  07/05/12 07/08/12   Jonetta Osgood, MD  HYDROcodone-acetaminophen (NORCO/VICODIN) 5-325 MG per tablet Take 1-2 tablets by mouth every 6 (six) hours as needed. 07/08/12   Shanker Kristeen Mans, MD  loratadine (CLARITIN) 10 MG tablet Take 1 tablet (10 mg total) by mouth daily. 07/30/12   Thayer Headings, MD  polyethylene glycol [facility] / Floria Raveling) packet Take 17 g by mouth daily. 07/08/12   Jonetta Osgood, MD   Physical Exam: Filed Vitals:   05/14/14 2215 05/14/14 2230 05/14/14 2244 05/14/14 2245  BP: 115/57 125/73  142/80  Pulse: 145   94  Temp:   101 F (38.3 C)   TempSrc:   Oral   Resp: 18 18  16   SpO2: 92%   93%    Wt Readings from Last 3 Encounters:  11/13/12 98.884 kg (218 lb)  11/05/12 100.245 kg (221 lb)  08/29/12 97.523 kg (215 lb)    General:  Appears calm and comfortable Eyes: PERRL, normal lids, irises & conjunctiva ENT: grossly normal hearing, lips & tongue Neck: no LAD, masses or thyromegaly Cardiovascular: RRR, no m/r/g. No LE edema. Telemetry: SR, no arrhythmias  Respiratory: left LL crackles noted. Normal respiratory effort. Abdomen: soft, ntnd Skin: no rash or induration seen on limited exam Musculoskeletal: grossly normal tone BUE/BLE Psychiatric: grossly normal mood and affect Neurologic: grossly non-focal.          Labs on Admission:  Basic Metabolic Panel:  Recent Labs Lab 05/14/14 2041  NA 137  K 4.4  CL 99  CO2 27  GLUCOSE 200*  BUN 23  CREATININE 1.08  CALCIUM 8.7   Liver Function Tests:  Recent Labs Lab 05/14/14 2110  AST 45*  ALT 41  ALKPHOS 92  BILITOT 1.0  PROT 6.3  ALBUMIN 2.3*   No results for input(s): LIPASE, AMYLASE in the last 168 hours. No results for input(s): AMMONIA in the last 168 hours. CBC:  Recent Labs Lab 05/14/14 2041  WBC 15.0*  NEUTROABS 12.8*  HGB 14.4  HCT 43.4  MCV 92.9  PLT 438*   Cardiac Enzymes: No results for input(s): CKTOTAL, CKMB, CKMBINDEX, TROPONINI in the last 168 hours.  BNP (last 3 results) No results for input(s): PROBNP in the last 8760 hours. CBG: No results for input(s): GLUCAP in the last 168 hours.  Radiological Exams on Admission: Dg Chest Port 1 View  05/14/2014   CLINICAL DATA:  Joint pain  EXAM: PORTABLE CHEST - 1 VIEW  COMPARISON:  07/30/2013   FINDINGS: Hypoaeration which accentuates chronic elevation of the right diaphragm and exaggerates cardiac size - which is borderline enlarged. Aortic and hilar contours are stable from prior. There is no edema, consolidation, effusion, or pneumothorax.  IMPRESSION: Negative low volume chest.   Electronically Signed   By: Jorje Guild M.D.   On: 05/14/2014 21:20      Assessment/Plan Active Problems:   GERD (gastroesophageal reflux disease)   UTI (lower urinary tract infection)   HTN (hypertension)   Polyarthritis   1. UTI -patient started on antibiotics  in the ED -will continue with Vancocin and Zosyn -cultures drawn will follow up -keep hydrated -he has a history of stones in the past may need urology follow up  2. Hypertension -Under control at this time -will monitor pressures  3. GERD -will continue with antacids  4. Polyarthritis -unclear etiology -will check ANA ANCA sed Rate RF  5. Elevated Glucose -not known to be a diabetic -will monitor FSBS -Insulin as needed    Code Status: Full Code (must indicate code status--if unknown or must be presumed, indicate so) DVT Prophylaxis:Heparin Family Communication: daughter (indicate person spoken with, if applicable, with phone number if by telephone) Disposition Plan: Home (indicate anticipated LOS)  Time spent: 22min  KHAN,SAADAT A [facility] Pager [phone]

## 2014-05-14 NOTE — ED Notes (Signed)
Pt c/o pain in all his joints for a month. Pt states he wanted to be seen before the ice store hits. Pt denies n/v/d, fevers, headaches, constipation. Pt still works as a Dealer daily

## 2014-05-15 DIAGNOSIS — Z9841 Cataract extraction status, right eye: Secondary | ICD-10-CM | POA: Diagnosis not present

## 2014-05-15 DIAGNOSIS — R52 Pain, unspecified: Secondary | ICD-10-CM | POA: Diagnosis present

## 2014-05-15 DIAGNOSIS — Z885 Allergy status to narcotic agent status: Secondary | ICD-10-CM | POA: Diagnosis not present

## 2014-05-15 DIAGNOSIS — H409 Unspecified glaucoma: Secondary | ICD-10-CM | POA: Diagnosis present

## 2014-05-15 DIAGNOSIS — N390 Urinary tract infection, site not specified: Secondary | ICD-10-CM | POA: Diagnosis present

## 2014-05-15 DIAGNOSIS — Z9079 Acquired absence of other genital organ(s): Secondary | ICD-10-CM | POA: Diagnosis present

## 2014-05-15 DIAGNOSIS — I10 Essential (primary) hypertension: Secondary | ICD-10-CM | POA: Diagnosis present

## 2014-05-15 DIAGNOSIS — K219 Gastro-esophageal reflux disease without esophagitis: Secondary | ICD-10-CM | POA: Diagnosis present

## 2014-05-15 DIAGNOSIS — M1990 Unspecified osteoarthritis, unspecified site: Secondary | ICD-10-CM | POA: Diagnosis present

## 2014-05-15 DIAGNOSIS — Z9842 Cataract extraction status, left eye: Secondary | ICD-10-CM | POA: Diagnosis not present

## 2014-05-15 DIAGNOSIS — Z87891 Personal history of nicotine dependence: Secondary | ICD-10-CM | POA: Diagnosis not present

## 2014-05-15 DIAGNOSIS — Z86010 Personal history of colonic polyps: Secondary | ICD-10-CM | POA: Diagnosis not present

## 2014-05-15 LAB — SEDIMENTATION RATE: SED RATE: 60 mm/h — AB (ref 0–16)

## 2014-05-15 LAB — URINE CULTURE: Colony Count: 7000

## 2014-05-15 LAB — CBC
HCT: 38.1 % — ABNORMAL LOW (ref 39.0–52.0)
HEMOGLOBIN: 12.2 g/dL — AB (ref 13.0–17.0)
MCH: 29.9 pg (ref 26.0–34.0)
MCHC: 32 g/dL (ref 30.0–36.0)
MCV: 93.4 fL (ref 78.0–100.0)
PLATELETS: 321 10*3/uL (ref 150–400)
RBC: 4.08 MIL/uL — ABNORMAL LOW (ref 4.22–5.81)
RDW: 12.8 % (ref 11.5–15.5)
WBC: 10.5 10*3/uL (ref 4.0–10.5)

## 2014-05-15 LAB — TSH: TSH: 0.948 u[IU]/mL (ref 0.350–4.500)

## 2014-05-15 LAB — GLUCOSE, CAPILLARY
GLUCOSE-CAPILLARY: 167 mg/dL — AB (ref 70–99)
Glucose-Capillary: 159 mg/dL — ABNORMAL HIGH (ref 70–99)
Glucose-Capillary: 138 mg/dL — ABNORMAL HIGH (ref 70–99)

## 2014-05-15 LAB — MRSA PCR SCREENING: MRSA by PCR: NEGATIVE

## 2014-05-15 LAB — HEMOGLOBIN A1C
Hgb A1c MFr Bld: 6.5 % — ABNORMAL HIGH (ref <5.7)
MEAN PLASMA GLUCOSE: 140 mg/dL — AB (ref <117)

## 2014-05-15 LAB — TROPONIN I
Troponin I: <0.03 ng/mL (ref <0.031)
Troponin I: <0.03 ng/mL (ref <0.031)

## 2014-05-15 MED ORDER — FOLIC ACID 1 MG PO TABS
1.0000 mg | ORAL_TABLET | Freq: Every day | ORAL | Status: DC
  Administered 2014-05-15: 1 mg via ORAL
  Filled 2014-05-15: qty 1

## 2014-05-15 MED ORDER — ONDANSETRON HCL 4 MG/2ML IJ SOLN: 4.0000 mg | Freq: Four times a day (QID) | INTRAMUSCULAR | Status: DC | PRN

## 2014-05-15 MED ORDER — ACETAMINOPHEN 325 MG PO TABS: 650.0000 mg | ORAL_TABLET | Freq: Four times a day (QID) | ORAL | Status: DC | PRN

## 2014-05-15 MED ORDER — HEPARIN SODIUM (PORCINE) 5000 UNIT/ML IJ SOLN
5000.0000 [IU] | Freq: Three times a day (TID) | INTRAMUSCULAR | Status: DC
  Administered 2014-05-15: 5000 [IU] via SUBCUTANEOUS
  Filled 2014-05-15 (×4): qty 1

## 2014-05-15 MED ORDER — ONDANSETRON HCL 4 MG PO TABS: 4.0000 mg | ORAL_TABLET | Freq: Four times a day (QID) | ORAL | Status: DC | PRN

## 2014-05-15 MED ORDER — SODIUM CHLORIDE 0.9 % IJ SOLN
3.0000 mL | Freq: Two times a day (BID) | INTRAMUSCULAR | Status: DC
  Administered 2014-05-15: 3 mL via INTRAVENOUS

## 2014-05-15 MED ORDER — VITAMIN B-1 100 MG PO TABS
100.0000 mg | ORAL_TABLET | Freq: Every day | ORAL | Status: DC
  Administered 2014-05-15: 100 mg via ORAL
  Filled 2014-05-15: qty 1

## 2014-05-15 MED ORDER — CIPROFLOXACIN HCL 250 MG PO TABS: 250.0000 mg | ORAL_TABLET | Freq: Two times a day (BID) | ORAL | Status: DC

## 2014-05-15 MED ORDER — METOPROLOL TARTRATE 1 MG/ML IV SOLN
2.5000 mg | Freq: Once | INTRAVENOUS | Status: AC
  Administered 2014-05-15: 2.5 mg via INTRAVENOUS
  Filled 2014-05-15: qty 5

## 2014-05-15 MED ORDER — ALUM & MAG HYDROXIDE-SIMETH 200-200-20 MG/5ML PO SUSP: 30.0000 mL | Freq: Four times a day (QID) | ORAL | Status: DC | PRN

## 2014-05-15 MED ORDER — ACETAMINOPHEN 650 MG RE SUPP: 650.0000 mg | Freq: Four times a day (QID) | RECTAL | Status: DC | PRN

## 2014-05-15 MED ORDER — LORATADINE 10 MG PO TABS
10.0000 mg | ORAL_TABLET | Freq: Every day | ORAL | Status: DC
  Administered 2014-05-15: 10 mg via ORAL
  Filled 2014-05-15: qty 1

## 2014-05-15 MED ORDER — ASPIRIN EC 81 MG PO TBEC
81.0000 mg | DELAYED_RELEASE_TABLET | Freq: Every day | ORAL | Status: DC
  Administered 2014-05-15: 81 mg via ORAL
  Filled 2014-05-15: qty 1

## 2014-05-15 MED ORDER — SODIUM CHLORIDE 0.9 % IV SOLN
INTRAVENOUS | Status: DC
Start: 2014-05-15 — End: 2014-05-15
  Administered 2014-05-15: 01:00:00 via INTRAVENOUS

## 2014-05-15 MED ORDER — ADULT MULTIVITAMIN W/MINERALS CH
1.0000 | ORAL_TABLET | Freq: Every day | ORAL | Status: DC
  Administered 2014-05-15: 1 via ORAL
  Filled 2014-05-15: qty 1

## 2014-05-15 MED ORDER — AMLODIPINE BESYLATE 5 MG PO TABS
5.0000 mg | ORAL_TABLET | Freq: Every day | ORAL | Status: DC
  Administered 2014-05-15: 5 mg via ORAL
  Filled 2014-05-15: qty 1

## 2014-05-15 MED ORDER — POLYETHYLENE GLYCOL 3350 17 G PO PACK
17.0000 g | PACK | Freq: Every day | ORAL | Status: DC
  Administered 2014-05-15: 17 g via ORAL
  Filled 2014-05-15: qty 1

## 2014-05-15 MED ORDER — OXYCODONE HCL 5 MG PO TABS
5.0000 mg | ORAL_TABLET | ORAL | Status: DC | PRN
  Administered 2014-05-15: 5 mg via ORAL
  Filled 2014-05-15: qty 1

## 2014-05-15 MED ORDER — ADULT MULTIVITAMIN W/MINERALS CH: 1.0000 | ORAL_TABLET | Freq: Every day | ORAL | Status: DC

## 2014-05-15 MED ORDER — DOCUSATE SODIUM 100 MG PO CAPS
100.0000 mg | ORAL_CAPSULE | Freq: Two times a day (BID) | ORAL | Status: DC
  Administered 2014-05-15: 100 mg via ORAL
  Filled 2014-05-15 (×2): qty 1

## 2014-05-15 MED ORDER — INSULIN ASPART 100 UNIT/ML ~~LOC~~ SOLN
0.0000 [IU] | Freq: Three times a day (TID) | SUBCUTANEOUS | Status: DC
  Administered 2014-05-15: 3 [IU] via SUBCUTANEOUS
  Administered 2014-05-15: 2 [IU] via SUBCUTANEOUS

## 2014-05-15 NOTE — Discharge Summary (Signed)
Physician Discharge Summary  Craig Waters. SNK:[id number] DOB: 1930/11/18 DOA: 05/14/2014  PCP: Leonard Downing, MD  Admit date: 05/14/2014 Discharge date: 05/15/2014  Time spent: 45 minutes  Recommendations for Outpatient Follow-up:  -Will be discharged home today. -Advised to follow up with PCP in 2 weeks. At that time results of urine cx and final blood cultures should be followed.   Discharge Diagnoses:  Active Problems:   GERD (gastroesophageal reflux disease)   UTI (lower urinary tract infection)   HTN (hypertension)   Polyarthritis   Discharge Condition: Stable and improved  Filed Weights   05/15/14 0700  Weight: 104.327 kg (230 lb)    History of present illness:  Craig Waters. is a 81 y.o. male presents with multiple aches and pains. Patient states that about 3 weeks ago he noted all his joints were aching. He states that today he didn't have enough strength to exercise. He states that he had been noting some urinary problems. He states he has had a stone in the past. He states he has had blood in his urine frequently. He had been to a urologist but his daughter states he will not go back to see him. Patient states he had also noted body aches in the past. He has no cough no congestion. He has no headaches noted. He has had a sore throat. He has no ill contacts. He did not get a flu shot or pneumonia shot.   Hospital Course:   UTI -Will be discharged on cipro. -PCP to follow up on cx data upon hospital follow up.  Weakness, Generalized -Much improved overnight with abx therapy. -Suspect mainly related to acute infectious process.  Joint Pain -Unable to specify specific joints, states "it hurt all over". -much improved today. -Suspect reactive arthritis to UTI.  GERD -Continue antacids.   Procedures:  None   Consultations:  None  Discharge Instructions  Discharge Instructions    Diet - low sodium heart healthy    Complete by:  As  directed      Increase activity slowly    Complete by:  As directed             Medication List    STOP taking these medications        dextrose 5 % SOLN 50 mL with cefTRIAXone 2 G SOLR      TAKE these medications        amLODipine 5 MG tablet  Commonly known as:  NORVASC  Take 1 tablet (5 mg total) by mouth daily.     ciprofloxacin 250 MG tablet  Commonly known as:  CIPRO  Take 1 tablet (250 mg total) by mouth 2 (two) times daily.     HYDROcodone-acetaminophen 5-325 MG per tablet  Commonly known as:  NORCO/VICODIN  Take 1-2 tablets by mouth every 6 (six) hours as needed.     loratadine 10 MG tablet  Commonly known as:  CLARITIN  Take 1 tablet (10 mg total) by mouth daily.     multivitamin with minerals Tabs tablet  Take 1 tablet by mouth daily.     polyethylene glycol packet  Commonly known as:  MIRALAX / GLYCOLAX  Take 17 g by mouth daily.       Allergies  Allergen Reactions  . Other Other (See Comments)    Pain med-causes hallucinations (unknown drug name)       Follow-up Information    Follow up with Leonard Downing, MD. Schedule an appointment as  soon as possible for a visit in 2 weeks.   Specialty:  Family Medicine   Contact information:   [place] [address] [phone]        The results of significant diagnostics from this hospitalization (including imaging, microbiology, ancillary and laboratory) are listed below for reference.    Significant Diagnostic Studies: Dg Chest Port 1 View  05/14/2014   CLINICAL DATA:  Joint pain  EXAM: PORTABLE CHEST - 1 VIEW  COMPARISON:  07/30/2013  FINDINGS: Hypoaeration which accentuates chronic elevation of the right diaphragm and exaggerates cardiac size - which is borderline enlarged. Aortic and hilar contours are stable from prior. There is no edema, consolidation, effusion, or pneumothorax.  IMPRESSION: Negative low volume chest.   Electronically Signed   By: Jorje Guild  M.D.   On: 05/14/2014 21:20    Microbiology: Recent Results (from the past 240 hour(s))  MRSA PCR Screening     Status: None   Collection Time: 05/15/14 12:45 AM  Result Value Ref Range Status   MRSA by PCR NEGATIVE NEGATIVE Final    Comment:        The GeneXpert MRSA Assay (FDA approved for NASAL specimens only), is one component of a comprehensive MRSA colonization surveillance program. It is not intended to diagnose MRSA infection nor to guide or monitor treatment for MRSA infections.      Labs: Basic Metabolic Panel:  Recent Labs Lab 05/14/14 2041  NA 137  K 4.4  CL 99  CO2 27  GLUCOSE 200*  BUN 23  CREATININE 1.08  CALCIUM 8.7   Liver Function Tests:  Recent Labs Lab 05/14/14 2110  AST 45*  ALT 41  ALKPHOS 92  BILITOT 1.0  PROT 6.3  ALBUMIN 2.3*   No results for input(s): LIPASE, AMYLASE in the last 168 hours. No results for input(s): AMMONIA in the last 168 hours. CBC:  Recent Labs Lab 05/14/14 2041 05/15/14 0637  WBC 15.0* 10.5  NEUTROABS 12.8*  --   HGB 14.4 12.2*  HCT 43.4 38.1*  MCV 92.9 93.4  PLT 438* 321   Cardiac Enzymes:  Recent Labs Lab 05/15/14 0138 05/15/14 0637 05/15/14 1222  TROPONINI <0.03 <0.03 <0.03   BNP: BNP (last 3 results) No results for input(s): PROBNP in the last 8760 hours. CBG:  Recent Labs Lab 05/15/14 0518 05/15/14 0836 05/15/14 1153  GLUCAP 159* 138* 167*       Signed:  HERNANDEZ ACOSTA,ESTELA  [facility] Pager: [phone] 05/15/2014, 3:37 PM

## 2014-05-18 LAB — GLOMERULAR BASEMENT MEMBRANE ANTIBODIES: GBM Ab: <1

## 2014-05-18 LAB — ANA: Anti Nuclear Antibody(ANA): NEGATIVE

## 2014-05-19 LAB — ANCA TITERS: Atypical P-ANCA titer: <1:20 {titer}

## 2014-05-21 ENCOUNTER — Telehealth: Payer: Self-pay | Admitting: *Deleted

## 2014-05-21 LAB — RHEUMATOID FACTOR: Rhuematoid fact SerPl-aCnc: 10 IU/mL (ref 0–20)

## 2014-05-21 LAB — CULTURE, BLOOD (ROUTINE X 2)
CULTURE: NO GROWTH
Culture: NO GROWTH

## 2014-05-21 NOTE — Telephone Encounter (Signed)
Dr. Arelia Sneddon left message in [facility] Triage asking for ID consult/evaluation on this patient.  Patient was previously treated by ID for staph in 2014.  Patient has been experiencing symptoms for the last 2-3 weeks that seem to indicate, per Dr. Arelia Sneddon, that the infection may have returned.  Patient has fever, reports sore joints and was hospitalized at [facility] last week for possible bacteremia.  ESR = 60, CRP = 172.  Please contact Dr. Arelia Sneddon at [phone] with advice. Landis Gandy, RN

## 2014-05-22 ENCOUNTER — Other Ambulatory Visit: Payer: Self-pay | Admitting: Family Medicine

## 2014-05-22 DIAGNOSIS — M5489 Other dorsalgia: Secondary | ICD-10-CM

## 2014-05-22 LAB — ANTI-DNASE B ANTIBODY: Anti-DNAse-B: <95 U/mL (ref <301)

## 2014-06-23 HISTORY — PX: TRANSURETHRAL RESECTION OF BLADDER TUMOR WITH GYRUS (TURBT-GYRUS): SHX6458

## 2014-07-22 ENCOUNTER — Other Ambulatory Visit: Payer: Self-pay | Admitting: Rheumatology

## 2014-07-22 DIAGNOSIS — M21372 Foot drop, left foot: Secondary | ICD-10-CM

## 2014-07-22 DIAGNOSIS — R29898 Other symptoms and signs involving the musculoskeletal system: Secondary | ICD-10-CM

## 2014-07-24 ENCOUNTER — Other Ambulatory Visit: Payer: Self-pay | Admitting: Rheumatology

## 2014-07-24 ENCOUNTER — Ambulatory Visit
Admission: RE | Admit: 2014-07-24 | Discharge: 2014-07-24 | Disposition: A | Discharge status: Home / Self Care | Payer: Medicare Other | Source: Ambulatory Visit | Attending: Rheumatology | Admitting: Rheumatology

## 2014-07-24 DIAGNOSIS — R29898 Other symptoms and signs involving the musculoskeletal system: Secondary | ICD-10-CM

## 2014-07-24 DIAGNOSIS — M21372 Foot drop, left foot: Secondary | ICD-10-CM

## 2014-07-24 DIAGNOSIS — T1590XA Foreign body on external eye, part unspecified, unspecified eye, initial encounter: Secondary | ICD-10-CM

## 2014-07-28 ENCOUNTER — Ambulatory Visit
Admission: RE | Admit: 2014-07-28 | Discharge: 2014-07-28 | Disposition: A | Discharge status: Home / Self Care | Payer: Medicare Other | Source: Ambulatory Visit | Attending: Rheumatology | Admitting: Rheumatology

## 2014-07-28 DIAGNOSIS — T1590XA Foreign body on external eye, part unspecified, unspecified eye, initial encounter: Secondary | ICD-10-CM

## 2014-07-29 ENCOUNTER — Ambulatory Visit
Admission: RE | Admit: 2014-07-29 | Discharge: 2014-07-29 | Disposition: A | Discharge status: Home / Self Care | Payer: Medicare Other | Source: Ambulatory Visit | Attending: Rheumatology | Admitting: Rheumatology

## 2014-07-31 ENCOUNTER — Other Ambulatory Visit: Payer: Self-pay | Admitting: Rheumatology

## 2014-07-31 DIAGNOSIS — M545 Low back pain, unspecified: Secondary | ICD-10-CM

## 2014-08-03 ENCOUNTER — Ambulatory Visit
Admission: RE | Admit: 2014-08-03 | Discharge: 2014-08-03 | Disposition: A | Discharge status: Home / Self Care | Payer: Medicare Other | Source: Ambulatory Visit | Attending: Rheumatology | Admitting: Rheumatology

## 2014-08-03 DIAGNOSIS — M545 Low back pain, unspecified: Secondary | ICD-10-CM

## 2014-08-03 NOTE — Discharge Instructions (Signed)

## 2014-11-23 ENCOUNTER — Telehealth: Payer: Self-pay | Admitting: Internal Medicine

## 2014-11-23 NOTE — Telephone Encounter (Signed)
I spoke with the patient he will keep the follow up for 12/18/14.  He is having constipation.  He has not been taking Miralax daily .  He is advised to increase the Miralax to daily and if  Needed can take BID.  He will call back for any additional questions or concerns

## 2014-11-27 ENCOUNTER — Encounter: Payer: Self-pay | Admitting: Nurse Practitioner

## 2014-11-27 ENCOUNTER — Ambulatory Visit (INDEPENDENT_AMBULATORY_CARE_PROVIDER_SITE_OTHER): Payer: Medicare Other | Admitting: Nurse Practitioner

## 2014-11-27 VITALS — BP 138/88 | HR 84 | Ht 70.0 in | Wt 205.0 lb

## 2014-11-27 DIAGNOSIS — K5909 Other constipation: Secondary | ICD-10-CM

## 2014-11-27 MED ORDER — POLYETHYLENE GLYCOL 3350 17 GM/SCOOP PO POWD: 1.0000 | Freq: Every day | ORAL | Status: DC

## 2014-11-27 NOTE — Patient Instructions (Signed)
Take Miralax daily. Can take twice daily if needed, Take Citrucel daily. You can mix a scoop of this with water. You can get this at [facility] or any pharmacy. Take Dulcolax as needed. You can get Glycerin suppositories as needed. You can get these also at [facility], or your pharmacy.

## 2014-11-27 NOTE — Progress Notes (Signed)
     History of Present Illness:  Last colonoscopy February 2014 with findings of moderate diverticulosis, small internal hemorrhoids. 2 small sessile polyps were removed from the transverse and sigmoid colon. Polyps were adenomatous.   Patient called the office a few days ago with constipation, he has a history of the same. Patient had not been taking his MiraLAX every day. He was advised to take MiraLAX daily and in fact increased to twice daily  Since increasing MiraLAX patient's bowel movements have improved. He was on hydrocodone in December and just could not get back on track with his bowel movements since then. Now off narcotics. But he does take calcium as well as diltiazem which can be constipating. Patient has not been taking fiber on a daily basis, he was not taking MiraLAX on a daily basis either. Patient was concerned he had a bowel obstruction, thinks he needs another colonoscopy.    Current Medications, Allergies, Past Medical History, Past Surgical History, Family History and Social History were reviewed in [employer] record.  Physical Exam: General: Pleasant, well developed , white male in no acute distress Head: Normocephalic and atraumatic Eyes:  sclerae anicteric, conjunctiva pink  Ears: Normal auditory acuity Lungs: Clear throughout to auscultation Heart: Regular rate and rhythm Abdomen: Soft, non distended, non-tender. No masses, no hepatomegaly. Normal bowel sounds Musculoskeletal: Symmetrical with no gross deformities  Extremities: No edema  Neurological: Alert oriented x 4, grossly nonfocal Psychological:  Alert and cooperative. Normal mood and affect  Assessment and Recommendations:   Pleasant 81 year old male with constipation. He was on narcotics back in December and bowel movements got off track. He is also on calcium and diltiazem which can be constipating. After talking to her office a few days ago and increasing MiraLAX to twice a  day his bowel movements are better. Patient was not taking MiraLAX nor fiber on a daily basis. He also complains of some difficulty evacuating stools.  Patient does not need another colonoscopy at this time. He may be done with surveillance colonoscopies altogether but will defer that to Dr. Carlean Purl his primary gastroenterologist.   I advised daily Citrucel and daily MiraLAX. Patient can increase MiraLAX to twice daily if he needs it.   He can take an occasional Dulcolax if needed.   Stool softeners haven't helped so those can be discontinued.   He should elevate legs with a stool when trying to defecate, this can facilitate evacuation. If this does not work I advised patient to insert a glycerin suppository.   Call for further problems

## 2014-12-12 NOTE — Progress Notes (Signed)
Agree with Ms. Guenther's assessment and plan. Carl E. Gessner, MD, FACG   

## 2014-12-18 ENCOUNTER — Ambulatory Visit: Payer: Medicare Other | Admitting: Internal Medicine

## 2015-02-11 ENCOUNTER — Other Ambulatory Visit: Payer: Self-pay | Admitting: Geriatric Medicine

## 2015-02-11 DIAGNOSIS — N19 Unspecified kidney failure: Secondary | ICD-10-CM

## 2015-02-12 ENCOUNTER — Ambulatory Visit
Admission: RE | Admit: 2015-02-12 | Discharge: 2015-02-12 | Disposition: A | Discharge status: Home / Self Care | Payer: Medicare Other | Source: Ambulatory Visit | Attending: Geriatric Medicine | Admitting: Geriatric Medicine

## 2015-02-12 DIAGNOSIS — N19 Unspecified kidney failure: Secondary | ICD-10-CM

## 2015-02-15 ENCOUNTER — Inpatient Hospital Stay (HOSPITAL_COMMUNITY)
Admission: EM | Admit: 2015-02-15 | Discharge: 2015-02-22 | DRG: 682 | Disposition: A | Discharge status: Home / Self Care | Payer: Medicare Other | Attending: Internal Medicine | Admitting: Internal Medicine

## 2015-02-15 ENCOUNTER — Encounter (HOSPITAL_COMMUNITY): Payer: Self-pay

## 2015-02-15 ENCOUNTER — Emergency Department (HOSPITAL_COMMUNITY): Payer: Medicare Other

## 2015-02-15 DIAGNOSIS — C679 Malignant neoplasm of bladder, unspecified: Secondary | ICD-10-CM | POA: Diagnosis present

## 2015-02-15 DIAGNOSIS — I10 Essential (primary) hypertension: Secondary | ICD-10-CM | POA: Diagnosis not present

## 2015-02-15 DIAGNOSIS — Z8249 Family history of ischemic heart disease and other diseases of the circulatory system: Secondary | ICD-10-CM | POA: Diagnosis not present

## 2015-02-15 DIAGNOSIS — E872 Acidosis: Secondary | ICD-10-CM | POA: Diagnosis present

## 2015-02-15 DIAGNOSIS — Z8711 Personal history of peptic ulcer disease: Secondary | ICD-10-CM

## 2015-02-15 DIAGNOSIS — K219 Gastro-esophageal reflux disease without esophagitis: Secondary | ICD-10-CM | POA: Diagnosis present

## 2015-02-15 DIAGNOSIS — N200 Calculus of kidney: Secondary | ICD-10-CM

## 2015-02-15 DIAGNOSIS — D696 Thrombocytopenia, unspecified: Secondary | ICD-10-CM | POA: Diagnosis present

## 2015-02-15 DIAGNOSIS — R627 Adult failure to thrive: Secondary | ICD-10-CM | POA: Diagnosis present

## 2015-02-15 DIAGNOSIS — R590 Localized enlarged lymph nodes: Secondary | ICD-10-CM | POA: Diagnosis present

## 2015-02-15 DIAGNOSIS — E119 Type 2 diabetes mellitus without complications: Secondary | ICD-10-CM | POA: Diagnosis present

## 2015-02-15 DIAGNOSIS — N1330 Unspecified hydronephrosis: Secondary | ICD-10-CM

## 2015-02-15 DIAGNOSIS — G9349 Other encephalopathy: Secondary | ICD-10-CM | POA: Diagnosis present

## 2015-02-15 DIAGNOSIS — K5660 Unspecified intestinal obstruction: Secondary | ICD-10-CM | POA: Diagnosis present

## 2015-02-15 DIAGNOSIS — C689 Malignant neoplasm of urinary organ, unspecified: Secondary | ICD-10-CM | POA: Diagnosis present

## 2015-02-15 DIAGNOSIS — K567 Ileus, unspecified: Secondary | ICD-10-CM | POA: Diagnosis present

## 2015-02-15 DIAGNOSIS — N179 Acute kidney failure, unspecified: Secondary | ICD-10-CM | POA: Diagnosis present

## 2015-02-15 DIAGNOSIS — R451 Restlessness and agitation: Secondary | ICD-10-CM | POA: Diagnosis present

## 2015-02-15 DIAGNOSIS — I4891 Unspecified atrial fibrillation: Secondary | ICD-10-CM | POA: Diagnosis not present

## 2015-02-15 DIAGNOSIS — E875 Hyperkalemia: Secondary | ICD-10-CM | POA: Diagnosis present

## 2015-02-15 DIAGNOSIS — Z7952 Long term (current) use of systemic steroids: Secondary | ICD-10-CM | POA: Diagnosis not present

## 2015-02-15 DIAGNOSIS — H409 Unspecified glaucoma: Secondary | ICD-10-CM | POA: Diagnosis present

## 2015-02-15 DIAGNOSIS — N132 Hydronephrosis with renal and ureteral calculous obstruction: Secondary | ICD-10-CM | POA: Diagnosis present

## 2015-02-15 DIAGNOSIS — R531 Weakness: Secondary | ICD-10-CM | POA: Diagnosis present

## 2015-02-15 DIAGNOSIS — Z79899 Other long term (current) drug therapy: Secondary | ICD-10-CM | POA: Diagnosis not present

## 2015-02-15 DIAGNOSIS — D72829 Elevated white blood cell count, unspecified: Secondary | ICD-10-CM | POA: Diagnosis present

## 2015-02-15 DIAGNOSIS — R2681 Unsteadiness on feet: Secondary | ICD-10-CM | POA: Diagnosis present

## 2015-02-15 DIAGNOSIS — Z87891 Personal history of nicotine dependence: Secondary | ICD-10-CM | POA: Diagnosis not present

## 2015-02-15 DIAGNOSIS — M069 Rheumatoid arthritis, unspecified: Secondary | ICD-10-CM | POA: Diagnosis present

## 2015-02-15 DIAGNOSIS — K5900 Constipation, unspecified: Secondary | ICD-10-CM

## 2015-02-15 DIAGNOSIS — N19 Unspecified kidney failure: Secondary | ICD-10-CM

## 2015-02-15 DIAGNOSIS — E876 Hypokalemia: Secondary | ICD-10-CM | POA: Diagnosis present

## 2015-02-15 DIAGNOSIS — E8770 Fluid overload, unspecified: Secondary | ICD-10-CM | POA: Diagnosis present

## 2015-02-15 DIAGNOSIS — I480 Paroxysmal atrial fibrillation: Secondary | ICD-10-CM | POA: Diagnosis present

## 2015-02-15 DIAGNOSIS — Z803 Family history of malignant neoplasm of breast: Secondary | ICD-10-CM | POA: Diagnosis not present

## 2015-02-15 DIAGNOSIS — N139 Obstructive and reflux uropathy, unspecified: Secondary | ICD-10-CM | POA: Diagnosis not present

## 2015-02-15 DIAGNOSIS — G9340 Encephalopathy, unspecified: Secondary | ICD-10-CM | POA: Diagnosis not present

## 2015-02-15 HISTORY — DX: Paroxysmal atrial fibrillation: I48.0

## 2015-02-15 LAB — URINALYSIS, ROUTINE W REFLEX MICROSCOPIC
Bilirubin Urine: NEGATIVE
Glucose, UA: 100 mg/dL — AB
Ketones, ur: NEGATIVE mg/dL
Nitrite: NEGATIVE
Protein, ur: 100 mg/dL — AB
Specific Gravity, Urine: 1.011 (ref 1.005–1.030)
Urobilinogen, UA: 0.2 mg/dL (ref 0.0–1.0)
pH: 7.5 (ref 5.0–8.0)

## 2015-02-15 LAB — CBC WITH DIFFERENTIAL/PLATELET
Basophils Absolute: 0 10*3/uL (ref 0.0–0.1)
Basophils Relative: 0 %
Eosinophils Absolute: 0.1 10*3/uL (ref 0.0–0.7)
Eosinophils Relative: 0 %
HCT: 43.7 % (ref 39.0–52.0)
Hemoglobin: 14.7 g/dL (ref 13.0–17.0)
Lymphocytes Relative: 7 %
Lymphs Abs: 1.3 10*3/uL (ref 0.7–4.0)
MCH: 30.4 pg (ref 26.0–34.0)
MCHC: 33.6 g/dL (ref 30.0–36.0)
MCV: 90.5 fL (ref 78.0–100.0)
Monocytes Absolute: 1.2 10*3/uL — ABNORMAL HIGH (ref 0.1–1.0)
Monocytes Relative: 7 %
Neutro Abs: 15.8 10*3/uL — ABNORMAL HIGH (ref 1.7–7.7)
Neutrophils Relative %: 86 %
Platelets: 133 10*3/uL — ABNORMAL LOW (ref 150–400)
RBC: 4.83 MIL/uL (ref 4.22–5.81)
RDW: 14.9 % (ref 11.5–15.5)
WBC: 18.4 10*3/uL — ABNORMAL HIGH (ref 4.0–10.5)

## 2015-02-15 LAB — BASIC METABOLIC PANEL
Anion gap: 16 — ABNORMAL HIGH (ref 5–15)
Anion gap: 15 (ref 5–15)
Anion gap: 15 (ref 5–15)
BUN: 102 mg/dL — ABNORMAL HIGH (ref 6–20)
BUN: 125 mg/dL — ABNORMAL HIGH (ref 6–20)
BUN: 128 mg/dL — ABNORMAL HIGH (ref 6–20)
CHLORIDE: 101 mmol/L (ref 101–111)
CO2: 17 mmol/L — ABNORMAL LOW (ref 22–32)
CO2: 22 mmol/L (ref 22–32)
CO2: 22 mmol/L (ref 22–32)
Calcium: 9 mg/dL (ref 8.9–10.3)
Calcium: 9.4 mg/dL (ref 8.9–10.3)
Calcium: 9.3 mg/dL (ref 8.9–10.3)
Chloride: 104 mmol/L (ref 101–111)
Chloride: 101 mmol/L (ref 101–111)
Creatinine, Ser: 12.06 mg/dL — ABNORMAL HIGH (ref 0.61–1.24)
Creatinine, Ser: 12.97 mg/dL — ABNORMAL HIGH (ref 0.61–1.24)
Creatinine, Ser: 13 mg/dL — ABNORMAL HIGH (ref 0.61–1.24)
GFR calc Af Amer: 4 mL/min — ABNORMAL LOW (ref >60)
GFR calc Af Amer: 4 mL/min — ABNORMAL LOW (ref >60)
GFR calc non Af Amer: 3 mL/min — ABNORMAL LOW (ref >60)
GFR calc non Af Amer: 3 mL/min — ABNORMAL LOW (ref >60)
GFR calc non Af Amer: 3 mL/min — ABNORMAL LOW (ref >60)
GFR, EST AFRICAN AMERICAN: 4 mL/min — AB (ref >60)
Glucose, Bld: 138 mg/dL — ABNORMAL HIGH (ref 65–99)
Glucose, Bld: 141 mg/dL — ABNORMAL HIGH (ref 65–99)
Glucose, Bld: 167 mg/dL — ABNORMAL HIGH (ref 65–99)
Potassium: >7.5 mmol/L (ref 3.5–5.1)
Potassium: >7.5 mmol/L (ref 3.5–5.1)
SODIUM: 138 mmol/L (ref 135–145)
Sodium: 137 mmol/L (ref 135–145)
Sodium: 138 mmol/L (ref 135–145)

## 2015-02-15 LAB — MRSA PCR SCREENING: MRSA by PCR: NEGATIVE

## 2015-02-15 LAB — BRAIN NATRIURETIC PEPTIDE: B Natriuretic Peptide: 522.9 pg/mL — ABNORMAL HIGH (ref 0.0–100.0)

## 2015-02-15 LAB — URINE MICROSCOPIC-ADD ON

## 2015-02-15 LAB — TROPONIN I: Troponin I: 0.12 ng/mL — ABNORMAL HIGH (ref <0.031)

## 2015-02-15 MED ORDER — INSULIN ASPART 100 UNIT/ML ~~LOC~~ SOLN
10.0000 [IU] | Freq: Once | SUBCUTANEOUS | Status: AC
  Administered 2015-02-15: 10 [IU] via INTRAVENOUS
  Filled 2015-02-15 (×2): qty 0.1

## 2015-02-15 MED ORDER — ONDANSETRON HCL 4 MG PO TABS: 4.0000 mg | ORAL_TABLET | Freq: Four times a day (QID) | ORAL | Status: DC | PRN

## 2015-02-15 MED ORDER — ONDANSETRON HCL 4 MG/2ML IJ SOLN
4.0000 mg | Freq: Once | INTRAMUSCULAR | Status: AC
  Administered 2015-02-15: 4 mg via INTRAVENOUS
  Filled 2015-02-15: qty 2

## 2015-02-15 MED ORDER — DEXTROSE 50 % IV SOLN
1.0000 | Freq: Once | INTRAVENOUS | Status: AC
  Administered 2015-02-15: 50 mL via INTRAVENOUS
  Filled 2015-02-15: qty 50

## 2015-02-15 MED ORDER — HEPARIN SODIUM (PORCINE) 5000 UNIT/ML IJ SOLN
5000.0000 [IU] | Freq: Three times a day (TID) | INTRAMUSCULAR | Status: DC
  Administered 2015-02-15: 5000 [IU] via SUBCUTANEOUS
  Filled 2015-02-15 (×2): qty 1

## 2015-02-15 MED ORDER — DEXTROSE 5 % IV SOLN
INTRAVENOUS | Status: DC
  Administered 2015-02-15: 19:00:00 via INTRAVENOUS

## 2015-02-15 MED ORDER — CALCIUM CHLORIDE 10 % IV SOLN: 1.0000 g | Freq: Once | INTRAVENOUS | Status: DC

## 2015-02-15 MED ORDER — SODIUM CHLORIDE 0.9 % IV SOLN
1.0000 g | Freq: Once | INTRAVENOUS | Status: AC
  Administered 2015-02-15: 1 g via INTRAVENOUS
  Filled 2015-02-15: qty 10

## 2015-02-15 MED ORDER — SODIUM POLYSTYRENE SULFONATE 15 GM/60ML PO SUSP
60.0000 g | Freq: Once | ORAL | Status: AC
  Administered 2015-02-15: 60 g via ORAL
  Filled 2015-02-15: qty 240

## 2015-02-15 MED ORDER — ONDANSETRON HCL 4 MG/2ML IJ SOLN
4.0000 mg | Freq: Four times a day (QID) | INTRAMUSCULAR | Status: DC | PRN
  Administered 2015-02-16 (×2): 4 mg via INTRAVENOUS
  Filled 2015-02-15 (×2): qty 2

## 2015-02-15 MED ORDER — HYDRALAZINE HCL 20 MG/ML IJ SOLN
10.0000 mg | Freq: Once | INTRAMUSCULAR | Status: AC
  Administered 2015-02-15: 10 mg via INTRAVENOUS
  Filled 2015-02-15: qty 1

## 2015-02-15 MED ORDER — MORPHINE SULFATE (PF) 2 MG/ML IV SOLN
2.0000 mg | INTRAVENOUS | Status: DC | PRN
  Administered 2015-02-16 – 2015-02-19 (×10): 2 mg via INTRAVENOUS
  Filled 2015-02-15 (×12): qty 1

## 2015-02-15 MED ORDER — FENTANYL CITRATE (PF) 100 MCG/2ML IJ SOLN
50.0000 ug | Freq: Once | INTRAMUSCULAR | Status: AC
  Administered 2015-02-15: 50 ug via INTRAVENOUS
  Filled 2015-02-15: qty 2

## 2015-02-15 MED ORDER — SODIUM CHLORIDE 0.9 % IJ SOLN: 3.0000 mL | INTRAMUSCULAR | Status: DC | PRN

## 2015-02-15 MED ORDER — SODIUM CHLORIDE 0.9 % IJ SOLN
3.0000 mL | Freq: Two times a day (BID) | INTRAMUSCULAR | Status: DC
  Administered 2015-02-15 – 2015-02-22 (×8): 3 mL via INTRAVENOUS

## 2015-02-15 MED ORDER — DEXTROSE 50 % IV SOLN
50.0000 mL | Freq: Once | INTRAVENOUS | Status: AC
  Administered 2015-02-15: 50 mL via INTRAVENOUS
  Filled 2015-02-15: qty 50

## 2015-02-15 MED ORDER — IOHEXOL 300 MG/ML  SOLN
50.0000 mL | Freq: Once | INTRAMUSCULAR | Status: AC | PRN
  Administered 2015-02-15: 50 mL via ORAL

## 2015-02-15 MED ORDER — LORAZEPAM 2 MG/ML IJ SOLN
0.5000 mg | Freq: Once | INTRAMUSCULAR | Status: AC
  Administered 2015-02-15: 0.5 mg via INTRAVENOUS
  Filled 2015-02-15: qty 1

## 2015-02-15 MED ORDER — SODIUM CHLORIDE 0.9 % IV BOLUS (SEPSIS): 1000.0000 mL | Freq: Once | INTRAVENOUS | Status: DC

## 2015-02-15 MED ORDER — SODIUM BICARBONATE 4 % IV SOLN
5.0000 mL | Freq: Once | INTRAVENOUS | Status: AC
  Administered 2015-02-15: 5 mL via INTRAVENOUS
  Filled 2015-02-15: qty 5

## 2015-02-15 MED ORDER — INSULIN ASPART 100 UNIT/ML ~~LOC~~ SOLN
10.0000 [IU] | Freq: Once | SUBCUTANEOUS | Status: AC
  Administered 2015-02-15: 10 [IU] via INTRAVENOUS
  Filled 2015-02-15: qty 1

## 2015-02-15 MED ORDER — SODIUM CHLORIDE 0.9 % IV SOLN: 250.0000 mL | INTRAVENOUS | Status: DC | PRN

## 2015-02-15 MED ORDER — SODIUM BICARBONATE 8.4 % IV SOLN
50.0000 meq | Freq: Once | INTRAVENOUS | Status: AC
  Administered 2015-02-15: 50 meq via INTRAVENOUS
  Filled 2015-02-15: qty 50

## 2015-02-15 MED ORDER — DEXTROSE 5 % IV SOLN
INTRAVENOUS | Status: DC
  Filled 2015-02-15: qty 1000

## 2015-02-15 MED ORDER — SODIUM CHLORIDE 0.9 % IJ SOLN
3.0000 mL | Freq: Two times a day (BID) | INTRAMUSCULAR | Status: DC
  Administered 2015-02-15 – 2015-02-16 (×2): 3 mL via INTRAVENOUS
  Administered 2015-02-18: 08:00:00 via INTRAVENOUS
  Administered 2015-02-19 – 2015-02-22 (×3): 3 mL via INTRAVENOUS

## 2015-02-15 NOTE — ED Notes (Signed)
Delay in lab draw MD in room

## 2015-02-15 NOTE — ED Notes (Signed)
Pt requesting labs be drawn from IV site

## 2015-02-15 NOTE — ED Notes (Signed)
Per EMS- Patient's family reports intermittent SOB and fever x 3 weeks.

## 2015-02-15 NOTE — ED Notes (Signed)
Pt arrived on 2L of O2, EMS stated it was mainly for comfort. When obtaining arrival VS, pt O2 fluctuated between 89-93. Placed pt on 2L per RN.

## 2015-02-15 NOTE — ED Provider Notes (Signed)
CSN: [id number]     Arrival date & time 02/15/15  0935 History   First MD Initiated Contact with Patient 02/15/15 1013     Chief Complaint  Patient presents with  . Shortness of Breath  . Dizziness  . Bloated     (Consider location/radiation/quality/duration/timing/severity/associated sxs/prior Treatment) HPI   81 year old male with progressively worsening malaise. Worsening over the past few weeks. Increasing abdominal and lower extremity swelling. Some mild shortness of breath which is worse with exertion. Has felt dizzy at times. Functional limitations at baseline. He normally can walk short distances around his house without assistance though. Just recently has had to use a walker. Abdominal pain/bloating. Denies any pain anywhere else. No fevers or chills. No cough. Patient has a past history of bladder cancer being followed by urology at [facility]. Reports decreasing urinary output over the last week.  Past Medical History  Diagnosis Date  . Blood in stool   . Diverticulitis   . GERD (gastroesophageal reflux disease)   . Glaucoma   . Ulcer   . History of transfusion of whole blood   . Prostate disorder   . Shortness of breath 10-18-11    with exertion, cardiac cath done 7-8 yrs ago negative.  . Vertigo 10-18-11    inner ear issues occ. -tx. Bonine as needed  . Arthritis 10-18-11    back, fingers  . H/O hiatal hernia 10-18-11    noted on recent CXR  . Adenomatous colon polyp    Past Surgical History  Procedure Laterality Date  . Gastric ulcer  10-18-11    '91-blleding ulcer with blood transfusions after  . Appendectomy  10-18-11    age 39  . Cataract extraction, bilateral  10-18-11    bilateral   . Eye surgery  10-18-11    laser eye surgery s/p cataract bilaterally  . Cardiac catheterization  10-18-11    7-8 yrs ago-mild blockage,no tx. indicated  . Prostatectomy  10/27/2011    Procedure: PROSTATECTOMY SUPRAPUBIC;  Surgeon: Ailene Rud, MD;  Location: [facility];   Service: Urology;  Laterality: N/A;  Placement of suprapubic tube  . Cystoscopy  10/27/2011    Procedure: CYSTOSCOPY FLEXIBLE;  Surgeon: Ailene Rud, MD;  Location: [facility];  Service: Urology;  Laterality: N/A;   Family History  Problem Relation Age of Onset  . Breast cancer Sister   . Dementia Mother   . Heart attack Father   . Colon cancer Neg Hx    Social History  Substance Use Topics  . Smoking status: Former Smoker -- 1.00 packs/day for 5 years    Types: Cigarettes    Quit date: 09/26/1978  . Smokeless tobacco: Current User    Types: Chew    Last Attempt to Quit: 11/01/2011  . Alcohol Use: No    Review of Systems  All systems reviewed and negative, other than as noted in HPI.   Allergies  Other  Home Medications   Prior to Admission medications   Medication Sig Start Date End Date Taking? Authorizing Provider  acetaminophen (TYLENOL) 650 MG CR tablet Take 1,300 mg by mouth every 6 (six) hours as needed for pain.   Yes Historical Provider, MD  acetaminophen-codeine (TYLENOL #3) 300-30 MG tablet Take 1 tablet by mouth daily as needed for moderate pain.    Yes Historical Provider, MD  b complex vitamins tablet Take 1 tablet by mouth daily.   Yes Historical Provider, MD  Calcium Citrate-Vitamin D (CALCIUM + D PO) Take  1 tablet by mouth daily.   Yes Historical Provider, MD  diltiazem (TIAZAC) 180 MG 24 hr capsule Take 180 mg by mouth daily.   Yes Historical Provider, MD  hydroxychloroquine (PLAQUENIL) 200 MG tablet Take 400 mg by mouth daily.   Yes Historical Provider, MD  hyoscyamine (LEVSIN SL) 0.125 MG SL tablet Take 0.125 mg by mouth every 4 (four) hours as needed (bladder spasms).  02/01/15  Yes Historical Provider, MD  Multiple Vitamin (MULTIVITAMIN WITH MINERALS) TABS tablet Take 1 tablet by mouth daily. 05/15/14  Yes Erline Hau, MD  polyethylene glycol powder (GLYCOLAX/MIRALAX) powder Take 255 g by mouth daily. Patient taking differently: Take 17  g by mouth daily.  11/27/14  Yes Willia Craze, NP  predniSONE (DELTASONE) 5 MG tablet Take 5-12.5 mg by mouth daily with breakfast. 10-18 to 10-31 12.5 mg, 11-1 to  11-14 10 mg, 11-15 to 11-29 7.5 mg, 11-30 to 12-13 5 mg then back to the MD for further directions.   Yes Historical Provider, MD  traMADol (ULTRAM) 50 MG tablet Take 50 mg by mouth every 6 (six) hours as needed for moderate pain.   Yes Historical Provider, MD   BP 173/77 mmHg  Pulse 94  Temp(Src) 97.9 F (36.6 C) (Oral)  Resp 19  SpO2 97% Physical Exam  Constitutional: He appears well-developed and well-nourished.  Laying in bed. Appears uncomfortable, but not distressed.  HENT:  Head: Normocephalic and atraumatic.  Eyes: Conjunctivae are normal. Right eye exhibits no discharge. Left eye exhibits no discharge.  Neck: Neck supple.  Cardiovascular: Normal rate, regular rhythm and normal heart sounds.  Exam reveals no gallop and no friction rub.   No murmur heard. Pulmonary/Chest: Effort normal and breath sounds normal. No respiratory distress.  Abdominal: Soft. He exhibits no distension. There is tenderness.  Mild lower abdominal tenderness without rebound or guarding.  Musculoskeletal: He exhibits edema. He exhibits no tenderness.  Symmetric, pitting lower extremity edema  Neurological: He is alert.  Skin: Skin is warm and dry.  Psychiatric: He has a normal mood and affect. His behavior is normal. Thought content normal.  Nursing note and vitals reviewed.   ED Course  Procedures (including critical care time)  CRITICAL CARE Performed by: Virgel Manifold Total critical care time: 60 minutes Critical care time was exclusive of separately billable procedures and treating other patients. Critical care was necessary to treat or prevent imminent or life-threatening deterioration. Critical care was time spent personally by me on the following activities: development of treatment plan with patient and/or surrogate as well  as nursing, discussions with consultants, evaluation of patient's response to treatment, examination of patient, obtaining history from patient or surrogate, ordering and performing treatments and interventions, ordering and review of laboratory studies, ordering and review of radiographic studies, pulse oximetry and re-evaluation of patient's condition.  Labs Review Labs Reviewed  CBC WITH DIFFERENTIAL/PLATELET - Abnormal; Notable for the following:    WBC 18.4 (*)    Platelets 133 (*)    Neutro Abs 15.8 (*)    Monocytes Absolute 1.2 (*)    All other components within normal limits  BASIC METABOLIC PANEL - Abnormal; Notable for the following:    Potassium >7.5 (*)    CO2 17 (*)    Glucose, Bld 138 (*)    BUN 102 (*)    Creatinine, Ser 12.06 (*)    GFR calc non Af Amer 3 (*)    GFR calc Af Amer 4 (*)  Anion gap 16 (*)    All other components within normal limits  TROPONIN I - Abnormal; Notable for the following:    Troponin I 0.12 (*)    All other components within normal limits  BRAIN NATRIURETIC PEPTIDE - Abnormal; Notable for the following:    B Natriuretic Peptide 522.9 (*)    All other components within normal limits  URINALYSIS, ROUTINE W REFLEX MICROSCOPIC (NOT AT [facility]) - Abnormal; Notable for the following:    Color, Urine RED (*)    APPearance CLOUDY (*)    Glucose, UA 100 (*)    Hgb urine dipstick LARGE (*)    Protein, ur 100 (*)    Leukocytes, UA SMALL (*)    All other components within normal limits  URINE MICROSCOPIC-ADD ON - Abnormal; Notable for the following:    Squamous Epithelial / LPF FEW (*)    All other components within normal limits  BASIC METABOLIC PANEL    Imaging Review Dg Chest 2 View  02/15/2015  CLINICAL DATA:  Increased weakness over the past few days. Worsening cough. Chest congestion. EXAM: CHEST  2 VIEW COMPARISON:  02/12/2015. FINDINGS: Poor inspiration. Enlarged cardiac silhouette with a mild increase in size. Chronically elevated right  hemidiaphragm. Clear lungs. Small bilateral pleural effusions. Upper lumbar spine degenerative changes. IMPRESSION: 1. Small bilateral pleural effusions. 2. Mildly progressive cardiomegaly. 3. Stable chronically elevated right hemidiaphragm. Electronically Signed   By: Claudie Revering M.D.   On: 02/15/2015 10:48   I have personally reviewed and evaluated these images and lab results as part of my medical decision-making.   EKG Interpretation   Date/Time:  Monday February 15 2015 09:53:51 EDT Ventricular Rate:  92 PR Interval:  221 QRS Duration: 103 QT Interval:  348 QTC Calculation: 430 R Axis:   -2 Text Interpretation:  Sinus rhythm Prolonged PR interval Probable  anteroseptal infarct, old consider hyperkalemia, delayed conduction as  compared to previous and t waves somewhat peaked as well Confirmed by  Wilson Singer  MD, [place] ([id number]) on 02/15/2015 4:02:48 PM         MDM   Final diagnoses:  Renal failure  Hyperkalemia    84yM with general malaise. ARF. Normal renal function based on labs from March of this year. Pre-op labs from 10/19 with Cr of 3.16. 12.06 today. H/O high grade urothelial cell carcinoma. Foley placed. Only about 20cc of output. Discussed with pt's urologist, Dr. Della Goo at [facility]([phone]). Essentially palliative case and does not feel he requires transfer at this time. May need nephrostomy tubes, but doesn't necessitate transfer. Hyperkalemia with EKG changes. Being treated medically. Will CT to eval for evidence of hydro. Will discuss with nephrology. Possible transfer to [facility]. Troponin minimally elevated but of unclear significance with such poor renal function. Denies any CP.      Virgel Manifold, MD 02/17/15 [phone]

## 2015-02-15 NOTE — ED Notes (Signed)
Informed MD/RN of abnormal labs

## 2015-02-15 NOTE — Consult Note (Signed)
Subjective: I was asked to see Mr. Craig Waters in consultation by Dr. Rockne Menghini for ARF with bladder cancer, a left distal ureteral stone and bilateral hydro.  He was brought to the ER with a several week history of progressive weakness and malaise along with more recent progressive oliguria.  He has had some lower extremity edema.  His Cr was 0.98 in 3/16,  3.1 on 10/19  and is 12.97.  A CT today shows bilateral hydronephrosis with ureteral dilation to the bladder and a left distal ureteral stone.  There is a foley in the bladder and the bladder is not distended but there appears to be extensive tumor involving the entire bladder wall.  There is pelvic adenopathy right > left.  There is a possible liver metastasis as well.  He was to have a bladder tumor resection at [facility] on Weds but presented to the [facility] ER with progressive weakness.   He is a former patient of Dr. Arlyn Leak and had a prior TURP by him.   He had an initial bladder tumor resection at [facility] by Dr. [name] and was found to have a solid appearing tumor at the right trigone with extensive papillary disease of the right wall.  The right UO was not seen.  The path was T1 HG but a complete staging couldn't be confirmed.   He didn't have a repeat resection to complete staging.  He was not felt to be a candidate for cystectomy or BCG at that time and was to return for surveillance in September but that was rescheduled for this week.    He has had some hematuria but has had no specific flank pain.   ROS:  Review of Systems  Constitutional: Positive for malaise/fatigue. Negative for fever and chills.  HENT: Negative.   Eyes: Negative.   Respiratory: Positive for shortness of breath. Negative for cough.   Cardiovascular: Positive for leg swelling. Negative for chest pain and palpitations.  Gastrointestinal: Positive for abdominal pain (mild diffuse) and constipation. Negative for nausea and vomiting.  Genitourinary: Positive for hematuria.       He  has had poor urine output  Musculoskeletal: Positive for joint pain.  Skin: Negative.   Neurological: Positive for weakness.  Psychiatric/Behavioral: Negative.     Allergies  Allergen Reactions  . Other Other (See Comments)    Pain med-causes hallucinations (unknown drug name)    Past Medical History  Diagnosis Date  . Blood in stool   . Diverticulitis   . GERD (gastroesophageal reflux disease)   . Glaucoma   . Ulcer   . History of transfusion of whole blood   . Prostate disorder   . Shortness of breath 10-18-11    with exertion, cardiac cath done 7-8 yrs ago negative.  . Vertigo 10-18-11    inner ear issues occ. -tx. Bonine as needed  . Arthritis 10-18-11    back, fingers  . H/O hiatal hernia 10-18-11    noted on recent CXR  . Adenomatous colon polyp   . Urothelial carcinoma ([place]) 05/2014  . PAF (paroxysmal atrial fibrillation) ([place])   . Rheumatoid arthritis ([place])   . Discitis of thoracic region 07/04/2012    Past Surgical History  Procedure Laterality Date  . Gastric ulcer  10-18-11    '91-blleding ulcer with blood transfusions after  . Appendectomy  10-18-11    age 25  . Cataract extraction, bilateral  10-18-11    bilateral   . Eye surgery  10-18-11  laser eye surgery s/p cataract bilaterally  . Cardiac catheterization  10-18-11    7-8 yrs ago-mild blockage,no tx. indicated  . Prostatectomy  10/27/2011    Procedure: PROSTATECTOMY SUPRAPUBIC;  Surgeon: Ailene Rud, MD;  Location: [facility] ORS;  Service: Urology;  Laterality: N/A;  Placement of suprapubic tube  . Cystoscopy  10/27/2011    Procedure: CYSTOSCOPY FLEXIBLE;  Surgeon: Ailene Rud, MD;  Location: [facility];  Service: Urology;  Laterality: N/A;    Social History   Social History  . Marital Status: Married    Spouse Name: N/A  . Number of Children: N/A  . Years of Education: N/A   Occupational History  . [employer]   Social History Main Topics  . Smoking  status: Former Smoker -- 1.00 packs/day for 5 years    Types: Cigarettes    Quit date: 09/26/1978  . Smokeless tobacco: Current User    Types: Chew    Last Attempt to Quit: 11/01/2011  . Alcohol Use: No  . Drug Use: No  . Sexual Activity: Not on file   Other Topics Concern  . Not on file   Social History Narrative   Married, one daughter. One cup of coffee a day. He is semiretired.    Family History  Problem Relation Age of Onset  . Breast cancer Sister   . Dementia Mother   . Heart attack Father   . Colon cancer Neg Hx     Anti-infectives: Anti-infectives    None      Current Facility-Administered Medications  Medication Dose Route Frequency Provider Last Rate Last Dose  . calcium gluconate 1 g in sodium chloride 0.9 % 100 mL IVPB  1 g Intravenous Once Virgel Manifold, MD      . dextrose 5 % solution   Intravenous Continuous Christina P Rama, MD      . sodium bicarbonate injection 50 mEq  50 mEq Intravenous Once Virgel Manifold, MD      . sodium polystyrene (KAYEXALATE) 15 GM/60ML suspension 60 g  60 g Oral Once Tonye Royalty, MD       Current Outpatient Prescriptions  Medication Sig Dispense Refill  . acetaminophen (TYLENOL) 650 MG CR tablet Take 1,300 mg by mouth every 6 (six) hours as needed for pain.    Marland Kitchen acetaminophen-codeine (TYLENOL #3) 300-30 MG tablet Take 1 tablet by mouth daily as needed for moderate pain.     Marland Kitchen b complex vitamins tablet Take 1 tablet by mouth daily.    . Calcium Citrate-Vitamin D (CALCIUM + D PO) Take 1 tablet by mouth daily.    Marland Kitchen diltiazem (TIAZAC) 180 MG 24 hr capsule Take 180 mg by mouth daily.    . hydroxychloroquine (PLAQUENIL) 200 MG tablet Take 400 mg by mouth daily.    . hyoscyamine (LEVSIN SL) 0.125 MG SL tablet Take 0.125 mg by mouth every 4 (four) hours as needed (bladder spasms).     . Multiple Vitamin (MULTIVITAMIN WITH MINERALS) TABS tablet Take 1 tablet by mouth daily.    . polyethylene glycol powder (GLYCOLAX/MIRALAX) powder  Take 255 g by mouth daily. (Patient taking differently: Take 17 g by mouth daily. ) 500 g 3  . predniSONE (DELTASONE) 5 MG tablet Take 5-12.5 mg by mouth daily with breakfast. 10-18 to 10-31 12.5 mg, 11-1 to  11-14 10 mg, 11-15 to 11-29 7.5 mg, 11-30 to 12-13 5 mg then back to the MD for further directions.    Marland Kitchen  traMADol (ULTRAM) 50 MG tablet Take 50 mg by mouth every 6 (six) hours as needed for moderate pain.    He was recently started on Plaquenil for his RA.  Past, Family and Social history reviewed and updated.  Objective: Vital signs in last 24 hours: Temp:  [97.9 F (36.6 C)-98.8 F (37.1 C)] 98.8 F (37.1 C) (10/24 1745) Pulse Rate:  [94-106] 106 (10/24 1745) Resp:  [16-22] 16 (10/24 1745) BP: (169-174)/(77-95) 174/84 mmHg (10/24 1745) SpO2:  [93 %-100 %] 96 % (10/24 1745)  Intake/Output from previous day:   Intake/Output this shift: Total I/O In: 110 [IV Piggyback:110] Out: 15 [Urine:15]   Physical Exam  Constitutional: He is oriented to person, place, and time and well-developed, well-nourished, and in no distress. No distress.  HENT:  Head: Normocephalic and atraumatic.  Neck: Normal range of motion. Neck supple. No thyromegaly present.  Cardiovascular: Regular rhythm and normal heart sounds.  Tachycardia present.   Pulmonary/Chest: Effort normal and breath sounds normal. No respiratory distress.  Abdominal: Soft. Normal appearance. There is no hepatosplenomegaly. There is generalized tenderness (mild diffuse without CVAT. ). There is no CVA tenderness. No hernia.  Genitourinary:  Foley indwelling with scant UOP  Musculoskeletal: Normal range of motion.  Lymphadenopathy:    He has no cervical adenopathy.    He has no axillary adenopathy.       Right: No supraclavicular adenopathy present.  Neurological: He is alert and oriented to person, place, and time. He has normal motor skills.  Skin: Skin is warm, dry and intact.  Psychiatric: Mood and affect normal.   Vitals reviewed.   Lab Results:   Recent Labs  02/15/15 1206  WBC 18.4*  HGB 14.7  HCT 43.7  PLT 133*   BMET  Recent Labs  02/15/15 1206 02/15/15 1734  NA 137 138  K >7.5* >7.5*  CL 104 101  CO2 17* 22  GLUCOSE 138* 141*  BUN 102* 125*  CREATININE 12.06* 12.97*  CALCIUM 9.0 9.4   PT/INR No results for input(s): LABPROT, INR in the last 72 hours. ABG No results for input(s): PHART, HCO3 in the last 72 hours.  Invalid input(s): PCO2, PO2  Studies/Results: Ct Abdomen Pelvis Wo Contrast  02/15/2015  CLINICAL DATA:  81 year old male with abdominal pain and bloating since this morning. Recent dizziness. Acute renal failure. Current history of bladder cancer, gross hematuria. EXAM: CT ABDOMEN AND PELVIS WITHOUT CONTRAST TECHNIQUE: Multidetector CT imaging of the abdomen and pelvis was performed following the standard protocol without IV contrast. COMPARISON:  Renal ultrasound 02/12/2015. Lumbar MRI 07/29/2014. CT Abdomen and Pelvis 08/23/2011. FINDINGS: Chronic elevation of the right hemidiaphragm. Right middle lobe and lower lobe atelectasis with some air bronchograms. Superimposed bibasilar dependent atelectasis. Small volume pleural effusions in both costophrenic angles. No pericardial effusion. Chronic degenerative changes in the spine. Chronic lumbar pars defects associated with L3-L4 anterolisthesis. No acute or suspicious osseous lesion identified. Abnormal bladder wall thickening with Foley catheter in place. Prostate surgery suspected since the 2013 comparison. No pelvic free fluid. Negative rectum. Pelvic sidewall lymphadenopathy greater on the left, up to 10 mm short axis. No inguinal lymphadenopathy. Redundant sigmoid colon with mild diverticulosis. Occasional diverticula in the left colon. Mild diverticula at the hepatic flexure. Negative right colon. Appendix not identified. There is trace right lower quadrant free fluid associated with left greater than right  retroperitoneal and lateral Conal fascia stranding. See renal findings below. Most of the small bowel is decompressed. There are some gas-filled nondilated  loops in the ventral abdomen. The stomach is distended with oral contrast. Negative duodenum. Noncontrast gallbladder, spleen, pancreas, and adrenal glands are within normal limits. There is a hypodense posterior right lobe liver mass approximating 5.4 cm diameter (series 2, image 32) Which is subtle in the absence of IV contrast. No other liver lesion identified. This is new since 2013. There is a 5 x 10 mm distal left ureteral stone located 10-15 mm from the left ureterovesical junction. There is associated left hydroureter and left greater than right periureteral stranding with mild left hydronephrosis. Likewise there is mild right hydronephrosis and hydroureter, however, no right ureteral calculus is identified. No nephrolithiasis. Bilateral hypodense renal cysts have increased have mildly increased since 2013. No pneumoperitoneum. No upper abdominal free fluid. Aortoiliac calcified atherosclerosis noted. Ectatic infrarenal abdominal aorta measuring up to 27 mm diameter is stable since 2013. IMPRESSION: 1. 5 cm hypodense right lobe liver mass is new since 2013 and highly suspicious for metastatic disease in this setting. 2. Bladder wall thickening. Bilateral hydronephrosis and hydroureter suggesting obstructive uropathy. On the left there is a distal ureteral calculus measuring 5 x 10 mm located about 1 cm from the left UVJ. No right side urologic calculus, consider obstruction due to bladder tumor. 3. Left greater than right pelvic sidewall lymphadenopathy, up to 10 mm short axis. 4. Trace pleural effusions. Right greater than left lung base atelectasis. Electronically Signed   By: Genevie Ann M.D.   On: 02/15/2015 16:51   Dg Chest 2 View  02/15/2015  CLINICAL DATA:  Increased weakness over the past few days. Worsening cough. Chest congestion. EXAM: CHEST  2  VIEW COMPARISON:  02/12/2015. FINDINGS: Poor inspiration. Enlarged cardiac silhouette with a mild increase in size. Chronically elevated right hemidiaphragm. Clear lungs. Small bilateral pleural effusions. Upper lumbar spine degenerative changes. IMPRESSION: 1. Small bilateral pleural effusions. 2. Mildly progressive cardiomegaly. 3. Stable chronically elevated right hemidiaphragm. Electronically Signed   By: Claudie Revering M.D.   On: 02/15/2015 10:48   I have discussed the case with Dr. Rockne Menghini and reviewed the CT films and report and his recent labs as well as his records from [facility] on Care Everywhere.   Assessment: He has ARF with bilateral ureteral obstruction and oliguria that appears to be secondary to the bladder cancer but it may have been exacerbated by the left distal ureteral stone in the shorter term.  He has a High Grade T1 bladder cancer on initial resection but he probably has muscle invasive disease and now has probably liver and lymph node metastases.    I don't think that I would be able to place stents based on the history and appearance of the bladder on CT and feel that he would be best served with placement of nephrostomy tubes.  I originally recommended only a left nephrostomy but I think that if possible, bilateral tube placement would optimize recovery of renal function.    With the presence of probable mets he would benefit from an oncology consultation if he recovers well from this acute event.      CC: Dr. Margreta Journey Rama and Dr. Morrison Old.      Creg Gilmer J 02/15/2015 [phone]

## 2015-02-15 NOTE — ED Notes (Addendum)
Patient c/o tightness/bloating since this AM and states it has made it more difficult to breathe. Patient denies having a fever as to what family reported to EMS. Patient states he has started to use the walker due to onset of dizziness x 2-3 weeks and states it has gotten progressively worse.

## 2015-02-15 NOTE — ED Notes (Signed)
Bed: QD82 Expected date:  Expected time:  Means of arrival:  Comments: EMS- 81yo M, SOB/Dizziness

## 2015-02-15 NOTE — H&P (Signed)
History and Physical:    Craig Waters.   GEX:[id number] DOB: 1930/05/30 DOA: 02/15/2015  Referring MD/provider: Dr. Wilson Singer PCP: [name], MD   Chief Complaint: Weakness, nausea, decreased oral intake, unsteady gait  History of Present Illness:   Craig Macomber. is an 81 y.o. male with a PMH of rheumatoid arthritis on chronic prednisone and other immunosuppressive therapy, high-grade urothelial cell carcinoma, under the care of Dr. [name] at [facility], who has scheduled him for a palliative surgical procedure for 02/17/15 but who presents today with his daughter with generalized weakness, failure to thrive, nausea, loss of appetite, and an unsteady gait. The patient's daughter states that his condition has steadily worsened since his initial diagnosis. His daughter, apparently, took him to multiple physicians prior to this due to generalized failure to thrive where he ultimately was found to have a bladder mass. He underwent cystoscopy 06/12/2014 which confirmed urothelial carcinoma. He had a resection 07/07/14 and had a follow-up cystoscopy 11/16/14 showing persistent disease. He was then scheduled for cystoscopy, TURBT/ vaporization with possible intravesical epirubicin. Patient had preoperative labs done on 02/10/15 which showed a significant increase in his creatinine to 3.16 and a potassium of 5.5. His creatinine was 1.01 on 07/23/14. It does not appear that this was addressed, and the patient's daughter is understandably frustrated by this upon hearing that his kidneys are failing. The patient's daughter states that he has been having increasing abdominal and lower extremity swelling, shortness of breath that increases with any exertion, dizziness, and activity intolerance. He is also had decreased urine output. The patient is lethargic and unable to provide any additional history.  ROS:   Review of Systems  Unable to perform ROS    Past Medical History:   Past Medical  History  Diagnosis Date  . Blood in stool   . Diverticulitis   . GERD (gastroesophageal reflux disease)   . Glaucoma   . Ulcer   . History of transfusion of whole blood   . Prostate disorder   . Shortness of breath 10-18-11    with exertion, cardiac cath done 7-8 yrs ago negative.  . Vertigo 10-18-11    inner ear issues occ. -tx. Bonine as needed  . Arthritis 10-18-11    back, fingers  . H/O hiatal hernia 10-18-11    noted on recent CXR  . Adenomatous colon polyp   . Urothelial carcinoma ([place]) 05/2014  . PAF (paroxysmal atrial fibrillation) ([place])   . Rheumatoid arthritis ([place])   . Discitis of thoracic region 07/04/2012    Past Surgical History:   Past Surgical History  Procedure Laterality Date  . Gastric ulcer  10-18-11    '91-blleding ulcer with blood transfusions after  . Appendectomy  10-18-11    age 89  . Cataract extraction, bilateral  10-18-11    bilateral   . Eye surgery  10-18-11    laser eye surgery s/p cataract bilaterally  . Cardiac catheterization  10-18-11    7-8 yrs ago-mild blockage,no tx. indicated  . Prostatectomy  10/27/2011    Procedure: PROSTATECTOMY SUPRAPUBIC;  Surgeon: Ailene Rud, MD;  Location: [facility];  Service: Urology;  Laterality: N/A;  Placement of suprapubic tube  . Cystoscopy  10/27/2011    Procedure: CYSTOSCOPY FLEXIBLE;  Surgeon: Ailene Rud, MD;  Location: [facility];  Service: Urology;  Laterality: N/A;    Social History:   Social History   Social History  . Marital Status: Married    Spouse Name:  N/A  . Number of Children: N/A  . Years of Education: N/A   Occupational History  . [employer]   Social History Main Topics  . Smoking status: Former Smoker -- 1.00 packs/day for 5 years    Types: Cigarettes    Quit date: 09/26/1978  . Smokeless tobacco: Current User    Types: Chew    Last Attempt to Quit: 11/01/2011  . Alcohol Use: No  . Drug Use: No  . Sexual Activity: Not on file    Other Topics Concern  . Not on file   Social History Narrative   Married, one daughter. One cup of coffee a day. He is semiretired.    Family history:   Family History  Problem Relation Age of Onset  . Breast cancer Sister   . Dementia Mother   . Heart attack Father   . Colon cancer Neg Hx     Allergies   Other  Current Medications:   Prior to Admission medications   Medication Sig Start Date End Date Taking? Authorizing Provider  acetaminophen (TYLENOL) 650 MG CR tablet Take 1,300 mg by mouth every 6 (six) hours as needed for pain.   Yes Historical Provider, MD  acetaminophen-codeine (TYLENOL #3) 300-30 MG tablet Take 1 tablet by mouth daily as needed for moderate pain.    Yes Historical Provider, MD  b complex vitamins tablet Take 1 tablet by mouth daily.   Yes Historical Provider, MD  Calcium Citrate-Vitamin D (CALCIUM + D PO) Take 1 tablet by mouth daily.   Yes Historical Provider, MD  diltiazem (TIAZAC) 180 MG 24 hr capsule Take 180 mg by mouth daily.   Yes Historical Provider, MD  hydroxychloroquine (PLAQUENIL) 200 MG tablet Take 400 mg by mouth daily.   Yes Historical Provider, MD  hyoscyamine (LEVSIN SL) 0.125 MG SL tablet Take 0.125 mg by mouth every 4 (four) hours as needed (bladder spasms).  02/01/15  Yes Historical Provider, MD  Multiple Vitamin (MULTIVITAMIN WITH MINERALS) TABS tablet Take 1 tablet by mouth daily. 05/15/14  Yes Erline Hau, MD  polyethylene glycol powder (GLYCOLAX/MIRALAX) powder Take 255 g by mouth daily. Patient taking differently: Take 17 g by mouth daily.  11/27/14  Yes Willia Craze, NP  predniSONE (DELTASONE) 5 MG tablet Take 5-12.5 mg by mouth daily with breakfast. 10-18 to 10-31 12.5 mg, 11-1 to  11-14 10 mg, 11-15 to 11-29 7.5 mg, 11-30 to 12-13 5 mg then back to the MD for further directions.   Yes Historical Provider, MD  traMADol (ULTRAM) 50 MG tablet Take 50 mg by mouth every 6 (six) hours as needed for moderate pain.    Yes Historical Provider, MD    Physical Exam:   Filed Vitals:   02/15/15 1100 02/15/15 1400 02/15/15 1430 02/15/15 1745  BP: 173/77 172/95 169/92 174/84  Pulse: 95 105 101 106  Temp:    98.8 F (37.1 C)  TempSrc:    Oral  Resp: 20 18 22 16   SpO2: 98% 100% 98% 96%     Physical Exam: Blood pressure 174/84, pulse 106, temperature 98.8 F (37.1 C), temperature source Oral, resp. rate 16, SpO2 96 %. Gen: No acute distress. Lethargic. Head: Normocephalic, atraumatic. Eyes: PERRL, EOMI, sclerae nonicteric. Mouth: Oropharynx clear. Neck: Supple, no thyromegaly, no lymphadenopathy, no jugular venous distention. Chest: Lungs clear to auscultation bilaterally. CV: Heart sounds are mildly tachycardic. No murmurs, rubs, or gallops. Abdomen: Soft, nontender, nondistended with normal  active bowel sounds. Extremities: Extremities show 2+ pitting edema bilaterally. Skin: Warm and dry. Neuro: Lethargic, nonfocal. Psych: Mood and affect flat.   Data Review:    Labs: Basic Metabolic Panel:  Recent Labs Lab 02/15/15 1206  NA 137  K >7.5*  CL 104  CO2 17*  GLUCOSE 138*  BUN 102*  CREATININE 12.06*  CALCIUM 9.0   Liver Function Tests: No results for input(s): AST, ALT, ALKPHOS, BILITOT, PROT, ALBUMIN in the last 168 hours. No results for input(s): LIPASE, AMYLASE in the last 168 hours. No results for input(s): AMMONIA in the last 168 hours. CBC:  Recent Labs Lab 02/15/15 1206  WBC 18.4*  NEUTROABS 15.8*  HGB 14.7  HCT 43.7  MCV 90.5  PLT 133*   Cardiac Enzymes:  Recent Labs Lab 02/15/15 1206  TROPONINI 0.12*    BNP (last 3 results) No results for input(s): PROBNP in the last 8760 hours. CBG: No results for input(s): GLUCAP in the last 168 hours.  Radiographic Studies: Ct Abdomen Pelvis Wo Contrast  02/15/2015  CLINICAL DATA:  81 year old male with abdominal pain and bloating since this morning. Recent dizziness. Acute renal failure. Current history of  bladder cancer, gross hematuria. EXAM: CT ABDOMEN AND PELVIS WITHOUT CONTRAST TECHNIQUE: Multidetector CT imaging of the abdomen and pelvis was performed following the standard protocol without IV contrast. COMPARISON:  Renal ultrasound 02/12/2015. Lumbar MRI 07/29/2014. CT Abdomen and Pelvis 08/23/2011. FINDINGS: Chronic elevation of the right hemidiaphragm. Right middle lobe and lower lobe atelectasis with some air bronchograms. Superimposed bibasilar dependent atelectasis. Small volume pleural effusions in both costophrenic angles. No pericardial effusion. Chronic degenerative changes in the spine. Chronic lumbar pars defects associated with L3-L4 anterolisthesis. No acute or suspicious osseous lesion identified. Abnormal bladder wall thickening with Foley catheter in place. Prostate surgery suspected since the 2013 comparison. No pelvic free fluid. Negative rectum. Pelvic sidewall lymphadenopathy greater on the left, up to 10 mm short axis. No inguinal lymphadenopathy. Redundant sigmoid colon with mild diverticulosis. Occasional diverticula in the left colon. Mild diverticula at the hepatic flexure. Negative right colon. Appendix not identified. There is trace right lower quadrant free fluid associated with left greater than right retroperitoneal and lateral Conal fascia stranding. See renal findings below. Most of the small bowel is decompressed. There are some gas-filled nondilated loops in the ventral abdomen. The stomach is distended with oral contrast. Negative duodenum. Noncontrast gallbladder, spleen, pancreas, and adrenal glands are within normal limits. There is a hypodense posterior right lobe liver mass approximating 5.4 cm diameter (series 2, image 32) Which is subtle in the absence of IV contrast. No other liver lesion identified. This is new since 2013. There is a 5 x 10 mm distal left ureteral stone located 10-15 mm from the left ureterovesical junction. There is associated left hydroureter and  left greater than right periureteral stranding with mild left hydronephrosis. Likewise there is mild right hydronephrosis and hydroureter, however, no right ureteral calculus is identified. No nephrolithiasis. Bilateral hypodense renal cysts have increased have mildly increased since 2013. No pneumoperitoneum. No upper abdominal free fluid. Aortoiliac calcified atherosclerosis noted. Ectatic infrarenal abdominal aorta measuring up to 27 mm diameter is stable since 2013. IMPRESSION: 1. 5 cm hypodense right lobe liver mass is new since 2013 and highly suspicious for metastatic disease in this setting. 2. Bladder wall thickening. Bilateral hydronephrosis and hydroureter suggesting obstructive uropathy. On the left there is a distal ureteral calculus measuring 5 x 10 mm located about 1 cm from the  left UVJ. No right side urologic calculus, consider obstruction due to bladder tumor. 3. Left greater than right pelvic sidewall lymphadenopathy, up to 10 mm short axis. 4. Trace pleural effusions. Right greater than left lung base atelectasis. Electronically Signed   By: Genevie Ann M.D.   On: 02/15/2015 16:51   Dg Chest 2 View  02/15/2015  CLINICAL DATA:  Increased weakness over the past few days. Worsening cough. Chest congestion. EXAM: CHEST  2 VIEW COMPARISON:  02/12/2015. FINDINGS: Poor inspiration. Enlarged cardiac silhouette with a mild increase in size. Chronically elevated right hemidiaphragm. Clear lungs. Small bilateral pleural effusions. Upper lumbar spine degenerative changes. IMPRESSION: 1. Small bilateral pleural effusions. 2. Mildly progressive cardiomegaly. 3. Stable chronically elevated right hemidiaphragm. Electronically Signed   By: Claudie Revering M.D.   On: 02/15/2015 10:48   *I have personally reviewed the images above*  EKG: Independently reviewed. Sinus rhythm at 92 bpm. No peaked T waves noted.   Assessment/Plan:   Principal Problem:   Acute renal failure (ARF) (HCC) in the setting of  obstructive uropathy/urothelial carcinoma of the bladder with bilateral hydronephrosis and left nephrolithiasis with metabolic acidosis and hyperkalemia - Urology and nephrology consulted.  Dr. Lorrene Reid (per EDP) and Dr. Jeffie Pollock to see the patient. - Left PCN requested per IR. - Start IVF with bicarb at 30 mL/hour. - Given calcium gluconate and 60 g of Kayexalate. Monitor on telemetry.  Active Problems:   Encephalopathy acute - Likely from uremia. Hold all medications for now.    HTN (hypertension) - May need dialysis.    Rheumatoid arthritis (HCC)/chronic immunosuppression - Hold immunosuppressive therapy. If the patient becomes hypotensive, initiate stress dose steroids.    DVT prophylaxis - Subcutaneous heparin ordered.  Waters Status / Family Communication / Disposition Plan:   Waters Status: Full. Family Communication: Daughter at the bedside. Disposition Plan: Home when stable.  Attestation regarding necessity of inpatient status:   The appropriate admission status for this patient is INPATIENT. Inpatient status is judged to be reasonable and necessary in order to provide the required intensity of service to ensure the patient's safety. The patient's presenting symptoms, physical exam findings, and initial radiographic and laboratory data in the context of their chronic comorbidities is felt to place them at high risk for further clinical deterioration. Furthermore, it is not anticipated that the patient will be medically stable for discharge from the hospital within 2 midnights of admission. The following factors support the admission status of inpatient.   -The patient's presenting symptoms include generalized weakness, failure to thrive, encephalopathy. - The worrisome physical exam findings include lethargy and volume overload. - The initial radiographic and laboratory data are worrisome because of creatinine greater than 12, potassium 7.5. - The chronic co-morbidities include  diabetes, hypertension, urothelial cancer. - Patient requires inpatient status due to high intensity of service, high risk for further deterioration and high frequency of surveillance required. - I certify that at the point of admission it is my clinical judgment that the patient will require inpatient hospital care spanning beyond 2 midnights from the point of admission.   Time spent: 80 minutes.  RAMA,CHRISTINA [facility] Pager [phone] Cell: [phone]   If 7PM-7AM, please contact night-coverage [email or web address] Password [email or web address] 02/15/2015, 5:52 PM

## 2015-02-15 NOTE — ED Notes (Signed)
2 attempts drawing labs, unsuccessful. RN Aware

## 2015-02-16 ENCOUNTER — Inpatient Hospital Stay (HOSPITAL_COMMUNITY): Payer: Medicare Other

## 2015-02-16 LAB — PROTIME-INR
INR: 1.12 (ref 0.00–1.49)
Prothrombin Time: 14.6 seconds (ref 11.6–15.2)

## 2015-02-16 LAB — BASIC METABOLIC PANEL
ANION GAP: 17 — AB (ref 5–15)
ANION GAP: 13 (ref 5–15)
ANION GAP: 13 (ref 5–15)
BUN: 125 mg/dL — ABNORMAL HIGH (ref 6–20)
BUN: 114 mg/dL — ABNORMAL HIGH (ref 6–20)
BUN: 111 mg/dL — ABNORMAL HIGH (ref 6–20)
CALCIUM: 9.3 mg/dL (ref 8.9–10.3)
CHLORIDE: 102 mmol/L (ref 101–111)
CHLORIDE: 101 mmol/L (ref 101–111)
CO2: 20 mmol/L — ABNORMAL LOW (ref 22–32)
CO2: 25 mmol/L (ref 22–32)
CO2: 26 mmol/L (ref 22–32)
CREATININE: 13.22 mg/dL — AB (ref 0.61–1.24)
Calcium: 9 mg/dL (ref 8.9–10.3)
Calcium: 8.8 mg/dL — ABNORMAL LOW (ref 8.9–10.3)
Chloride: 101 mmol/L (ref 101–111)
Creatinine, Ser: 11.99 mg/dL — ABNORMAL HIGH (ref 0.61–1.24)
Creatinine, Ser: 11.18 mg/dL — ABNORMAL HIGH (ref 0.61–1.24)
GFR calc non Af Amer: 3 mL/min — ABNORMAL LOW (ref >60)
GFR calc non Af Amer: 4 mL/min — ABNORMAL LOW (ref >60)
GFR, EST AFRICAN AMERICAN: 3 mL/min — AB (ref >60)
GFR, EST AFRICAN AMERICAN: 4 mL/min — AB (ref >60)
GFR, EST AFRICAN AMERICAN: 4 mL/min — AB (ref >60)
GFR, EST NON AFRICAN AMERICAN: 3 mL/min — AB (ref >60)
GLUCOSE: 136 mg/dL — AB (ref 65–99)
GLUCOSE: 146 mg/dL — AB (ref 65–99)
Glucose, Bld: 179 mg/dL — ABNORMAL HIGH (ref 65–99)
POTASSIUM: 6.5 mmol/L — AB (ref 3.5–5.1)
POTASSIUM: 5.9 mmol/L — AB (ref 3.5–5.1)
Potassium: >7.5 mmol/L (ref 3.5–5.1)
SODIUM: 138 mmol/L (ref 135–145)
Sodium: 140 mmol/L (ref 135–145)
Sodium: 140 mmol/L (ref 135–145)

## 2015-02-16 LAB — RENAL FUNCTION PANEL
ALBUMIN: 2.7 g/dL — AB (ref 3.5–5.0)
Anion gap: 14 (ref 5–15)
BUN: 100 mg/dL — AB (ref 6–20)
CHLORIDE: 102 mmol/L (ref 101–111)
CO2: 24 mmol/L (ref 22–32)
Calcium: 8.7 mg/dL — ABNORMAL LOW (ref 8.9–10.3)
Creatinine, Ser: 10.12 mg/dL — ABNORMAL HIGH (ref 0.61–1.24)
GFR, EST AFRICAN AMERICAN: 5 mL/min — AB (ref >60)
GFR, EST NON AFRICAN AMERICAN: 4 mL/min — AB (ref >60)
Glucose, Bld: 136 mg/dL — ABNORMAL HIGH (ref 65–99)
PHOSPHORUS: 5.9 mg/dL — AB (ref 2.5–4.6)
POTASSIUM: 6.1 mmol/L — AB (ref 3.5–5.1)
Sodium: 140 mmol/L (ref 135–145)

## 2015-02-16 LAB — CBC
HCT: 42.7 % (ref 39.0–52.0)
Hemoglobin: 14.2 g/dL (ref 13.0–17.0)
MCH: 30.5 pg (ref 26.0–34.0)
MCHC: 33.3 g/dL (ref 30.0–36.0)
MCV: 91.6 fL (ref 78.0–100.0)
Platelets: 128 10*3/uL — ABNORMAL LOW (ref 150–400)
RBC: 4.66 MIL/uL (ref 4.22–5.81)
RDW: 15.3 % (ref 11.5–15.5)
WBC: 16.3 10*3/uL — ABNORMAL HIGH (ref 4.0–10.5)

## 2015-02-16 LAB — CREATININE, URINE, RANDOM: CREATININE, URINE: 50.8 mg/dL

## 2015-02-16 LAB — SODIUM, URINE, RANDOM: SODIUM UR: 112 mmol/L

## 2015-02-16 MED ORDER — MIDAZOLAM HCL 2 MG/2ML IJ SOLN
INTRAMUSCULAR | Status: AC | PRN
  Administered 2015-02-16 (×5): 0.5 mg via INTRAVENOUS

## 2015-02-16 MED ORDER — HEPARIN SODIUM (PORCINE) 1000 UNIT/ML IJ SOLN
INTRAMUSCULAR | Status: AC
  Filled 2015-02-16: qty 1

## 2015-02-16 MED ORDER — FENTANYL CITRATE (PF) 100 MCG/2ML IJ SOLN
INTRAMUSCULAR | Status: AC | PRN
  Administered 2015-02-16 (×2): 25 ug via INTRAVENOUS

## 2015-02-16 MED ORDER — CETYLPYRIDINIUM CHLORIDE 0.05 % MT LIQD: 7.0000 mL | Freq: Two times a day (BID) | OROMUCOSAL | Status: DC

## 2015-02-16 MED ORDER — DEXTROSE 50 % IV SOLN
1.0000 | Freq: Once | INTRAVENOUS | Status: AC
  Administered 2015-02-16: 50 mL via INTRAVENOUS
  Filled 2015-02-16: qty 50

## 2015-02-16 MED ORDER — HEPARIN SODIUM (PORCINE) 1000 UNIT/ML IJ SOLN
INTRAMUSCULAR | Status: AC | PRN
  Administered 2015-02-16 (×2): 1.4 mL via INTRAVENOUS

## 2015-02-16 MED ORDER — MIDAZOLAM HCL 2 MG/2ML IJ SOLN
INTRAMUSCULAR | Status: AC
  Filled 2015-02-16: qty 6

## 2015-02-16 MED ORDER — CHLORHEXIDINE GLUCONATE 0.12 % MT SOLN
15.0000 mL | Freq: Two times a day (BID) | OROMUCOSAL | Status: DC
  Administered 2015-02-16 – 2015-02-22 (×11): 15 mL via OROMUCOSAL
  Filled 2015-02-16 (×4): qty 15

## 2015-02-16 MED ORDER — LIDOCAINE HCL 1 % IJ SOLN
INTRAMUSCULAR | Status: AC
  Filled 2015-02-16: qty 40

## 2015-02-16 MED ORDER — SODIUM CHLORIDE 0.9 % IV SOLN
1.0000 g | Freq: Once | INTRAVENOUS | Status: AC
  Administered 2015-02-16: 1 g via INTRAVENOUS
  Filled 2015-02-16 (×2): qty 10

## 2015-02-16 MED ORDER — SODIUM POLYSTYRENE SULFONATE 15 GM/60ML PO SUSP: 30.0000 g | Freq: Once | ORAL | Status: DC

## 2015-02-16 MED ORDER — METOPROLOL TARTRATE 1 MG/ML IV SOLN
5.0000 mg | Freq: Once | INTRAVENOUS | Status: AC
  Administered 2015-02-16: 5 mg via INTRAVENOUS

## 2015-02-16 MED ORDER — METOPROLOL TARTRATE 1 MG/ML IV SOLN
INTRAVENOUS | Status: AC
  Filled 2015-02-16: qty 5

## 2015-02-16 MED ORDER — SODIUM POLYSTYRENE SULFONATE 15 GM/60ML PO SUSP
60.0000 g | Freq: Once | ORAL | Status: AC
  Administered 2015-02-16: 60 g via ORAL
  Filled 2015-02-16: qty 240

## 2015-02-16 MED ORDER — CHLORHEXIDINE GLUCONATE 0.12 % MT SOLN: 15.0000 mL | Freq: Two times a day (BID) | OROMUCOSAL | Status: DC

## 2015-02-16 MED ORDER — PREDNISONE 10 MG PO TABS
10.0000 mg | ORAL_TABLET | Freq: Every day | ORAL | Status: DC
  Administered 2015-02-17 – 2015-02-22 (×6): 10 mg via ORAL
  Filled 2015-02-16 (×6): qty 1

## 2015-02-16 MED ORDER — IOHEXOL 300 MG/ML  SOLN
50.0000 mL | Freq: Once | INTRAMUSCULAR | Status: DC | PRN
  Administered 2015-02-16: 50 mL via INTRAVENOUS
  Filled 2015-02-16: qty 50

## 2015-02-16 MED ORDER — CETYLPYRIDINIUM CHLORIDE 0.05 % MT LIQD
7.0000 mL | Freq: Two times a day (BID) | OROMUCOSAL | Status: DC
  Administered 2015-02-16 – 2015-02-22 (×7): 7 mL via OROMUCOSAL

## 2015-02-16 MED ORDER — FENTANYL CITRATE (PF) 100 MCG/2ML IJ SOLN
INTRAMUSCULAR | Status: AC
  Filled 2015-02-16: qty 4

## 2015-02-16 MED ORDER — HYDRALAZINE HCL 20 MG/ML IJ SOLN
10.0000 mg | Freq: Once | INTRAMUSCULAR | Status: AC
  Administered 2015-02-16: 10 mg via INTRAVENOUS
  Filled 2015-02-16: qty 1

## 2015-02-16 MED ORDER — SODIUM BICARBONATE 8.4 % IV SOLN
INTRAVENOUS | Status: DC
  Administered 2015-02-16 – 2015-02-17 (×3): via INTRAVENOUS
  Filled 2015-02-16 (×3): qty 150

## 2015-02-16 MED ORDER — INSULIN ASPART 100 UNIT/ML ~~LOC~~ SOLN
10.0000 [IU] | Freq: Once | SUBCUTANEOUS | Status: AC
  Administered 2015-02-16: 10 [IU] via SUBCUTANEOUS

## 2015-02-16 MED ORDER — PREDNISONE 5 MG PO TABS: 5.0000 mg | ORAL_TABLET | Freq: Every day | ORAL | Status: DC

## 2015-02-16 MED ORDER — CIPROFLOXACIN IN D5W 400 MG/200ML IV SOLN
INTRAVENOUS | Status: AC
  Filled 2015-02-16: qty 200

## 2015-02-16 MED ORDER — CIPROFLOXACIN IN D5W 400 MG/200ML IV SOLN
400.0000 mg | Freq: Once | INTRAVENOUS | Status: AC
  Administered 2015-02-16: 400 mg via INTRAVENOUS

## 2015-02-16 NOTE — Procedures (Signed)
Interventional Radiology Procedure Note  Procedure: Placement of a right IJ approach triple lumen HD catheter.  20cm.  Tip is positioned at the superior cavoatrial junction and catheter is ready for immediate use.  Complications: None   Recommendations:  - Ok to dialyze - Do not submerge - Routine line care   Disposition:  Changing to prone position in IR for percutaneous nephrostomy placement.  Signed,  Dulcy Fanny. Earleen Newport, DO

## 2015-02-16 NOTE — Consult Note (Addendum)
Requesting Physician:  Dr. Broadus John Reason for Consultation: Acute renal failure, hyperkalemia HPI: The patient is a 81 y.o. year-old WM with a background of RA on steroids, diverticular disease, GERD, PUD, PAF. He was diagnosed with a urothelial bladder cancer for which he was to undergo surgery at [facility] tomorrow. He presented to the ED yesterday with progressive weakness, malaise and a decline in UOP. Was found to have a creatinine of 12, K >7.5. CT of the abdomen and pelvis showed bilateral hydronephrosis with ureteral dilation to the bladder and a left distal ureteral stone. Bladder was not distended but there appeared to be extensive tumor involving the entire bladder wall. There is pelvic adenopathy right > left. He was seen yesterday by Urology who recommended placement of bilateral percutaneous nephrostomy tubes but IR physician said to me by phone that he felt "obstruction doesn't fully explain his kidney failure" and wanted our recommendation before proceeding.  Foley is in place with only a small amount of bloody urine.  He has had K>7.5 for >24 hours despite multiple interventions.  According to pt's daughter he has not been using any recent NSAIDS. Only new med is plaquenil for his RA. No ACE, ARB, PPI or other nephrotoxins  CREATININE, SER  Date/Time Value Ref Range Status  02/16/2015 03:45 AM 13.22* 0.61 - 1.24 mg/dL Final  02/15/2015 10:16 PM 13.00* 0.61 - 1.24 mg/dL Final  02/15/2015 05:34 PM 12.97* 0.61 - 1.24 mg/dL Final  02/15/2015 12:06 PM 12.06* 0.61 - 1.24 mg/dL Final  02/10/2015 3's (pre-op for bladder Ca surgery)    07/23/2014 1.01    05/14/2014 08:41 PM 1.08 0.50 - 1.35 mg/dL Final  07/08/2012 05:00 AM 0.73 0.50 - 1.35 mg/dL Final  07/06/2012 05:45 AM 0.94 0.50 - 1.35 mg/dL Final  07/04/2012 03:16 PM 0.93 0.50 - 1.35 mg/dL Final  11/12/2011 05:25 AM 0.83 0.50 - 1.35 mg/dL Final  11/11/2011 05:11 AM 0.72 0.50 - 1.35 mg/dL Final  11/10/2011 05:00 AM 0.86 0.50 - 1.35  mg/dL Final  11/08/2011 07:30 PM 1.03 0.50 - 1.35 mg/dL Final  10/30/2011 04:10 AM 0.95 0.50 - 1.35 mg/dL Final  10/28/2011 04:59 AM 1.03 0.50 - 1.35 mg/dL Final  10/27/2011 12:50 PM 0.97 0.50 - 1.35 mg/dL Final  10/18/2011 10:40 AM 0.87 0.50 - 1.35 mg/dL Final  10/24/2010 09:56 AM 0.9 0.4 - 1.5 mg/dL Final     Past Medical History  Diagnosis Date  . Blood in stool   . Diverticulitis   . GERD (gastroesophageal reflux disease)   . Glaucoma   . Ulcer   . History of transfusion of whole blood   . Prostate disorder   . Shortness of breath 10-18-11    with exertion, cardiac cath done 7-8 yrs ago negative.  . Vertigo 10-18-11    inner ear issues occ. -tx. [place] as needed  . Arthritis 10-18-11    back, fingers  . H/O hiatal hernia 10-18-11    noted on recent CXR  . Adenomatous colon polyp   . Urothelial carcinoma ([place]) 05/2014  . PAF (paroxysmal atrial fibrillation) ([place])   . Rheumatoid arthritis ([place])   . Discitis of thoracic region 07/04/2012     Past Surgical History  Procedure Laterality Date  . Gastric ulcer  10-18-11    '91-blleding ulcer with blood transfusions after  . Appendectomy  10-18-11    age 33  . Cataract extraction, bilateral  10-18-11    bilateral   . Eye surgery  10-18-11    laser  eye surgery s/p cataract bilaterally  . Cardiac catheterization  10-18-11    7-8 yrs ago-mild blockage,no tx. indicated  . Prostatectomy  10/27/2011    Procedure: PROSTATECTOMY SUPRAPUBIC;  Surgeon: Ailene Rud, MD;  Location: [facility];  Service: Urology;  Laterality: N/A;  Placement of suprapubic tube  . Cystoscopy  10/27/2011    Procedure: CYSTOSCOPY FLEXIBLE;  Surgeon: Ailene Rud, MD;  Location: [facility];  Service: Urology;  Laterality: N/A;  . Transurethral resection of bladder tumor with gyrus (turbt-gyrus)  06/2014    T1 HG tumor.  [facility].     Family History  Problem Relation Age of Onset  . Breast cancer Sister   . Dementia Mother   . Heart attack Father    . Colon cancer Neg Hx    Social History:  reports that he quit smoking about 36 years ago. His smoking use included Cigarettes. He has a 5 pack-year smoking history. His smokeless tobacco use includes Chew. He reports that he does not drink alcohol or use illicit drugs.  Allergies:  Allergies  Allergen Reactions  . Other Other (See Comments)    Pain med-causes hallucinations (unknown drug name)    Home medications: Prior to Admission medications   Medication Sig Start Date End Date Taking? Authorizing Provider  acetaminophen (TYLENOL) 650 MG CR tablet Take 1,300 mg by mouth every 6 (six) hours as needed for pain.   Yes Historical Provider, MD  acetaminophen-codeine (TYLENOL #3) 300-30 MG tablet Take 1 tablet by mouth daily as needed for moderate pain.    Yes Historical Provider, MD  b complex vitamins tablet Take 1 tablet by mouth daily.   Yes Historical Provider, MD  Calcium Citrate-Vitamin D (CALCIUM + D PO) Take 1 tablet by mouth daily.   Yes Historical Provider, MD  diltiazem (TIAZAC) 180 MG 24 hr capsule Take 180 mg by mouth daily.   Yes Historical Provider, MD  hydroxychloroquine (PLAQUENIL) 200 MG tablet Take 400 mg by mouth daily.   Yes Historical Provider, MD  hyoscyamine (LEVSIN SL) 0.125 MG SL tablet Take 0.125 mg by mouth every 4 (four) hours as needed (bladder spasms).  02/01/15  Yes Historical Provider, MD  Multiple Vitamin (MULTIVITAMIN WITH MINERALS) TABS tablet Take 1 tablet by mouth daily. 05/15/14  Yes Erline Hau, MD  polyethylene glycol powder (GLYCOLAX/MIRALAX) powder Take 255 g by mouth daily. Patient taking differently: Take 17 g by mouth daily.  11/27/14  Yes Willia Craze, NP  predniSONE (DELTASONE) 5 MG tablet Take 5-12.5 mg by mouth daily with breakfast. 10-18 to 10-31 12.5 mg, 11-1 to  11-14 10 mg, 11-15 to 11-29 7.5 mg, 11-30 to 12-13 5 mg then back to the MD for further directions.   Yes Historical Provider, MD  traMADol (ULTRAM) 50 MG tablet  Take 50 mg by mouth every 6 (six) hours as needed for moderate pain.   Yes Historical Provider, MD    Inpatient medications: . antiseptic oral rinse  7 mL Mouth Rinse q12n4p  . chlorhexidine  15 mL Mouth Rinse BID  . heparin  5,000 Units Subcutaneous 3 times per day  . sodium chloride  3 mL Intravenous Q12H  . sodium chloride  3 mL Intravenous Q12H    Review of Systems + sluggishness, lethargy + reduced UOP + increase in abd swelling, fatigue, DOE + bloody urine   Physical Exam:  BP 181/82 mmHg  Pulse 113  Temp(Src) 98.6 F (37 C) (Oral)  Resp 22  Ht 5\' 11"  (1.803 m)  Wt 101.6 kg (223 lb 15.8 oz)  BMI 31.25 kg/m2  SpO2 94%   Gen: Large framed pale app WM - lethargic but able to converse, give history and understands that his kidneys are not working Skin: no rash, cyanosis Neck: no JVD, no bruits or LAN Chest: Grossly clear Heart: regular, no rub or gallop Abdomen: soft, obese, protuberant, no focal tenderness Ext: Trace LE edema Neuro: Awakes but lethargic. Some occasional myoclonic jerks.  Mild asterixus.    Heme/Lymph: no bruising or LAN Labs: Basic Metabolic Panel:  Recent Labs Lab 02/15/15 1206 02/15/15 1734 02/15/15 2216 02/16/15 0345  NA 137 138 138 138  K >7.5* >7.5* >7.5* >7.5*  CL 104 101 101 101  CO2 17* 22 22 20*  GLUCOSE 138* 141* 167* 179*  BUN 102* 125* 128* 125*  CREATININE 12.06* 12.97* 13.00* 13.22*  CALCIUM 9.0 9.4 9.3 9.3    Recent Labs Lab 02/15/15 1206 02/16/15 0735  WBC 18.4* 16.3*  NEUTROABS 15.8*  --   HGB 14.7 14.2  HCT 43.7 42.7  MCV 90.5 91.6  PLT 133* 128*     Recent Labs Lab 02/15/15 1206  TROPONINI 0.12*    Xrays/Other Studies: Ct Abdomen Pelvis Wo Contrast  02/15/2015  CLINICAL DATA:  81 year old male with abdominal pain and bloating since this morning. Recent dizziness. Acute renal failure. Current history of bladder cancer, gross hematuria. EXAM: CT ABDOMEN AND PELVIS WITHOUT CONTRAST TECHNIQUE:  Multidetector CT imaging of the abdomen and pelvis was performed following the standard protocol without IV contrast. COMPARISON:  Renal ultrasound 02/12/2015. Lumbar MRI 07/29/2014. CT Abdomen and Pelvis 08/23/2011. FINDINGS: Chronic elevation of the right hemidiaphragm. Right middle lobe and lower lobe atelectasis with some air bronchograms. Superimposed bibasilar dependent atelectasis. Small volume pleural effusions in both costophrenic angles. No pericardial effusion. Chronic degenerative changes in the spine. Chronic lumbar pars defects associated with L3-L4 anterolisthesis. No acute or suspicious osseous lesion identified. Abnormal bladder wall thickening with Foley catheter in place. Prostate surgery suspected since the 2013 comparison. No pelvic free fluid. Negative rectum. Pelvic sidewall lymphadenopathy greater on the left, up to 10 mm short axis. No inguinal lymphadenopathy. Redundant sigmoid colon with mild diverticulosis. Occasional diverticula in the left colon. Mild diverticula at the hepatic flexure. Negative right colon. Appendix not identified. There is trace right lower quadrant free fluid associated with left greater than right retroperitoneal and lateral Conal fascia stranding. See renal findings below. Most of the small bowel is decompressed. There are some gas-filled nondilated loops in the ventral abdomen. The stomach is distended with oral contrast. Negative duodenum. Noncontrast gallbladder, spleen, pancreas, and adrenal glands are within normal limits. There is a hypodense posterior right lobe liver mass approximating 5.4 cm diameter (series 2, image 32) Which is subtle in the absence of IV contrast. No other liver lesion identified. This is new since 2013. There is a 5 x 10 mm distal left ureteral stone located 10-15 mm from the left ureterovesical junction. There is associated left hydroureter and left greater than right periureteral stranding with mild left hydronephrosis. Likewise  there is mild right hydronephrosis and hydroureter, however, no right ureteral calculus is identified. No nephrolithiasis. Bilateral hypodense renal cysts have increased have mildly increased since 2013. No pneumoperitoneum. No upper abdominal free fluid. Aortoiliac calcified atherosclerosis noted. Ectatic infrarenal abdominal aorta measuring up to 27 mm diameter is stable since 2013. IMPRESSION: 1. 5 cm hypodense right lobe liver mass is new since 2013 and highly  suspicious for metastatic disease in this setting. 2. Bladder wall thickening. Bilateral hydronephrosis and hydroureter suggesting obstructive uropathy. On the left there is a distal ureteral calculus measuring 5 x 10 mm located about 1 cm from the left UVJ. No right side urologic calculus, consider obstruction due to bladder tumor. 3. Left greater than right pelvic sidewall lymphadenopathy, up to 10 mm short axis. 4. Trace pleural effusions. Right greater than left lung base atelectasis. Electronically Signed   By: Genevie Ann M.D.   On: 02/15/2015 16:51   Dg Chest 2 View  02/15/2015  CLINICAL DATA:  Increased weakness over the past few days. Worsening cough. Chest congestion. EXAM: CHEST  2 VIEW COMPARISON:  02/12/2015. FINDINGS: Poor inspiration. Enlarged cardiac silhouette with a mild increase in size. Chronically elevated right hemidiaphragm. Clear lungs. Small bilateral pleural effusions. Upper lumbar spine degenerative changes. IMPRESSION: 1. Small bilateral pleural effusions. 2. Mildly progressive cardiomegaly. 3. Stable chronically elevated right hemidiaphragm. Electronically Signed   By: Claudie Revering M.D.   On: 02/15/2015 10:48    Background 81 y.o. year-old WM with a background of RA on steroids, diverticular disease, GERD, PUD, PAF. He was diagnosed with a urothelial bladder cancer for which he was to undergo surgery at [facility] today. He presented to the ED yesterday with progressive weakness, malaise and a decline in UOP. Was found to have a  creatinine of 12, K >7.5. CT of the abdomen and pelvis showed bilateral hydronephrosis with ureteral dilation to the bladder and a left distal ureteral stone.   Impression/Plan 1. Acute renal failure with hyperkalemia - Hydro on CT (cancer + left ureteral stone) that was not present on [place] of 02/12/15. NO other culprits to implicate at this time re his AKI.  He will require HD for hyperkalemia, and I agree with Dr. Jeffie Pollock that bilateral perc tubes are really all we have to offer in terms of hopefully recovering some renal function - may or may not see improvement but not much else to offer. Recommend perc tubes, placement of HD  catheter, transfer to [facility] for dialysis pending any renal functional recovery. Pt and daughter agree. 2. Hyperkalemia - K >7.5 for over 24 hours despite kayexalate X 4 + other conservative measures 3. Urothelial bladder cancer - had been scheduled for palliative operation 10/26. Obviously on hold for now  4. RA - on steroids. Plaquenil on hold. Need to restart his prednisone at 5 mg/day. Jamal Maes,  MD [facility] [phone] pager 02/16/2015, 8:44 AM

## 2015-02-16 NOTE — Progress Notes (Signed)
CRITICAL VALUE ALERT  Critical value received:  K 6.5  Date of notification:  02/16/15  Time of notification:  14:35    Critical value read back:Yes.    Nurse who received alert:  Katherine Mantle  MD notified (1st page):  Broadus John  Time of first page:  [phone]  MD notified (2nd page):  Time of second page:  Responding MD:  Broadus John    Time MD responded:  [phone]

## 2015-02-16 NOTE — Progress Notes (Signed)
CRITICAL VALUE ALERT  Critical value received:  K >7.5  Date of notification:  02/16/2015  Time of notification:  0510  Critical value read back:Yes.    Nurse who received alert:  Reche Dixon  MD notified (1st page):  K Schorr  Time of first page:  [phone]  MD notified (2nd page):  Time of second page:  Responding MD:  Lamar Blinks   Time MD responded:  [phone]

## 2015-02-16 NOTE — Progress Notes (Addendum)
[facility] Addendum Note  Pt had a single perc tube placed around 11:30 AM (could not lie still for both sides) and since that time has made 1.5 liters of urine!!! Creatinine has dropped from 13.2 to 11.9 and K is now down to 6.5.  I think given the fairly dramatic increase in UOP along with the trend toward improvement in both K and creatinine that we can hold off on HD - will hold the transfer for now, repeat labs at [facility] intervals - he may not require HD at all (which was my hope).  Jamal Maes, MD [facility] [phone] Pager 02/16/2015, 3:13 PM

## 2015-02-16 NOTE — Care Management Note (Signed)
Case Management Note  Patient Details  Name: Craig Waters. MRN: [id number] Date of Birth: [birth year]  Subjective/Objective:       sirs             Action/Plan:Date: February 16, 2015 Chart reviewed for concurrent status and case management needs. Will continue to follow patient for changes and needs: Velva Harman, RN, BSN, [place]   [phone]   Expected Discharge Date:   (unknown)               Expected Discharge Plan:  Home/Self Care  In-House Referral:  NA  Discharge planning Services  CM Consult  Post Acute Care Choice:  NA Choice offered to:  NA  DME Arranged:    DME Agency:     HH Arranged:    HH Agency:     Status of Service:  In process, will continue to follow  Medicare Important Message Given:    Date Medicare IM Given:    Medicare IM give by:    Date Additional Medicare IM Given:    Additional Medicare Important Message give by:     If discussed at [place] of Stay Meetings, dates discussed:    Additional Comments:  Leeroy Cha, RN 02/16/2015, 9:50 AM

## 2015-02-16 NOTE — Progress Notes (Signed)
[facility] PROGRESS NOTE  Craig Waters. UVO:[id number] DOB: 02/25/1931 DOA: 02/15/2015 PCP: [name], MD  Assessment/Plan: 1. AKI -suspect progressive since March'16, due to progressive obstruction and poor Po intake -Urine output very poor, 75cc for > 12hours -Bilateral hydronephrosis on CT -Renal consulted-d/w Dr.Dunham , will see this am-plan for Tx to [facility] and HD urgently post HD cath and Perc Tubes today -IR consulted, d/w Dr.Wagner-Plan for Perc nephrostomy pending Renal input -D5W with Bicarb at 75cc/hr -Urology consulted and seen by Dr.Wrenn in ER, primarily followed by Urology at [facility]  2. Persistent Hyperkalemia -due to #1, s/p Ca gluconate, IV insulin/dextrose and Kayexalate x4 doses since last afternoon -no peak T waves or arrhthymias since admission -dont expect this to improve until HD and or relief of obstrcution  3. Bladder CA -currently under the care of Dr. [name] at [facility], he was notified of admission per EDP yesterday -He had a resection 07/07/14 and had a follow-up cystoscopy 11/16/14 showing persistent disease, was scheduled for cystoscopy, TURBT/ vaporization with possible intravesical epirubicin tomorrow   4. Uremic encephalopathy -Expect to improve with HD and relief of obstruction  5. H/o RA on steroids -being tapered, currently on 12.5mg  daily, continue same  DVT proph: Hep SQ-stopped, please re-order tomorrow  Code Status: Full Code Family Communication: none at bedside Disposition Plan: Tx to [facility]   Consultants:  Urology  Renal  IR Dr.Wagner   HPI/Subjective: K persistently up, given 4 rounds of meds  Objective: Filed Vitals:   02/16/15 0600  BP: 181/82  Pulse: 113  Temp:   Resp: 22    Intake/Output Summary (Last 24 hours) at 02/16/15 0738 Last data filed at 02/16/15 0603  Gross per 24 hour  Intake    561 ml  Output     75 ml  Net    486 ml   Filed Weights   02/15/15 2107  Weight: 101.6 kg (223  lb 15.8 oz)    Exam:   General:  Somnolent, arousible answers questions  Cardiovascular: S1S2/RRR  Respiratory: CTAB  Abdomen: soft, NT, BS present  Musculoskeletal: trace edema   Data Reviewed: Basic Metabolic Panel:  Recent Labs Lab 02/15/15 1206 02/15/15 1734 02/15/15 2216 02/16/15 0345  NA 137 138 138 138  K >7.5* >7.5* >7.5* >7.5*  CL 104 101 101 101  CO2 17* 22 22 20*  GLUCOSE 138* 141* 167* 179*  BUN 102* 125* 128* 125*  CREATININE 12.06* 12.97* 13.00* 13.22*  CALCIUM 9.0 9.4 9.3 9.3   Liver Function Tests: No results for input(s): AST, ALT, ALKPHOS, BILITOT, PROT, ALBUMIN in the last 168 hours. No results for input(s): LIPASE, AMYLASE in the last 168 hours. No results for input(s): AMMONIA in the last 168 hours. CBC:  Recent Labs Lab 02/15/15 1206  WBC 18.4*  NEUTROABS 15.8*  HGB 14.7  HCT 43.7  MCV 90.5  PLT 133*   Cardiac Enzymes:  Recent Labs Lab 02/15/15 1206  TROPONINI 0.12*   BNP (last 3 results)  Recent Labs  02/15/15 1206  BNP 522.9*    ProBNP (last 3 results) No results for input(s): PROBNP in the last 8760 hours.  CBG: No results for input(s): GLUCAP in the last 168 hours.  Recent Results (from the past 240 hour(s))  MRSA PCR Screening     Status: None   Collection Time: 02/15/15  7:54 PM  Result Value Ref Range Status   MRSA by PCR NEGATIVE NEGATIVE Final    Comment:  The GeneXpert MRSA Assay (FDA approved for NASAL specimens only), is one component of a comprehensive MRSA colonization surveillance program. It is not intended to diagnose MRSA infection nor to guide or monitor treatment for MRSA infections.      Studies: Ct Abdomen Pelvis Wo Contrast  02/15/2015  CLINICAL DATA:  81 year old male with abdominal pain and bloating since this morning. Recent dizziness. Acute renal failure. Current history of bladder cancer, gross hematuria. EXAM: CT ABDOMEN AND PELVIS WITHOUT CONTRAST TECHNIQUE:  Multidetector CT imaging of the abdomen and pelvis was performed following the standard protocol without IV contrast. COMPARISON:  Renal ultrasound 02/12/2015. Lumbar MRI 07/29/2014. CT Abdomen and Pelvis 08/23/2011. FINDINGS: Chronic elevation of the right hemidiaphragm. Right middle lobe and lower lobe atelectasis with some air bronchograms. Superimposed bibasilar dependent atelectasis. Small volume pleural effusions in both costophrenic angles. No pericardial effusion. Chronic degenerative changes in the spine. Chronic lumbar pars defects associated with L3-L4 anterolisthesis. No acute or suspicious osseous lesion identified. Abnormal bladder wall thickening with Foley catheter in place. Prostate surgery suspected since the 2013 comparison. No pelvic free fluid. Negative rectum. Pelvic sidewall lymphadenopathy greater on the left, up to 10 mm short axis. No inguinal lymphadenopathy. Redundant sigmoid colon with mild diverticulosis. Occasional diverticula in the left colon. Mild diverticula at the hepatic flexure. Negative right colon. Appendix not identified. There is trace right lower quadrant free fluid associated with left greater than right retroperitoneal and lateral Conal fascia stranding. See renal findings below. Most of the small bowel is decompressed. There are some gas-filled nondilated loops in the ventral abdomen. The stomach is distended with oral contrast. Negative duodenum. Noncontrast gallbladder, spleen, pancreas, and adrenal glands are within normal limits. There is a hypodense posterior right lobe liver mass approximating 5.4 cm diameter (series 2, image 32) Which is subtle in the absence of IV contrast. No other liver lesion identified. This is new since 2013. There is a 5 x 10 mm distal left ureteral stone located 10-15 mm from the left ureterovesical junction. There is associated left hydroureter and left greater than right periureteral stranding with mild left hydronephrosis. Likewise  there is mild right hydronephrosis and hydroureter, however, no right ureteral calculus is identified. No nephrolithiasis. Bilateral hypodense renal cysts have increased have mildly increased since 2013. No pneumoperitoneum. No upper abdominal free fluid. Aortoiliac calcified atherosclerosis noted. Ectatic infrarenal abdominal aorta measuring up to 27 mm diameter is stable since 2013. IMPRESSION: 1. 5 cm hypodense right lobe liver mass is new since 2013 and highly suspicious for metastatic disease in this setting. 2. Bladder wall thickening. Bilateral hydronephrosis and hydroureter suggesting obstructive uropathy. On the left there is a distal ureteral calculus measuring 5 x 10 mm located about 1 cm from the left UVJ. No right side urologic calculus, consider obstruction due to bladder tumor. 3. Left greater than right pelvic sidewall lymphadenopathy, up to 10 mm short axis. 4. Trace pleural effusions. Right greater than left lung base atelectasis. Electronically Signed   By: Genevie Ann M.D.   On: 02/15/2015 16:51   Dg Chest 2 View  02/15/2015  CLINICAL DATA:  Increased weakness over the past few days. Worsening cough. Chest congestion. EXAM: CHEST  2 VIEW COMPARISON:  02/12/2015. FINDINGS: Poor inspiration. Enlarged cardiac silhouette with a mild increase in size. Chronically elevated right hemidiaphragm. Clear lungs. Small bilateral pleural effusions. Upper lumbar spine degenerative changes. IMPRESSION: 1. Small bilateral pleural effusions. 2. Mildly progressive cardiomegaly. 3. Stable chronically elevated right hemidiaphragm. Electronically Signed  By: Claudie Revering M.D.   On: 02/15/2015 10:48    Scheduled Meds: . antiseptic oral rinse  7 mL Mouth Rinse q12n4p  . chlorhexidine  15 mL Mouth Rinse BID  . heparin  5,000 Units Subcutaneous 3 times per day  . sodium chloride  3 mL Intravenous Q12H  . sodium chloride  3 mL Intravenous Q12H   Continuous Infusions: .  sodium bicarbonate  infusion 1000 mL 75  mL/hr at 02/16/15 0544   Antibiotics Given (last 72 hours)    None      Principal Problem:   Acute renal failure (ARF) (HCC) Active Problems:   Encephalopathy acute   HTN (hypertension)   Urothelial carcinoma (HCC)   Rheumatoid arthritis (HCC)   Hyperkalemia   Metabolic acidosis   Obstructive uropathy   Bilateral hydronephrosis   Left nephrolithiasis    Time spent: 66min    Craig Waters  [facility] Pager [phone]. If 7PM-7AM, please contact night-coverage at [email or web address], password [facility] 02/16/2015, 7:38 AM  LOS: 1 day

## 2015-02-16 NOTE — Procedures (Signed)
Interventional Radiology Procedure Note  Procedure: Placement of a left nephrostomy tube.   Findings:  Decompressed kidneys, with minimal pelvi-caliectasis by [place].  Complications: None Recommendations:  - Drainage to bag - Right PCN not attempted.  Patient becoming agitated and unable to continue prone position.   - Observe urine clearance  Signed,  Dulcy Fanny. Earleen Newport, DO

## 2015-02-16 NOTE — Consult Note (Signed)
Chief Complaint: Acute renal failure, dizziness, mental status changes.   Referring Physician(s): Dr. Hilbert Bible  History of Present Illness: Craig Waters. is a 81 y.o. male admitted through the ED to [facility] ICU with weakness, dizziness, and feeling ill.  Admission labs show acute renal failure, and he has no history of dialysis.    Non-contrast enhanced CT performed on admission shows bilateral kidney pelvicaliectasis, although there is no severe hydronephrosis, and there is no severe ureteral dilation.  He does have a distal left ureteral stone, but there is no asymmetry in the degree of mild dilation comparing left and right.    Labs show an elevated potassium and creatinine, which are new.  His potassium has not responded to medical therapy.  One  Troponin has been drawn yesterday, with a slight elevation of .12.  BNP is elevated.  He has a leukocytosis.    In review of his labs, I see that currently there is no method to calculate a FENA.   Urology has seen the patient in consultation on this admission, hospitalist team is managing his care, and his primary physician is Dr Felipa Eth.    Past Medical History  Diagnosis Date  . Blood in stool   . Diverticulitis   . GERD (gastroesophageal reflux disease)   . Glaucoma   . Ulcer   . History of transfusion of whole blood   . Prostate disorder   . Shortness of breath 10-18-11    with exertion, cardiac cath done 7-8 yrs ago negative.  . Vertigo 10-18-11    inner ear issues occ. -tx. Bonine as needed  . Arthritis 10-18-11    back, fingers  . H/O hiatal hernia 10-18-11    noted on recent CXR  . Adenomatous colon polyp   . Urothelial carcinoma ([place]) 05/2014  . PAF (paroxysmal atrial fibrillation) ([place])   . Rheumatoid arthritis ([place])   . Discitis of thoracic region 07/04/2012    Past Surgical History  Procedure Laterality Date  . Gastric ulcer  10-18-11    '91-blleding ulcer with blood transfusions after  .  Appendectomy  10-18-11    age 75  . Cataract extraction, bilateral  10-18-11    bilateral   . Eye surgery  10-18-11    laser eye surgery s/p cataract bilaterally  . Cardiac catheterization  10-18-11    7-8 yrs ago-mild blockage,no tx. indicated  . Prostatectomy  10/27/2011    Procedure: PROSTATECTOMY SUPRAPUBIC;  Surgeon: Ailene Rud, MD;  Location: [facility] ORS;  Service: Urology;  Laterality: N/A;  Placement of suprapubic tube  . Cystoscopy  10/27/2011    Procedure: CYSTOSCOPY FLEXIBLE;  Surgeon: Ailene Rud, MD;  Location: [facility] ORS;  Service: Urology;  Laterality: N/A;  . Transurethral resection of bladder tumor with gyrus (turbt-gyrus)  06/2014    T1 HG tumor.  [facility].    Allergies: Other  Medications: Prior to Admission medications   Medication Sig Start Date End Date Taking? Authorizing Provider  acetaminophen (TYLENOL) 650 MG CR tablet Take 1,300 mg by mouth every 6 (six) hours as needed for pain.   Yes Historical Provider, MD  acetaminophen-codeine (TYLENOL #3) 300-30 MG tablet Take 1 tablet by mouth daily as needed for moderate pain.    Yes Historical Provider, MD  b complex vitamins tablet Take 1 tablet by mouth daily.   Yes Historical Provider, MD  Calcium Citrate-Vitamin D (CALCIUM + D PO) Take 1 tablet by mouth daily.   Yes  Historical Provider, MD  diltiazem (TIAZAC) 180 MG 24 hr capsule Take 180 mg by mouth daily.   Yes Historical Provider, MD  hydroxychloroquine (PLAQUENIL) 200 MG tablet Take 400 mg by mouth daily.   Yes Historical Provider, MD  hyoscyamine (LEVSIN SL) 0.125 MG SL tablet Take 0.125 mg by mouth every 4 (four) hours as needed (bladder spasms).  02/01/15  Yes Historical Provider, MD  Multiple Vitamin (MULTIVITAMIN WITH MINERALS) TABS tablet Take 1 tablet by mouth daily. 05/15/14  Yes Erline Hau, MD  polyethylene glycol powder (GLYCOLAX/MIRALAX) powder Take 255 g by mouth daily. Patient taking differently: Take 17 g by mouth daily.   11/27/14  Yes Willia Craze, NP  predniSONE (DELTASONE) 5 MG tablet Take 5-12.5 mg by mouth daily with breakfast. 10-18 to 10-31 12.5 mg, 11-1 to  11-14 10 mg, 11-15 to 11-29 7.5 mg, 11-30 to 12-13 5 mg then back to the MD for further directions.   Yes Historical Provider, MD  traMADol (ULTRAM) 50 MG tablet Take 50 mg by mouth every 6 (six) hours as needed for moderate pain.   Yes Historical Provider, MD     Family History  Problem Relation Age of Onset  . Breast cancer Sister   . Dementia Mother   . Heart attack Father   . Colon cancer Neg Hx     Social History   Social History  . Marital Status: Married    Spouse Name: N/A  . Number of Children: N/A  . Years of Education: N/A   Occupational History  . [employer] Employ[employer]   Social History Main Topics  . Smoking status: Former Smoker -- 1.00 packs/day for 5 years    Types: Cigarettes    Quit date: 09/26/1978  . Smokeless tobacco: Current User    Types: Chew    Last Attempt to Quit: 11/01/2011  . Alcohol Use: No  . Drug Use: No  . Sexual Activity: Not Asked   Other Topics Concern  . None   Social History Narrative   Married, one daughter. One cup of coffee a day. He is semiretired.    ECOG Status: 1 - Symptomatic but completely ambulatory  Review of Systems: A 12 point ROS discussed and pertinent positives are indicated in the HPI above.  All other systems are negative.  Review of Systems  Vital Signs: BP 181/82 mmHg  Pulse 113  Temp(Src) 98.2 F (36.8 C) (Oral)  Resp 22  Ht 5\' 11"  (1.803 m)  Wt 223 lb 15.8 oz (101.6 kg)  BMI 31.25 kg/m2  SpO2 94%  Physical Exam  Normocephalic, Atraumatic Mucous membranes moist,pink.  Sinus tachycardia.  Uncomfortable in bed.  Oriented to person, place, and month.  Did not answer the correct day or date.  Correct year. No labored breathing, though he is tachypneic. Foley catheter. No swelling of his extremities.  Obese abdomen, with no  guarding.   Mallampati Score:  2  Imaging: Ct Abdomen Pelvis Wo Contrast  02/15/2015  CLINICAL DATA:  81 year old male with abdominal pain and bloating since this morning. Recent dizziness. Acute renal failure. Current history of bladder cancer, gross hematuria. EXAM: CT ABDOMEN AND PELVIS WITHOUT CONTRAST TECHNIQUE: Multidetector CT imaging of the abdomen and pelvis was performed following the standard protocol without IV contrast. COMPARISON:  Renal ultrasound 02/12/2015. Lumbar MRI 07/29/2014. CT Abdomen and Pelvis 08/23/2011. FINDINGS: Chronic elevation of the right hemidiaphragm. Right middle lobe and lower lobe atelectasis with some air bronchograms.  Superimposed bibasilar dependent atelectasis. Small volume pleural effusions in both costophrenic angles. No pericardial effusion. Chronic degenerative changes in the spine. Chronic lumbar pars defects associated with L3-L4 anterolisthesis. No acute or suspicious osseous lesion identified. Abnormal bladder wall thickening with Foley catheter in place. Prostate surgery suspected since the 2013 comparison. No pelvic free fluid. Negative rectum. Pelvic sidewall lymphadenopathy greater on the left, up to 10 mm short axis. No inguinal lymphadenopathy. Redundant sigmoid colon with mild diverticulosis. Occasional diverticula in the left colon. Mild diverticula at the hepatic flexure. Negative right colon. Appendix not identified. There is trace right lower quadrant free fluid associated with left greater than right retroperitoneal and lateral Conal fascia stranding. See renal findings below. Most of the small bowel is decompressed. There are some gas-filled nondilated loops in the ventral abdomen. The stomach is distended with oral contrast. Negative duodenum. Noncontrast gallbladder, spleen, pancreas, and adrenal glands are within normal limits. There is a hypodense posterior right lobe liver mass approximating 5.4 cm diameter (series 2, image 32) Which is  subtle in the absence of IV contrast. No other liver lesion identified. This is new since 2013. There is a 5 x 10 mm distal left ureteral stone located 10-15 mm from the left ureterovesical junction. There is associated left hydroureter and left greater than right periureteral stranding with mild left hydronephrosis. Likewise there is mild right hydronephrosis and hydroureter, however, no right ureteral calculus is identified. No nephrolithiasis. Bilateral hypodense renal cysts have increased have mildly increased since 2013. No pneumoperitoneum. No upper abdominal free fluid. Aortoiliac calcified atherosclerosis noted. Ectatic infrarenal abdominal aorta measuring up to 27 mm diameter is stable since 2013. IMPRESSION: 1. 5 cm hypodense right lobe liver mass is new since 2013 and highly suspicious for metastatic disease in this setting. 2. Bladder wall thickening. Bilateral hydronephrosis and hydroureter suggesting obstructive uropathy. On the left there is a distal ureteral calculus measuring 5 x 10 mm located about 1 cm from the left UVJ. No right side urologic calculus, consider obstruction due to bladder tumor. 3. Left greater than right pelvic sidewall lymphadenopathy, up to 10 mm short axis. 4. Trace pleural effusions. Right greater than left lung base atelectasis. Electronically Signed   By: Genevie Ann M.D.   On: 02/15/2015 16:51   Dg Chest 2 View  02/15/2015  CLINICAL DATA:  Increased weakness over the past few days. Worsening cough. Chest congestion. EXAM: CHEST  2 VIEW COMPARISON:  02/12/2015. FINDINGS: Poor inspiration. Enlarged cardiac silhouette with a mild increase in size. Chronically elevated right hemidiaphragm. Clear lungs. Small bilateral pleural effusions. Upper lumbar spine degenerative changes. IMPRESSION: 1. Small bilateral pleural effusions. 2. Mildly progressive cardiomegaly. 3. Stable chronically elevated right hemidiaphragm. Electronically Signed   By: Claudie Revering M.D.   On: 02/15/2015  10:48   US Renal  02/12/2015  CLINICAL DATA:  Bladder cancer.  Hematuria. EXAM: RENAL / URINARY TRACT ULTRASOUND COMPLETE COMPARISON:  08/23/2011 FINDINGS: Right Kidney: Length: 14.3 cm. Multiple simple cysts. Largest is in the interpolar region measuring 5.7 cm. No hydronephrosis. Left Kidney: Length: 12.7 cm. Central hypoechoic area in the upper pole of the left kidney measures 1.9 x 1.7 x 1.5 cm. Additional hypoechoic exophytic lesion in the interpolar region measures 1.6 x 1.4 x 1.8 cm. These are both indeterminate. No hydronephrosis. Bladder: Appears normal for degree of bladder distention. IMPRESSION: Simple cysts in the right kidney are stable. No hydronephrosis. There are 2 new lesions in the left kidney which are indeterminate. MRI with and  without contrast can be performed to further delineate. Electronically Signed   By: Marybelle Killings M.D.   On: 02/12/2015 14:24    Labs:  CBC:  Recent Labs  05/14/14 2041 05/15/14 0637 02/15/15 1206  WBC 15.0* 10.5 18.4*  HGB 14.4 12.2* 14.7  HCT 43.4 38.1* 43.7  PLT 438* 321 133*    COAGS: No results for input(s): INR, APTT in the last 8760 hours.  BMP:  Recent Labs  02/15/15 1206 02/15/15 1734 02/15/15 2216 02/16/15 0345  NA 137 138 138 138  K >7.5* >7.5* >7.5* >7.5*  CL 104 101 101 101  CO2 17* 22 22 20*  GLUCOSE 138* 141* 167* 179*  BUN 102* 125* 128* 125*  CALCIUM 9.0 9.4 9.3 9.3  CREATININE 12.06* 12.97* 13.00* 13.22*  GFRNONAA 3* 3* 3* 3*  GFRAA 4* 4* 4* 3*    LIVER FUNCTION TESTS:  Recent Labs  05/14/14 2110  BILITOT 1.0  AST 45*  ALT 41  ALKPHOS 92  PROT 6.3  ALBUMIN 2.3*    TUMOR MARKERS: No results for input(s): AFPTM, CEA, CA199, CHROMGRNA in the last 8760 hours.  Assessment and Plan:  81 year old gentleman with admission for acute renal failure, and hyperkalemia.  Currently has no arrhythmia, with sinus tachycardia.   VIR has been consulted regarding acute renal failure, possible obstructive  etiology, and possible need for urinary diversion with bilateral nephrostomy.   CT shows only mild hydronephrosis, and a distal left ureteral stone. Recorded urine output since admission is ~150cc of urine, much less than expected ~Liter/day.  Given the only mild degree of hydronephrosis, my impression is that not only would there be increased risk for placement of bilateral nephrostomy, but that urinary diversion may not be addressing associated etiology, whether pre-renal or renal in nature.    Currently, pending labs include INR, urine sodium, and urine creatinine (for FENA calculation).    I have discussed a possible nephrology consult with Dr. Hilbert Bible, and have also discussed the care plan with Dr. Jeffie Pollock.   VIR will follow up on renal recommendations, and if diversion is required, we will need to reassess his mental status to determine if moderate sedation or anesthesia will be required for a safe procedure.   Continue NPO.    Thank you for this interesting consult.  I greatly enjoyed meeting Ceaser Ebeling. and look forward to participating in their care.    SignedCorrie Mckusick 02/16/2015, 6:56 AM   I spent a total of 40 Minutes    in face to face in clinical consultation, greater than 50% of which was counseling/coordinating care for acute renal failure, possible obstruction as etiology, possible bilateral nephrostomy tube placement.

## 2015-02-16 NOTE — Progress Notes (Signed)
Patient ID: Craig Waters., male   DOB: 31-Oct-1930, 81 y.o.   MRN: [id number]    Subjective: [name] reports no new complaints.  He has had about 73ml of UOP in the last shift.   His Cr continues to rise and his potassium remains elevated despite kayexalate, glucose and bicarb.  IR is seeing him this morning for tube placement and a nephrology consult is pending.     ROS:  Review of Systems  Constitutional: Negative for fever.  Gastrointestinal: Negative for abdominal pain.    Anti-infectives: Anti-infectives    None      Current Facility-Administered Medications  Medication Dose Route Frequency Provider Last Rate Last Dose  . 0.9 %  sodium chloride infusion  250 mL Intravenous PRN Venetia Maxon Rama, MD      . antiseptic oral rinse (CPC / CETYLPYRIDINIUM CHLORIDE 0.05%) solution 7 mL  7 mL Mouth Rinse q12n4p Christina P Rama, MD      . chlorhexidine (PERIDEX) 0.12 % solution 15 mL  15 mL Mouth Rinse BID Christina P Rama, MD      . heparin injection 5,000 Units  5,000 Units Subcutaneous 3 times per day Venetia Maxon Rama, MD   5,000 Units at 02/15/15 2114  . morphine 2 MG/ML injection 2 mg  2 mg Intravenous Q2H PRN Venetia Maxon Rama, MD   2 mg at 02/16/15 0338  . ondansetron (ZOFRAN) tablet 4 mg  4 mg Oral Q6H PRN Venetia Maxon Rama, MD       Or  . ondansetron (ZOFRAN) injection 4 mg  4 mg Intravenous Q6H PRN Venetia Maxon Rama, MD   4 mg at 02/16/15 0601  . sodium bicarbonate 150 mEq in dextrose 5 % 1,000 mL infusion   Intravenous Continuous Rhetta Mura Schorr, NP 75 mL/hr at 02/16/15 0544    . sodium chloride 0.9 % injection 3 mL  3 mL Intravenous Q12H Venetia Maxon Rama, MD   3 mL at 02/15/15 2112  . sodium chloride 0.9 % injection 3 mL  3 mL Intravenous Q12H Venetia Maxon Rama, MD   3 mL at 02/15/15 2112  . sodium chloride 0.9 % injection 3 mL  3 mL Intravenous PRN Venetia Maxon Rama, MD         Objective: Vital signs in last 24 hours: Temp:  [97.9 F (36.6 C)-98.8 F (37.1 C)] 98.2 F  (36.8 C) (10/25 0400) Pulse Rate:  [94-118] 113 (10/25 0600) Resp:  [14-23] 22 (10/25 0600) BP: (145-188)/(77-100) 181/82 mmHg (10/25 0600) SpO2:  [92 %-100 %] 94 % (10/25 0600) Weight:  [101.6 kg (223 lb 15.8 oz)] 101.6 kg (223 lb 15.8 oz) (10/24 2107)  Intake/Output from previous day: 10/24 0701 - 10/25 0700 In: 561 [I.V.:341; IV Piggyback:220] Out: 75 [Urine:75] Intake/Output this shift:     Physical Exam  Constitutional: He is well-developed, well-nourished, and in no distress.  Cardiovascular: Regular rhythm.  Tachycardia present.   Pulmonary/Chest: Effort normal.  Genitourinary:  Foley is draining light pink urine.     Lab Results:   Recent Labs  02/15/15 1206  WBC 18.4*  HGB 14.7  HCT 43.7  PLT 133*   BMET  Recent Labs  02/15/15 2216 02/16/15 0345  NA 138 138  K >7.5* >7.5*  CL 101 101  CO2 22 20*  GLUCOSE 167* 179*  BUN 128* 125*  CREATININE 13.00* 13.22*  CALCIUM 9.3 9.3   PT/INR No results for input(s): LABPROT, INR in the last 72 hours. ABG No  results for input(s): PHART, HCO3 in the last 72 hours.  Invalid input(s): PCO2, PO2  Studies/Results: Ct Abdomen Pelvis Wo Contrast  02/15/2015  CLINICAL DATA:  81 year old male with abdominal pain and bloating since this morning. Recent dizziness. Acute renal failure. Current history of bladder cancer, gross hematuria. EXAM: CT ABDOMEN AND PELVIS WITHOUT CONTRAST TECHNIQUE: Multidetector CT imaging of the abdomen and pelvis was performed following the standard protocol without IV contrast. COMPARISON:  Renal ultrasound 02/12/2015. Lumbar MRI 07/29/2014. CT Abdomen and Pelvis 08/23/2011. FINDINGS: Chronic elevation of the right hemidiaphragm. Right middle lobe and lower lobe atelectasis with some air bronchograms. Superimposed bibasilar dependent atelectasis. Small volume pleural effusions in both costophrenic angles. No pericardial effusion. Chronic degenerative changes in the spine. Chronic lumbar pars  defects associated with L3-L4 anterolisthesis. No acute or suspicious osseous lesion identified. Abnormal bladder wall thickening with Foley catheter in place. Prostate surgery suspected since the 2013 comparison. No pelvic free fluid. Negative rectum. Pelvic sidewall lymphadenopathy greater on the left, up to 10 mm short axis. No inguinal lymphadenopathy. Redundant sigmoid colon with mild diverticulosis. Occasional diverticula in the left colon. Mild diverticula at the hepatic flexure. Negative right colon. Appendix not identified. There is trace right lower quadrant free fluid associated with left greater than right retroperitoneal and lateral Conal fascia stranding. See renal findings below. Most of the small bowel is decompressed. There are some gas-filled nondilated loops in the ventral abdomen. The stomach is distended with oral contrast. Negative duodenum. Noncontrast gallbladder, spleen, pancreas, and adrenal glands are within normal limits. There is a hypodense posterior right lobe liver mass approximating 5.4 cm diameter (series 2, image 32) Which is subtle in the absence of IV contrast. No other liver lesion identified. This is new since 2013. There is a 5 x 10 mm distal left ureteral stone located 10-15 mm from the left ureterovesical junction. There is associated left hydroureter and left greater than right periureteral stranding with mild left hydronephrosis. Likewise there is mild right hydronephrosis and hydroureter, however, no right ureteral calculus is identified. No nephrolithiasis. Bilateral hypodense renal cysts have increased have mildly increased since 2013. No pneumoperitoneum. No upper abdominal free fluid. Aortoiliac calcified atherosclerosis noted. Ectatic infrarenal abdominal aorta measuring up to 27 mm diameter is stable since 2013. IMPRESSION: 1. 5 cm hypodense right lobe liver mass is new since 2013 and highly suspicious for metastatic disease in this setting. 2. Bladder wall  thickening. Bilateral hydronephrosis and hydroureter suggesting obstructive uropathy. On the left there is a distal ureteral calculus measuring 5 x 10 mm located about 1 cm from the left UVJ. No right side urologic calculus, consider obstruction due to bladder tumor. 3. Left greater than right pelvic sidewall lymphadenopathy, up to 10 mm short axis. 4. Trace pleural effusions. Right greater than left lung base atelectasis. Electronically Signed   By: Genevie Ann M.D.   On: 02/15/2015 16:51   Dg Chest 2 View  02/15/2015  CLINICAL DATA:  Increased weakness over the past few days. Worsening cough. Chest congestion. EXAM: CHEST  2 VIEW COMPARISON:  02/12/2015. FINDINGS: Poor inspiration. Enlarged cardiac silhouette with a mild increase in size. Chronically elevated right hemidiaphragm. Clear lungs. Small bilateral pleural effusions. Upper lumbar spine degenerative changes. IMPRESSION: 1. Small bilateral pleural effusions. 2. Mildly progressive cardiomegaly. 3. Stable chronically elevated right hemidiaphragm. Electronically Signed   By: Claudie Revering M.D.   On: 02/15/2015 10:48     Assessment and Plan: His renal function continues to worsen and his potassium  remains quite high.   He has some urine output and with the limited amount of hydronephrosis it is most probable that his ARI is from both obstruction and possible ATN.  I still feel that nephrostomy tube placement is indicated with the presence of the bilateral hydro. .        LOS: 1 day    Malka So 02/16/2015 [phone]

## 2015-02-16 NOTE — Progress Notes (Addendum)
Follow-up:   Rec'd page from RN regarding K+ of > 7.5. Pt w/ persistent hyperkalemia since admission as result of ARF related to metastatic bladder cancer, bil hydronephrosis and distal (L) ureteral stone. Pt has at this point rec'd 3 doses each of IV insulin, D50 and calcium gluconate w/ Kayexalate 60 gms. K+ remains > 7.5. Bicarb is 20, Ag is 17. As of 7124 on 02/15/15 there had been no EKG changes associated w/ pt's hyperkalemia. Output remains minimal (60cc this shift). Urology and nephrology were consulted per MD note, however nephrology as not seen pt. Urology did see pt and recommended placement of bilateral nephrostomy tubes. Discussed pt w/ Dr Marval Regal w/ renal service who recommended speaking w/ IR to see how soon they could could mobilized IR team to place nephrostomy tubes. He also agreed w/ repeating IV D50, insulin and calcium w/ Kayexalate. Will also add bicarb qtt (3 amps in D5W at 75 cc/hr). Spoke w/ Dr Earleen Newport w/ IR team who recommended stat CBC and PT/INR. Plan is to place nephrostomy tubes via IR as soon as possible this am. CBC and PT/INR ordered stat. Will add repeat EKG. Pt noted w/ mild tachycardia (110-114), remaining VSS and pt is afebrile. Will continue to monitor closely in SDU.  Jeryl Columbia, NP-C [facility] Pager [phone]   02/16/15  0700:  Spoke w/ Dr Earleen Newport w/ IR who has evaluated pt and feels pre-renal causes for ARF should be r/o before he proceeds w/ placement of bil nephrostomy tubes. Requesting renal service evaluate pt prior to procedure. He will send urine for FENa calculation. CBC and PT/INR pending. Discussed pt w/ Dr Broadus John who will contact renal service this am.

## 2015-02-17 ENCOUNTER — Inpatient Hospital Stay (HOSPITAL_COMMUNITY): Payer: Medicare Other

## 2015-02-17 DIAGNOSIS — E875 Hyperkalemia: Secondary | ICD-10-CM

## 2015-02-17 DIAGNOSIS — N1330 Unspecified hydronephrosis: Secondary | ICD-10-CM

## 2015-02-17 DIAGNOSIS — I4891 Unspecified atrial fibrillation: Secondary | ICD-10-CM

## 2015-02-17 DIAGNOSIS — N139 Obstructive and reflux uropathy, unspecified: Secondary | ICD-10-CM

## 2015-02-17 DIAGNOSIS — G9340 Encephalopathy, unspecified: Secondary | ICD-10-CM

## 2015-02-17 LAB — RENAL FUNCTION PANEL
ANION GAP: 10 (ref 5–15)
Albumin: 2.6 g/dL — ABNORMAL LOW (ref 3.5–5.0)
Albumin: 2.4 g/dL — ABNORMAL LOW (ref 3.5–5.0)
Anion gap: 9 (ref 5–15)
BUN: 81 mg/dL — ABNORMAL HIGH (ref 6–20)
BUN: 76 mg/dL — ABNORMAL HIGH (ref 6–20)
CALCIUM: 8.2 mg/dL — AB (ref 8.9–10.3)
CHLORIDE: 104 mmol/L (ref 101–111)
CHLORIDE: 105 mmol/L (ref 101–111)
CO2: 28 mmol/L (ref 22–32)
CO2: 30 mmol/L (ref 22–32)
Calcium: 8.1 mg/dL — ABNORMAL LOW (ref 8.9–10.3)
Creatinine, Ser: 7.24 mg/dL — ABNORMAL HIGH (ref 0.61–1.24)
Creatinine, Ser: 6.36 mg/dL — ABNORMAL HIGH (ref 0.61–1.24)
GFR calc non Af Amer: 7 mL/min — ABNORMAL LOW (ref >60)
GFR, EST AFRICAN AMERICAN: 7 mL/min — AB (ref >60)
GFR, EST AFRICAN AMERICAN: 8 mL/min — AB (ref >60)
GFR, EST NON AFRICAN AMERICAN: 6 mL/min — AB (ref >60)
GLUCOSE: 180 mg/dL — AB (ref 65–99)
GLUCOSE: 185 mg/dL — AB (ref 65–99)
POTASSIUM: 4.2 mmol/L (ref 3.5–5.1)
Phosphorus: 5.5 mg/dL — ABNORMAL HIGH (ref 2.5–4.6)
Phosphorus: 5.6 mg/dL — ABNORMAL HIGH (ref 2.5–4.6)
Potassium: 4.5 mmol/L (ref 3.5–5.1)
SODIUM: 141 mmol/L (ref 135–145)
Sodium: 145 mmol/L (ref 135–145)

## 2015-02-17 LAB — MAGNESIUM: Magnesium: 2.2 mg/dL (ref 1.7–2.4)

## 2015-02-17 LAB — CBC
HCT: 38 % — ABNORMAL LOW (ref 39.0–52.0)
HEMOGLOBIN: 12.3 g/dL — AB (ref 13.0–17.0)
MCH: 30.2 pg (ref 26.0–34.0)
MCHC: 32.4 g/dL (ref 30.0–36.0)
MCV: 93.4 fL (ref 78.0–100.0)
Platelets: 117 10*3/uL — ABNORMAL LOW (ref 150–400)
RBC: 4.07 MIL/uL — AB (ref 4.22–5.81)
RDW: 15.6 % — ABNORMAL HIGH (ref 11.5–15.5)
WBC: 9.8 10*3/uL (ref 4.0–10.5)

## 2015-02-17 MED ORDER — DEXTROSE-NACL 5-0.45 % IV SOLN
INTRAVENOUS | Status: DC
  Filled 2015-02-17: qty 1000

## 2015-02-17 MED ORDER — SIMETHICONE 80 MG PO CHEW
80.0000 mg | CHEWABLE_TABLET | Freq: Four times a day (QID) | ORAL | Status: DC | PRN
  Administered 2015-02-17 – 2015-02-19 (×2): 80 mg via ORAL
  Filled 2015-02-17 (×4): qty 1

## 2015-02-17 MED ORDER — DEXTROSE-NACL 5-0.45 % IV SOLN
INTRAVENOUS | Status: DC
  Administered 2015-02-17: 14:00:00 via INTRAVENOUS
  Administered 2015-02-18: 1000 mL via INTRAVENOUS
  Administered 2015-02-19 – 2015-02-21 (×2): via INTRAVENOUS

## 2015-02-17 MED ORDER — LABETALOL HCL 5 MG/ML IV SOLN
5.0000 mg | INTRAVENOUS | Status: DC | PRN
  Administered 2015-02-17: 5 mg via INTRAVENOUS
  Filled 2015-02-17: qty 4

## 2015-02-17 MED ORDER — METOPROLOL SUCCINATE ER 25 MG PO TB24
12.5000 mg | ORAL_TABLET | Freq: Every day | ORAL | Status: DC
  Administered 2015-02-17 – 2015-02-18 (×2): 12.5 mg via ORAL
  Filled 2015-02-17 (×2): qty 1

## 2015-02-17 MED ORDER — METOPROLOL TARTRATE 1 MG/ML IV SOLN
2.5000 mg | Freq: Once | INTRAVENOUS | Status: AC
  Administered 2015-02-17: 2.5 mg via INTRAVENOUS
  Filled 2015-02-17: qty 5

## 2015-02-17 MED ORDER — HEPARIN SODIUM (PORCINE) 5000 UNIT/ML IJ SOLN
5000.0000 [IU] | Freq: Three times a day (TID) | INTRAMUSCULAR | Status: DC
  Administered 2015-02-17 – 2015-02-18 (×3): 5000 [IU] via SUBCUTANEOUS
  Filled 2015-02-17 (×3): qty 1

## 2015-02-17 NOTE — Progress Notes (Signed)
Date: February 17, 2015 Chart reviewed for concurrent status and case management needs. Will continue to follow patient for changes and needs: Rhonda Davis, RN, BSN, CCM   [phone] 

## 2015-02-17 NOTE — Progress Notes (Signed)
  Echocardiogram 2D Echocardiogram has been performed.  Jennette Dubin 02/17/2015, 4:31 PM

## 2015-02-17 NOTE — Progress Notes (Signed)
Patient's HR was sustaining in the 130's at rest. Pt. Asymptomatic. HR had also been elevated during the day. Patient HR began to increase and sustain in the 150's, with increasing once to 174 bpm. Mid-level with [facility] was paged and notified. New orders were written. Intervention decreased HR to low 100's to 110's.

## 2015-02-17 NOTE — Progress Notes (Signed)
Subjective: The patient reports more alert this AM post L perc nephrostomy. Problems: 1. Acute renal failure:  Bilateral hydro, mild, L distal stone by CT, 29mmx5mm , 1 cm from the L U-V junction, now with L perc nephrostomy in place.  Cr this Am 7.24,  K=4.5, dialysis cancelled. I/O= neg 1700+ 2. Ureteral stone: as above. Will hold off on plan for extraction for now.  3. Bladder cancer: Incomplete resection at [facility]. Scheduled to return to [facility] this week for re-resection.      CT shows probable mets.      Discussed briefly with pt: He/family will eventually need to decide to go back to [facility] or have continued Urology/Oncology evaluation and Rx here.   Objective: Vital signs in last 24 hours: Temp:  [97.3 F (36.3 C)-99.6 F (37.6 C)] 98.7 F (37.1 C) (10/26 0400) Pulse Rate:  [47-126] 119 (10/25 2300) Resp:  [12-23] 19 (10/26 0600) BP: (96-184)/(36-116) 117/36 mmHg (10/26 0600) SpO2:  [92 %-98 %] 93 % (10/26 0600)A  Intake/Output from previous day: 10/25 0701 - 10/26 0700 In: 1750 [I.V.:1745] Out: 3675 [Urine:3675] Intake/Output this shift: Total I/O In: 845 [I.V.:845] Out: 1175 [Urine:1175]  Past Medical History  Diagnosis Date  . Blood in stool   . Diverticulitis   . GERD (gastroesophageal reflux disease)   . Glaucoma   . Ulcer   . History of transfusion of whole blood   . Prostate disorder   . Shortness of breath 10-18-11    with exertion, cardiac cath done 7-8 yrs ago negative.  . Vertigo 10-18-11    inner ear issues occ. -tx. Bonine as needed  . Arthritis 10-18-11    back, fingers  . H/O hiatal hernia 10-18-11    noted on recent CXR  . Adenomatous colon polyp   . Urothelial carcinoma ([place]) 05/2014  . PAF (paroxysmal atrial fibrillation) ([place])   . Rheumatoid arthritis ([place])   . Discitis of thoracic region 07/04/2012    Physical Exam:  Lungs - Normal respiratory effort, chest expands symmetrically.  Abdomen - Soft, non-tender & non-distended.  Lab  Results:  Recent Labs  02/15/15 1206 02/16/15 0735 02/17/15 0225  WBC 18.4* 16.3* 9.8  HGB 14.7 14.2 12.3*  HCT 43.7 42.7 38.0*   BMET  Recent Labs  02/16/15 1756 02/17/15 0225  NA 140 141  K 6.1* 4.5  CL 102 104  CO2 24 28  GLUCOSE 136* 180*  BUN 100* 81*  CREATININE 10.12* 7.24*  CALCIUM 8.7* 8.1*   No results for input(s): LABURIN in the last 72 hours. Results for orders placed or performed during the hospital encounter of 02/15/15  MRSA PCR Screening     Status: None   Collection Time: 02/15/15  7:54 PM  Result Value Ref Range Status   MRSA by PCR NEGATIVE NEGATIVE Final    Comment:        The GeneXpert MRSA Assay (FDA approved for NASAL specimens only), is one component of a comprehensive MRSA colonization surveillance program. It is not intended to diagnose MRSA infection nor to guide or monitor treatment for MRSA infections.     Studies/Results: Ir Fluoro Guide Cv Line Right  02/16/2015  CLINICAL DATA:  81 year old male admitted with acute renal failure. He has been referred for evaluation for hemodialysis catheter placement. Catheter placement is indicated. EXAM: IR RIGHT FLOURO GUIDE CV LINE; IR ULTRASOUND GUIDANCE VASC ACCESS RIGHT Date: 02/16/2015 ANESTHESIA/SEDATION: Moderate (conscious) sedation was administered during this procedure. A total of 0.5 mg Versed  and 25 mg Fentanyl were administered intravenously. The patient's vital signs were monitored continuously by radiology nursing throughout the course of the procedure. Total sedation time: 11 minutes FLUOROSCOPY TIME:  18 seconds TECHNIQUE: The procedure, risks, benefits, and alternatives were explained to the patient. Questions regarding the procedure were encouraged and answered. The patient understands and consents to the procedure. The right neck and chest was prepped with chlorhexidine, and draped in the usual sterile fashion using maximum barrier technique (cap and mask, sterile gown, sterile  gloves, large sterile sheet, hand hygiene and cutaneous antiseptic). Local anesthesia was attained by infiltration with 1% lidocaine with epinephrine. Ultrasound demonstrated patency of the right internal jugular vein, and this was documented with an image. Under real-time ultrasound guidance, this vein was accessed with a 21 gauge micropuncture needle and image documentation was performed. A small dermatotomy was made at the access site with an 11 scalpel. A 0.018" wire was advanced into the SVC and the access needle exchanged for a 45F micropuncture vascular sheath. The 0.018" wire was then removed and a 0.035" wire advanced into the IVC. Dilation of the tissue tract was performed with a 10 French tissue dilator. A 20 cm triple-lumen trialysis catheter was then placed using Seldinger technique, with the tip terminating at the superior cavoatrial junction. Wire was removed.  The catheter was sutured in position. Patient tolerated the procedure well and remained hemodynamically stable throughout. No complications were encountered and no significant blood loss was encountered. COMPLICATIONS: None IMPRESSION: Status post placement of right IJ temporary hemodialysis catheter. Catheter ready for use. Signed, Dulcy Fanny. Earleen Newport, DO [facility] Radiology Electronically Signed   By: Corrie Mckusick D.O.   On: 02/16/2015 13:16   Ir US Guide Vasc Access Right  02/16/2015  CLINICAL DATA:  81 year old male admitted with acute renal failure. He has been referred for evaluation for hemodialysis catheter placement. Catheter placement is indicated. EXAM: IR RIGHT FLOURO GUIDE CV LINE; IR ULTRASOUND GUIDANCE VASC ACCESS RIGHT Date: 02/16/2015 ANESTHESIA/SEDATION: Moderate (conscious) sedation was administered during this procedure. A total of 0.5 mg Versed and 25 mg Fentanyl were administered intravenously. The patient's vital signs were monitored continuously by radiology nursing  throughout the course of the procedure. Total sedation time: 11 minutes FLUOROSCOPY TIME:  18 seconds TECHNIQUE: The procedure, risks, benefits, and alternatives were explained to the patient. Questions regarding the procedure were encouraged and answered. The patient understands and consents to the procedure. The right neck and chest was prepped with chlorhexidine, and draped in the usual sterile fashion using maximum barrier technique (cap and mask, sterile gown, sterile gloves, large sterile sheet, hand hygiene and cutaneous antiseptic). Local anesthesia was attained by infiltration with 1% lidocaine with epinephrine. Ultrasound demonstrated patency of the right internal jugular vein, and this was documented with an image. Under real-time ultrasound guidance, this vein was accessed with a 21 gauge micropuncture needle and image documentation was performed. A small dermatotomy was made at the access site with an 11 scalpel. A 0.018" wire was advanced into the SVC and the access needle exchanged for a 45F micropuncture vascular sheath. The 0.018" wire was then removed and a 0.035" wire advanced into the IVC. Dilation of the tissue tract was performed with a 10 French tissue dilator. A 20 cm triple-lumen trialysis catheter was then placed using Seldinger technique, with the tip terminating at the superior cavoatrial junction. Wire was removed.  The catheter was sutured in position. Patient tolerated the procedure well and  remained hemodynamically stable throughout. No complications were encountered and no significant blood loss was encountered. COMPLICATIONS: None IMPRESSION: Status post placement of right IJ temporary hemodialysis catheter. Catheter ready for use. Signed, Dulcy Fanny. Earleen Newport, DO [facility] Radiology Electronically Signed   By: Corrie Mckusick D.O.   On: 02/16/2015 13:16   Ir Nephrostomy Placement Left  02/16/2015  CLINICAL DATA:  81 year old male  admitted with acute renal failure. Patient never has had dialysis. He has evidence on imaging of mild pelvicaliectasis with no frank hydronephrosis. CT demonstrates a distal left ureteral stone and minimal ureteral dilation. He has been referred for evaluation for urinary diversion, as a component of acute renal failure may be related to an obstruction. Drain catheter placement is indicated. EXAM: IR NEPHROSTOMY PLACEMENT LEFT COMPARISON:  None. ANESTHESIA/SEDATION: 2.0 IV Versed; 25 mcg IV Fentanyl. Total Moderate Sedation Time: 72 minutes. CONTRAST:  45mL OMNIPAQUE IOHEXOL 300 MG/ML  SOLN MEDICATIONS: 400 mg IV Cipro FLUOROSCOPY TIME:  12 minutes, 36 seconds TECHNIQUE: The procedure, risks, benefits, and alternatives were explained to the patient and the patient's family. Questions regarding the procedure were encouraged and answered. The patient understands and consents to the procedure. The bilateral flanks were prepped with chlorhexidine in a sterile fashion, and a sterile drape was applied covering the operative field. A sterile gown and sterile gloves were used for the procedure. Local anesthesia was provided with 1% Lidocaine. Ultrasound survey of both the left and right kidney was performed with images stored and sent to PACs. Left: Left kidney was then identified, without significant hydronephrosis. Ultrasound guidance was used to direct a 22 gauge coaxial needle into the interpolar region of the kidney. Once the tip of the needle was withdrawn into the collecting system, this access was used to opacify the renal collecting system with contrast. A double stick technique was then used after opacifying the collecting system to select a lower pole posterior calyx for a second stick. A 22 gauge coaxial needle was then directed under fluoroscopy into this lower pole posterior calyx. A Nitrex wire was navigated through the infundibulum and collecting system into the ureter, and then using modified Seldinger  technique, an Accustick system, and a 10 [place] dilator, a 10 French pigtail drainage catheter was placed into the renal pelvis. Near the conclusion of successful placement of the left-sided percutaneous nephrostomy tube, the patient was becoming restless and agitated. Further attempt at a right-sided percutaneous nephrostomy tube was deemed unsafe. Patient remained hemodynamically stable throughout. No complications encountered. COMPLICATIONS: None FINDINGS: Ultrasound survey of the left kidney demonstrates minimal pelvicaliectasis. Ultrasound survey of the right kidney demonstrates no hydronephrosis, and no significant pelvicaliectasis. Images during the case demonstrate placement of left-sided percutaneous nephrostomy tube into a posterior inferior calyx. No significant hydronephrosis with infusion of contrast. IMPRESSION: Status post placement of left-sided percutaneous nephrostomy tube. Signed, Dulcy Fanny. Earleen Newport, DO [facility] Radiology PLAN: Given that the patient's agitation and discomfort precluded safe attempt at placement of a right-sided percutaneous nephrostomy tube, further attempts may be reconsidered after interval medical therapy. Observation of urine clearance from the left percutaneous nephrostomy tube. Electronically Signed   By: Corrie Mckusick D.O.   On: 02/16/2015 13:29   Ir Nephrostomy Placement Right  02/16/2015  CLINICAL DATA:  81 year old male admitted with acute renal failure. Patient never has had dialysis. He has evidence on imaging of mild pelvicaliectasis with no frank hydronephrosis. CT demonstrates a distal left ureteral stone and minimal ureteral  dilation. He has been referred for evaluation for urinary diversion, as a component of acute renal failure may be related to an obstruction. Drain catheter placement is indicated. EXAM: IR NEPHROSTOMY PLACEMENT LEFT COMPARISON:  None. ANESTHESIA/SEDATION: 2.0 IV Versed; 25 mcg IV Fentanyl.  Total Moderate Sedation Time: 72 minutes. CONTRAST:  53mL OMNIPAQUE IOHEXOL 300 MG/ML  SOLN MEDICATIONS: 400 mg IV Cipro FLUOROSCOPY TIME:  12 minutes, 36 seconds TECHNIQUE: The procedure, risks, benefits, and alternatives were explained to the patient and the patient's family. Questions regarding the procedure were encouraged and answered. The patient understands and consents to the procedure. The bilateral flanks were prepped with chlorhexidine in a sterile fashion, and a sterile drape was applied covering the operative field. A sterile gown and sterile gloves were used for the procedure. Local anesthesia was provided with 1% Lidocaine. Ultrasound survey of both the left and right kidney was performed with images stored and sent to PACs. Left: Left kidney was then identified, without significant hydronephrosis. Ultrasound guidance was used to direct a 22 gauge coaxial needle into the interpolar region of the kidney. Once the tip of the needle was withdrawn into the collecting system, this access was used to opacify the renal collecting system with contrast. A double stick technique was then used after opacifying the collecting system to select a lower pole posterior calyx for a second stick. A 22 gauge coaxial needle was then directed under fluoroscopy into this lower pole posterior calyx. A Nitrex wire was navigated through the infundibulum and collecting system into the ureter, and then using modified Seldinger technique, an Accustick system, and a 10 [place] dilator, a 10 French pigtail drainage catheter was placed into the renal pelvis. Near the conclusion of successful placement of the left-sided percutaneous nephrostomy tube, the patient was becoming restless and agitated. Further attempt at a right-sided percutaneous nephrostomy tube was deemed unsafe. Patient remained hemodynamically stable throughout. No complications encountered. COMPLICATIONS: None FINDINGS: Ultrasound survey of the left kidney  demonstrates minimal pelvicaliectasis. Ultrasound survey of the right kidney demonstrates no hydronephrosis, and no significant pelvicaliectasis. Images during the case demonstrate placement of left-sided percutaneous nephrostomy tube into a posterior inferior calyx. No significant hydronephrosis with infusion of contrast. IMPRESSION: Status post placement of left-sided percutaneous nephrostomy tube. Signed, Dulcy Fanny. Earleen Newport, DO [facility] Radiology PLAN: Given that the patient's agitation and discomfort precluded safe attempt at placement of a right-sided percutaneous nephrostomy tube, further attempts may be reconsidered after interval medical therapy. Observation of urine clearance from the left percutaneous nephrostomy tube. Electronically Signed   By: Corrie Mckusick D.O.   On: 02/16/2015 13:29    CT night of Admission:  CLINICAL DATA: 81 year old male with abdominal pain and bloating since this morning. Recent dizziness. Acute renal failure. Current history of bladder cancer, gross hematuria.  EXAM: CT ABDOMEN AND PELVIS WITHOUT CONTRAST  TECHNIQUE: Multidetector CT imaging of the abdomen and pelvis was performed following the standard protocol without IV contrast.  COMPARISON: Renal ultrasound 02/12/2015. Lumbar MRI 07/29/2014. CT Abdomen and Pelvis 08/23/2011.  FINDINGS: Chronic elevation of the right hemidiaphragm. Right middle lobe and lower lobe atelectasis with some air bronchograms. Superimposed bibasilar dependent atelectasis. Small volume pleural effusions in both costophrenic angles. No pericardial effusion.  Chronic degenerative changes in the spine. Chronic lumbar pars defects associated with L3-L4 anterolisthesis. No acute or suspicious osseous lesion identified.  Abnormal bladder wall thickening with Foley catheter in place. Prostate surgery suspected since the 2013 comparison. No pelvic free fluid.  Negative  rectum. Pelvic sidewall lymphadenopathy greater on the left, up to 10 mm short axis. No inguinal lymphadenopathy.  Redundant sigmoid colon with mild diverticulosis. Occasional diverticula in the left colon. Mild diverticula at the hepatic flexure. Negative right colon. Appendix not identified. There is trace right lower quadrant free fluid associated with left greater than right retroperitoneal and lateral Conal fascia stranding. See renal findings below. Most of the small bowel is decompressed. There are some gas-filled nondilated loops in the ventral abdomen. The stomach is distended with oral contrast. Negative duodenum.  Noncontrast gallbladder, spleen, pancreas, and adrenal glands are within normal limits.  There is a hypodense posterior right lobe liver mass approximating 5.4 cm diameter (series 2, image 32) Which is subtle in the absence of IV contrast. No other liver lesion identified. This is new since 2013.  There is a 5 x 10 mm distal left ureteral stone located 10-15 mm from the left ureterovesical junction. There is associated left hydroureter and left greater than right periureteral stranding with mild left hydronephrosis. Likewise there is mild right hydronephrosis and hydroureter, however, no right ureteral calculus is identified. No nephrolithiasis.  Bilateral hypodense renal cysts have increased have mildly increased since 2013.  No pneumoperitoneum. No upper abdominal free fluid. Aortoiliac calcified atherosclerosis noted. Ectatic infrarenal abdominal aorta measuring up to 27 mm diameter is stable since 2013.  IMPRESSION: 1. 5 cm hypodense right lobe liver mass is new since 2013 and highly suspicious for metastatic disease in this setting. 2. Bladder wall thickening. Bilateral hydronephrosis and hydroureter suggesting obstructive uropathy. On the left there is a distal ureteral calculus measuring 5 x 10 mm located about 1 cm from the left UVJ. No  right side urologic calculus, consider obstruction due to bladder tumor. 3. Left greater than right pelvic sidewall lymphadenopathy, up to 10 mm short axis. 4. Trace pleural effusions. Right greater than left lung base atelectasis.   Electronically Signed  By: Genevie Ann M.D.  On: 02/15/2015 16:51 Assessment: Resolving Acute renal failure with L perc nephrostomy, with underlying ureteral stone obstruction on the L side. ? Right renal function. Eventually needs lasix renal scan for function when Cr. Reaches nadir. Also, he may need Oncology evaluation, unless he decides to return to [facility], where he will be able to have Oncology services.   Plan: Follow Acute renal failure resolution for now.   Ailene Rud  MD 02/17/2015, 6:21 AM

## 2015-02-17 NOTE — Progress Notes (Signed)
[facility] Rounding Note  Subjective: Feels much better Had left PCN placed yesterday with 3.6 liter UOP since then Because creatinine and potassium were both falling we cancelled his transfer to [facility] He has avoided the need for dialysis Still has not had intervention on the right - Urology note mentions lasix renal scan when creatinine reaches "nadir" "?why not just re-image and if persistent hydro perc the other side?  Some issues with AFF past 24 hours  Objective: Physical examination BP 110/53 mmHg  Pulse 78  Temp(Src) 97.8 F (36.6 C) (Oral)  Resp 20  Ht 5\' 11"  (1.803 m)  Wt 101.6 kg (223 lb 15.8 oz)  BMI 31.25 kg/m2  SpO2 92% Gen: Large framed pale app WM - much more awake and alert than yesterday and quite happy that dialysis has been avoided    Skin: no rash, cyanosis Neck: no JVD Right IJ temp cath in place Chest: Grossly clear Heart: Tachy irregular Abdomen: soft, obese, protuberant, no focal tenderness Ext: Trace LE edema Neuro: Awake, alert. No asterixus or myoclonus today. Cherry colored urine from PCN tube.  Creatinine trending is shown for reference CREATININE, SER  Date/Time Value Ref Range Status  02/17/2015 06:45 AM 6.36* 0.61 - 1.24 mg/dL Final  02/17/2015 02:25 AM 7.24* 0.61 - 1.24 mg/dL Final  02/16/2015 05:56 PM 10.12* 0.61 - 1.24 mg/dL Final  02/16/2015 03:33 PM 11.18* 0.61 - 1.24 mg/dL Final  02/16/2015 01:50 PM 11.99* 0.61 - 1.24 mg/dL Final  02/16/2015 03:45 AM 13.22* left PCN placed 0.61 - 1.24 mg/dL Final  02/15/2015 10:16 PM 13.00* 0.61 - 1.24 mg/dL Final  02/15/2015 05:34 PM 12.97* 0.61 - 1.24 mg/dL Final  02/15/2015 12:06 PM 12.06* 0.61 - 1.24 mg/dL Final  02/10/2015 3's  ([facility] pre-op labs)    07/23/2014 1.01    05/14/2014 08:41 PM 1.08 0.50 - 1.35 mg/dL Final    Inpatient medications: . antiseptic oral rinse  7 mL Mouth Rinse q12n4p  . chlorhexidine  15 mL Mouth Rinse BID  . metoprolol succinate  12.5 mg Oral Daily  .  predniSONE  10 mg Oral Q breakfast  . sodium chloride  3 mL Intravenous Q12H  . sodium chloride  3 mL Intravenous Q12H   .  sodium bicarbonate  infusion 1000 mL 75 mL/hr at 02/17/15 0904    Labs: Basic Metabolic Panel:  Recent Labs Lab 02/15/15 2216 02/16/15 0345 02/16/15 1350 02/16/15 1533 02/16/15 1756 02/17/15 0225 02/17/15 0645  NA 138 138 140 140 140 141 145  K >7.5* >7.5* 6.5* 5.9* 6.1* 4.5 4.2  CL 101 101 102 101 102 104 105  CO2 22 20* 25 26 24 28 30   GLUCOSE 167* 179* 136* 146* 136* 180* 185*  BUN 128* 125* 114* 111* 100* 81* 76*  CREATININE 13.00* 13.22* 11.99* 11.18* 10.12* 7.24* 6.36*  CALCIUM 9.3 9.3 9.0 8.8* 8.7* 8.1* 8.2*  PHOS  --   --   --   --  5.9* 5.5* 5.6*   Liver Function Tests:  Recent Labs Lab 02/16/15 1756 02/17/15 0225 02/17/15 0645  ALBUMIN 2.7* 2.6* 2.4*   Recent Labs Lab 02/15/15 1206 02/16/15 0735 02/17/15 0225  WBC 18.4* 16.3* 9.8  NEUTROABS 15.8*  --   --   HGB 14.7 14.2 12.3*  HCT 43.7 42.7 38.0*  MCV 90.5 91.6 93.4  PLT 133* 128* 117*    Xrays/Other Studies: Ct Abdomen Pelvis Wo Contrast  02/15/2015  CLINICAL DATA:  81 year old male with abdominal pain and bloating  since this morning. Recent dizziness. Acute renal failure. Current history of bladder cancer, gross hematuria. EXAM: CT ABDOMEN AND PELVIS WITHOUT CONTRAST TECHNIQUE: Multidetector CT imaging of the abdomen and pelvis was performed following the standard protocol without IV contrast. COMPARISON:  Renal ultrasound 02/12/2015. Lumbar MRI 07/29/2014. CT Abdomen and Pelvis 08/23/2011. FINDINGS: Chronic elevation of the right hemidiaphragm. Right middle lobe and lower lobe atelectasis with some air bronchograms. Superimposed bibasilar dependent atelectasis. Small volume pleural effusions in both costophrenic angles. No pericardial effusion. Chronic degenerative changes in the spine. Chronic lumbar pars defects associated with L3-L4 anterolisthesis. No acute or suspicious  osseous lesion identified. Abnormal bladder wall thickening with Foley catheter in place. Prostate surgery suspected since the 2013 comparison. No pelvic free fluid. Negative rectum. Pelvic sidewall lymphadenopathy greater on the left, up to 10 mm short axis. No inguinal lymphadenopathy. Redundant sigmoid colon with mild diverticulosis. Occasional diverticula in the left colon. Mild diverticula at the hepatic flexure. Negative right colon. Appendix not identified. There is trace right lower quadrant free fluid associated with left greater than right retroperitoneal and lateral Conal fascia stranding. See renal findings below. Most of the small bowel is decompressed. There are some gas-filled nondilated loops in the ventral abdomen. The stomach is distended with oral contrast. Negative duodenum. Noncontrast gallbladder, spleen, pancreas, and adrenal glands are within normal limits. There is a hypodense posterior right lobe liver mass approximating 5.4 cm diameter (series 2, image 32) Which is subtle in the absence of IV contrast. No other liver lesion identified. This is new since 2013. There is a 5 x 10 mm distal left ureteral stone located 10-15 mm from the left ureterovesical junction. There is associated left hydroureter and left greater than right periureteral stranding with mild left hydronephrosis. Likewise there is mild right hydronephrosis and hydroureter, however, no right ureteral calculus is identified. No nephrolithiasis. Bilateral hypodense renal cysts have increased have mildly increased since 2013. No pneumoperitoneum. No upper abdominal free fluid. Aortoiliac calcified atherosclerosis noted. Ectatic infrarenal abdominal aorta measuring up to 27 mm diameter is stable since 2013. IMPRESSION: 1. 5 cm hypodense right lobe liver mass is new since 2013 and highly suspicious for metastatic disease in this setting. 2. Bladder wall thickening. Bilateral hydronephrosis and hydroureter suggesting obstructive  uropathy. On the left there is a distal ureteral calculus measuring 5 x 10 mm located about 1 cm from the left UVJ. No right side urologic calculus, consider obstruction due to bladder tumor. 3. Left greater than right pelvic sidewall lymphadenopathy, up to 10 mm short axis. 4. Trace pleural effusions. Right greater than left lung base atelectasis. Electronically Signed   By: Genevie Ann M.D.   On: 02/15/2015 16:51   Ir Fluoro Guide Cv Line Right  02/16/2015  CLINICAL DATA:  81 year old male admitted with acute renal failure. He has been referred for evaluation for hemodialysis catheter placement. Catheter placement is indicated. EXAM: IR RIGHT FLOURO GUIDE CV LINE; IR ULTRASOUND GUIDANCE VASC ACCESS RIGHT Date: 02/16/2015 ANESTHESIA/SEDATION: Moderate (conscious) sedation was administered during this procedure. A total of 0.5 mg Versed and 25 mg Fentanyl were administered intravenously. The patient's vital signs were monitored continuously by radiology nursing throughout the course of the procedure. Total sedation time: 11 minutes FLUOROSCOPY TIME:  18 seconds TECHNIQUE: The procedure, risks, benefits, and alternatives were explained to the patient. Questions regarding the procedure were encouraged and answered. The patient understands and consents to the procedure. The right neck and chest was prepped with chlorhexidine, and draped in the  usual sterile fashion using maximum barrier technique (cap and mask, sterile gown, sterile gloves, large sterile sheet, hand hygiene and cutaneous antiseptic). Local anesthesia was attained by infiltration with 1% lidocaine with epinephrine. Ultrasound demonstrated patency of the right internal jugular vein, and this was documented with an image. Under real-time ultrasound guidance, this vein was accessed with a 21 gauge micropuncture needle and image documentation was performed. A small dermatotomy was made at the access site with an 11 scalpel. A 0.018" wire was advanced into  the SVC and the access needle exchanged for a 55F micropuncture vascular sheath. The 0.018" wire was then removed and a 0.035" wire advanced into the IVC. Dilation of the tissue tract was performed with a 10 French tissue dilator. A 20 cm triple-lumen trialysis catheter was then placed using Seldinger technique, with the tip terminating at the superior cavoatrial junction. Wire was removed.  The catheter was sutured in position. Patient tolerated the procedure well and remained hemodynamically stable throughout. No complications were encountered and no significant blood loss was encountered. COMPLICATIONS: None IMPRESSION: Status post placement of right IJ temporary hemodialysis catheter. Catheter ready for use. Signed, Dulcy Fanny. Earleen Newport, DO [facility] Electronically Signed   By: Corrie Mckusick D.O.   On: 02/16/2015 13:16   Ir US Guide Vasc Access Right  02/16/2015  CLINICAL DATA:  81 year old male admitted with acute renal failure. He has been referred for evaluation for hemodialysis catheter placement. Catheter placement is indicated. EXAM: IR RIGHT FLOURO GUIDE CV LINE; IR ULTRASOUND GUIDANCE VASC ACCESS RIGHT Date: 02/16/2015 ANESTHESIA/SEDATION: Moderate (conscious) sedation was administered during this procedure. A total of 0.5 mg Versed and 25 mg Fentanyl were administered intravenously. The patient's vital signs were monitored continuously by radiology nursing throughout the course of the procedure. Total sedation time: 11 minutes FLUOROSCOPY TIME:  18 seconds TECHNIQUE: The procedure, risks, benefits, and alternatives were explained to the patient. Questions regarding the procedure were encouraged and answered. The patient understands and consents to the procedure. The right neck and chest was prepped with chlorhexidine, and draped in the usual sterile fashion using maximum barrier technique (cap and mask, sterile gown, sterile gloves, large  sterile sheet, hand hygiene and cutaneous antiseptic). Local anesthesia was attained by infiltration with 1% lidocaine with epinephrine. Ultrasound demonstrated patency of the right internal jugular vein, and this was documented with an image. Under real-time ultrasound guidance, this vein was accessed with a 21 gauge micropuncture needle and image documentation was performed. A small dermatotomy was made at the access site with an 11 scalpel. A 0.018" wire was advanced into the SVC and the access needle exchanged for a 55F micropuncture vascular sheath. The 0.018" wire was then removed and a 0.035" wire advanced into the IVC. Dilation of the tissue tract was performed with a 10 French tissue dilator. A 20 cm triple-lumen trialysis catheter was then placed using Seldinger technique, with the tip terminating at the superior cavoatrial junction. Wire was removed.  The catheter was sutured in position. Patient tolerated the procedure well and remained hemodynamically stable throughout. No complications were encountered and no significant blood loss was encountered. COMPLICATIONS: None IMPRESSION: Status post placement of right IJ temporary hemodialysis catheter. Catheter ready for use. Signed, Dulcy Fanny. Earleen Newport, DO [facility] Radiology Electronically Signed   By: Corrie Mckusick D.O.   On: 02/16/2015 13:16   Ir Nephrostomy Placement Left  02/16/2015  CLINICAL DATA:  81 year old male admitted with acute  renal failure. Patient never has had dialysis. He has evidence on imaging of mild pelvicaliectasis with no frank hydronephrosis. CT demonstrates a distal left ureteral stone and minimal ureteral dilation. He has been referred for evaluation for urinary diversion, as a component of acute renal failure may be related to an obstruction. Drain catheter placement is indicated. EXAM: IR NEPHROSTOMY PLACEMENT LEFT COMPARISON:  None. ANESTHESIA/SEDATION: 2.0 IV Versed; 25 mcg IV  Fentanyl. Total Moderate Sedation Time: 72 minutes. CONTRAST:  62mL OMNIPAQUE IOHEXOL 300 MG/ML  SOLN MEDICATIONS: 400 mg IV Cipro FLUOROSCOPY TIME:  12 minutes, 36 seconds TECHNIQUE: The procedure, risks, benefits, and alternatives were explained to the patient and the patient's family. Questions regarding the procedure were encouraged and answered. The patient understands and consents to the procedure. The bilateral flanks were prepped with chlorhexidine in a sterile fashion, and a sterile drape was applied covering the operative field. A sterile gown and sterile gloves were used for the procedure. Local anesthesia was provided with 1% Lidocaine. Ultrasound survey of both the left and right kidney was performed with images stored and sent to PACs. Left: Left kidney was then identified, without significant hydronephrosis. Ultrasound guidance was used to direct a 22 gauge coaxial needle into the interpolar region of the kidney. Once the tip of the needle was withdrawn into the collecting system, this access was used to opacify the renal collecting system with contrast. A double stick technique was then used after opacifying the collecting system to select a lower pole posterior calyx for a second stick. A 22 gauge coaxial needle was then directed under fluoroscopy into this lower pole posterior calyx. A Nitrex wire was navigated through the infundibulum and collecting system into the ureter, and then using modified Seldinger technique, an Accustick system, and a 10 [place] dilator, a 10 French pigtail drainage catheter was placed into the renal pelvis. Near the conclusion of successful placement of the left-sided percutaneous nephrostomy tube, the patient was becoming restless and agitated. Further attempt at a right-sided percutaneous nephrostomy tube was deemed unsafe. Patient remained hemodynamically stable throughout. No complications encountered. COMPLICATIONS: None FINDINGS: Ultrasound survey of the left kidney  demonstrates minimal pelvicaliectasis. Ultrasound survey of the right kidney demonstrates no hydronephrosis, and no significant pelvicaliectasis. Images during the case demonstrate placement of left-sided percutaneous nephrostomy tube into a posterior inferior calyx. No significant hydronephrosis with infusion of contrast. IMPRESSION: Status post placement of left-sided percutaneous nephrostomy tube. Signed, Dulcy Fanny. Earleen Newport, DO [facility] Radiology PLAN: Given that the patient's agitation and discomfort precluded safe attempt at placement of a right-sided percutaneous nephrostomy tube, further attempts may be reconsidered after interval medical therapy. Observation of urine clearance from the left percutaneous nephrostomy tube. Electronically Signed   By: Corrie Mckusick D.O.   On: 02/16/2015 13:29   Ir Nephrostomy Placement Right  02/16/2015  CLINICAL DATA:  81 year old male admitted with acute renal failure. Patient never has had dialysis. He has evidence on imaging of mild pelvicaliectasis with no frank hydronephrosis. CT demonstrates a distal left ureteral stone and minimal ureteral dilation. He has been referred for evaluation for urinary diversion, as a component of acute renal failure may be related to an obstruction. Drain catheter placement is indicated. EXAM: IR NEPHROSTOMY PLACEMENT LEFT COMPARISON:  None. ANESTHESIA/SEDATION: 2.0 IV Versed; 25 mcg IV Fentanyl. Total Moderate Sedation Time: 72 minutes. CONTRAST:  27mL OMNIPAQUE IOHEXOL 300 MG/ML  SOLN MEDICATIONS: 400 mg IV Cipro FLUOROSCOPY TIME:  12 minutes, 36 seconds TECHNIQUE: The procedure,  risks, benefits, and alternatives were explained to the patient and the patient's family. Questions regarding the procedure were encouraged and answered. The patient understands and consents to the procedure. The bilateral flanks were prepped with chlorhexidine in a sterile fashion, and a sterile drape was  applied covering the operative field. A sterile gown and sterile gloves were used for the procedure. Local anesthesia was provided with 1% Lidocaine. Ultrasound survey of both the left and right kidney was performed with images stored and sent to PACs. Left: Left kidney was then identified, without significant hydronephrosis. Ultrasound guidance was used to direct a 22 gauge coaxial needle into the interpolar region of the kidney. Once the tip of the needle was withdrawn into the collecting system, this access was used to opacify the renal collecting system with contrast. A double stick technique was then used after opacifying the collecting system to select a lower pole posterior calyx for a second stick. A 22 gauge coaxial needle was then directed under fluoroscopy into this lower pole posterior calyx. A Nitrex wire was navigated through the infundibulum and collecting system into the ureter, and then using modified Seldinger technique, an Accustick system, and a 10 [place] dilator, a 10 French pigtail drainage catheter was placed into the renal pelvis. Near the conclusion of successful placement of the left-sided percutaneous nephrostomy tube, the patient was becoming restless and agitated. Further attempt at a right-sided percutaneous nephrostomy tube was deemed unsafe. Patient remained hemodynamically stable throughout. No complications encountered. COMPLICATIONS: None FINDINGS: Ultrasound survey of the left kidney demonstrates minimal pelvicaliectasis. Ultrasound survey of the right kidney demonstrates no hydronephrosis, and no significant pelvicaliectasis. Images during the case demonstrate placement of left-sided percutaneous nephrostomy tube into a posterior inferior calyx. No significant hydronephrosis with infusion of contrast. IMPRESSION: Status post placement of left-sided percutaneous nephrostomy tube. Signed, Dulcy Fanny. Earleen Newport, DO [facility] Radiology  PLAN: Given that the patient's agitation and discomfort precluded safe attempt at placement of a right-sided percutaneous nephrostomy tube, further attempts may be reconsidered after interval medical therapy. Observation of urine clearance from the left percutaneous nephrostomy tube. Electronically Signed   By: Corrie Mckusick D.O.   On: 02/16/2015 13:29   Background 80 y.o. year-old WM with a background of RA on steroids, diverticular disease, GERD, PUD, PAF. He was diagnosed with a urothelial bladder cancer for which he was to undergo surgery at [facility] today. He presented to the ED yesterday with progressive weakness, malaise and a decline in UOP. Was found to have a creatinine of 12, K >7.5. CT of the abdomen and pelvis showed bilateral hydronephrosis with ureteral dilation to the bladder and a left distal ureteral stone.  Impression/Plan 1. Acute renal failure with hyperkalemia - Hydro BILATERAL on CT (cancer + left ureteral stone) that was not present on [place] of 02/12/15. NO other culprits to implicate re: his AKI. Perc tube placed on the left (right not done d/t agitation) and renal function has improved dramatically over the last 24 hours despite reading of "minimal pelvocaliectasis" by radiology....  Have averted the need for dialysis. Still has not had intervention on the right - Urology note mentions "lasix renal scan when creatinine reaches nadir" ?Why not just re-image with CT and if persistent hydro perc the other side? Will defer decisions to Urology re further dealing with his obstruction. 2. Hyperkalemia - resolved 3. Metabolic acidosis - discontinue bicarb drip. Use plain D5 1/2NS 4. Urothelial bladder cancer - had been scheduled for palliative operation  10/26. Obviously on hold for now. Plans per Urology 5. RA - on steroids. Plaquenil on hold. On steroids.  His renal issues are entirely related to obstructive uropathy.  Left side has been decompressed. Will defer to Urology re the right side  as well as further urologic management of his bladder cancer. Nephrology will sign off. Please call if there are any additional questions we might assist with.  Jamal Maes,  MD [facility] [phone] pager 02/17/2015, 1:14 PM

## 2015-02-17 NOTE — Progress Notes (Signed)
Patient ID: Craig Waters., male   DOB: [birth year], 81 y.o.   MRN: [id number]         Subjective:  Pt feeling better overall; currently getting echocardiogram  Allergies: Other  Medications: Prior to Admission medications   Medication Sig Start Date End Date Taking? Authorizing Provider  acetaminophen (TYLENOL) 650 MG CR tablet Take 1,300 mg by mouth every 6 (six) hours as needed for pain.   Yes Historical Provider, MD  acetaminophen-codeine (TYLENOL #3) 300-30 MG tablet Take 1 tablet by mouth daily as needed for moderate pain.    Yes Historical Provider, MD  b complex vitamins tablet Take 1 tablet by mouth daily.   Yes Historical Provider, MD  Calcium Citrate-Vitamin D (CALCIUM + D PO) Take 1 tablet by mouth daily.   Yes Historical Provider, MD  diltiazem (TIAZAC) 180 MG 24 hr capsule Take 180 mg by mouth daily.   Yes Historical Provider, MD  hydroxychloroquine (PLAQUENIL) 200 MG tablet Take 400 mg by mouth daily.   Yes Historical Provider, MD  hyoscyamine (LEVSIN SL) 0.125 MG SL tablet Take 0.125 mg by mouth every 4 (four) hours as needed (bladder spasms).  02/01/15  Yes Historical Provider, MD  Multiple Vitamin (MULTIVITAMIN WITH MINERALS) TABS tablet Take 1 tablet by mouth daily. 05/15/14  Yes Erline Hau, MD  polyethylene glycol powder (GLYCOLAX/MIRALAX) powder Take 255 g by mouth daily. Patient taking differently: Take 17 g by mouth daily.  11/27/14  Yes Willia Craze, NP  predniSONE (DELTASONE) 5 MG tablet Take 5-12.5 mg by mouth daily with breakfast. 10-18 to 10-31 12.5 mg, 11-1 to  11-14 10 mg, 11-15 to 11-29 7.5 mg, 11-30 to 12-13 5 mg then back to the MD for further directions.   Yes Historical Provider, MD  traMADol (ULTRAM) 50 MG tablet Take 50 mg by mouth every 6 (six) hours as needed for moderate pain.   Yes Historical Provider, MD     Vital Signs: BP 113/47 mmHg  Pulse 86  Temp(Src) 99.2 F (37.3 C) (Oral)  Resp 13  Ht 5\' 11"  (1.803 m)  Wt 223  lb 15.8 oz (101.6 kg)  BMI 31.25 kg/m2  SpO2 95%  Physical Exam left PCN intact, output 875 cc blood-tinged urine; insertion site ok,NT; rt IJ cath ok  Imaging: Ct Abdomen Pelvis Wo Contrast  02/15/2015  CLINICAL DATA:  81 year old male with abdominal pain and bloating since this morning. Recent dizziness. Acute renal failure. Current history of bladder cancer, gross hematuria. EXAM: CT ABDOMEN AND PELVIS WITHOUT CONTRAST TECHNIQUE: Multidetector CT imaging of the abdomen and pelvis was performed following the standard protocol without IV contrast. COMPARISON:  Renal ultrasound 02/12/2015. Lumbar MRI 07/29/2014. CT Abdomen and Pelvis 08/23/2011. FINDINGS: Chronic elevation of the right hemidiaphragm. Right middle lobe and lower lobe atelectasis with some air bronchograms. Superimposed bibasilar dependent atelectasis. Small volume pleural effusions in both costophrenic angles. No pericardial effusion. Chronic degenerative changes in the spine. Chronic lumbar pars defects associated with L3-L4 anterolisthesis. No acute or suspicious osseous lesion identified. Abnormal bladder wall thickening with Foley catheter in place. Prostate surgery suspected since the 2013 comparison. No pelvic free fluid. Negative rectum. Pelvic sidewall lymphadenopathy greater on the left, up to 10 mm short axis. No inguinal lymphadenopathy. Redundant sigmoid colon with mild diverticulosis. Occasional diverticula in the left colon. Mild diverticula at the hepatic flexure. Negative right colon. Appendix not identified. There is trace right lower quadrant free fluid associated with left greater than right retroperitoneal and  lateral Conal fascia stranding. See renal findings below. Most of the small bowel is decompressed. There are some gas-filled nondilated loops in the ventral abdomen. The stomach is distended with oral contrast. Negative duodenum. Noncontrast gallbladder, spleen, pancreas, and adrenal glands are within normal limits.  There is a hypodense posterior right lobe liver mass approximating 5.4 cm diameter (series 2, image 32) Which is subtle in the absence of IV contrast. No other liver lesion identified. This is new since 2013. There is a 5 x 10 mm distal left ureteral stone located 10-15 mm from the left ureterovesical junction. There is associated left hydroureter and left greater than right periureteral stranding with mild left hydronephrosis. Likewise there is mild right hydronephrosis and hydroureter, however, no right ureteral calculus is identified. No nephrolithiasis. Bilateral hypodense renal cysts have increased have mildly increased since 2013. No pneumoperitoneum. No upper abdominal free fluid. Aortoiliac calcified atherosclerosis noted. Ectatic infrarenal abdominal aorta measuring up to 27 mm diameter is stable since 2013. IMPRESSION: 1. 5 cm hypodense right lobe liver mass is new since 2013 and highly suspicious for metastatic disease in this setting. 2. Bladder wall thickening. Bilateral hydronephrosis and hydroureter suggesting obstructive uropathy. On the left there is a distal ureteral calculus measuring 5 x 10 mm located about 1 cm from the left UVJ. No right side urologic calculus, consider obstruction due to bladder tumor. 3. Left greater than right pelvic sidewall lymphadenopathy, up to 10 mm short axis. 4. Trace pleural effusions. Right greater than left lung base atelectasis. Electronically Signed   By: Genevie Ann M.D.   On: 02/15/2015 16:51   Dg Chest 2 View  02/15/2015  CLINICAL DATA:  Increased weakness over the past few days. Worsening cough. Chest congestion. EXAM: CHEST  2 VIEW COMPARISON:  02/12/2015. FINDINGS: Poor inspiration. Enlarged cardiac silhouette with a mild increase in size. Chronically elevated right hemidiaphragm. Clear lungs. Small bilateral pleural effusions. Upper lumbar spine degenerative changes. IMPRESSION: 1. Small bilateral pleural effusions. 2. Mildly progressive cardiomegaly. 3.  Stable chronically elevated right hemidiaphragm. Electronically Signed   By: Claudie Revering M.D.   On: 02/15/2015 10:48   Ir Fluoro Guide Cv Line Right  02/16/2015  CLINICAL DATA:  81 year old male admitted with acute renal failure. He has been referred for evaluation for hemodialysis catheter placement. Catheter placement is indicated. EXAM: IR RIGHT FLOURO GUIDE CV LINE; IR ULTRASOUND GUIDANCE VASC ACCESS RIGHT Date: 02/16/2015 ANESTHESIA/SEDATION: Moderate (conscious) sedation was administered during this procedure. A total of 0.5 mg Versed and 25 mg Fentanyl were administered intravenously. The patient's vital signs were monitored continuously by radiology nursing throughout the course of the procedure. Total sedation time: 11 minutes FLUOROSCOPY TIME:  18 seconds TECHNIQUE: The procedure, risks, benefits, and alternatives were explained to the patient. Questions regarding the procedure were encouraged and answered. The patient understands and consents to the procedure. The right neck and chest was prepped with chlorhexidine, and draped in the usual sterile fashion using maximum barrier technique (cap and mask, sterile gown, sterile gloves, large sterile sheet, hand hygiene and cutaneous antiseptic). Local anesthesia was attained by infiltration with 1% lidocaine with epinephrine. Ultrasound demonstrated patency of the right internal jugular vein, and this was documented with an image. Under real-time ultrasound guidance, this vein was accessed with a 21 gauge micropuncture needle and image documentation was performed. A small dermatotomy was made at the access site with an 11 scalpel. A 0.018" wire was advanced into the SVC and the access needle exchanged for a 52F  micropuncture vascular sheath. The 0.018" wire was then removed and a 0.035" wire advanced into the IVC. Dilation of the tissue tract was performed with a 10 French tissue dilator. A 20 cm triple-lumen trialysis catheter was then placed using  Seldinger technique, with the tip terminating at the superior cavoatrial junction. Wire was removed.  The catheter was sutured in position. Patient tolerated the procedure well and remained hemodynamically stable throughout. No complications were encountered and no significant blood loss was encountered. COMPLICATIONS: None IMPRESSION: Status post placement of right IJ temporary hemodialysis catheter. Catheter ready for use. Signed, Dulcy Fanny. Earleen Newport, DO [facility] Electronically Signed   By: Corrie Mckusick D.O.   On: 02/16/2015 13:16   Ir US Guide Vasc Access Right  02/16/2015  CLINICAL DATA:  81 year old male admitted with acute renal failure. He has been referred for evaluation for hemodialysis catheter placement. Catheter placement is indicated. EXAM: IR RIGHT FLOURO GUIDE CV LINE; IR ULTRASOUND GUIDANCE VASC ACCESS RIGHT Date: 02/16/2015 ANESTHESIA/SEDATION: Moderate (conscious) sedation was administered during this procedure. A total of 0.5 mg Versed and 25 mg Fentanyl were administered intravenously. The patient's vital signs were monitored continuously by radiology nursing throughout the course of the procedure. Total sedation time: 11 minutes FLUOROSCOPY TIME:  18 seconds TECHNIQUE: The procedure, risks, benefits, and alternatives were explained to the patient. Questions regarding the procedure were encouraged and answered. The patient understands and consents to the procedure. The right neck and chest was prepped with chlorhexidine, and draped in the usual sterile fashion using maximum barrier technique (cap and mask, sterile gown, sterile gloves, large sterile sheet, hand hygiene and cutaneous antiseptic). Local anesthesia was attained by infiltration with 1% lidocaine with epinephrine. Ultrasound demonstrated patency of the right internal jugular vein, and this was documented with an image. Under real-time ultrasound guidance, this vein was  accessed with a 21 gauge micropuncture needle and image documentation was performed. A small dermatotomy was made at the access site with an 11 scalpel. A 0.018" wire was advanced into the SVC and the access needle exchanged for a 80F micropuncture vascular sheath. The 0.018" wire was then removed and a 0.035" wire advanced into the IVC. Dilation of the tissue tract was performed with a 10 French tissue dilator. A 20 cm triple-lumen trialysis catheter was then placed using Seldinger technique, with the tip terminating at the superior cavoatrial junction. Wire was removed.  The catheter was sutured in position. Patient tolerated the procedure well and remained hemodynamically stable throughout. No complications were encountered and no significant blood loss was encountered. COMPLICATIONS: None IMPRESSION: Status post placement of right IJ temporary hemodialysis catheter. Catheter ready for use. Signed, Dulcy Fanny. Earleen Newport, DO [facility] Radiology Electronically Signed   By: Corrie Mckusick D.O.   On: 02/16/2015 13:16   Ir Nephrostomy Placement Left  02/16/2015  CLINICAL DATA:  81 year old male admitted with acute renal failure. Patient never has had dialysis. He has evidence on imaging of mild pelvicaliectasis with no frank hydronephrosis. CT demonstrates a distal left ureteral stone and minimal ureteral dilation. He has been referred for evaluation for urinary diversion, as a component of acute renal failure may be related to an obstruction. Drain catheter placement is indicated. EXAM: IR NEPHROSTOMY PLACEMENT LEFT COMPARISON:  None. ANESTHESIA/SEDATION: 2.0 IV Versed; 25 mcg IV Fentanyl. Total Moderate Sedation Time: 72 minutes. CONTRAST:  53mL OMNIPAQUE IOHEXOL 300 MG/ML  SOLN MEDICATIONS: 400 mg IV Cipro FLUOROSCOPY TIME:  12  minutes, 36 seconds TECHNIQUE: The procedure, risks, benefits, and alternatives were explained to the patient and the patient's family.  Questions regarding the procedure were encouraged and answered. The patient understands and consents to the procedure. The bilateral flanks were prepped with chlorhexidine in a sterile fashion, and a sterile drape was applied covering the operative field. A sterile gown and sterile gloves were used for the procedure. Local anesthesia was provided with 1% Lidocaine. Ultrasound survey of both the left and right kidney was performed with images stored and sent to PACs. Left: Left kidney was then identified, without significant hydronephrosis. Ultrasound guidance was used to direct a 22 gauge coaxial needle into the interpolar region of the kidney. Once the tip of the needle was withdrawn into the collecting system, this access was used to opacify the renal collecting system with contrast. A double stick technique was then used after opacifying the collecting system to select a lower pole posterior calyx for a second stick. A 22 gauge coaxial needle was then directed under fluoroscopy into this lower pole posterior calyx. A Nitrex wire was navigated through the infundibulum and collecting system into the ureter, and then using modified Seldinger technique, an Accustick system, and a 10 [place] dilator, a 10 French pigtail drainage catheter was placed into the renal pelvis. Near the conclusion of successful placement of the left-sided percutaneous nephrostomy tube, the patient was becoming restless and agitated. Further attempt at a right-sided percutaneous nephrostomy tube was deemed unsafe. Patient remained hemodynamically stable throughout. No complications encountered. COMPLICATIONS: None FINDINGS: Ultrasound survey of the left kidney demonstrates minimal pelvicaliectasis. Ultrasound survey of the right kidney demonstrates no hydronephrosis, and no significant pelvicaliectasis. Images during the case demonstrate placement of left-sided percutaneous nephrostomy tube into a posterior inferior calyx. No significant  hydronephrosis with infusion of contrast. IMPRESSION: Status post placement of left-sided percutaneous nephrostomy tube. Signed, Dulcy Fanny. Earleen Newport, DO [facility] Radiology PLAN: Given that the patient's agitation and discomfort precluded safe attempt at placement of a right-sided percutaneous nephrostomy tube, further attempts may be reconsidered after interval medical therapy. Observation of urine clearance from the left percutaneous nephrostomy tube. Electronically Signed   By: Corrie Mckusick D.O.   On: 02/16/2015 13:29   Ir Nephrostomy Placement Right  02/16/2015  CLINICAL DATA:  81 year old male admitted with acute renal failure. Patient never has had dialysis. He has evidence on imaging of mild pelvicaliectasis with no frank hydronephrosis. CT demonstrates a distal left ureteral stone and minimal ureteral dilation. He has been referred for evaluation for urinary diversion, as a component of acute renal failure may be related to an obstruction. Drain catheter placement is indicated. EXAM: IR NEPHROSTOMY PLACEMENT LEFT COMPARISON:  None. ANESTHESIA/SEDATION: 2.0 IV Versed; 25 mcg IV Fentanyl. Total Moderate Sedation Time: 72 minutes. CONTRAST:  21mL OMNIPAQUE IOHEXOL 300 MG/ML  SOLN MEDICATIONS: 400 mg IV Cipro FLUOROSCOPY TIME:  12 minutes, 36 seconds TECHNIQUE: The procedure, risks, benefits, and alternatives were explained to the patient and the patient's family. Questions regarding the procedure were encouraged and answered. The patient understands and consents to the procedure. The bilateral flanks were prepped with chlorhexidine in a sterile fashion, and a sterile drape was applied covering the operative field. A sterile gown and sterile gloves were used for the procedure. Local anesthesia was provided with 1% Lidocaine. Ultrasound survey of both the left and right kidney was performed with images stored and sent to PACs. Left: Left kidney was then  identified, without significant hydronephrosis. Ultrasound  guidance was used to direct a 22 gauge coaxial needle into the interpolar region of the kidney. Once the tip of the needle was withdrawn into the collecting system, this access was used to opacify the renal collecting system with contrast. A double stick technique was then used after opacifying the collecting system to select a lower pole posterior calyx for a second stick. A 22 gauge coaxial needle was then directed under fluoroscopy into this lower pole posterior calyx. A Nitrex wire was navigated through the infundibulum and collecting system into the ureter, and then using modified Seldinger technique, an Accustick system, and a 10 [place] dilator, a 10 French pigtail drainage catheter was placed into the renal pelvis. Near the conclusion of successful placement of the left-sided percutaneous nephrostomy tube, the patient was becoming restless and agitated. Further attempt at a right-sided percutaneous nephrostomy tube was deemed unsafe. Patient remained hemodynamically stable throughout. No complications encountered. COMPLICATIONS: None FINDINGS: Ultrasound survey of the left kidney demonstrates minimal pelvicaliectasis. Ultrasound survey of the right kidney demonstrates no hydronephrosis, and no significant pelvicaliectasis. Images during the case demonstrate placement of left-sided percutaneous nephrostomy tube into a posterior inferior calyx. No significant hydronephrosis with infusion of contrast. IMPRESSION: Status post placement of left-sided percutaneous nephrostomy tube. Signed, Dulcy Fanny. Earleen Newport, DO [facility] Radiology PLAN: Given that the patient's agitation and discomfort precluded safe attempt at placement of a right-sided percutaneous nephrostomy tube, further attempts may be reconsidered after interval medical therapy. Observation of urine clearance from the left percutaneous nephrostomy  tube. Electronically Signed   By: Corrie Mckusick D.O.   On: 02/16/2015 13:29    Labs:  CBC:  Recent Labs  05/15/14 0637 02/15/15 1206 02/16/15 0735 02/17/15 0225  WBC 10.5 18.4* 16.3* 9.8  HGB 12.2* 14.7 14.2 12.3*  HCT 38.1* 43.7 42.7 38.0*  PLT 321 133* 128* 117*    COAGS:  Recent Labs  02/16/15 0735  INR 1.12    BMP:  Recent Labs  02/16/15 1533 02/16/15 1756 02/17/15 0225 02/17/15 0645  NA 140 140 141 145  K 5.9* 6.1* 4.5 4.2  CL 101 102 104 105  CO2 26 24 28 30   GLUCOSE 146* 136* 180* 185*  BUN 111* 100* 81* 76*  CALCIUM 8.8* 8.7* 8.1* 8.2*  CREATININE 11.18* 10.12* 7.24* 6.36*  GFRNONAA 4* 4* 6* 7*  GFRAA 4* 5* 7* 8*    LIVER FUNCTION TESTS:  Recent Labs  05/14/14 2110 02/16/15 1756 02/17/15 0225 02/17/15 0645  BILITOT 1.0  --   --   --   AST 45*  --   --   --   ALT 41  --   --   --   ALKPHOS 92  --   --   --   PROT 6.3  --   --   --   ALBUMIN 2.3* 2.7* 2.6* 2.4*    Assessment and Plan: S/p left PCN/rt IJ HD cath  10/25; temp 99; good output from left PCN; creat down to 6.36(7.24); minimal hydro noted with rt kidney yesterday; will await further input from urology regarding need for f/u imaging/need for rt PCN - lasix renal scan suggested following creat nadir per Dr. Gaynelle Arabian.   Signed: D. Rowe Robert 02/17/2015, 4:05 PM   I spent a total of 15 minutes at the the patient's bedside AND on the patient's hospital floor or unit, greater than 50% of which was counseling/coordinating care for left perc nephrostomy

## 2015-02-17 NOTE — Progress Notes (Signed)
[facility] PROGRESS NOTE  Craig Waters. UXN:[id number] DOB: 1931-02-10 DOA: 02/15/2015 PCP: [name], MD  HPI/Brief narrative Craig Waters. is an 81 y.o. male with a PMH of rheumatoid arthritis on chronic prednisone and other immunosuppressive therapy, high-grade urothelial cell carcinoma, under the care of Dr. [name] at [facility], who has scheduled him for a palliative surgical procedure for 02/17/15 but who presented with generalized weakness, failure to thrive, nausea, loss of appetite, and an unsteady gait. Pt was found to have Cr of 12 and K of 7.5  Assessment/Plan: 1. AKI -suspect progressive since March'16, due to progressive obstruction and poor Po intake -Urine output initially very poor but has since picked up markedly with 3.6L out over past 24hrs -Bilateral hydronephrosis on CT -Renal consulted and pt underwent HD cath and Perc Tubes placement on 10/25 -With improving renal function and increased urine output, pt has avoided transfer to [facility] for dialysis  2. Persistent Hyperkalemia -due to #1 -Resolved -no peak T waves or arrhthymias since admission  3. Bladder CA -currently under the care of Dr. [name] at [facility], he was notified of admission per EDP yesterday -Urology was consulted. Pt is s/p resection 07/07/14 and had a follow-up cystoscopy 11/16/14 showing persistent disease, was scheduled for cystoscopy, TURBT/ vaporization with possible intravesical epirubicin -Pt now s/p L nephrostomy placement, R could not be placed secondary to agitation -Urology now with recs to hold off on stone extraction and to f/u at [facility] for re-resection  4. Uremic encephalopathy -Seems improved  5. H/o RA on steroids -being tapered, currently on 10mg  daily, continue same  6. DVT proph: Hep SQ  7. Afib with RVR - HR into the 120-130's this AM - low dose beta blocker ordered - Lytes normal. Will check 2d echo - TSH on 05/15/14 w/in normal limits  Code  Status: Full Family Communication: Pt in room Disposition Plan: Pending   Consultants:  Nephrology  Urology  IR  Procedures:  HD cath placement 10/25  L Nephrostomy placement 10/25 (R side not done secondary to increased agitation)  Antibiotics: Anti-infectives    Start     Dose/Rate Route Frequency Ordered Stop   02/16/15 1030  ciprofloxacin (CIPRO) IVPB 400 mg     400 mg 200 mL/hr over 60 Minutes Intravenous  Once 02/16/15 0931 02/16/15 1052   02/16/15 0932  ciprofloxacin (CIPRO) 400 MG/200ML IVPB    Comments:  Margaretmary Dys   : cabinet override      02/16/15 0932 02/16/15 0950      HPI/Subjective: Reports feeling better today. Denies sob  Objective: Filed Vitals:   02/17/15 1000 02/17/15 1100 02/17/15 1200 02/17/15 1300  BP: 101/51 89/55 79/62  105/46  Pulse: 62 77 63 89  Temp:   99.2 F (37.3 C)   TempSrc:   Oral   Resp: 11 19 13 15   Height:      Weight:      SpO2: 93% 96% 94% 95%    Intake/Output Summary (Last 24 hours) at 02/17/15 1432 Last data filed at 02/17/15 1200  Gross per 24 hour  Intake   1675 ml  Output   3300 ml  Net  -1625 ml   Filed Weights   02/15/15 2107  Weight: 101.6 kg (223 lb 15.8 oz)    Exam:   General:  Awake, in nad  Cardiovascular: regular, s1, s2  Respiratory: normal resp effort, no wheezing  Abdomen: soft,nondistended  Musculoskeletal: perfused, no clubbing   Data Reviewed: Basic Metabolic Panel:  Recent Labs Lab 02/16/15 1350 02/16/15 1533 02/16/15 1756 02/17/15 0225 02/17/15 0645  NA 140 140 140 141 145  K 6.5* 5.9* 6.1* 4.5 4.2  CL 102 101 102 104 105  CO2 25 26 24 28 30   GLUCOSE 136* 146* 136* 180* 185*  BUN 114* 111* 100* 81* 76*  CREATININE 11.99* 11.18* 10.12* 7.24* 6.36*  CALCIUM 9.0 8.8* 8.7* 8.1* 8.2*  MG  --   --   --   --  2.2  PHOS  --   --  5.9* 5.5* 5.6*   Liver Function Tests:  Recent Labs Lab 02/16/15 1756 02/17/15 0225 02/17/15 0645  ALBUMIN 2.7* 2.6* 2.4*   No results  for input(s): LIPASE, AMYLASE in the last 168 hours. No results for input(s): AMMONIA in the last 168 hours. CBC:  Recent Labs Lab 02/15/15 1206 02/16/15 0735 02/17/15 0225  WBC 18.4* 16.3* 9.8  NEUTROABS 15.8*  --   --   HGB 14.7 14.2 12.3*  HCT 43.7 42.7 38.0*  MCV 90.5 91.6 93.4  PLT 133* 128* 117*   Cardiac Enzymes:  Recent Labs Lab 02/15/15 1206  TROPONINI 0.12*   BNP (last 3 results)  Recent Labs  02/15/15 1206  BNP 522.9*    ProBNP (last 3 results) No results for input(s): PROBNP in the last 8760 hours.  CBG: No results for input(s): GLUCAP in the last 168 hours.  Recent Results (from the past 240 hour(s))  MRSA PCR Screening     Status: None   Collection Time: 02/15/15  7:54 PM  Result Value Ref Range Status   MRSA by PCR NEGATIVE NEGATIVE Final    Comment:        The GeneXpert MRSA Assay (FDA approved for NASAL specimens only), is one component of a comprehensive MRSA colonization surveillance program. It is not intended to diagnose MRSA infection nor to guide or monitor treatment for MRSA infections.      Studies: Ct Abdomen Pelvis Wo Contrast  02/15/2015  CLINICAL DATA:  81 year old male with abdominal pain and bloating since this morning. Recent dizziness. Acute renal failure. Current history of bladder cancer, gross hematuria. EXAM: CT ABDOMEN AND PELVIS WITHOUT CONTRAST TECHNIQUE: Multidetector CT imaging of the abdomen and pelvis was performed following the standard protocol without IV contrast. COMPARISON:  Renal ultrasound 02/12/2015. Lumbar MRI 07/29/2014. CT Abdomen and Pelvis 08/23/2011. FINDINGS: Chronic elevation of the right hemidiaphragm. Right middle lobe and lower lobe atelectasis with some air bronchograms. Superimposed bibasilar dependent atelectasis. Small volume pleural effusions in both costophrenic angles. No pericardial effusion. Chronic degenerative changes in the spine. Chronic lumbar pars defects associated with L3-L4  anterolisthesis. No acute or suspicious osseous lesion identified. Abnormal bladder wall thickening with Foley catheter in place. Prostate surgery suspected since the 2013 comparison. No pelvic free fluid. Negative rectum. Pelvic sidewall lymphadenopathy greater on the left, up to 10 mm short axis. No inguinal lymphadenopathy. Redundant sigmoid colon with mild diverticulosis. Occasional diverticula in the left colon. Mild diverticula at the hepatic flexure. Negative right colon. Appendix not identified. There is trace right lower quadrant free fluid associated with left greater than right retroperitoneal and lateral Conal fascia stranding. See renal findings below. Most of the small bowel is decompressed. There are some gas-filled nondilated loops in the ventral abdomen. The stomach is distended with oral contrast. Negative duodenum. Noncontrast gallbladder, spleen, pancreas, and adrenal glands are within normal limits. There is a hypodense posterior right lobe liver mass approximating 5.4 cm diameter (series 2, image  32) Which is subtle in the absence of IV contrast. No other liver lesion identified. This is new since 2013. There is a 5 x 10 mm distal left ureteral stone located 10-15 mm from the left ureterovesical junction. There is associated left hydroureter and left greater than right periureteral stranding with mild left hydronephrosis. Likewise there is mild right hydronephrosis and hydroureter, however, no right ureteral calculus is identified. No nephrolithiasis. Bilateral hypodense renal cysts have increased have mildly increased since 2013. No pneumoperitoneum. No upper abdominal free fluid. Aortoiliac calcified atherosclerosis noted. Ectatic infrarenal abdominal aorta measuring up to 27 mm diameter is stable since 2013. IMPRESSION: 1. 5 cm hypodense right lobe liver mass is new since 2013 and highly suspicious for metastatic disease in this setting. 2. Bladder wall thickening. Bilateral hydronephrosis  and hydroureter suggesting obstructive uropathy. On the left there is a distal ureteral calculus measuring 5 x 10 mm located about 1 cm from the left UVJ. No right side urologic calculus, consider obstruction due to bladder tumor. 3. Left greater than right pelvic sidewall lymphadenopathy, up to 10 mm short axis. 4. Trace pleural effusions. Right greater than left lung base atelectasis. Electronically Signed   By: Genevie Ann M.D.   On: 02/15/2015 16:51   Ir Fluoro Guide Cv Line Right  02/16/2015  CLINICAL DATA:  81 year old male admitted with acute renal failure. He has been referred for evaluation for hemodialysis catheter placement. Catheter placement is indicated. EXAM: IR RIGHT FLOURO GUIDE CV LINE; IR ULTRASOUND GUIDANCE VASC ACCESS RIGHT Date: 02/16/2015 ANESTHESIA/SEDATION: Moderate (conscious) sedation was administered during this procedure. A total of 0.5 mg Versed and 25 mg Fentanyl were administered intravenously. The patient's vital signs were monitored continuously by radiology nursing throughout the course of the procedure. Total sedation time: 11 minutes FLUOROSCOPY TIME:  18 seconds TECHNIQUE: The procedure, risks, benefits, and alternatives were explained to the patient. Questions regarding the procedure were encouraged and answered. The patient understands and consents to the procedure. The right neck and chest was prepped with chlorhexidine, and draped in the usual sterile fashion using maximum barrier technique (cap and mask, sterile gown, sterile gloves, large sterile sheet, hand hygiene and cutaneous antiseptic). Local anesthesia was attained by infiltration with 1% lidocaine with epinephrine. Ultrasound demonstrated patency of the right internal jugular vein, and this was documented with an image. Under real-time ultrasound guidance, this vein was accessed with a 21 gauge micropuncture needle and image documentation was performed. A small dermatotomy was made at the access site with an 11  scalpel. A 0.018" wire was advanced into the SVC and the access needle exchanged for a 73F micropuncture vascular sheath. The 0.018" wire was then removed and a 0.035" wire advanced into the IVC. Dilation of the tissue tract was performed with a 10 French tissue dilator. A 20 cm triple-lumen trialysis catheter was then placed using Seldinger technique, with the tip terminating at the superior cavoatrial junction. Wire was removed.  The catheter was sutured in position. Patient tolerated the procedure well and remained hemodynamically stable throughout. No complications were encountered and no significant blood loss was encountered. COMPLICATIONS: None IMPRESSION: Status post placement of right IJ temporary hemodialysis catheter. Catheter ready for use. Signed, Dulcy Fanny. Earleen Newport, DO [facility] Radiology Electronically Signed   By: Corrie Mckusick D.O.   On: 02/16/2015 13:16   Ir US Guide Vasc Access Right  02/16/2015  CLINICAL DATA:  81 year old male admitted with acute renal failure. He has been referred  for evaluation for hemodialysis catheter placement. Catheter placement is indicated. EXAM: IR RIGHT FLOURO GUIDE CV LINE; IR ULTRASOUND GUIDANCE VASC ACCESS RIGHT Date: 02/16/2015 ANESTHESIA/SEDATION: Moderate (conscious) sedation was administered during this procedure. A total of 0.5 mg Versed and 25 mg Fentanyl were administered intravenously. The patient's vital signs were monitored continuously by radiology nursing throughout the course of the procedure. Total sedation time: 11 minutes FLUOROSCOPY TIME:  18 seconds TECHNIQUE: The procedure, risks, benefits, and alternatives were explained to the patient. Questions regarding the procedure were encouraged and answered. The patient understands and consents to the procedure. The right neck and chest was prepped with chlorhexidine, and draped in the usual sterile fashion using maximum barrier technique (cap and mask,  sterile gown, sterile gloves, large sterile sheet, hand hygiene and cutaneous antiseptic). Local anesthesia was attained by infiltration with 1% lidocaine with epinephrine. Ultrasound demonstrated patency of the right internal jugular vein, and this was documented with an image. Under real-time ultrasound guidance, this vein was accessed with a 21 gauge micropuncture needle and image documentation was performed. A small dermatotomy was made at the access site with an 11 scalpel. A 0.018" wire was advanced into the SVC and the access needle exchanged for a 86F micropuncture vascular sheath. The 0.018" wire was then removed and a 0.035" wire advanced into the IVC. Dilation of the tissue tract was performed with a 10 French tissue dilator. A 20 cm triple-lumen trialysis catheter was then placed using Seldinger technique, with the tip terminating at the superior cavoatrial junction. Wire was removed.  The catheter was sutured in position. Patient tolerated the procedure well and remained hemodynamically stable throughout. No complications were encountered and no significant blood loss was encountered. COMPLICATIONS: None IMPRESSION: Status post placement of right IJ temporary hemodialysis catheter. Catheter ready for use. Signed, Dulcy Fanny. Earleen Newport, DO [facility] Radiology Electronically Signed   By: Corrie Mckusick D.O.   On: 02/16/2015 13:16   Ir Nephrostomy Placement Left  02/16/2015  CLINICAL DATA:  81 year old male admitted with acute renal failure. Patient never has had dialysis. He has evidence on imaging of mild pelvicaliectasis with no frank hydronephrosis. CT demonstrates a distal left ureteral stone and minimal ureteral dilation. He has been referred for evaluation for urinary diversion, as a component of acute renal failure may be related to an obstruction. Drain catheter placement is indicated. EXAM: IR NEPHROSTOMY PLACEMENT LEFT COMPARISON:  None.  ANESTHESIA/SEDATION: 2.0 IV Versed; 25 mcg IV Fentanyl. Total Moderate Sedation Time: 72 minutes. CONTRAST:  69mL OMNIPAQUE IOHEXOL 300 MG/ML  SOLN MEDICATIONS: 400 mg IV Cipro FLUOROSCOPY TIME:  12 minutes, 36 seconds TECHNIQUE: The procedure, risks, benefits, and alternatives were explained to the patient and the patient's family. Questions regarding the procedure were encouraged and answered. The patient understands and consents to the procedure. The bilateral flanks were prepped with chlorhexidine in a sterile fashion, and a sterile drape was applied covering the operative field. A sterile gown and sterile gloves were used for the procedure. Local anesthesia was provided with 1% Lidocaine. Ultrasound survey of both the left and right kidney was performed with images stored and sent to PACs. Left: Left kidney was then identified, without significant hydronephrosis. Ultrasound guidance was used to direct a 22 gauge coaxial needle into the interpolar region of the kidney. Once the tip of the needle was withdrawn into the collecting system, this access was used to opacify the renal collecting system with contrast. A double stick technique was then  used after opacifying the collecting system to select a lower pole posterior calyx for a second stick. A 22 gauge coaxial needle was then directed under fluoroscopy into this lower pole posterior calyx. A Nitrex wire was navigated through the infundibulum and collecting system into the ureter, and then using modified Seldinger technique, an Accustick system, and a 10 [place] dilator, a 10 French pigtail drainage catheter was placed into the renal pelvis. Near the conclusion of successful placement of the left-sided percutaneous nephrostomy tube, the patient was becoming restless and agitated. Further attempt at a right-sided percutaneous nephrostomy tube was deemed unsafe. Patient remained hemodynamically stable throughout. No complications encountered. COMPLICATIONS: None  FINDINGS: Ultrasound survey of the left kidney demonstrates minimal pelvicaliectasis. Ultrasound survey of the right kidney demonstrates no hydronephrosis, and no significant pelvicaliectasis. Images during the case demonstrate placement of left-sided percutaneous nephrostomy tube into a posterior inferior calyx. No significant hydronephrosis with infusion of contrast. IMPRESSION: Status post placement of left-sided percutaneous nephrostomy tube. Signed, Dulcy Fanny. Earleen Newport, DO [facility] Radiology PLAN: Given that the patient's agitation and discomfort precluded safe attempt at placement of a right-sided percutaneous nephrostomy tube, further attempts may be reconsidered after interval medical therapy. Observation of urine clearance from the left percutaneous nephrostomy tube. Electronically Signed   By: Corrie Mckusick D.O.   On: 02/16/2015 13:29   Ir Nephrostomy Placement Right  02/16/2015  CLINICAL DATA:  81 year old male admitted with acute renal failure. Patient never has had dialysis. He has evidence on imaging of mild pelvicaliectasis with no frank hydronephrosis. CT demonstrates a distal left ureteral stone and minimal ureteral dilation. He has been referred for evaluation for urinary diversion, as a component of acute renal failure may be related to an obstruction. Drain catheter placement is indicated. EXAM: IR NEPHROSTOMY PLACEMENT LEFT COMPARISON:  None. ANESTHESIA/SEDATION: 2.0 IV Versed; 25 mcg IV Fentanyl. Total Moderate Sedation Time: 72 minutes. CONTRAST:  77mL OMNIPAQUE IOHEXOL 300 MG/ML  SOLN MEDICATIONS: 400 mg IV Cipro FLUOROSCOPY TIME:  12 minutes, 36 seconds TECHNIQUE: The procedure, risks, benefits, and alternatives were explained to the patient and the patient's family. Questions regarding the procedure were encouraged and answered. The patient understands and consents to the procedure. The bilateral flanks were prepped with chlorhexidine in a  sterile fashion, and a sterile drape was applied covering the operative field. A sterile gown and sterile gloves were used for the procedure. Local anesthesia was provided with 1% Lidocaine. Ultrasound survey of both the left and right kidney was performed with images stored and sent to PACs. Left: Left kidney was then identified, without significant hydronephrosis. Ultrasound guidance was used to direct a 22 gauge coaxial needle into the interpolar region of the kidney. Once the tip of the needle was withdrawn into the collecting system, this access was used to opacify the renal collecting system with contrast. A double stick technique was then used after opacifying the collecting system to select a lower pole posterior calyx for a second stick. A 22 gauge coaxial needle was then directed under fluoroscopy into this lower pole posterior calyx. A Nitrex wire was navigated through the infundibulum and collecting system into the ureter, and then using modified Seldinger technique, an Accustick system, and a 10 [place] dilator, a 10 French pigtail drainage catheter was placed into the renal pelvis. Near the conclusion of successful placement of the left-sided percutaneous nephrostomy tube, the patient was becoming restless and agitated. Further attempt at a right-sided percutaneous nephrostomy tube was deemed unsafe.  Patient remained hemodynamically stable throughout. No complications encountered. COMPLICATIONS: None FINDINGS: Ultrasound survey of the left kidney demonstrates minimal pelvicaliectasis. Ultrasound survey of the right kidney demonstrates no hydronephrosis, and no significant pelvicaliectasis. Images during the case demonstrate placement of left-sided percutaneous nephrostomy tube into a posterior inferior calyx. No significant hydronephrosis with infusion of contrast. IMPRESSION: Status post placement of left-sided percutaneous nephrostomy tube. Signed, Dulcy Fanny. Earleen Newport, DO [facility] Radiology PLAN: Given that the patient's agitation and discomfort precluded safe attempt at placement of a right-sided percutaneous nephrostomy tube, further attempts may be reconsidered after interval medical therapy. Observation of urine clearance from the left percutaneous nephrostomy tube. Electronically Signed   By: Corrie Mckusick D.O.   On: 02/16/2015 13:29    Scheduled Meds: . antiseptic oral rinse  7 mL Mouth Rinse q12n4p  . chlorhexidine  15 mL Mouth Rinse BID  . heparin subcutaneous  5,000 Units Subcutaneous 3 times per day  . metoprolol succinate  12.5 mg Oral Daily  . predniSONE  10 mg Oral Q breakfast  . sodium chloride  3 mL Intravenous Q12H  . sodium chloride  3 mL Intravenous Q12H   Continuous Infusions: . dextrose 5 % and 0.45% NaCl 75 mL/hr at 02/17/15 1409    Principal Problem:   Acute renal failure (ARF) (HCC) Active Problems:   Encephalopathy acute   HTN (hypertension)   Urothelial carcinoma (HCC)   Rheumatoid arthritis (HCC)   Hyperkalemia   Metabolic acidosis   Obstructive uropathy   Bilateral hydronephrosis   Left nephrolithiasis    Cherrie Franca, [facility] Pager [phone]. If 7PM-7AM, please contact night-coverage at [email or web address], password [facility] 02/17/2015, 2:32 PM  LOS: 2 days

## 2015-02-17 NOTE — Progress Notes (Signed)
Received a phone call from [place] during bedside shift report that patient appeared to be in a-fib/a-flutter. HR sustaining in 110's to 120's at rest. Pt.asymptomatic. EKG done. Paged Dr.Chiu to notify. New order was written.

## 2015-02-17 NOTE — Progress Notes (Signed)
Patients HR began to sustain again in the 120's to 130's at rest. Pt.is asymptomatic. Mid-level provider on call with [facility] paged and notified. New orders written.

## 2015-02-18 DIAGNOSIS — N179 Acute kidney failure, unspecified: Principal | ICD-10-CM

## 2015-02-18 DIAGNOSIS — E872 Acidosis: Secondary | ICD-10-CM

## 2015-02-18 DIAGNOSIS — I4891 Unspecified atrial fibrillation: Secondary | ICD-10-CM

## 2015-02-18 LAB — RENAL FUNCTION PANEL
ANION GAP: 7 (ref 5–15)
Albumin: 2.2 g/dL — ABNORMAL LOW (ref 3.5–5.0)
BUN: 49 mg/dL — AB (ref 6–20)
CALCIUM: 7.5 mg/dL — AB (ref 8.9–10.3)
CO2: 31 mmol/L (ref 22–32)
CREATININE: 3.24 mg/dL — AB (ref 0.61–1.24)
Chloride: 105 mmol/L (ref 101–111)
GFR calc non Af Amer: 16 mL/min — ABNORMAL LOW (ref >60)
GFR, EST AFRICAN AMERICAN: 19 mL/min — AB (ref >60)
GLUCOSE: 169 mg/dL — AB (ref 65–99)
PHOSPHORUS: 4.7 mg/dL — AB (ref 2.5–4.6)
Potassium: 3.3 mmol/L — ABNORMAL LOW (ref 3.5–5.1)
SODIUM: 143 mmol/L (ref 135–145)

## 2015-02-18 LAB — CBC
HCT: 33.5 % — ABNORMAL LOW (ref 39.0–52.0)
HEMOGLOBIN: 10.4 g/dL — AB (ref 13.0–17.0)
MCH: 29.4 pg (ref 26.0–34.0)
MCHC: 31 g/dL (ref 30.0–36.0)
MCV: 94.6 fL (ref 78.0–100.0)
PLATELETS: 94 10*3/uL — AB (ref 150–400)
RBC: 3.54 MIL/uL — AB (ref 4.22–5.81)
RDW: 14.8 % (ref 11.5–15.5)
WBC: 9.8 10*3/uL (ref 4.0–10.5)

## 2015-02-18 MED ORDER — POLYETHYLENE GLYCOL 3350 17 G PO PACK
17.0000 g | PACK | Freq: Every day | ORAL | Status: DC | PRN
  Administered 2015-02-18 – 2015-02-19 (×2): 17 g via ORAL
  Filled 2015-02-18 (×3): qty 1

## 2015-02-18 MED ORDER — DEXTROSE 5 % IV SOLN
5.0000 mg/h | INTRAVENOUS | Status: AC
  Administered 2015-02-18: 5 mg/h via INTRAVENOUS
  Administered 2015-02-19: 15 mg/h via INTRAVENOUS
  Administered 2015-02-19: 5 mg/h via INTRAVENOUS
  Administered 2015-02-20 (×2): 15 mg/h via INTRAVENOUS
  Administered 2015-02-20: 10 mg/h via INTRAVENOUS
  Administered 2015-02-20: 15 mg/h via INTRAVENOUS
  Administered 2015-02-21: 10 mg/h via INTRAVENOUS
  Filled 2015-02-18 (×9): qty 100

## 2015-02-18 MED ORDER — POTASSIUM CHLORIDE CRYS ER 20 MEQ PO TBCR
40.0000 meq | EXTENDED_RELEASE_TABLET | Freq: Once | ORAL | Status: AC
  Administered 2015-02-18: 40 meq via ORAL
  Filled 2015-02-18: qty 2

## 2015-02-18 NOTE — Progress Notes (Signed)
Referring Physician(s): Urology  Chief Complaint: Acute renal failure Hydronephrosis with ureteral calculi on left S/p Left PCN 10/25  Subjective: Patient with complaints of worsening abdominal pelvic pain L > R, he complains of minimal tenderness at PCN site  Allergies: Other  Medications: Prior to Admission medications   Medication Sig Start Date End Date Taking? Authorizing [place] Fetherolf  acetaminophen (TYLENOL) 650 MG CR tablet Take 1,300 mg by mouth every 6 (six) hours as needed for pain.   Yes Historical Suzann Lazaro, MD  acetaminophen-codeine (TYLENOL #3) 300-30 MG tablet Take 1 tablet by mouth daily as needed for moderate pain.    Yes Historical Atreus Hasz, MD  b complex vitamins tablet Take 1 tablet by mouth daily.   Yes Historical Malik Ruffino, MD  Calcium Citrate-Vitamin D (CALCIUM + D PO) Take 1 tablet by mouth daily.   Yes Historical Duy Lemming, MD  diltiazem (TIAZAC) 180 MG 24 hr capsule Take 180 mg by mouth daily.   Yes Historical Narely Nobles, MD  hydroxychloroquine (PLAQUENIL) 200 MG tablet Take 400 mg by mouth daily.   Yes Historical Earnest Mcgillis, MD  hyoscyamine (LEVSIN SL) 0.125 MG SL tablet Take 0.125 mg by mouth every 4 (four) hours as needed (bladder spasms).  02/01/15  Yes Historical Gila Lauf, MD  Multiple Vitamin (MULTIVITAMIN WITH MINERALS) TABS tablet Take 1 tablet by mouth daily. 05/15/14  Yes Erline Hau, MD  polyethylene glycol powder (GLYCOLAX/MIRALAX) powder Take 255 g by mouth daily. Patient taking differently: Take 17 g by mouth daily.  11/27/14  Yes Willia Craze, NP  predniSONE (DELTASONE) 5 MG tablet Take 5-12.5 mg by mouth daily with breakfast. 10-18 to 10-31 12.5 mg, 11-1 to  11-14 10 mg, 11-15 to 11-29 7.5 mg, 11-30 to 12-13 5 mg then back to the MD for further directions.   Yes Historical Karanveer Ramakrishnan, MD  traMADol (ULTRAM) 50 MG tablet Take 50 mg by mouth every 6 (six) hours as needed for moderate pain.   Yes Historical French Kendra, MD   Vital Signs: BP  96/40 mmHg  Pulse 86  Temp(Src) 98.2 F (36.8 C) (Oral)  Resp 13  Ht 5\' 11"  (1.803 m)  Wt 220 lb 0.3 oz (99.8 kg)  BMI 30.70 kg/m2  SpO2 95%  Physical Exam General: A&Ox3, NAD Abd: TTP pelvic region b/l L>R Left PCN intact blood tinged urine 2,600 cc/24 hrs  Imaging: Ct Abdomen Pelvis Wo Contrast  02/15/2015  CLINICAL DATA:  81 year old male with abdominal pain and bloating since this morning. Recent dizziness. Acute renal failure. Current history of bladder cancer, gross hematuria. EXAM: CT ABDOMEN AND PELVIS WITHOUT CONTRAST TECHNIQUE: Multidetector CT imaging of the abdomen and pelvis was performed following the standard protocol without IV contrast. COMPARISON:  Renal ultrasound 02/12/2015. Lumbar MRI 07/29/2014. CT Abdomen and Pelvis 08/23/2011. FINDINGS: Chronic elevation of the right hemidiaphragm. Right middle lobe and lower lobe atelectasis with some air bronchograms. Superimposed bibasilar dependent atelectasis. Small volume pleural effusions in both costophrenic angles. No pericardial effusion. Chronic degenerative changes in the spine. Chronic lumbar pars defects associated with L3-L4 anterolisthesis. No acute or suspicious osseous lesion identified. Abnormal bladder wall thickening with Foley catheter in place. Prostate surgery suspected since the 2013 comparison. No pelvic free fluid. Negative rectum. Pelvic sidewall lymphadenopathy greater on the left, up to 10 mm short axis. No inguinal lymphadenopathy. Redundant sigmoid colon with mild diverticulosis. Occasional diverticula in the left colon. Mild diverticula at the hepatic flexure. Negative right colon. Appendix not identified. There is trace right lower quadrant  free fluid associated with left greater than right retroperitoneal and lateral Conal fascia stranding. See renal findings below. Most of the small bowel is decompressed. There are some gas-filled nondilated loops in the ventral abdomen. The stomach is distended with oral  contrast. Negative duodenum. Noncontrast gallbladder, spleen, pancreas, and adrenal glands are within normal limits. There is a hypodense posterior right lobe liver mass approximating 5.4 cm diameter (series 2, image 32) Which is subtle in the absence of IV contrast. No other liver lesion identified. This is new since 2013. There is a 5 x 10 mm distal left ureteral stone located 10-15 mm from the left ureterovesical junction. There is associated left hydroureter and left greater than right periureteral stranding with mild left hydronephrosis. Likewise there is mild right hydronephrosis and hydroureter, however, no right ureteral calculus is identified. No nephrolithiasis. Bilateral hypodense renal cysts have increased have mildly increased since 2013. No pneumoperitoneum. No upper abdominal free fluid. Aortoiliac calcified atherosclerosis noted. Ectatic infrarenal abdominal aorta measuring up to 27 mm diameter is stable since 2013. IMPRESSION: 1. 5 cm hypodense right lobe liver mass is new since 2013 and highly suspicious for metastatic disease in this setting. 2. Bladder wall thickening. Bilateral hydronephrosis and hydroureter suggesting obstructive uropathy. On the left there is a distal ureteral calculus measuring 5 x 10 mm located about 1 cm from the left UVJ. No right side urologic calculus, consider obstruction due to bladder tumor. 3. Left greater than right pelvic sidewall lymphadenopathy, up to 10 mm short axis. 4. Trace pleural effusions. Right greater than left lung base atelectasis. Electronically Signed   By: Genevie Ann M.D.   On: 02/15/2015 16:51   Dg Chest 2 View  02/15/2015  CLINICAL DATA:  Increased weakness over the past few days. Worsening cough. Chest congestion. EXAM: CHEST  2 VIEW COMPARISON:  02/12/2015. FINDINGS: Poor inspiration. Enlarged cardiac silhouette with a mild increase in size. Chronically elevated right hemidiaphragm. Clear lungs. Small bilateral pleural effusions. Upper lumbar  spine degenerative changes. IMPRESSION: 1. Small bilateral pleural effusions. 2. Mildly progressive cardiomegaly. 3. Stable chronically elevated right hemidiaphragm. Electronically Signed   By: Claudie Revering M.D.   On: 02/15/2015 10:48   Ir Fluoro Guide Cv Line Right  02/16/2015  CLINICAL DATA:  81 year old male admitted with acute renal failure. He has been referred for evaluation for hemodialysis catheter placement. Catheter placement is indicated. EXAM: IR RIGHT FLOURO GUIDE CV LINE; IR ULTRASOUND GUIDANCE VASC ACCESS RIGHT Date: 02/16/2015 ANESTHESIA/SEDATION: Moderate (conscious) sedation was administered during this procedure. A total of 0.5 mg Versed and 25 mg Fentanyl were administered intravenously. The patient's vital signs were monitored continuously by radiology nursing throughout the course of the procedure. Total sedation time: 11 minutes FLUOROSCOPY TIME:  18 seconds TECHNIQUE: The procedure, risks, benefits, and alternatives were explained to the patient. Questions regarding the procedure were encouraged and answered. The patient understands and consents to the procedure. The right neck and chest was prepped with chlorhexidine, and draped in the usual sterile fashion using maximum barrier technique (cap and mask, sterile gown, sterile gloves, large sterile sheet, hand hygiene and cutaneous antiseptic). Local anesthesia was attained by infiltration with 1% lidocaine with epinephrine. Ultrasound demonstrated patency of the right internal jugular vein, and this was documented with an image. Under real-time ultrasound guidance, this vein was accessed with a 21 gauge micropuncture needle and image documentation was performed. A small dermatotomy was made at the access site with an 11 scalpel. A 0.018" wire was advanced into  the SVC and the access needle exchanged for a 67F micropuncture vascular sheath. The 0.018" wire was then removed and a 0.035" wire advanced into the IVC. Dilation of the tissue  tract was performed with a 10 French tissue dilator. A 20 cm triple-lumen trialysis catheter was then placed using Seldinger technique, with the tip terminating at the superior cavoatrial junction. Wire was removed.  The catheter was sutured in position. Patient tolerated the procedure well and remained hemodynamically stable throughout. No complications were encountered and no significant blood loss was encountered. COMPLICATIONS: None IMPRESSION: Status post placement of right IJ temporary hemodialysis catheter. Catheter ready for use. Signed, Dulcy Fanny. Earleen Newport, DO [facility] Rad[facility] Electronically Signed   By: Corrie Mckusick D.O.   On: 02/16/2015 13:16   Ir US Guide Vasc Access Right  02/16/2015  CLINICAL DATA:  81 year old male admitted with acute renal failure. He has been referred for evaluation for hemodialysis catheter placement. Catheter placement is indicated. EXAM: IR RIGHT FLOURO GUIDE CV LINE; IR ULTRASOUND GUIDANCE VASC ACCESS RIGHT Date: 02/16/2015 ANESTHESIA/SEDATION: Moderate (conscious) sedation was administered during this procedure. A total of 0.5 mg Versed and 25 mg Fentanyl were administered intravenously. The patient's vital signs were monitored continuously by radiology nursing throughout the course of the procedure. Total sedation time: 11 minutes FLUOROSCOPY TIME:  18 seconds TECHNIQUE: The procedure, risks, benefits, and alternatives were explained to the patient. Questions regarding the procedure were encouraged and answered. The patient understands and consents to the procedure. The right neck and chest was prepped with chlorhexidine, and draped in the usual sterile fashion using maximum barrier technique (cap and mask, sterile gown, sterile gloves, large sterile sheet, hand hygiene and cutaneous antiseptic). Local anesthesia was attained by infiltration with 1% lidocaine with epinephrine. Ultrasound demonstrated patency of the right  internal jugular vein, and this was documented with an image. Under real-time ultrasound guidance, this vein was accessed with a 21 gauge micropuncture needle and image documentation was performed. A small dermatotomy was made at the access site with an 11 scalpel. A 0.018" wire was advanced into the SVC and the access needle exchanged for a 67F micropuncture vascular sheath. The 0.018" wire was then removed and a 0.035" wire advanced into the IVC. Dilation of the tissue tract was performed with a 10 French tissue dilator. A 20 cm triple-lumen trialysis catheter was then placed using Seldinger technique, with the tip terminating at the superior cavoatrial junction. Wire was removed.  The catheter was sutured in position. Patient tolerated the procedure well and remained hemodynamically stable throughout. No complications were encountered and no significant blood loss was encountered. COMPLICATIONS: None IMPRESSION: Status post placement of right IJ temporary hemodialysis catheter. Catheter ready for use. Signed, Dulcy Fanny. Earleen Newport, DO [facility] Radiology Electronically Signed   By: Corrie Mckusick D.O.   On: 02/16/2015 13:16   Ir Nephrostomy Placement Left  02/16/2015  CLINICAL DATA:  81 year old male admitted with acute renal failure. Patient never has had dialysis. He has evidence on imaging of mild pelvicaliectasis with no frank hydronephrosis. CT demonstrates a distal left ureteral stone and minimal ureteral dilation. He has been referred for evaluation for urinary diversion, as a component of acute renal failure may be related to an obstruction. Drain catheter placement is indicated. EXAM: IR NEPHROSTOMY PLACEMENT LEFT COMPARISON:  None. ANESTHESIA/SEDATION: 2.0 IV Versed; 25 mcg IV Fentanyl. Total Moderate Sedation Time: 72 minutes. CONTRAST:  64mL OMNIPAQUE IOHEXOL 300 MG/ML  SOLN MEDICATIONS: 400 mg IV Cipro FLUOROSCOPY TIME:  12 minutes, 36 seconds TECHNIQUE:  The procedure, risks, benefits, and alternatives were explained to the patient and the patient's family. Questions regarding the procedure were encouraged and answered. The patient understands and consents to the procedure. The bilateral flanks were prepped with chlorhexidine in a sterile fashion, and a sterile drape was applied covering the operative field. A sterile gown and sterile gloves were used for the procedure. Local anesthesia was provided with 1% Lidocaine. Ultrasound survey of both the left and right kidney was performed with images stored and sent to PACs. Left: Left kidney was then identified, without significant hydronephrosis. Ultrasound guidance was used to direct a 22 gauge coaxial needle into the interpolar region of the kidney. Once the tip of the needle was withdrawn into the collecting system, this access was used to opacify the renal collecting system with contrast. A double stick technique was then used after opacifying the collecting system to select a lower pole posterior calyx for a second stick. A 22 gauge coaxial needle was then directed under fluoroscopy into this lower pole posterior calyx. A Nitrex wire was navigated through the infundibulum and collecting system into the ureter, and then using modified Seldinger technique, an Accustick system, and a 10 [place] dilator, a 10 French pigtail drainage catheter was placed into the renal pelvis. Near the conclusion of successful placement of the left-sided percutaneous nephrostomy tube, the patient was becoming restless and agitated. Further attempt at a right-sided percutaneous nephrostomy tube was deemed unsafe. Patient remained hemodynamically stable throughout. No complications encountered. COMPLICATIONS: None FINDINGS: Ultrasound survey of the left kidney demonstrates minimal pelvicaliectasis. Ultrasound survey of the right kidney demonstrates no hydronephrosis, and no significant pelvicaliectasis. Images during the case demonstrate  placement of left-sided percutaneous nephrostomy tube into a posterior inferior calyx. No significant hydronephrosis with infusion of contrast. IMPRESSION: Status post placement of left-sided percutaneous nephrostomy tube. Signed, Dulcy Fanny. Earleen Newport, DO [facility] Radiology PLAN: Given that the patient's agitation and discomfort precluded safe attempt at placement of a right-sided percutaneous nephrostomy tube, further attempts may be reconsidered after interval medical therapy. Observation of urine clearance from the left percutaneous nephrostomy tube. Electronically Signed   By: Corrie Mckusick D.O.   On: 02/16/2015 13:29    Labs:  CBC:  Recent Labs  02/15/15 1206 02/16/15 0735 02/17/15 0225 02/18/15 0350  WBC 18.4* 16.3* 9.8 9.8  HGB 14.7 14.2 12.3* 10.4*  HCT 43.7 42.7 38.0* 33.5*  PLT 133* 128* 117* 94*    COAGS:  Recent Labs  02/16/15 0735  INR 1.12    BMP:  Recent Labs  02/16/15 1756 02/17/15 0225 02/17/15 0645 02/18/15 0350  NA 140 141 145 143  K 6.1* 4.5 4.2 3.3*  CL 102 104 105 105  CO2 24 28 30 31   GLUCOSE 136* 180* 185* 169*  BUN 100* 81* 76* 49*  CALCIUM 8.7* 8.1* 8.2* 7.5*  CREATININE 10.12* 7.24* 6.36* 3.24*  GFRNONAA 4* 6* 7* 16*  GFRAA 5* 7* 8* 19*    LIVER FUNCTION TESTS:  Recent Labs  05/14/14 2110 02/16/15 1756 02/17/15 0225 02/17/15 0645 02/18/15 0350  BILITOT 1.0  --   --   --   --   AST 45*  --   --   --   --   ALT 41  --   --   --   --   ALKPHOS 92  --   --   --   --  PROT 6.3  --   --   --   --   ALBUMIN 2.3* 2.7* 2.6* 2.4* 2.2*    Assessment and Plan: Acute renal failure Hydronephrosis with ureteral calculi on left S/p Left PCN 10/25, patient unable to tolerate procedure for right PCN- No hydronephrosis by [place]. If renal function plateaus, consider repeat renal US to see if any component of obstructive uropathy on right. Good output from left PCN and renal function trending down,  H/H slightly down with worsening abdominal pain- follow labs closely Continue to follow closely  Plans per Urology [facility] [place]    Signed: Hedy Jacob 02/18/2015, 11:57 AM   I spent a total of 15 Minutes at the the patient's bedside AND on the patient's hospital floor or unit, greater than 50% of which was counseling/coordinating care for renal failure secondary to obstruction.

## 2015-02-18 NOTE — Progress Notes (Signed)
[facility] PROGRESS NOTE  [name] ATF:[id number] DOB: [birth year] DOA: 02/15/2015 PCP: [name], MD  HPI/Brief narrative Craig Waters. is an 81 y.o. male with a PMH of rheumatoid arthritis on chronic prednisone and other immunosuppressive therapy, high-grade urothelial cell carcinoma, under the care of Dr. [name] at [facility], who has scheduled him for a palliative surgical procedure for 02/17/15 but who presented with generalized weakness, failure to thrive, nausea, loss of appetite, and an unsteady gait. Pt was found to have Cr of 12 and K of 7.5  Assessment/Plan: 1. AKI -suspect progressive since March'16, due to progressive obstruction and poor Po intake -Urine output initially very poor but has since picked up markedly with 3.6L out over past 24hrs -Bilateral hydronephrosis on CT -Renal consulted and pt underwent HD cath and Perc Tubes placement on 10/25 -With improving renal function and increased urine output, pt has avoided transfer to [facility] for dialysis  2. Persistent Hyperkalemia/Hypokalemia -due to #1 -no peak T waves or arrhthymias since admission -Resolved, now mildly hypokalemic - replace  3. Bladder CA -currently under the care of Dr. [name] at [facility], he was notified of admission per EDP yesterday -Urology was consulted. Pt is s/p resection 07/07/14 and had a follow-up cystoscopy 11/16/14 showing persistent disease, was scheduled for cystoscopy, TURBT/ vaporization with possible intravesical epirubicin -Pt now s/p L nephrostomy placement, R could not be placed secondary to agitation -Urology now with recs to hold off on stone extraction and to f/u at [facility] for re-resection -IR recs for re-imaging of R kidney when renal function stabilizes  4. Uremic encephalopathy -Seems improved  5. H/o RA on steroids -being tapered, currently on 10mg  daily, continue same  6. DVT proph: SCD's  7. Afib with RVR - CHADS-VASc of 3 - Given  thrombocytopenia and hematuria, will hold off on anticoagulation at this time - HR as high as the 150-160's - low dose beta blocker was initially ordered - Lytes normal. Will check 2d echo unremarkable - TSH on 05/15/14 w/in normal limits - Discussed with pharmacy. Will start pt on cardizem gtt  8. Thrombocytopenia - Heparin stopped per above - follow CBC trends  Code Status: Full Family Communication: Pt in room Disposition Plan: Pending   Consultants:  Nephrology  Urology  IR  Procedures:  HD cath placement 10/25  L Nephrostomy placement 10/25 (R side not done secondary to increased agitation)  Antibiotics: Anti-infectives    Start     Dose/Rate Route Frequency Ordered Stop   02/16/15 1030  ciprofloxacin (CIPRO) IVPB 400 mg     400 mg 200 mL/hr over 60 Minutes Intravenous  Once 02/16/15 0931 02/16/15 1052   02/16/15 0932  ciprofloxacin (CIPRO) 400 MG/200ML IVPB    Comments:  Margaretmary Dys   : cabinet override      02/16/15 0932 02/16/15 0950      HPI/Subjective: Pt denies chest pain or sob  Objective: Filed Vitals:   02/18/15 1205 02/18/15 1206 02/18/15 1303 02/18/15 1324  BP: 107/87 133/101 144/70 113/77  Pulse: 72     Temp:      TempSrc:      Resp: 16 19 17 12   Height:      Weight:      SpO2: 95% 95% 94% 97%    Intake/Output Summary (Last 24 hours) at 02/18/15 1426 Last data filed at 02/18/15 1300  Gross per 24 hour  Intake 2036.25 ml  Output   2275 ml  Net -238.75 ml   [employer]  02/15/15 2107 02/18/15 0846  Weight: 101.6 kg (223 lb 15.8 oz) 99.8 kg (220 lb 0.3 oz)    Exam:   General:  Awake, laying in bed, in nad  Cardiovascular: regular, s1, s2  Respiratory: normal resp effort, no wheezing  Abdomen: soft,nondistended, pos BS  Musculoskeletal: perfused, no clubbing   Data Reviewed: Basic Metabolic Panel:  Recent Labs Lab 02/16/15 1533 02/16/15 1756 02/17/15 0225 02/17/15 0645 02/18/15 0350  NA 140 140 141 145 143   K 5.9* 6.1* 4.5 4.2 3.3*  CL 101 102 104 105 105  CO2 26 24 28 30 31   GLUCOSE 146* 136* 180* 185* 169*  BUN 111* 100* 81* 76* 49*  CREATININE 11.18* 10.12* 7.24* 6.36* 3.24*  CALCIUM 8.8* 8.7* 8.1* 8.2* 7.5*  MG  --   --   --  2.2  --   PHOS  --  5.9* 5.5* 5.6* 4.7*   Liver Function Tests:  Recent Labs Lab 02/16/15 1756 02/17/15 0225 02/17/15 0645 02/18/15 0350  ALBUMIN 2.7* 2.6* 2.4* 2.2*   No results for input(s): LIPASE, AMYLASE in the last 168 hours. No results for input(s): AMMONIA in the last 168 hours. CBC:  Recent Labs Lab 02/15/15 1206 02/16/15 0735 02/17/15 0225 02/18/15 0350  WBC 18.4* 16.3* 9.8 9.8  NEUTROABS 15.8*  --   --   --   HGB 14.7 14.2 12.3* 10.4*  HCT 43.7 42.7 38.0* 33.5*  MCV 90.5 91.6 93.4 94.6  PLT 133* 128* 117* 94*   Cardiac Enzymes:  Recent Labs Lab 02/15/15 1206  TROPONINI 0.12*   BNP (last 3 results)  Recent Labs  02/15/15 1206  BNP 522.9*    ProBNP (last 3 results) No results for input(s): PROBNP in the last 8760 hours.  CBG: No results for input(s): GLUCAP in the last 168 hours.  Recent Results (from the past 240 hour(s))  MRSA PCR Screening     Status: None   Collection Time: 02/15/15  7:54 PM  Result Value Ref Range Status   MRSA by PCR NEGATIVE NEGATIVE Final    Comment:        The GeneXpert MRSA Assay (FDA approved for NASAL specimens only), is one component of a comprehensive MRSA colonization surveillance program. It is not intended to diagnose MRSA infection nor to guide or monitor treatment for MRSA infections.      Studies: No results found.  Scheduled Meds: . antiseptic oral rinse  7 mL Mouth Rinse q12n4p  . chlorhexidine  15 mL Mouth Rinse BID  . predniSONE  10 mg Oral Q breakfast  . sodium chloride  3 mL Intravenous Q12H  . sodium chloride  3 mL Intravenous Q12H   Continuous Infusions: . dextrose 5 % and 0.45% NaCl 75 mL/hr at 02/17/15 1800  . diltiazem (CARDIZEM) infusion 5 mg/hr  (02/18/15 1230)    Principal Problem:   Acute renal failure (ARF) (HCC) Active Problems:   Encephalopathy acute   HTN (hypertension)   Urothelial carcinoma (HCC)   Rheumatoid arthritis (HCC)   Hyperkalemia   Metabolic acidosis   Obstructive uropathy   Bilateral hydronephrosis   Left nephrolithiasis   Acute renal failure (HCC)   Atrial fibrillation, rapid ([place])    Craig Waters, [facility] Pager [phone]. If 7PM-7AM, please contact night-coverage at [email or web address], password [facility] 02/18/2015, 2:26 PM  LOS: 3 days

## 2015-02-18 NOTE — Care Management Important Message (Signed)
Important Message  Patient Details  Name: Craig Waters. MRN: [id number] Date of Birth: 1930-08-15   Medicare Important Message Given:  Yes-second notification given    Shelda Altes 02/18/2015, 3:05 [place] Message  Patient Details  Name: Craig Waters. MRN: [id number] Date of Birth: 1930-07-20   Medicare Important Message Given:  Yes-second notification given    Shelda Altes 02/18/2015, 3:04 PM

## 2015-02-19 ENCOUNTER — Inpatient Hospital Stay (HOSPITAL_COMMUNITY): Payer: Medicare Other

## 2015-02-19 LAB — BASIC METABOLIC PANEL
ANION GAP: 4 — AB (ref 5–15)
Anion gap: 6 (ref 5–15)
BUN: 29 mg/dL — AB (ref 6–20)
BUN: 24 mg/dL — ABNORMAL HIGH (ref 6–20)
CALCIUM: 7.9 mg/dL — AB (ref 8.9–10.3)
CO2: 27 mmol/L (ref 22–32)
CO2: 26 mmol/L (ref 22–32)
CREATININE: 1.85 mg/dL — AB (ref 0.61–1.24)
Calcium: 7.8 mg/dL — ABNORMAL LOW (ref 8.9–10.3)
Chloride: 111 mmol/L (ref 101–111)
Chloride: 109 mmol/L (ref 101–111)
Creatinine, Ser: 1.71 mg/dL — ABNORMAL HIGH (ref 0.61–1.24)
GFR calc Af Amer: 37 mL/min — ABNORMAL LOW (ref >60)
GFR, EST AFRICAN AMERICAN: 41 mL/min — AB (ref >60)
GFR, EST NON AFRICAN AMERICAN: 32 mL/min — AB (ref >60)
GFR, EST NON AFRICAN AMERICAN: 36 mL/min — AB (ref >60)
GLUCOSE: 169 mg/dL — AB (ref 65–99)
Glucose, Bld: 195 mg/dL — ABNORMAL HIGH (ref 65–99)
POTASSIUM: 3.5 mmol/L (ref 3.5–5.1)
POTASSIUM: 3.5 mmol/L (ref 3.5–5.1)
SODIUM: 139 mmol/L (ref 135–145)
Sodium: 144 mmol/L (ref 135–145)

## 2015-02-19 LAB — CBC
HEMATOCRIT: 33.6 % — AB (ref 39.0–52.0)
Hemoglobin: 10.6 g/dL — ABNORMAL LOW (ref 13.0–17.0)
MCH: 29.8 pg (ref 26.0–34.0)
MCHC: 31.5 g/dL (ref 30.0–36.0)
MCV: 94.4 fL (ref 78.0–100.0)
Platelets: 104 10*3/uL — ABNORMAL LOW (ref 150–400)
RBC: 3.56 MIL/uL — ABNORMAL LOW (ref 4.22–5.81)
RDW: 14.4 % (ref 11.5–15.5)
WBC: 9.8 10*3/uL (ref 4.0–10.5)

## 2015-02-19 MED ORDER — OXYCODONE-ACETAMINOPHEN 5-325 MG PO TABS
1.0000 | ORAL_TABLET | ORAL | Status: DC | PRN
  Administered 2015-02-20 – 2015-02-22 (×2): 1 via ORAL
  Filled 2015-02-19 (×2): qty 1

## 2015-02-19 MED ORDER — HYDRALAZINE HCL 20 MG/ML IJ SOLN
10.0000 mg | INTRAMUSCULAR | Status: DC | PRN
  Administered 2015-02-21: 10 mg via INTRAVENOUS
  Filled 2015-02-19: qty 1

## 2015-02-19 MED ORDER — ZOLPIDEM TARTRATE 5 MG PO TABS
5.0000 mg | ORAL_TABLET | Freq: Every evening | ORAL | Status: DC | PRN
  Administered 2015-02-19: 5 mg via ORAL
  Filled 2015-02-19: qty 1

## 2015-02-19 MED ORDER — HYDRALAZINE HCL 20 MG/ML IJ SOLN
5.0000 mg | INTRAMUSCULAR | Status: DC | PRN
  Administered 2015-02-19: 5 mg via INTRAVENOUS
  Filled 2015-02-19: qty 1

## 2015-02-19 MED ORDER — DILTIAZEM HCL ER COATED BEADS 120 MG PO CP24: 120.0000 mg | ORAL_CAPSULE | Freq: Every day | ORAL | Status: DC

## 2015-02-19 MED ORDER — MORPHINE SULFATE (PF) 2 MG/ML IV SOLN
2.0000 mg | INTRAVENOUS | Status: DC | PRN
  Administered 2015-02-20: 2 mg via INTRAVENOUS
  Filled 2015-02-19: qty 1

## 2015-02-19 MED ORDER — DILTIAZEM HCL ER COATED BEADS 180 MG PO CP24: 180.0000 mg | ORAL_CAPSULE | Freq: Every day | ORAL | Status: DC

## 2015-02-19 NOTE — Progress Notes (Signed)
Urinary catheter is producing scant amount of pink urine with a few blood clots present this afternoon.  Flushed urinary catheter with 30 ml normal saline.

## 2015-02-19 NOTE — Progress Notes (Signed)
[facility] PROGRESS NOTE  Craig Waters. PXT:[id number] DOB: 1931-01-18 DOA: 02/15/2015 PCP: [name], MD  HPI/Brief narrative Craig Waters. is an 81 y.o. male with a PMH of rheumatoid arthritis on chronic prednisone and other immunosuppressive therapy, high-grade urothelial cell carcinoma, under the care of Dr. [name] at [facility], who has scheduled him for a palliative surgical procedure for 02/17/15 but who presented with generalized weakness, failure to thrive, nausea, loss of appetite, and an unsteady gait. Pt was found to have Cr of 12 and K of 7.5  Assessment/Plan: 1. AKI -suspect progressive since March'16, due to progressive obstruction and poor Po intake -Urine output initially very poor but has since picked up markedly with 2.4L out over past 24hrs -Bilateral hydronephrosis noted on CT -Renal was consulted and pt underwent HD cath and Perc Tubes placement on 10/25 -With improving renal function and increased urine output, pt has avoided transfer to [facility] for dialysis  - Renal function continues to improve with nephrostomy tube nicely and is heading towards normal  2. Persistent Hyperkalemia/Hypokalemia -due to #1 -no peak T waves or arrhthymias since admission -Resolved, stable  3. Bladder CA -currently under the care of Dr. [name] at [facility], he was notified of admission per EDP yesterday -Urology was consulted. Pt is s/p resection 07/07/14 and had a follow-up cystoscopy 11/16/14 showing persistent disease, was scheduled for cystoscopy, TURBT/ vaporization with possible intravesical epirubicin -Pt now s/p L nephrostomy placement, R could not be placed secondary to agitation -Urology now with recs to hold off on stone extraction and to f/u at [facility] for re-resection and to leave WITH nephrostomy tube in place  4. Uremic encephalopathy -Seems resolved  5. H/o RA on steroids -being tapered, currently on 10mg  daily, continue same  6. DVT proph:  SCD's  7. Afib with RVR - CHADS-VASc of 3 - Given thrombocytopenia and hematuria, holding off on anticoagulation at this time - Lytes normal. 2d echo unremarkable - TSH on 05/15/14 w/in normal limits - Rate controlled on cardizem gtt - When able to tolerate PO, would transition back to home cardizem dose  8. Thrombocytopenia - Heparin stopped per above - follow CBC trends  9. Ileus, Bowel Obstruction - Abd more distended with abd Xray demonstrating gaseous distension suspicious for ileus vs bowel obstruction - Stool noted distally on imaging - Will give trial of soap suds enema - Have consulted General Surgery - Keep NPO for now  Code Status: Full Family Communication: Pt in room Disposition Plan: Pending  Consultants:  Nephrology  Urology  IR  General Sur  Procedures:  HD cath placement 10/25  L Nephrostomy placement 10/25 (R side not done secondary to increased agitation)  Antibiotics: Anti-infectives    Start     Dose/Rate Route Frequency Ordered Stop   02/16/15 1030  ciprofloxacin (CIPRO) IVPB 400 mg     400 mg 200 mL/hr over 60 Minutes Intravenous  Once 02/16/15 0931 02/16/15 1052   02/16/15 0932  ciprofloxacin (CIPRO) 400 MG/200ML IVPB    Comments:  Margaretmary Dys   : cabinet override      02/16/15 0932 02/16/15 0950      HPI/Subjective: Complains of increasing abd distension. Reports some flatus today. Claims to have BM yesterday  Objective: Filed Vitals:   02/19/15 0800 02/19/15 1000 02/19/15 1200 02/19/15 1400  BP: 169/73  158/70 185/84  Pulse:      Temp: 98.7 F (37.1 C)     TempSrc: Oral     Resp:  16 19  Height:      Weight:      SpO2:  88% 95% 98%    Intake/Output Summary (Last 24 hours) at 02/19/15 1500 Last data filed at 02/19/15 1200  Gross per 24 hour  Intake 2499.08 ml  Output   2635 ml  Net -135.92 ml   Filed Weights   02/15/15 2107 02/18/15 0846  Weight: 101.6 kg (223 lb 15.8 oz) 99.8 kg (220 lb 0.3 oz)     Exam:   General:  Awake, laying in bed, in nad  Cardiovascular: regular, s1, s2  Respiratory: normal resp effort, no wheezing  Abdomen: soft,distended with decreased BS, tympanic  Musculoskeletal: perfused, no clubbing   Data Reviewed: Basic Metabolic Panel:  Recent Labs Lab 02/16/15 1756 02/17/15 0225 02/17/15 0645 02/18/15 0350 02/19/15 0408  NA 140 141 145 143 144  K 6.1* 4.5 4.2 3.3* 3.5  CL 102 104 105 105 111  CO2 24 28 30 31 27   GLUCOSE 136* 180* 185* 169* 169*  BUN 100* 81* 76* 49* 29*  CREATININE 10.12* 7.24* 6.36* 3.24* 1.85*  CALCIUM 8.7* 8.1* 8.2* 7.5* 7.8*  MG  --   --  2.2  --   --   PHOS 5.9* 5.5* 5.6* 4.7*  --    Liver Function Tests:  Recent Labs Lab 02/16/15 1756 02/17/15 0225 02/17/15 0645 02/18/15 0350  ALBUMIN 2.7* 2.6* 2.4* 2.2*   No results for input(s): LIPASE, AMYLASE in the last 168 hours. No results for input(s): AMMONIA in the last 168 hours. CBC:  Recent Labs Lab 02/15/15 1206 02/16/15 0735 02/17/15 0225 02/18/15 0350 02/19/15 0408  WBC 18.4* 16.3* 9.8 9.8 9.8  NEUTROABS 15.8*  --   --   --   --   HGB 14.7 14.2 12.3* 10.4* 10.6*  HCT 43.7 42.7 38.0* 33.5* 33.6*  MCV 90.5 91.6 93.4 94.6 94.4  PLT 133* 128* 117* 94* 104*   Cardiac Enzymes:  Recent Labs Lab 02/15/15 1206  TROPONINI 0.12*   BNP (last 3 results)  Recent Labs  02/15/15 1206  BNP 522.9*    ProBNP (last 3 results) No results for input(s): PROBNP in the last 8760 hours.  CBG: No results for input(s): GLUCAP in the last 168 hours.  Recent Results (from the past 240 hour(s))  MRSA PCR Screening     Status: None   Collection Time: 02/15/15  7:54 PM  Result Value Ref Range Status   MRSA by PCR NEGATIVE NEGATIVE Final    Comment:        The GeneXpert MRSA Assay (FDA approved for NASAL specimens only), is one component of a comprehensive MRSA colonization surveillance program. It is not intended to diagnose MRSA infection nor to guide  or monitor treatment for MRSA infections.      Studies: Dg Abd Portable 1v  02/19/2015  CLINICAL DATA:  Abdominal pain, nausea, constipation EXAM: PORTABLE ABDOMEN - 1 VIEW COMPARISON:  None. FINDINGS: Left nephrostomy catheter is noted. Gaseous distended small bowel loops on mid abdomen highly suspicious for significant ileus or partial small bowel obstruction. Stool and gas noted in distal colon. IMPRESSION: Gaseous distended small bowel loops mid abdomen highly suspicious for significant ileus or bowel obstruction. Electronically Signed   By: Lahoma Crocker M.D.   On: 02/19/2015 14:10    Scheduled Meds: . antiseptic oral rinse  7 mL Mouth Rinse q12n4p  . chlorhexidine  15 mL Mouth Rinse BID  . predniSONE  10 mg Oral Q breakfast  .  sodium chloride  3 mL Intravenous Q12H  . sodium chloride  3 mL Intravenous Q12H   Continuous Infusions: . dextrose 5 % and 0.45% NaCl 1,000 mL (02/18/15 1748)  . diltiazem (CARDIZEM) infusion 10 mg/hr (02/19/15 1200)    Principal Problem:   Acute renal failure (ARF) (HCC) Active Problems:   Encephalopathy acute   HTN (hypertension)   Urothelial carcinoma (HCC)   Rheumatoid arthritis (HCC)   Hyperkalemia   Metabolic acidosis   Obstructive uropathy   Bilateral hydronephrosis   Left nephrolithiasis   Acute renal failure (HCC)   Atrial fibrillation, rapid ([place])   Hydronephrosis, right    [facility], [place] [facility] Pager [phone]. If 7PM-7AM, please contact night-coverage at [email or web address], password [facility] 02/19/2015, 3:00 PM  LOS: 4 days

## 2015-02-19 NOTE — Progress Notes (Signed)
Patient ID: Craig Waters., male   DOB: 13-Jan-1931, 81 y.o.   MRN: [id number]        Subjective: Patient without new complaints; denies worsening flank pain, nausea or vomiting.   Allergies: Other  Medications: Prior to Admission medications   Medication Sig Start Date End Date Taking? Authorizing Provider  acetaminophen (TYLENOL) 650 MG CR tablet Take 1,300 mg by mouth every 6 (six) hours as needed for pain.   Yes Historical Provider, MD  acetaminophen-codeine (TYLENOL #3) 300-30 MG tablet Take 1 tablet by mouth daily as needed for moderate pain.    Yes Historical Provider, MD  b complex vitamins tablet Take 1 tablet by mouth daily.   Yes Historical Provider, MD  Calcium Citrate-Vitamin D (CALCIUM + D PO) Take 1 tablet by mouth daily.   Yes Historical Provider, MD  diltiazem (TIAZAC) 180 MG 24 hr capsule Take 180 mg by mouth daily.   Yes Historical Provider, MD  hydroxychloroquine (PLAQUENIL) 200 MG tablet Take 400 mg by mouth daily.   Yes Historical Provider, MD  hyoscyamine (LEVSIN SL) 0.125 MG SL tablet Take 0.125 mg by mouth every 4 (four) hours as needed (bladder spasms).  02/01/15  Yes Historical Provider, MD  Multiple Vitamin (MULTIVITAMIN WITH MINERALS) TABS tablet Take 1 tablet by mouth daily. 05/15/14  Yes Erline Hau, MD  polyethylene glycol powder (GLYCOLAX/MIRALAX) powder Take 255 g by mouth daily. Patient taking differently: Take 17 g by mouth daily.  11/27/14  Yes Willia Craze, NP  predniSONE (DELTASONE) 5 MG tablet Take 5-12.5 mg by mouth daily with breakfast. 10-18 to 10-31 12.5 mg, 11-1 to  11-14 10 mg, 11-15 to 11-29 7.5 mg, 11-30 to 12-13 5 mg then back to the MD for further directions.   Yes Historical Provider, MD  traMADol (ULTRAM) 50 MG tablet Take 50 mg by mouth every 6 (six) hours as needed for moderate pain.   Yes Historical Provider, MD     Vital Signs: BP 158/70 mmHg  Pulse 72  Temp(Src) 98.7 F (37.1 C) (Oral)  Resp 16  Ht 5\' 11"   (1.803 m)  Wt 220 lb 0.3 oz (99.8 kg)  BMI 30.70 kg/m2  SpO2 95%  Physical Exam left PCN intact, insertion site okay, mildly tender to palpation, output 725 mL tea-colored urine  Imaging: Ct Abdomen Pelvis Wo Contrast  02/15/2015  CLINICAL DATA:  81 year old male with abdominal pain and bloating since this morning. Recent dizziness. Acute renal failure. Current history of bladder cancer, gross hematuria. EXAM: CT ABDOMEN AND PELVIS WITHOUT CONTRAST TECHNIQUE: Multidetector CT imaging of the abdomen and pelvis was performed following the standard protocol without IV contrast. COMPARISON:  Renal ultrasound 02/12/2015. Lumbar MRI 07/29/2014. CT Abdomen and Pelvis 08/23/2011. FINDINGS: Chronic elevation of the right hemidiaphragm. Right middle lobe and lower lobe atelectasis with some air bronchograms. Superimposed bibasilar dependent atelectasis. Small volume pleural effusions in both costophrenic angles. No pericardial effusion. Chronic degenerative changes in the spine. Chronic lumbar pars defects associated with L3-L4 anterolisthesis. No acute or suspicious osseous lesion identified. Abnormal bladder wall thickening with Foley catheter in place. Prostate surgery suspected since the 2013 comparison. No pelvic free fluid. Negative rectum. Pelvic sidewall lymphadenopathy greater on the left, up to 10 mm short axis. No inguinal lymphadenopathy. Redundant sigmoid colon with mild diverticulosis. Occasional diverticula in the left colon. Mild diverticula at the hepatic flexure. Negative right colon. Appendix not identified. There is trace right lower quadrant free fluid associated with left greater than  right retroperitoneal and lateral Conal fascia stranding. See renal findings below. Most of the small bowel is decompressed. There are some gas-filled nondilated loops in the ventral abdomen. The stomach is distended with oral contrast. Negative duodenum. Noncontrast gallbladder, spleen, pancreas, and adrenal  glands are within normal limits. There is a hypodense posterior right lobe liver mass approximating 5.4 cm diameter (series 2, image 32) Which is subtle in the absence of IV contrast. No other liver lesion identified. This is new since 2013. There is a 5 x 10 mm distal left ureteral stone located 10-15 mm from the left ureterovesical junction. There is associated left hydroureter and left greater than right periureteral stranding with mild left hydronephrosis. Likewise there is mild right hydronephrosis and hydroureter, however, no right ureteral calculus is identified. No nephrolithiasis. Bilateral hypodense renal cysts have increased have mildly increased since 2013. No pneumoperitoneum. No upper abdominal free fluid. Aortoiliac calcified atherosclerosis noted. Ectatic infrarenal abdominal aorta measuring up to 27 mm diameter is stable since 2013. IMPRESSION: 1. 5 cm hypodense right lobe liver mass is new since 2013 and highly suspicious for metastatic disease in this setting. 2. Bladder wall thickening. Bilateral hydronephrosis and hydroureter suggesting obstructive uropathy. On the left there is a distal ureteral calculus measuring 5 x 10 mm located about 1 cm from the left UVJ. No right side urologic calculus, consider obstruction due to bladder tumor. 3. Left greater than right pelvic sidewall lymphadenopathy, up to 10 mm short axis. 4. Trace pleural effusions. Right greater than left lung base atelectasis. Electronically Signed   By: Genevie Ann M.D.   On: 02/15/2015 16:51   Ir Fluoro Guide Cv Line Right  02/16/2015  CLINICAL DATA:  81 year old male admitted with acute renal failure. He has been referred for evaluation for hemodialysis catheter placement. Catheter placement is indicated. EXAM: IR RIGHT FLOURO GUIDE CV LINE; IR ULTRASOUND GUIDANCE VASC ACCESS RIGHT Date: 02/16/2015 ANESTHESIA/SEDATION: Moderate (conscious) sedation was administered during this procedure. A total of 0.5 mg Versed and 25 mg  Fentanyl were administered intravenously. The patient's vital signs were monitored continuously by radiology nursing throughout the course of the procedure. Total sedation time: 11 minutes FLUOROSCOPY TIME:  18 seconds TECHNIQUE: The procedure, risks, benefits, and alternatives were explained to the patient. Questions regarding the procedure were encouraged and answered. The patient understands and consents to the procedure. The right neck and chest was prepped with chlorhexidine, and draped in the usual sterile fashion using maximum barrier technique (cap and mask, sterile gown, sterile gloves, large sterile sheet, hand hygiene and cutaneous antiseptic). Local anesthesia was attained by infiltration with 1% lidocaine with epinephrine. Ultrasound demonstrated patency of the right internal jugular vein, and this was documented with an image. Under real-time ultrasound guidance, this vein was accessed with a 21 gauge micropuncture needle and image documentation was performed. A small dermatotomy was made at the access site with an 11 scalpel. A 0.018" wire was advanced into the SVC and the access needle exchanged for a 29F micropuncture vascular sheath. The 0.018" wire was then removed and a 0.035" wire advanced into the IVC. Dilation of the tissue tract was performed with a 10 French tissue dilator. A 20 cm triple-lumen trialysis catheter was then placed using Seldinger technique, with the tip terminating at the superior cavoatrial junction. Wire was removed.  The catheter was sutured in position. Patient tolerated the procedure well and remained hemodynamically stable throughout. No complications were encountered and no significant blood loss was encountered. COMPLICATIONS: None IMPRESSION:  Status post placement of right IJ temporary hemodialysis catheter. Catheter ready for use. Signed, Dulcy Fanny. Earleen Newport, DO [facility] Electronically Signed   By: Corrie Mckusick D.O.   On: 02/16/2015 13:16   Ir US Guide Vasc Access Right  02/16/2015  CLINICAL DATA:  81 year old male admitted with acute renal failure. He has been referred for evaluation for hemodialysis catheter placement. Catheter placement is indicated. EXAM: IR RIGHT FLOURO GUIDE CV LINE; IR ULTRASOUND GUIDANCE VASC ACCESS RIGHT Date: 02/16/2015 ANESTHESIA/SEDATION: Moderate (conscious) sedation was administered during this procedure. A total of 0.5 mg Versed and 25 mg Fentanyl were administered intravenously. The patient's vital signs were monitored continuously by radiology nursing throughout the course of the procedure. Total sedation time: 11 minutes FLUOROSCOPY TIME:  18 seconds TECHNIQUE: The procedure, risks, benefits, and alternatives were explained to the patient. Questions regarding the procedure were encouraged and answered. The patient understands and consents to the procedure. The right neck and chest was prepped with chlorhexidine, and draped in the usual sterile fashion using maximum barrier technique (cap and mask, sterile gown, sterile gloves, large sterile sheet, hand hygiene and cutaneous antiseptic). Local anesthesia was attained by infiltration with 1% lidocaine with epinephrine. Ultrasound demonstrated patency of the right internal jugular vein, and this was documented with an image. Under real-time ultrasound guidance, this vein was accessed with a 21 gauge micropuncture needle and image documentation was performed. A small dermatotomy was made at the access site with an 11 scalpel. A 0.018" wire was advanced into the SVC and the access needle exchanged for a 21F micropuncture vascular sheath. The 0.018" wire was then removed and a 0.035" wire advanced into the IVC. Dilation of the tissue tract was performed with a 10 French tissue dilator. A 20 cm triple-lumen trialysis catheter was then placed using Seldinger technique, with the tip terminating at the superior cavoatrial junction. Wire  was removed.  The catheter was sutured in position. Patient tolerated the procedure well and remained hemodynamically stable throughout. No complications were encountered and no significant blood loss was encountered. COMPLICATIONS: None IMPRESSION: Status post placement of right IJ temporary hemodialysis catheter. Catheter ready for use. Signed, Dulcy Fanny. Earleen Newport, DO [facility] Radiology Electronically Signed   By: Corrie Mckusick D.O.   On: 02/16/2015 13:16   Ir Nephrostomy Placement Left  02/16/2015  CLINICAL DATA:  82 year old male admitted with acute renal failure. Patient never has had dialysis. He has evidence on imaging of mild pelvicaliectasis with no frank hydronephrosis. CT demonstrates a distal left ureteral stone and minimal ureteral dilation. He has been referred for evaluation for urinary diversion, as a component of acute renal failure may be related to an obstruction. Drain catheter placement is indicated. EXAM: IR NEPHROSTOMY PLACEMENT LEFT COMPARISON:  None. ANESTHESIA/SEDATION: 2.0 IV Versed; 25 mcg IV Fentanyl. Total Moderate Sedation Time: 72 minutes. CONTRAST:  110mL OMNIPAQUE IOHEXOL 300 MG/ML  SOLN MEDICATIONS: 400 mg IV Cipro FLUOROSCOPY TIME:  12 minutes, 36 seconds TECHNIQUE: The procedure, risks, benefits, and alternatives were explained to the patient and the patient's family. Questions regarding the procedure were encouraged and answered. The patient understands and consents to the procedure. The bilateral flanks were prepped with chlorhexidine in a sterile fashion, and a sterile drape was applied covering the operative field. A sterile gown and sterile gloves were used for the procedure. Local anesthesia was provided with 1% Lidocaine. Ultrasound survey of both the left and right kidney was performed  with images stored and sent to PACs. Left: Left kidney was then identified, without significant hydronephrosis. Ultrasound guidance was  used to direct a 22 gauge coaxial needle into the interpolar region of the kidney. Once the tip of the needle was withdrawn into the collecting system, this access was used to opacify the renal collecting system with contrast. A double stick technique was then used after opacifying the collecting system to select a lower pole posterior calyx for a second stick. A 22 gauge coaxial needle was then directed under fluoroscopy into this lower pole posterior calyx. A Nitrex wire was navigated through the infundibulum and collecting system into the ureter, and then using modified Seldinger technique, an Accustick system, and a 10 [place] dilator, a 10 French pigtail drainage catheter was placed into the renal pelvis. Near the conclusion of successful placement of the left-sided percutaneous nephrostomy tube, the patient was becoming restless and agitated. Further attempt at a right-sided percutaneous nephrostomy tube was deemed unsafe. Patient remained hemodynamically stable throughout. No complications encountered. COMPLICATIONS: None FINDINGS: Ultrasound survey of the left kidney demonstrates minimal pelvicaliectasis. Ultrasound survey of the right kidney demonstrates no hydronephrosis, and no significant pelvicaliectasis. Images during the case demonstrate placement of left-sided percutaneous nephrostomy tube into a posterior inferior calyx. No significant hydronephrosis with infusion of contrast. IMPRESSION: Status post placement of left-sided percutaneous nephrostomy tube. Signed, Dulcy Fanny. Earleen Newport, DO [facility] Radiology PLAN: Given that the patient's agitation and discomfort precluded safe attempt at placement of a right-sided percutaneous nephrostomy tube, further attempts may be reconsidered after interval medical therapy. Observation of urine clearance from the left percutaneous nephrostomy tube. Electronically Signed   By: Corrie Mckusick D.O.   On: 02/16/2015 13:29      Labs:  CBC:  Recent Labs  02/16/15 0735 02/17/15 0225 02/18/15 0350 02/19/15 0408  WBC 16.3* 9.8 9.8 9.8  HGB 14.2 12.3* 10.4* 10.6*  HCT 42.7 38.0* 33.5* 33.6*  PLT 128* 117* 94* 104*    COAGS:  Recent Labs  02/16/15 0735  INR 1.12    BMP:  Recent Labs  02/17/15 0225 02/17/15 0645 02/18/15 0350 02/19/15 0408  NA 141 145 143 144  K 4.5 4.2 3.3* 3.5  CL 104 105 105 111  CO2 28 30 31 27   GLUCOSE 180* 185* 169* 169*  BUN 81* 76* 49* 29*  CALCIUM 8.1* 8.2* 7.5* 7.8*  CREATININE 7.24* 6.36* 3.24* 1.85*  GFRNONAA 6* 7* 16* 32*  GFRAA 7* 8* 19* 37*    LIVER FUNCTION TESTS:  Recent Labs  05/14/14 2110 02/16/15 1756 02/17/15 0225 02/17/15 0645 02/18/15 0350  BILITOT 1.0  --   --   --   --   AST 45*  --   --   --   --   ALT 41  --   --   --   --   ALKPHOS 92  --   --   --   --   PROT 6.3  --   --   --   --   ALBUMIN 2.3* 2.7* 2.6* 2.4* 2.2*    Assessment and Plan: S/p left PCN/rt IJ HD cath10/25 secondary to acute renal failure, distal left ureteral stone , mild pelvicaliectasis; afebrile; WBC normal, creatinine down to 1.85 (3.24); continue PCN for now as plans are for patient to go back to [facility] for continued urologic treatment/lasix renal scan to eval rt kidney fxn   Signed: D. Rowe Robert 02/19/2015,  1:44 PM   I spent a total of 15 minutes at the the patient's bedside AND on the patient's hospital floor or unit, greater than 50% of which was counseling/coordinating care for left percutaneous nephrostomy

## 2015-02-19 NOTE — Progress Notes (Signed)
Utilization review completed.  

## 2015-02-19 NOTE — Progress Notes (Signed)
Subjective: The patient reports  Feeling better each day.    Craig have discussed his bladder cancer with him: He was told by his [facility] Urologist that he needed to return for 2nd resection, and that he had metastatic disease , and that he would need chemotherapy ( 5 treatments)..   Craig have explained to Craig Waters that he could be treated in [place], or return to [facility], and he would like to return to [facility] for his Urologic and Oncologic care. He understands that he has a stone obstructing his Left  ureter, and that he has a "tube" in his Left kidney which is draining his urine from that kidney. His Cr is currently 1.85.   Objective: Vital signs in last 24 hours: Temp:  [98.3 F (36.8 C)-99 F (37.2 C)] 98.3 F (36.8 C) (10/28 0500) Pulse Rate:  [72] 72 (10/27 1205) Resp:  [12-19] 16 (10/28 0754) BP: (107-169)/(41-104) 169/73 mmHg (10/28 0800) SpO2:  [93 %-99 %] 95 % (10/28 0754) Weight:  [99.8 kg (220 lb 0.3 oz)] 99.8 kg (220 lb 0.3 oz) (10/27 0846)A  Intake/Output from previous day: 10/27 0701 - 10/28 0700 In: 2607.6 [P.O.:560; Craig.V.:2047.6] Out: 2460 [Urine:2460] Intake/Output this shift: Total Craig/O In: -  Out: 250 [Urine:250]  Past Medical History  Diagnosis Date  . Blood in stool   . Diverticulitis   . GERD (gastroesophageal reflux disease)   . Glaucoma   . Ulcer   . History of transfusion of whole blood   . Prostate disorder   . Shortness of breath 10-18-11    with exertion, cardiac cath done 7-8 yrs ago negative.  . Vertigo 10-18-11    inner ear issues occ. -tx. Bonine as needed  . Arthritis 10-18-11    back, fingers  . H/O hiatal hernia 10-18-11    noted on recent CXR  . Adenomatous colon polyp   . Urothelial carcinoma ([place]) 05/2014  . PAF (paroxysmal atrial fibrillation) ([place])   . Rheumatoid arthritis ([place])   . Discitis of thoracic region 07/04/2012    Physical Exam:  Lungs - Normal respiratory effort, chest expands symmetrically.  Abdomen - Soft, non-tender &  non-distended. Left nephrostomy tube draining well Lab Results:  Recent Labs  02/17/15 0225 02/18/15 0350 02/19/15 0408  WBC 9.8 9.8 9.8  HGB 12.3* 10.4* 10.6*  HCT 38.0* 33.5* 33.6*   BMET  Recent Labs  02/18/15 0350 02/19/15 0408  NA 143 144  K 3.3* 3.5  CL 105 111  CO2 31 27  GLUCOSE 169* 169*  BUN 49* 29*  CREATININE 3.24* 1.85*  CALCIUM 7.5* 7.8*   No results for input(s): LABURIN in the last 72 hours. Results for orders placed or performed during the hospital encounter of 02/15/15  MRSA PCR Screening     Status: None   Collection Time: 02/15/15  7:54 PM  Result Value Ref Range Status   MRSA by PCR NEGATIVE NEGATIVE Final    Comment:        The GeneXpert MRSA Assay (FDA approved for NASAL specimens only), is one component of a comprehensive MRSA colonization surveillance program. It is not intended to diagnose MRSA infection nor to guide or monitor treatment for MRSA infections.     Studies/Results: CLINICAL DATA: 81 year old male with abdominal pain and bloating since this morning. Recent dizziness. Acute renal failure. Current history of bladder cancer, gross hematuria.  EXAM: CT ABDOMEN AND PELVIS WITHOUT CONTRAST  TECHNIQUE: Multidetector CT imaging of the abdomen and pelvis was performed following  the standard protocol without IV contrast.  COMPARISON: Renal ultrasound 02/12/2015. Lumbar MRI 07/29/2014. CT Abdomen and Pelvis 08/23/2011.  FINDINGS: Chronic elevation of the right hemidiaphragm. Right middle lobe and lower lobe atelectasis with some air bronchograms. Superimposed bibasilar dependent atelectasis. Small volume pleural effusions in both costophrenic angles. No pericardial effusion.  Chronic degenerative changes in the spine. Chronic lumbar pars defects associated with L3-L4 anterolisthesis. No acute or suspicious osseous lesion identified.  Abnormal bladder wall thickening with Foley catheter in place. Prostate  surgery suspected since the 2013 comparison. No pelvic free fluid. Negative rectum. Pelvic sidewall lymphadenopathy greater on the left, up to 10 mm short axis. No inguinal lymphadenopathy.  Redundant sigmoid colon with mild diverticulosis. Occasional diverticula in the left colon. Mild diverticula at the hepatic flexure. Negative right colon. Appendix not identified. There is trace right lower quadrant free fluid associated with left greater than right retroperitoneal and lateral Conal fascia stranding. See renal findings below. Most of the small bowel is decompressed. There are some gas-filled nondilated loops in the ventral abdomen. The stomach is distended with oral contrast. Negative duodenum.  Noncontrast gallbladder, spleen, pancreas, and adrenal glands are within normal limits.  There is a hypodense posterior right lobe liver mass approximating 5.4 cm diameter (series 2, image 32) Which is subtle in the absence of IV contrast. No other liver lesion identified. This is new since 2013.  There is a 5 x 10 mm distal left ureteral stone located 10-15 mm from the left ureterovesical junction. There is associated left hydroureter and left greater than right periureteral stranding with mild left hydronephrosis. Likewise there is mild right hydronephrosis and hydroureter, however, no right ureteral calculus is identified. No nephrolithiasis.  Bilateral hypodense renal cysts have increased have mildly increased since 2013.  No pneumoperitoneum. No upper abdominal free fluid. Aortoiliac calcified atherosclerosis noted. Ectatic infrarenal abdominal aorta measuring up to 27 mm diameter is stable since 2013.  IMPRESSION: 1. 5 cm hypodense right lobe liver mass is new since 2013 and highly suspicious for metastatic disease in this setting. 2. Bladder wall thickening. Bilateral hydronephrosis and hydroureter suggesting obstructive uropathy. On the left there is a  distal ureteral calculus measuring 5 x 10 mm located about 1 cm from the left UVJ. No right side urologic calculus, consider obstruction due to bladder tumor. 3. Left greater than right pelvic sidewall lymphadenopathy, up to 10 mm short axis. 4. Trace pleural effusions. Right greater than left lung base atelectasis.   Electronically Signed  By: Genevie Ann M.D.  On: 02/15/2015 16:51  Patient never has had dialysis. He has evidence on imaging of mild pelvicaliectasis with no frank hydronephrosis. CT demonstrates a distal left ureteral stone and minimal ureteral dilation.  He has been referred for evaluation for urinary diversion, as a component of acute renal failure may be related to an obstruction.  Drain catheter placement is indicated.  EXAM: IR NEPHROSTOMY PLACEMENT LEFT  COMPARISON: None.  ANESTHESIA/SEDATION: 2.0 IV Versed; 25 mcg IV Fentanyl.  Total Moderate Sedation Time: 72 minutes.  CONTRAST: 82mL OMNIPAQUE IOHEXOL 300 MG/ML SOLN  MEDICATIONS: 400 mg IV Cipro  FLUOROSCOPY TIME: 12 minutes, 36 seconds  TECHNIQUE: The procedure, risks, benefits, and alternatives were explained to the patient and the patient's family. Questions regarding the procedure were encouraged and answered. The patient understands and consents to the procedure.  The bilateral flanks were prepped with chlorhexidine in a sterile fashion, and a sterile drape was applied covering the operative field. A sterile gown and sterile  gloves were used for the procedure. Local anesthesia was provided with 1% Lidocaine.  Ultrasound survey of both the left and right kidney was performed with images stored and sent to PACs.  Left:  Left kidney was then identified, without significant hydronephrosis.  Ultrasound guidance was used to direct a 22 gauge coaxial needle into the interpolar region of the kidney.  Once the tip of the needle was withdrawn into the collecting  system, this access was used to opacify the renal collecting system with contrast.  A double stick technique was then used after opacifying the collecting system to select a lower pole posterior calyx for a second stick. A 22 gauge coaxial needle was then directed under fluoroscopy into this lower pole posterior calyx.  A Nitrex wire was navigated through the infundibulum and collecting system into the ureter, and then using modified Seldinger technique, an Accustick system, and a 10 [place] dilator, a 10 French pigtail drainage catheter was placed into the renal pelvis.  Near the conclusion of successful placement of the left-sided percutaneous nephrostomy tube, the patient was becoming restless and agitated. Further attempt at a right-sided percutaneous nephrostomy tube was deemed unsafe.  Patient remained hemodynamically stable throughout.  No complications encountered.  COMPLICATIONS: None  FINDINGS: Ultrasound survey of the left kidney demonstrates minimal pelvicaliectasis.  Ultrasound survey of the right kidney demonstrates no hydronephrosis, and no significant pelvicaliectasis.  Images during the case demonstrate placement of left-sided percutaneous nephrostomy tube into a posterior inferior calyx. No significant hydronephrosis with infusion of contrast.  IMPRESSION: Status post placement of left-sided percutaneous nephrostomy tube.  Signed,  Dulcy Fanny. Earleen Newport DO  [facility]  [facility] Radiology Assessment: Pt wants to go back to [facility] for his continued Urologic Rx. Would advise either transfer with discharge summary in hand; or  Earliest appointment ASAP, with nephrostomy to leg bag, and Lasix reenal scan to evaluate the function of the Right kidney with respect to the Left,.  Plan Please re-consult as needed:  Craig Waters Craig Waters 02/19/2015, 8:35 AM

## 2015-02-19 NOTE — Consult Note (Signed)
Reason for Consult:  Possible SBO vs ileus Referring Physician:  Masiyah Engen. is an 81 y.o. male.  HPI: *Pt brought to the Ed by family with weakness, nausea, failure to thrive.  He has know urothelial cell bladder cancer, followed by [facility], and scheduled for palliative procedure there 02/17/15. Work up here shows Acute renal failure with bilateral hydronephrosis, metabolic acidosis, and hyperkalemia.  Encephalopathy from his uremia.  He has been seen by Urology and dr. Jeffie Pollock who notes extensive bladder wall tumor involvement, pelvic adenopathy, possible liver involvement.  He was also seen by Renal, Dr.Dunham, who also agreed that nephrostomy tubes were the best course of action.  On 02/16/15 IR placed left  nephrostomy tube, and central CV line for access .  It was also Dr. Sanda Klein opinion he would need dialysis, but with increased urine output his creatinine has improved.  Abdominal distension has been an issue and still persist. It has become very apparent today since current nursing staff came on this AM.   He has not had any nausea or vomiting but has stopped eating for now.  Film obtained this afternoon shows left nephrostomy, SB distension, and it looks more like an ileus than SBO to me.  We are ask to see and follow.   Creatinine 13.22 on admit down to 1.85 yesterday. WBC 18.4 on admit down to 9.8 yesterday.  No labs today.   Past Medical History  Diagnosis Date  Urothelial cell, bladder carcinoma (high grade T1) with partial resection 06/2014; known metastasis.     Rheumatoid arthritis on immunosuppressive therapy/steroids   PAF (paroxysmal atrial fibrillation)   Hypertension AODM   Hx of Diverticulitis   GERD (gastroesophageal reflux disease)   Glaucoma   Ulcer   History of transfusion of whole blood   Prostate disorder   Shortness of breath 10-18-11   with exertion, cardiac cath done 7-8 yrs ago negative.  Vertigo 10-18-11   inner ear issues  occ. -tx. Bonine as needed  Arthritis 10-18-11   back, fingers  H/O hiatal hernia 10-18-11   noted on recent CXR  Adenomatous colon polyp   Discitis of thoracic region 07/04/2012    Past Surgical History  Procedure Laterality Date  . Gastric ulcer  10-18-11    '91-blleding ulcer with blood transfusions after  . Appendectomy  10-18-11    age 63  . Cataract extraction, bilateral  10-18-11    bilateral   . Eye surgery  10-18-11    laser eye surgery s/p cataract bilaterally  . Cardiac catheterization  10-18-11    7-8 yrs ago-mild blockage,no tx. indicated  . Prostatectomy  10/27/2011    Procedure: PROSTATECTOMY SUPRAPUBIC;  Surgeon: Ailene Rud, MD;  Location: [facility] ORS;  Service: Urology;  Laterality: N/A;  Placement of suprapubic tube  . Cystoscopy  10/27/2011    Procedure: CYSTOSCOPY FLEXIBLE;  Surgeon: Ailene Rud, MD;  Location: [facility] ORS;  Service: Urology;  Laterality: N/A;  . Transurethral resection of bladder tumor with gyrus (turbt-gyrus)  06/2014    T1 HG tumor.  [facility].    Family History  Problem Relation Age of Onset  . Breast cancer Sister   . Dementia Mother   . Heart attack Father   . Colon cancer Neg Hx     Social History:  reports that he quit smoking about 36 years ago. His smoking use included Cigarettes. He has a 5 pack-year smoking history. His smokeless tobacco use includes  Chew. He reports that he does not drink alcohol or use illicit drugs.  Allergies:  Allergies  Allergen Reactions  . Other Other (See Comments)    Pain med-causes hallucinations (unknown drug name)    Medications:  Prior to Admission:  Prescriptions prior to admission  Medication Sig Dispense Refill Last Dose  . acetaminophen (TYLENOL) 650 MG CR tablet Take 1,300 mg by mouth every 6 (six) hours as needed for pain.   Past Month at Unknown time  . acetaminophen-codeine (TYLENOL #3) 300-30 MG tablet Take 1 tablet by mouth daily as needed for moderate pain.    Past Week at  Unknown time  . b complex vitamins tablet Take 1 tablet by mouth daily.   02/14/2015 at Unknown time  . Calcium Citrate-Vitamin D (CALCIUM + D PO) Take 1 tablet by mouth daily.   02/14/2015 at Unknown time  . diltiazem (TIAZAC) 180 MG 24 hr capsule Take 180 mg by mouth daily.   02/14/2015 at Unknown time  . hydroxychloroquine (PLAQUENIL) 200 MG tablet Take 400 mg by mouth daily.   02/14/2015 at Unknown time  . hyoscyamine (LEVSIN SL) 0.125 MG SL tablet Take 0.125 mg by mouth every 4 (four) hours as needed (bladder spasms).    02/14/2015 at Unknown time  . Multiple Vitamin (MULTIVITAMIN WITH MINERALS) TABS tablet Take 1 tablet by mouth daily.   02/14/2015 at Unknown time  . polyethylene glycol powder (GLYCOLAX/MIRALAX) powder Take 255 g by mouth daily. (Patient taking differently: Take 17 g by mouth daily. ) 500 g 3 Past Week at Unknown time  . predniSONE (DELTASONE) 5 MG tablet Take 5-12.5 mg by mouth daily with breakfast. 10-18 to 10-31 12.5 mg, 11-1 to  11-14 10 mg, 11-15 to 11-29 7.5 mg, 11-30 to 12-13 5 mg then back to the MD for further directions.   02/14/2015 at Unknown time  . traMADol (ULTRAM) 50 MG tablet Take 50 mg by mouth every 6 (six) hours as needed for moderate pain.   Past Month at Unknown time   Scheduled: . antiseptic oral rinse  7 mL Mouth Rinse q12n4p  . chlorhexidine  15 mL Mouth Rinse BID  . predniSONE  10 mg Oral Q breakfast  . sodium chloride  3 mL Intravenous Q12H  . sodium chloride  3 mL Intravenous Q12H   Continuous: . dextrose 5 % and 0.45% NaCl 1,000 mL (02/18/15 1748)  . diltiazem (CARDIZEM) infusion 10 mg/hr (02/19/15 1200)   IOX:BDZHGD chloride, hydrALAZINE, iohexol, labetalol, morphine injection, ondansetron **OR** ondansetron (ZOFRAN) IV, oxyCODONE-acetaminophen, polyethylene glycol, simethicone, sodium chloride Anti-infectives    Start     Dose/Rate Route Frequency Ordered Stop   02/16/15 1030  ciprofloxacin (CIPRO) IVPB 400 mg     400 mg 200 mL/hr over  60 Minutes Intravenous  Once 02/16/15 0931 02/16/15 1052   02/16/15 0932  ciprofloxacin (CIPRO) 400 MG/200ML IVPB    Comments:  Margaretmary Dys   : cabinet override      02/16/15 0932 02/16/15 0950      Results for orders placed or performed during the hospital encounter of 02/15/15 (from the past 48 hour(s))  Renal function panel     Status: Abnormal   Collection Time: 02/18/15  3:50 AM  Result Value Ref Range   Sodium 143 135 - 145 mmol/L   Potassium 3.3 (L) 3.5 - 5.1 mmol/L    Comment: DELTA CHECK NOTED REPEATED TO VERIFY    Chloride 105 101 - 111 mmol/L   CO2 31  22 - 32 mmol/L   Glucose, Bld 169 (H) 65 - 99 mg/dL   BUN 49 (H) 6 - 20 mg/dL   Creatinine, Ser 3.24 (H) 0.61 - 1.24 mg/dL    Comment: DELTA CHECK NOTED REPEATED TO VERIFY    Calcium 7.5 (L) 8.9 - 10.3 mg/dL   Phosphorus 4.7 (H) 2.5 - 4.6 mg/dL   Albumin 2.2 (L) 3.5 - 5.0 g/dL   GFR calc non Af Amer 16 (L) >60 mL/min   GFR calc Af Amer 19 (L) >60 mL/min    Comment: (NOTE) The eGFR has been calculated using the CKD EPI equation. This calculation has not been validated in all clinical situations. eGFR's persistently <60 mL/min signify possible Chronic Kidney Disease.    Anion gap 7 5 - 15  CBC     Status: Abnormal   Collection Time: 02/18/15  3:50 AM  Result Value Ref Range   WBC 9.8 4.0 - 10.5 K/uL   RBC 3.54 (L) 4.22 - 5.81 MIL/uL   Hemoglobin 10.4 (L) 13.0 - 17.0 g/dL   HCT 33.5 (L) 39.0 - 52.0 %   MCV 94.6 78.0 - 100.0 fL   MCH 29.4 26.0 - 34.0 pg   MCHC 31.0 30.0 - 36.0 g/dL   RDW 14.8 11.5 - 15.5 %   Platelets 94 (L) 150 - 400 K/uL    Comment: CONSISTENT WITH PREVIOUS RESULT  Basic metabolic panel     Status: Abnormal   Collection Time: 02/19/15  4:08 AM  Result Value Ref Range   Sodium 144 135 - 145 mmol/L   Potassium 3.5 3.5 - 5.1 mmol/L   Chloride 111 101 - 111 mmol/L   CO2 27 22 - 32 mmol/L   Glucose, Bld 169 (H) 65 - 99 mg/dL   BUN 29 (H) 6 - 20 mg/dL   Creatinine, Ser 1.85 (H) 0.61 - 1.24  mg/dL    Comment: DELTA CHECK NOTED REPEATED TO VERIFY    Calcium 7.8 (L) 8.9 - 10.3 mg/dL   GFR calc non Af Amer 32 (L) >60 mL/min   GFR calc Af Amer 37 (L) >60 mL/min    Comment: (NOTE) The eGFR has been calculated using the CKD EPI equation. This calculation has not been validated in all clinical situations. eGFR's persistently <60 mL/min signify possible Chronic Kidney Disease.    Anion gap 6 5 - 15  CBC     Status: Abnormal   Collection Time: 02/19/15  4:08 AM  Result Value Ref Range   WBC 9.8 4.0 - 10.5 K/uL   RBC 3.56 (L) 4.22 - 5.81 MIL/uL   Hemoglobin 10.6 (L) 13.0 - 17.0 g/dL   HCT 33.6 (L) 39.0 - 52.0 %   MCV 94.4 78.0 - 100.0 fL   MCH 29.8 26.0 - 34.0 pg   MCHC 31.5 30.0 - 36.0 g/dL   RDW 14.4 11.5 - 15.5 %   Platelets 104 (L) 150 - 400 K/uL    Comment: CONSISTENT WITH PREVIOUS RESULT    Dg Abd Portable 1v  02/19/2015  CLINICAL DATA:  Abdominal pain, nausea, constipation EXAM: PORTABLE ABDOMEN - 1 VIEW COMPARISON:  None. FINDINGS: Left nephrostomy catheter is noted. Gaseous distended small bowel loops on mid abdomen highly suspicious for significant ileus or partial small bowel obstruction. Stool and gas noted in distal colon. IMPRESSION: Gaseous distended small bowel loops mid abdomen highly suspicious for significant ileus or bowel obstruction. Electronically Signed   By: Lahoma Crocker M.D.   On: 02/19/2015  14:10    Review of Systems  Gastrointestinal: Negative for constipation (small BM yesterday).  All other systems reviewed and are negative.  Blood pressure 185/84, pulse 72, temperature 98.7 F (37.1 C), temperature source Oral, resp. rate 19, height 5' 11"  (1.803 m), weight 99.8 kg (220 lb 0.3 oz), SpO2 98 %. Physical Exam  Constitutional: He is oriented to person, place, and time. He appears well-developed and well-nourished. He appears distressed (getting enema currently).  HENT:  Head: Normocephalic and atraumatic.  Eyes: Right eye exhibits no discharge.  Left eye exhibits no discharge. No scleral icterus.  Neck: No tracheal deviation present. No thyromegaly present.  Cardiovascular: Regular rhythm and normal heart sounds.   No murmur heard. Respiratory: Effort normal and breath sounds normal. No respiratory distress. He has no wheezes. He has no rales. He exhibits no tenderness.  GI: He exhibits distension. He exhibits no mass. There is no tenderness. There is no rebound and no guarding.  Musculoskeletal: He exhibits no edema.  Neurological: He is alert and oriented to person, place, and time. No cranial nerve deficit.  Skin: Skin is warm and dry. No rash noted. No erythema. No pallor.  Psychiatric: He has a normal mood and affect. His behavior is normal. Judgment and thought content normal.    Assessment/Plan: Abdominal distension, most likely ileus, vs SBO Urothelial cell bladder cancer with partial resection and know metastasis  Rheumatoid arthritis on immunosuppressive and steroid therapy PAF AODM Hypertension Hx of diverticulitis Hx of GERD  Plan:  i will check her BMP and see where his K+ is. He is getting an enema right now, he has had a BM as late as yesterday.  We will also repeat film in AM.. I would keep him on NPO if he has nausea, or clears if he can tolerate them.  If he has any nausea or vomiting I would place an NG and put him on low suction.  We will follow with you.   Tiffanny Lamarche 02/19/2015, 3:10 PM

## 2015-02-20 ENCOUNTER — Inpatient Hospital Stay (HOSPITAL_COMMUNITY): Payer: Medicare Other

## 2015-02-20 LAB — BASIC METABOLIC PANEL
ANION GAP: 6 (ref 5–15)
BUN: 18 mg/dL (ref 6–20)
CALCIUM: 7.9 mg/dL — AB (ref 8.9–10.3)
CO2: 25 mmol/L (ref 22–32)
Chloride: 109 mmol/L (ref 101–111)
Creatinine, Ser: 1.45 mg/dL — ABNORMAL HIGH (ref 0.61–1.24)
GFR calc Af Amer: 50 mL/min — ABNORMAL LOW (ref >60)
GFR calc non Af Amer: 43 mL/min — ABNORMAL LOW (ref >60)
GLUCOSE: 173 mg/dL — AB (ref 65–99)
POTASSIUM: 3 mmol/L — AB (ref 3.5–5.1)
Sodium: 140 mmol/L (ref 135–145)

## 2015-02-20 LAB — MAGNESIUM: Magnesium: 1.7 mg/dL (ref 1.7–2.4)

## 2015-02-20 LAB — CBC
HEMATOCRIT: 35.4 % — AB (ref 39.0–52.0)
HEMOGLOBIN: 11.4 g/dL — AB (ref 13.0–17.0)
MCH: 30.1 pg (ref 26.0–34.0)
MCHC: 32.2 g/dL (ref 30.0–36.0)
MCV: 93.4 fL (ref 78.0–100.0)
Platelets: 130 10*3/uL — ABNORMAL LOW (ref 150–400)
RBC: 3.79 MIL/uL — ABNORMAL LOW (ref 4.22–5.81)
RDW: 14.2 % (ref 11.5–15.5)
WBC: 10.5 10*3/uL (ref 4.0–10.5)

## 2015-02-20 MED ORDER — POTASSIUM CHLORIDE 10 MEQ/100ML IV SOLN
10.0000 meq | INTRAVENOUS | Status: AC
  Administered 2015-02-20 (×5): 10 meq via INTRAVENOUS
  Filled 2015-02-20 (×4): qty 100

## 2015-02-20 MED ORDER — MAGNESIUM SULFATE 2 GM/50ML IV SOLN
2.0000 g | Freq: Once | INTRAVENOUS | Status: AC
  Administered 2015-02-20: 2 g via INTRAVENOUS
  Filled 2015-02-20: qty 50

## 2015-02-20 NOTE — Progress Notes (Signed)
  Subjective Feels normal now. Having flatus and had a bm with enema, wants his coffee   Objective: Vital signs in last 24 hours: Temp:  [98.4 F (36.9 C)-98.9 F (37.2 C)] 98.4 F (36.9 C) (10/29 0400) Resp:  [15-29] 16 (10/29 0500) BP: (125-187)/(45-125) 126/45 mmHg (10/29 0500) SpO2:  [88 %-98 %] 95 % (10/29 0500) Last BM Date: 02/18/15  Intake/Output from previous day: 10/28 0701 - 10/29 0700 In: 2036.5 [I.V.:2006.5] Out: 3760 [Urine:3760] Intake/Output this shift:    GI: bs present, soft nontender  Lab Results:   Recent Labs  02/19/15 0408 02/20/15 0355  WBC 9.8 10.5  HGB 10.6* 11.4*  HCT 33.6* 35.4*  PLT 104* 130*   BMET  Recent Labs  02/19/15 1700 02/20/15 0355  NA 139 140  K 3.5 3.0*  CL 109 109  CO2 26 25  GLUCOSE 195* 173*  BUN 24* 18  CREATININE 1.71* 1.45*  CALCIUM 7.9* 7.9*   PT/INR No results for input(s): LABPROT, INR in the last 72 hours. ABG No results for input(s): PHART, HCO3 in the last 72 hours.  Invalid input(s): PCO2, PO2  Studies/Results: Dg Abd Portable 1v  02/19/2015  CLINICAL DATA:  Abdominal pain, nausea, constipation EXAM: PORTABLE ABDOMEN - 1 VIEW COMPARISON:  None. FINDINGS: Left nephrostomy catheter is noted. Gaseous distended small bowel loops on mid abdomen highly suspicious for significant ileus or partial small bowel obstruction. Stool and gas noted in distal colon. IMPRESSION: Gaseous distended small bowel loops mid abdomen highly suspicious for significant ileus or bowel obstruction. Electronically Signed   By: Lahoma Crocker M.D.   On: 02/19/2015 14:10    Anti-infectives: Anti-infectives    Start     Dose/Rate Route Frequency Ordered Stop   02/16/15 1030  ciprofloxacin (CIPRO) IVPB 400 mg     400 mg 200 mL/hr over 60 Minutes Intravenous  Once 02/16/15 0931 02/16/15 1052   02/16/15 0932  ciprofloxacin (CIPRO) 400 MG/200ML IVPB    Comments:  Craig Waters   : cabinet override      02/16/15 0932 02/16/15 0950       Assessment/Plan: Likely ileus  Will give him clears this am and see how this goes Replace potassium  [facility] 02/20/2015

## 2015-02-20 NOTE — Progress Notes (Signed)
[facility] PROGRESS NOTE  Craig Waters. ZYS:[id number] DOB: [birth year] DOA: 02/15/2015 PCP: [name], MD  HPI/Brief narrative Craig Waters. is an 81 y.o. male with a PMH of rheumatoid arthritis on chronic prednisone and other immunosuppressive therapy, high-grade urothelial cell carcinoma, under the care of Dr. [name] at [facility], who has scheduled him for a palliative surgical procedure for 02/17/15 but who presented with generalized weakness, failure to thrive, nausea, loss of appetite, and an unsteady gait. Pt was found to have Cr of 12 and K of 7.5  Assessment/Plan: 1. AKI -suspect progressive since March'16, due to progressive obstruction and poor Po intake -Urine output initially very poor but has since picked up markedly with 2.4L out over past 24hrs -Bilateral hydronephrosis noted on CT -Renal was consulted and pt underwent HD cath and Perc Tubes placement on 10/25 -With improving renal function and increased urine output, pt has avoided transfer to [facility] for dialysis  - Renal function further improved with nephrostomy tube and continues to head towards normal  2. Persistent Hyperkalemia/Hypokalemia -due to #1 -no peak T waves or arrhthymias since admission -will replace -Replace Mg as well  3. Bladder CA -currently under the care of Dr. [name] at [facility], he was notified of admission per EDP yesterday -Urology was consulted. Pt is s/p resection 07/07/14 and had a follow-up cystoscopy 11/16/14 showing persistent disease, was scheduled for cystoscopy, TURBT/ vaporization with possible intravesical epirubicin -Pt now s/p L nephrostomy placement, R could not be placed secondary to agitation -Urology now with recs to hold off on stone extraction and to f/u at [facility] for re-resection and to leave WITH nephrostomy tube in place  4. Uremic encephalopathy -Seems resolved  5. H/o RA on steroids -being tapered, currently on 10mg  daily, continue same  6. DVT  proph: SCD's  7. Afib with RVR - CHADS-VASc of 3 - Given thrombocytopenia and hematuria, holding off on anticoagulation at this time - Lytes normal. 2d echo unremarkable - TSH on 05/15/14 w/in normal limits - Rate controlled on cardizem gtt - When able to tolerate PO reliably, would transition back to home cardizem dose  8. Thrombocytopenia - Heparin stopped per above - follow CBC trends  9. Ileus, Bowel Obstruction - Abd more distended with abd Xray demonstrating gaseous distension suspicious for ileus vs bowel obstruction - Stool was noted distally on imaging - Large reported BM with soap suds enema overnight - General Surgery consulted and is following - Trials of clears today per Surgery  10. Hypomagnesemia - Will replace to keep over 2  Code Status: Full Family Communication: Pt in room Disposition Plan: Pending  Consultants:  Nephrology  Urology  IR  General Surgery  Procedures:  HD cath placement 10/25  L Nephrostomy placement 10/25 (R side not done secondary to increased agitation)  Antibiotics: Anti-infectives    Start     Dose/Rate Route Frequency Ordered Stop   02/16/15 1030  ciprofloxacin (CIPRO) IVPB 400 mg     400 mg 200 mL/hr over 60 Minutes Intravenous  Once 02/16/15 0931 02/16/15 1052   02/16/15 0932  ciprofloxacin (CIPRO) 400 MG/200ML IVPB    Comments:  Margaretmary Dys   : cabinet override      02/16/15 0932 02/16/15 0950      HPI/Subjective: Reports feeling better today. Reports passing stool and flatus overnight  Objective: Filed Vitals:   02/20/15 0300 02/20/15 0400 02/20/15 0500 02/20/15 0800  BP: 139/56 125/64 126/45   Pulse:      Temp:  98.4 F (36.9 C)  98.3 F (36.8 C)  TempSrc:  Oral  Oral  Resp: 16 15 16    Height:      Weight:      SpO2: 97% 94% 95%     Intake/Output Summary (Last 24 hours) at 02/20/15 1310 Last data filed at 02/20/15 0600  Gross per 24 hour  Intake 1542.5 ml  Output   3035 ml  Net -1492.5 ml    Filed Weights   02/15/15 2107 02/18/15 0846  Weight: 101.6 kg (223 lb 15.8 oz) 99.8 kg (220 lb 0.3 oz)    Exam:   General:  Awake, laying in bed, in nad  Cardiovascular: regular, s1, s2  Respiratory: normal resp effort, no wheezing  Abdomen: soft, good bowel sounds, nontender  Musculoskeletal: perfused, no clubbing, no cyanosis   Data Reviewed: Basic Metabolic Panel:  Recent Labs Lab 02/16/15 1756 02/17/15 0225 02/17/15 0645 02/18/15 0350 02/19/15 0408 02/19/15 1700 02/20/15 0355  NA 140 141 145 143 144 139 140  K 6.1* 4.5 4.2 3.3* 3.5 3.5 3.0*  CL 102 104 105 105 111 109 109  CO2 24 28 30 31 27 26 25   GLUCOSE 136* 180* 185* 169* 169* 195* 173*  BUN 100* 81* 76* 49* 29* 24* 18  CREATININE 10.12* 7.24* 6.36* 3.24* 1.85* 1.71* 1.45*  CALCIUM 8.7* 8.1* 8.2* 7.5* 7.8* 7.9* 7.9*  MG  --   --  2.2  --   --   --  1.7  PHOS 5.9* 5.5* 5.6* 4.7*  --   --   --    Liver Function Tests:  Recent Labs Lab 02/16/15 1756 02/17/15 0225 02/17/15 0645 02/18/15 0350  ALBUMIN 2.7* 2.6* 2.4* 2.2*   No results for input(s): LIPASE, AMYLASE in the last 168 hours. No results for input(s): AMMONIA in the last 168 hours. CBC:  Recent Labs Lab 02/15/15 1206 02/16/15 0735 02/17/15 0225 02/18/15 0350 02/19/15 0408 02/20/15 0355  WBC 18.4* 16.3* 9.8 9.8 9.8 10.5  NEUTROABS 15.8*  --   --   --   --   --   HGB 14.7 14.2 12.3* 10.4* 10.6* 11.4*  HCT 43.7 42.7 38.0* 33.5* 33.6* 35.4*  MCV 90.5 91.6 93.4 94.6 94.4 93.4  PLT 133* 128* 117* 94* 104* 130*   Cardiac Enzymes:  Recent Labs Lab 02/15/15 1206  TROPONINI 0.12*   BNP (last 3 results)  Recent Labs  02/15/15 1206  BNP 522.9*    ProBNP (last 3 results) No results for input(s): PROBNP in the last 8760 hours.  CBG: No results for input(s): GLUCAP in the last 168 hours.  Recent Results (from the past 240 hour(s))  MRSA PCR Screening     Status: None   Collection Time: 02/15/15  7:54 PM  Result Value Ref  Range Status   MRSA by PCR NEGATIVE NEGATIVE Final    Comment:        The GeneXpert MRSA Assay (FDA approved for NASAL specimens only), is one component of a comprehensive MRSA colonization surveillance program. It is not intended to diagnose MRSA infection nor to guide or monitor treatment for MRSA infections.      Studies: Dg Abd 2 Views  02/20/2015  CLINICAL DATA:  Abdominal pain, follow-up ileus, status post nephrectomy EXAM: ABDOMEN - 2 VIEW COMPARISON:  02/19/2015 FINDINGS: Mild gaseous distention of small and large bowel, compatible with history of postoperative adynamic ileus. No evidence of bowel obstruction. Left percutaneous nephrostomy. Degenerative changes the lumbar spine. IMPRESSION: No  evidence of bowel obstruction. Mild gaseous distention of small and large bowel, compatible with history of postoperative adynamic ileus. Electronically Signed   By: Julian Hy M.D.   On: 02/20/2015 12:28   Dg Abd Portable 1v  02/19/2015  CLINICAL DATA:  Abdominal pain, nausea, constipation EXAM: PORTABLE ABDOMEN - 1 VIEW COMPARISON:  None. FINDINGS: Left nephrostomy catheter is noted. Gaseous distended small bowel loops on mid abdomen highly suspicious for significant ileus or partial small bowel obstruction. Stool and gas noted in distal colon. IMPRESSION: Gaseous distended small bowel loops mid abdomen highly suspicious for significant ileus or bowel obstruction. Electronically Signed   By: Lahoma Crocker M.D.   On: 02/19/2015 14:10    Scheduled Meds: . antiseptic oral rinse  7 mL Mouth Rinse q12n4p  . chlorhexidine  15 mL Mouth Rinse BID  . magnesium sulfate 1 - 4 g bolus IVPB  2 g Intravenous Once  . potassium chloride  10 mEq Intravenous Q1 Hr x 5  . predniSONE  10 mg Oral Q breakfast  . sodium chloride  3 mL Intravenous Q12H  . sodium chloride  3 mL Intravenous Q12H   Continuous Infusions: . dextrose 5 % and 0.45% NaCl 75 mL/hr at 02/19/15 2104  . diltiazem (CARDIZEM)  infusion 15 mg/hr (02/20/15 0830)    Principal Problem:   Acute renal failure (ARF) (HCC) Active Problems:   Encephalopathy acute   HTN (hypertension)   Urothelial carcinoma (HCC)   Rheumatoid arthritis (HCC)   Hyperkalemia   Metabolic acidosis   Obstructive uropathy   Bilateral hydronephrosis   Left nephrolithiasis   Acute renal failure (HCC)   Atrial fibrillation, rapid ([place])   Hydronephrosis, right    [facility], [place] [facility] Pager [phone]. If 7PM-7AM, please contact night-coverage at [email or web address], password [facility] 02/20/2015, 1:10 PM  LOS: 5 days

## 2015-02-21 LAB — BASIC METABOLIC PANEL
ANION GAP: 8 (ref 5–15)
BUN: 13 mg/dL (ref 6–20)
CALCIUM: 8.1 mg/dL — AB (ref 8.9–10.3)
CO2: 24 mmol/L (ref 22–32)
Chloride: 109 mmol/L (ref 101–111)
Creatinine, Ser: 1.37 mg/dL — ABNORMAL HIGH (ref 0.61–1.24)
GFR calc Af Amer: 54 mL/min — ABNORMAL LOW (ref >60)
GFR, EST NON AFRICAN AMERICAN: 46 mL/min — AB (ref >60)
GLUCOSE: 168 mg/dL — AB (ref 65–99)
Potassium: 3.3 mmol/L — ABNORMAL LOW (ref 3.5–5.1)
Sodium: 141 mmol/L (ref 135–145)

## 2015-02-21 MED ORDER — POTASSIUM CHLORIDE CRYS ER 20 MEQ PO TBCR
40.0000 meq | EXTENDED_RELEASE_TABLET | Freq: Two times a day (BID) | ORAL | Status: DC
  Administered 2015-02-21 – 2015-02-22 (×3): 40 meq via ORAL
  Filled 2015-02-21 (×3): qty 2

## 2015-02-21 MED ORDER — POTASSIUM CHLORIDE 10 MEQ/100ML IV SOLN
10.0000 meq | INTRAVENOUS | Status: AC
  Administered 2015-02-21 (×4): 10 meq via INTRAVENOUS
  Filled 2015-02-21: qty 100

## 2015-02-21 MED ORDER — DILTIAZEM HCL ER COATED BEADS 180 MG PO CP24
180.0000 mg | ORAL_CAPSULE | Freq: Every day | ORAL | Status: DC
  Administered 2015-02-21 – 2015-02-22 (×2): 180 mg via ORAL
  Filled 2015-02-21 (×2): qty 1

## 2015-02-21 NOTE — Progress Notes (Signed)
[facility] PROGRESS NOTE  Craig Waters. QJJ:[id number] DOB: October 29, 1930 DOA: 02/15/2015 PCP: [name], MD  HPI/Brief narrative Craig Waters. is an 81 y.o. male with a PMH of rheumatoid arthritis on chronic prednisone and other immunosuppressive therapy, high-grade urothelial cell carcinoma, under the care of Dr. [name] at [facility], who has scheduled him for a palliative surgical procedure for 02/17/15 but who presented with generalized weakness, failure to thrive, nausea, loss of appetite, and an unsteady gait. Pt was found to have Cr of 12 and K of 7.5  Assessment/Plan: 1. AKI -suspect progressive since March'16, due to progressive obstruction and poor Po intake -Bilateral hydronephrosis noted on CT -Renal was consulted and pt underwent HD cath and Perc Tubes placement on 10/25 -With improving renal function and increased urine output, pt has avoided transfer to [facility] for dialysis  - Renal function further improved with nephrostomy tube and continues to head towards normal  2. Persistent Hyperkalemia/Hypokalemia -due to #1 -no peak T waves or arrhthymias since admission -replaced  3. Bladder CA -currently under the care of Dr. [name] at [facility], he was notified of admission per EDP yesterday -Urology was consulted. Pt is s/p resection 07/07/14 and had a follow-up cystoscopy 11/16/14 showing persistent disease, was scheduled for cystoscopy, TURBT/ vaporization with possible intravesical epirubicin -Pt now s/p L nephrostomy placement, R could not be placed secondary to agitation -Urology now with recs to hold off on stone extraction and to f/u at [facility] for re-resection and to leave WITH nephrostomy tube in place  4. Uremic encephalopathy -Seems resolved and pt at baseline  5. H/o RA on steroids -being tapered, currently on 10mg  daily, continue same  6. DVT proph: SCD's  7. Afib with RVR - CHADS-VASc of 3 - Given thrombocytopenia and hematuria, holding off  on anticoagulation at this time - Lytes normal. 2d echo unremarkable - TSH on 05/15/14 w/in normal limits - Rate controlled on cardizem gtt - When able to tolerate PO reliably, would transition back to home cardizem dose  8. Thrombocytopenia - Heparin was stopped per above - follow CBC trends  9. Ileus, Bowel Obstruction - Abd more distended with abd Xray demonstrating gaseous distension suspicious for ileus vs bowel obstruction - Stool was noted distally on imaging - Large reported BM with soap suds enema  - General Surgery consulted and is following - Passing flatus and gas. Seems to be improving. Advancing diet as tolerated  10. Hypomagnesemia - Will replace to keep over 2  Code Status: Full Family Communication: Pt in room, family at bedside Disposition Plan: Pending  Consultants:  Nephrology  Urology  IR  General Surgery  Procedures:  HD cath placement 10/25  L Nephrostomy placement 10/25 (R side not done secondary to increased agitation)  Antibiotics: Anti-infectives    Start     Dose/Rate Route Frequency Ordered Stop   02/16/15 1030  ciprofloxacin (CIPRO) IVPB 400 mg     400 mg 200 mL/hr over 60 Minutes Intravenous  Once 02/16/15 0931 02/16/15 1052   02/16/15 0932  ciprofloxacin (CIPRO) 400 MG/200ML IVPB    Comments:  Margaretmary Dys   : cabinet override      02/16/15 0932 02/16/15 0950      HPI/Subjective: Feels better today. Passing flatus and tolerating clears  Objective: Filed Vitals:   02/21/15 1030 02/21/15 1100 02/21/15 1130 02/21/15 1200  BP:  176/80  176/68  Pulse:      Temp:    98.9 F (37.2 C)  TempSrc:  Oral  Resp: 20 16 18 18   Height:      Weight:      SpO2: 93% 94% 96% 87%    Intake/Output Summary (Last 24 hours) at 02/21/15 1432 Last data filed at 02/21/15 1315  Gross per 24 hour  Intake   3320 ml  Output   2895 ml  Net    425 ml   Filed Weights   02/15/15 2107 02/18/15 0846 02/21/15 0400  Weight: 101.6 kg (223 lb 15.8  oz) 99.8 kg (220 lb 0.3 oz) 96.3 kg (212 lb 4.9 oz)    Exam:   General:  Awake, laying in bed, in nad  Cardiovascular: regular, s1, s2  Respiratory: normal resp effort, no wheezing  Abdomen: soft, pos bowel sounds, less distended  Musculoskeletal: perfused, no clubbing, no cyanosis   Data Reviewed: Basic Metabolic Panel:  Recent Labs Lab 02/16/15 1756 02/17/15 0225 02/17/15 0645 02/18/15 0350 02/19/15 0408 02/19/15 1700 02/20/15 0355 02/21/15 0338  NA 140 141 145 143 144 139 140 141  K 6.1* 4.5 4.2 3.3* 3.5 3.5 3.0* 3.3*  CL 102 104 105 105 111 109 109 109  CO2 24 28 30 31 27 26 25 24   GLUCOSE 136* 180* 185* 169* 169* 195* 173* 168*  BUN 100* 81* 76* 49* 29* 24* 18 13  CREATININE 10.12* 7.24* 6.36* 3.24* 1.85* 1.71* 1.45* 1.37*  CALCIUM 8.7* 8.1* 8.2* 7.5* 7.8* 7.9* 7.9* 8.1*  MG  --   --  2.2  --   --   --  1.7  --   PHOS 5.9* 5.5* 5.6* 4.7*  --   --   --   --    Liver Function Tests:  Recent Labs Lab 02/16/15 1756 02/17/15 0225 02/17/15 0645 02/18/15 0350  ALBUMIN 2.7* 2.6* 2.4* 2.2*   No results for input(s): LIPASE, AMYLASE in the last 168 hours. No results for input(s): AMMONIA in the last 168 hours. CBC:  Recent Labs Lab 02/15/15 1206 02/16/15 0735 02/17/15 0225 02/18/15 0350 02/19/15 0408 02/20/15 0355  WBC 18.4* 16.3* 9.8 9.8 9.8 10.5  NEUTROABS 15.8*  --   --   --   --   --   HGB 14.7 14.2 12.3* 10.4* 10.6* 11.4*  HCT 43.7 42.7 38.0* 33.5* 33.6* 35.4*  MCV 90.5 91.6 93.4 94.6 94.4 93.4  PLT 133* 128* 117* 94* 104* 130*   Cardiac Enzymes:  Recent Labs Lab 02/15/15 1206  TROPONINI 0.12*   BNP (last 3 results)  Recent Labs  02/15/15 1206  BNP 522.9*    ProBNP (last 3 results) No results for input(s): PROBNP in the last 8760 hours.  CBG: No results for input(s): GLUCAP in the last 168 hours.  Recent Results (from the past 240 hour(s))  MRSA PCR Screening     Status: None   Collection Time: 02/15/15  7:54 PM  Result  Value Ref Range Status   MRSA by PCR NEGATIVE NEGATIVE Final    Comment:        The GeneXpert MRSA Assay (FDA approved for NASAL specimens only), is one component of a comprehensive MRSA colonization surveillance program. It is not intended to diagnose MRSA infection nor to guide or monitor treatment for MRSA infections.      Studies: Dg Abd 2 Views  02/20/2015  CLINICAL DATA:  Abdominal pain, follow-up ileus, status post nephrectomy EXAM: ABDOMEN - 2 VIEW COMPARISON:  02/19/2015 FINDINGS: Mild gaseous distention of small and large bowel, compatible with history of postoperative adynamic  ileus. No evidence of bowel obstruction. Left percutaneous nephrostomy. Degenerative changes the lumbar spine. IMPRESSION: No evidence of bowel obstruction. Mild gaseous distention of small and large bowel, compatible with history of postoperative adynamic ileus. Electronically Signed   By: Julian Hy M.D.   On: 02/20/2015 12:28    Scheduled Meds: . antiseptic oral rinse  7 mL Mouth Rinse q12n4p  . chlorhexidine  15 mL Mouth Rinse BID  . potassium chloride  40 mEq Oral BID  . predniSONE  10 mg Oral Q breakfast  . sodium chloride  3 mL Intravenous Q12H  . sodium chloride  3 mL Intravenous Q12H   Continuous Infusions: . dextrose 5 % and 0.45% NaCl 75 mL/hr at 02/21/15 0618  . diltiazem (CARDIZEM) infusion 10 mg/hr (02/21/15 1014)    Principal Problem:   Acute renal failure (ARF) (HCC) Active Problems:   Encephalopathy acute   HTN (hypertension)   Urothelial carcinoma (HCC)   Rheumatoid arthritis (HCC)   Hyperkalemia   Metabolic acidosis   Obstructive uropathy   Bilateral hydronephrosis   Left nephrolithiasis   Acute renal failure (HCC)   Atrial fibrillation, rapid ([place])   Hydronephrosis, right    Ryleeann Urquiza, [place] [facility] Pager [phone]. If 7PM-7AM, please contact night-coverage at [email or web address], password [facility] 02/21/2015, 2:32 PM  LOS: 6 days

## 2015-02-21 NOTE — Progress Notes (Signed)
Patient ID: Craig Drew., male   DOB: 10/07/1930, 81 y.o.   MRN: [id number]    Subjective Continues to have flatus.     Objective: Vital signs in last 24 hours: Temp:  [98 F (36.7 C)-98.9 F (37.2 C)] 98 F (36.7 C) (10/30 0400) Resp:  [11-22] 15 (10/30 0700) BP: (115-172)/(47-89) 143/58 mmHg (10/30 0700) SpO2:  [92 %-98 %] 94 % (10/30 0700) Weight:  [96.3 kg (212 lb 4.9 oz)] 96.3 kg (212 lb 4.9 oz) (10/30 0400) Last BM Date: 02/18/15  Intake/Output from previous day: 12-Mar-2023 0701 - 10/30 0700 In: 2185 [I.V.:1110] Out: 2035 [Urine:2035] Intake/Output this shift:    GI - soft, non distended, non tender.    Lab Results:   Recent Labs  02/19/15 0408 03/12/15 0355  WBC 9.8 10.5  HGB 10.6* 11.4*  HCT 33.6* 35.4*  PLT 104* 130*   BMET  Recent Labs  03-12-2015 0355 02/21/15 0338  NA 140 141  K 3.0* 3.3*  CL 109 109  CO2 25 24  GLUCOSE 173* 168*  BUN 18 13  CREATININE 1.45* 1.37*  CALCIUM 7.9* 8.1*   PT/INR No results for input(s): LABPROT, INR in the last 72 hours. ABG No results for input(s): PHART, HCO3 in the last 72 hours.  Invalid input(s): PCO2, PO2  Studies/Results: Dg Abd 2 Views  2015/03/12  CLINICAL DATA:  Abdominal pain, follow-up ileus, status post nephrectomy EXAM: ABDOMEN - 2 VIEW COMPARISON:  02/19/2015 FINDINGS: Mild gaseous distention of small and large bowel, compatible with history of postoperative adynamic ileus. No evidence of bowel obstruction. Left percutaneous nephrostomy. Degenerative changes the lumbar spine. IMPRESSION: No evidence of bowel obstruction. Mild gaseous distention of small and large bowel, compatible with history of postoperative adynamic ileus. Electronically Signed   By: Julian Hy M.D.   On: 12-Mar-2015 12:28   Dg Abd Portable 1v  02/19/2015  CLINICAL DATA:  Abdominal pain, nausea, constipation EXAM: PORTABLE ABDOMEN - 1 VIEW COMPARISON:  None. FINDINGS: Left nephrostomy catheter is noted. Gaseous distended  small bowel loops on mid abdomen highly suspicious for significant ileus or partial small bowel obstruction. Stool and gas noted in distal colon. IMPRESSION: Gaseous distended small bowel loops mid abdomen highly suspicious for significant ileus or bowel obstruction. Electronically Signed   By: Lahoma Crocker M.D.   On: 02/19/2015 14:10    Anti-infectives: Anti-infectives    Start     Dose/Rate Route Frequency Ordered Stop   02/16/15 1030  ciprofloxacin (CIPRO) IVPB 400 mg     400 mg 200 mL/hr over 60 Minutes Intravenous  Once 02/16/15 0931 02/16/15 1052   02/16/15 0932  ciprofloxacin (CIPRO) 400 MG/200ML IVPB    Comments:  Margaretmary Dys   : cabinet override      02/16/15 0932 02/16/15 0950      Assessment/Plan: Ileus  Advance diet.    Replete potassium again.    Craig Waters 02/21/2015

## 2015-02-21 NOTE — Evaluation (Signed)
Physical Therapy Evaluation Patient Details Name: Craig Waters. MRN: [id number] DOB: [birth year] Today's Date: 02/21/2015   History of Present Illness  Pt admitted through ED with acute renal failure and hs of vertigo, RW and bladder CA  Clinical Impression  Pt admitted as above and s/p nephrostomy with R side drain presents with functional mobility limitations 2* generalized weakness, mild ambulatory balance issues and ltd endurance.  Pt should progress to return home with family assist.    Follow Up Recommendations No PT follow up    Equipment Recommendations  None recommended by PT    Recommendations for Other Services       Precautions / Restrictions Precautions Precautions: Fall Precaution Comments: Nephrostomy drain on L Restrictions Weight Bearing Restrictions: No      Mobility  Bed Mobility Overal bed mobility: Needs Assistance Bed Mobility: Sit to Supine       Sit to supine: Min assist   General bed mobility comments: min assist with LEs into high bed  Transfers Overall transfer level: Needs assistance Equipment used: None Transfers: Sit to/from Stand Sit to Stand: Min guard         General transfer comment: cues for saftey, transition postion and use of UEs to self assist  Ambulation/Gait Ambulation/Gait assistance: Min assist Ambulation Distance (Feet): 5 Feet (5' twice bed to [facility] to bed) Assistive device: None Gait Pattern/deviations: Step-through pattern;Decreased step length - right;Decreased step length - left;Shuffle     General Gait Details: cues for saftey awareness with move between surfaces - pt impulsive and with ltd tolerance 2* ongoing bowel issues  Stairs            Wheelchair Mobility    Modified Rankin (Stroke Patients Only)       Balance                                             Pertinent Vitals/Pain Pain Assessment: No/denies pain    Home Living Family/patient expects to be discharged  to:: Private residence Living Arrangements: Children Available Help at Discharge: Family Type of Home: House Home Access: Level entry     Home Layout: One level Home Equipment: Environmental consultant - 2 wheels;Cane - single point      Prior Function Level of Independence: Independent               Hand Dominance        Extremity/Trunk Assessment   Upper Extremity Assessment: Generalized weakness           Lower Extremity Assessment: Generalized weakness         Communication   Communication: No difficulties  Cognition Arousal/Alertness: Awake/alert Behavior During Therapy: WFL for tasks assessed/performed Overall Cognitive Status: Within Functional Limits for tasks assessed                      General Comments      Exercises        Assessment/Plan    PT Assessment Patient needs continued PT services  PT Diagnosis Difficulty walking   PT Problem List Decreased strength;Decreased activity tolerance;Decreased balance;Decreased mobility;Decreased knowledge of use of DME;Decreased safety awareness  PT Treatment Interventions DME instruction;Gait training;Functional mobility training;Therapeutic activities;Therapeutic exercise;Patient/family education   PT Goals (Current goals can be found in the Care Plan section) Acute Rehab PT Goals Patient Stated Goal: Home with dtr PT Goal  Formulation: With patient Time For Goal Achievement: 03/06/15 Potential to Achieve Goals: Good    Frequency Min 3X/week   Barriers to discharge        Co-evaluation               End of Session   Activity Tolerance: Other (comment);Patient limited by fatigue (bowel issues) Patient left: in bed;with call bell/phone within reach;with nursing/sitter in room Nurse Communication: Mobility status         Time: 9892-1194 PT Time Calculation (min) (ACUTE ONLY): 20 min   Charges:   PT Evaluation $Initial PT Evaluation Tier I: 1 Procedure     PT G Codes:         Marlinda Miranda 02/21/2015, 1:42 PM

## 2015-02-22 DIAGNOSIS — N200 Calculus of kidney: Secondary | ICD-10-CM

## 2015-02-22 DIAGNOSIS — I10 Essential (primary) hypertension: Secondary | ICD-10-CM

## 2015-02-22 DIAGNOSIS — C689 Malignant neoplasm of urinary organ, unspecified: Secondary | ICD-10-CM

## 2015-02-22 LAB — BASIC METABOLIC PANEL
Anion gap: 6 (ref 5–15)
BUN: 15 mg/dL (ref 6–20)
CHLORIDE: 112 mmol/L — AB (ref 101–111)
CO2: 23 mmol/L (ref 22–32)
CREATININE: 1.37 mg/dL — AB (ref 0.61–1.24)
Calcium: 8.3 mg/dL — ABNORMAL LOW (ref 8.9–10.3)
GFR calc non Af Amer: 46 mL/min — ABNORMAL LOW (ref >60)
GFR, EST AFRICAN AMERICAN: 54 mL/min — AB (ref >60)
Glucose, Bld: 161 mg/dL — ABNORMAL HIGH (ref 65–99)
Potassium: 3.7 mmol/L (ref 3.5–5.1)
Sodium: 141 mmol/L (ref 135–145)

## 2015-02-22 LAB — MAGNESIUM: Magnesium: 1.8 mg/dL (ref 1.7–2.4)

## 2015-02-22 MED ORDER — OXYCODONE-ACETAMINOPHEN 5-325 MG PO TABS: 1.0000 | ORAL_TABLET | ORAL | Status: DC | PRN

## 2015-02-22 MED ORDER — ONDANSETRON HCL 4 MG PO TABS: 4.0000 mg | ORAL_TABLET | Freq: Four times a day (QID) | ORAL | Status: DC | PRN

## 2015-02-22 NOTE — Evaluation (Signed)
Occupational Therapy Evaluation Patient Details Name: Craig Waters. MRN: [id number] DOB: 04-23-1931 Today's Date: 02/22/2015    History of Present Illness Pt admitted through ED with acute renal failure and hs of vertigo, RW and bladder CA   Clinical Impression   Pt up in room with walker and also walked out into hallway but with limited activity tolerance. He states he felt a little lightheaded also throughout activity. Pt may d/c home today --recommended to pt to have 24/7 initially for safety and discussed some energy conservation strategies to help with performing ADL. Recommend HHOT to reinforce and education further on these strategies. Will follow if here after today to continue to progress ADL independence. O2 on RA 90-94% with activity.     Follow Up Recommendations  Home health OT;Supervision/Assistance - 24 hour    Equipment Recommendations  None recommended by OT    Recommendations for Other Services       Precautions / Restrictions Precautions Precautions: Fall Precaution Comments: Nephrostomy drain on L      Mobility Bed Mobility Overal bed mobility: Needs Assistance Bed Mobility: Supine to Sit     Supine to sit: Min guard     General bed mobility comments: uses momentum to held him over to EOB. some effort. used rail.   Transfers Overall transfer level: Needs assistance Equipment used: Rolling walker (2 wheeled) Transfers: Sit to/from Stand Sit to Stand: Min guard         General transfer comment: cues for hand placement. min guard for safety.     Balance Overall balance assessment: Needs assistance   Sitting balance-Leahy Scale: Fair       Standing balance-Leahy Scale: Fair                              ADL Overall ADL's : Needs assistance/impaired Eating/Feeding: Independent;Sitting   Grooming: Wash/dry hands;Set up;Sitting   Upper Body Bathing: Set up;Sitting   Lower Body Bathing: Min guard;Sit to/from stand    Upper Body Dressing : Set up;Sitting   Lower Body Dressing: Min guard;Sit to/from stand   Toilet Transfer: Min guard;Ambulation;RW   Toileting- Water quality scientist and Hygiene: Min guard;Sit to/from stand         General ADL Comments: Pt up with OT and performed functional mobility into the hallway to the next room down with the walker and then needed to come back to the room to sit back down. He performed LB dressing at EOB and was able to don own socks but with increased effort and some fatigue. Pt reports some lightheadedness with sitting up at EOB and up in room--he states his LEs feel weak overall and he was not using a walker at home PTA. Feel he could benefit from [facility] for energy conservation and safety. He states his daughter can be with him at home 24/7. Discussed use of 3in1 close by if he doesnt feel he can make it into the bathroom initially and also not showering initially but sponge bathing for energy conservation. Pt states he is ok with doing a sponge bath for awhile. Advised pt to make sure he uses the walker whenever up right now. pt may d/c today.      Vision     Perception     Praxis      Pertinent Vitals/Pain Pain Assessment: No/denies pain     Hand Dominance     Extremity/Trunk Assessment Upper Extremity Assessment Upper Extremity Assessment:  Generalized weakness           Communication Communication Communication: No difficulties   Cognition Arousal/Alertness: Awake/alert Behavior During Therapy: WFL for tasks assessed/performed Overall Cognitive Status: Within Functional Limits for tasks assessed                     General Comments       Exercises       Shoulder Instructions      Home Living Family/patient expects to be discharged to:: Private residence Living Arrangements: Children Available Help at Discharge: Family Type of Home: House Home Access: Level entry     [place]: One level     Bathroom Shower/Tub:  Teacher, early years/pre: [place]: Environmental consultant - 2 wheels;Cane - single point;Bedside commode;Shower seat;Hospital bed          Prior Functioning/Environment Level of Independence: Independent             OT Diagnosis: Generalized weakness   OT Problem List: Decreased strength;Decreased knowledge of use of DME or AE;Decreased activity tolerance   OT Treatment/Interventions: Self-care/ADL training;Patient/family education;Therapeutic activities;DME and/or AE instruction    OT Goals(Current goals can be found in the care plan section) Acute Rehab OT Goals Patient Stated Goal: home and get stronger OT Goal Formulation: With patient Time For Goal Achievement: 03/08/15 Potential to Achieve Goals: Good  OT Frequency: Min 2X/week   Barriers to D/C:            Co-evaluation              End of Session Equipment Utilized During Treatment: Rolling walker  Activity Tolerance: Patient limited by fatigue Patient left: in chair;with call bell/phone within reach   Time: 1237-1310 OT Time Calculation (min): 33 min Charges:  OT General Charges $OT Visit: 1 Procedure OT Evaluation $Initial OT Evaluation Tier I: 1 Procedure OT Treatments $Therapeutic Activity: 8-22 mins G-Codes:    Jules Schick  [phone] 02/22/2015, 1:32 PM

## 2015-02-22 NOTE — Discharge Summary (Addendum)
Physician Discharge Summary  Craig Waters. XIP:[id number] DOB: 1930/05/21 DOA: 02/15/2015  PCP: [name], MD  Admit date: 02/15/2015 Discharge date: 02/22/2015  Time spent: 20 minutes  Recommendations for Outpatient Follow-up:  1. Follow up with PCP in 1-2 weeks 2. Follow up with Dr. [name] at [facility]. Patient to be contacted to re-schedule recently missed appointment  Discharge Diagnoses:  Principal Problem:   Acute renal failure (ARF) ([place]) Active Problems:   Encephalopathy acute   HTN (hypertension)   Urothelial carcinoma (HCC)   Rheumatoid arthritis ([place])   Hyperkalemia   Metabolic acidosis   Obstructive uropathy   Bilateral hydronephrosis   Left nephrolithiasis   Acute renal failure ([place])   Atrial fibrillation, rapid ([place])   Hydronephrosis, right   Discharge Condition: Improved  Diet recommendation: Heart healthy  Filed Weights   02/18/15 0846 02/21/15 0400 02/22/15 0432  Weight: 99.8 kg (220 lb 0.3 oz) 96.3 kg (212 lb 4.9 oz) 96.7 kg (213 lb 3 oz)    History of present illness:  Please review dictated H and P from 10/24 for details. Briefly, Craig Waters. is an 81 y.o. male with a PMH of rheumatoid arthritis on chronic prednisone and other immunosuppressive therapy, high-grade urothelial cell carcinoma, under the care of Dr. [name] at [facility], who had originally scheduled him for a palliative surgical procedure for 02/17/15 but who presented with generalized weakness, failure to thrive, nausea, loss of appetite, and an unsteady gait. Pt was found to have Cr of 12 and K of 7.5 and was thus admitted for management of ARF  Hospital Course:  1. AKI -suspect progressive since March'16, due to progressive obstruction and poor PO intake -Bilateral hydronephrosis was noted on CT -Renal was consulted and pt underwent HD cath and Perc Tubes placement on 10/25 in anticipation for HD -However, with improving renal function and increased urine  output, pt has avoided transfer to [facility] for dialysis  -Patient's renal function further improved following nephrostomy tube placement with Cr continuing to head towards normal  2. Persistent Hyperkalemia/Hypokalemia -due to #1 -no peak T waves or arrhthymias since admission -replaced  3. Bladder CA -currently under the care of Dr. [name] at [facility] who was notified of admission per EDP on admit -Urology was consulted. Pt is s/p resection 07/07/14 and had a follow-up cystoscopy 11/16/14 showing persistent disease, was scheduled for cystoscopy, TURBT/ vaporization with possible intravesical epirubicin -Pt now s/p L nephrostomy placement. Unfortunately, a R nephrostomy tube could not be placed secondary to agitation -Urology now with recs to hold off on stone extraction and to f/u at [facility] for re-resection and to leave WITH nephrostomy tube in place on d/c  4. Uremic encephalopathy -Seems resolved and pt at baseline  5. H/o RA on steroids -being tapered, currently on 10mg  daily, continue same  6. DVT proph: SCD's  7. Afib with RVR - CHADS-VASc of 3 - During this course, pt noted to have afib with RVR and required cardizem gtt, since transitioned back to home dosed PO cardizem - Given thrombocytopenia, hematuria, and pending urologic surgery, will holding off on anticoagulation at this time and defer anticoagulation to PCP - Lytes normal. 2d echo unremarkable - TSH on 05/15/14 w/in normal limits  8. Thrombocytopenia - Prophylactic heparin was stopped per above  9. Ileus, Bowel Obstruction - During this course, patient's abdomen more distended with abd Xray demonstrating gaseous distension suspicious for ileus vs bowel obstruction - Stool was noted distally on imaging - Large reported  BM with soap suds enema  - General Surgery was consulted and following bowel rest, patient began passing flatus and stool. Ileus resolved and patient's diet was successfully  advanced.  10. Hypomagnesemia - Replaced  Procedures: 2. HD cath placement 10/25 3. L Nephrostomy placement 10/25 (R side not done secondary to increased agitation)  Consultations: 4. Nephrology 5. Urology 6. IR 7. General Surgery  Discharge Exam: Filed Vitals:   02/22/15 1000 02/22/15 1100 02/22/15 1200 02/22/15 1245  BP: 159/93 144/75  169/69  Pulse:      Temp:   99.2 F (37.3 C)   TempSrc:   Oral   Resp: 16 17    Height:      Weight:      SpO2: 88% 93%  90%    General: Awake, in nad Cardiovascular: regular, s1, s2 Respiratory: normal resp effort, no wheezing  Discharge Instructions     Medication List    STOP taking these medications        acetaminophen-codeine 300-30 MG tablet  Commonly known as:  TYLENOL #3     hydroxychloroquine 200 MG tablet  Commonly known as:  PLAQUENIL      TAKE these medications        acetaminophen 650 MG CR tablet  Commonly known as:  TYLENOL  Take 1,300 mg by mouth every 6 (six) hours as needed for pain.     b complex vitamins tablet  Take 1 tablet by mouth daily.     CALCIUM + D PO  Take 1 tablet by mouth daily.     diltiazem 180 MG 24 hr capsule  Commonly known as:  TIAZAC  Take 180 mg by mouth daily.     hyoscyamine 0.125 MG SL tablet  Commonly known as:  LEVSIN SL  Take 0.125 mg by mouth every 4 (four) hours as needed (bladder spasms).     multivitamin with minerals Tabs tablet  Take 1 tablet by mouth daily.     ondansetron 4 MG tablet  Commonly known as:  ZOFRAN  Take 1 tablet (4 mg total) by mouth every 6 (six) hours as needed for nausea.     oxyCODONE-acetaminophen 5-325 MG tablet  Commonly known as:  PERCOCET/ROXICET  Take 1 tablet by mouth every 4 (four) hours as needed for moderate pain.     polyethylene glycol powder powder  Commonly known as:  GLYCOLAX/MIRALAX  Take 255 g by mouth daily.     predniSONE 5 MG tablet  Commonly known as:  DELTASONE  Take 5-12.5 mg by mouth daily with  breakfast. 10-18 to 10-31 12.5 mg, 11-1 to  11-14 10 mg, 11-15 to 11-29 7.5 mg, 11-30 to 12-13 5 mg then back to the MD for further directions.     traMADol 50 MG tablet  Commonly known as:  ULTRAM  Take 50 mg by mouth every 6 (six) hours as needed for moderate pain.       Allergies  Allergen Reactions  . Other Other (See Comments)    Pain med-causes hallucinations (unknown drug name)   Follow-up Information    Follow up with [name], MD. Schedule an appointment as soon as possible for a visit in 1 week.   Specialty:  Internal Medicine   Why:  Hospital follow up   Contact information:   [address] [place] [address] [phone]       Schedule an appointment as soon as possible for a visit with Caren Macadam, MD.  Specialty:  Urology   Why:  you will be contacted to schedule an appointment   Contact information:   [place] [address] [phone]        The results of significant diagnostics from this hospitalization (including imaging, microbiology, ancillary and laboratory) are listed below for reference.    Significant Diagnostic Studies: Ct Abdomen Pelvis Wo Contrast  02/15/2015  CLINICAL DATA:  81 year old male with abdominal pain and bloating since this morning. Recent dizziness. Acute renal failure. Current history of bladder cancer, gross hematuria. EXAM: CT ABDOMEN AND PELVIS WITHOUT CONTRAST TECHNIQUE: Multidetector CT imaging of the abdomen and pelvis was performed following the standard protocol without IV contrast. COMPARISON:  Renal ultrasound 02/12/2015. Lumbar MRI 07/29/2014. CT Abdomen and Pelvis 08/23/2011. FINDINGS: Chronic elevation of the right hemidiaphragm. Right middle lobe and lower lobe atelectasis with some air bronchograms. Superimposed bibasilar dependent atelectasis. Small volume pleural effusions in both costophrenic angles. No pericardial effusion. Chronic degenerative changes in  the spine. Chronic lumbar pars defects associated with L3-L4 anterolisthesis. No acute or suspicious osseous lesion identified. Abnormal bladder wall thickening with Foley catheter in place. Prostate surgery suspected since the 2013 comparison. No pelvic free fluid. Negative rectum. Pelvic sidewall lymphadenopathy greater on the left, up to 10 mm short axis. No inguinal lymphadenopathy. Redundant sigmoid colon with mild diverticulosis. Occasional diverticula in the left colon. Mild diverticula at the hepatic flexure. Negative right colon. Appendix not identified. There is trace right lower quadrant free fluid associated with left greater than right retroperitoneal and lateral Conal fascia stranding. See renal findings below. Most of the small bowel is decompressed. There are some gas-filled nondilated loops in the ventral abdomen. The stomach is distended with oral contrast. Negative duodenum. Noncontrast gallbladder, spleen, pancreas, and adrenal glands are within normal limits. There is a hypodense posterior right lobe liver mass approximating 5.4 cm diameter (series 2, image 32) Which is subtle in the absence of IV contrast. No other liver lesion identified. This is new since 2013. There is a 5 x 10 mm distal left ureteral stone located 10-15 mm from the left ureterovesical junction. There is associated left hydroureter and left greater than right periureteral stranding with mild left hydronephrosis. Likewise there is mild right hydronephrosis and hydroureter, however, no right ureteral calculus is identified. No nephrolithiasis. Bilateral hypodense renal cysts have increased have mildly increased since 2013. No pneumoperitoneum. No upper abdominal free fluid. Aortoiliac calcified atherosclerosis noted. Ectatic infrarenal abdominal aorta measuring up to 27 mm diameter is stable since 2013. IMPRESSION: 1. 5 cm hypodense right lobe liver mass is new since 2013 and highly suspicious for metastatic disease in this  setting. 2. Bladder wall thickening. Bilateral hydronephrosis and hydroureter suggesting obstructive uropathy. On the left there is a distal ureteral calculus measuring 5 x 10 mm located about 1 cm from the left UVJ. No right side urologic calculus, consider obstruction due to bladder tumor. 3. Left greater than right pelvic sidewall lymphadenopathy, up to 10 mm short axis. 4. Trace pleural effusions. Right greater than left lung base atelectasis. Electronically Signed   By: Genevie Ann M.D.   On: 02/15/2015 16:51   Dg Chest 2 View  02/15/2015  CLINICAL DATA:  Increased weakness over the past few days. Worsening cough. Chest congestion. EXAM: CHEST  2 VIEW COMPARISON:  02/12/2015. FINDINGS: Poor inspiration. Enlarged cardiac silhouette with a mild increase in size. Chronically elevated right hemidiaphragm. Clear lungs. Small bilateral pleural effusions. Upper lumbar spine degenerative changes. IMPRESSION: 1. Small  bilateral pleural effusions. 2. Mildly progressive cardiomegaly. 3. Stable chronically elevated right hemidiaphragm. Electronically Signed   By: Claudie Revering M.D.   On: 02/15/2015 10:48   US Renal  02/12/2015  CLINICAL DATA:  Bladder cancer.  Hematuria. EXAM: RENAL / URINARY TRACT ULTRASOUND COMPLETE COMPARISON:  08/23/2011 FINDINGS: Right Kidney: Length: 14.3 cm. Multiple simple cysts. Largest is in the interpolar region measuring 5.7 cm. No hydronephrosis. Left Kidney: Length: 12.7 cm. Central hypoechoic area in the upper pole of the left kidney measures 1.9 x 1.7 x 1.5 cm. Additional hypoechoic exophytic lesion in the interpolar region measures 1.6 x 1.4 x 1.8 cm. These are both indeterminate. No hydronephrosis. Bladder: Appears normal for degree of bladder distention. IMPRESSION: Simple cysts in the right kidney are stable. No hydronephrosis. There are 2 new lesions in the left kidney which are indeterminate. MRI with and without contrast can be performed to further delineate. Electronically Signed    By: Marybelle Killings M.D.   On: 02/12/2015 14:24   Ir Fluoro Guide Cv Line Right  02/16/2015  CLINICAL DATA:  80 year old male admitted with acute renal failure. He has been referred for evaluation for hemodialysis catheter placement. Catheter placement is indicated. EXAM: IR RIGHT FLOURO GUIDE CV LINE; IR ULTRASOUND GUIDANCE VASC ACCESS RIGHT Date: 02/16/2015 ANESTHESIA/SEDATION: Moderate (conscious) sedation was administered during this procedure. A total of 0.5 mg Versed and 25 mg Fentanyl were administered intravenously. The patient's vital signs were monitored continuously by radiology nursing throughout the course of the procedure. Total sedation time: 11 minutes FLUOROSCOPY TIME:  18 seconds TECHNIQUE: The procedure, risks, benefits, and alternatives were explained to the patient. Questions regarding the procedure were encouraged and answered. The patient understands and consents to the procedure. The right neck and chest was prepped with chlorhexidine, and draped in the usual sterile fashion using maximum barrier technique (cap and mask, sterile gown, sterile gloves, large sterile sheet, hand hygiene and cutaneous antiseptic). Local anesthesia was attained by infiltration with 1% lidocaine with epinephrine. Ultrasound demonstrated patency of the right internal jugular vein, and this was documented with an image. Under real-time ultrasound guidance, this vein was accessed with a 21 gauge micropuncture needle and image documentation was performed. A small dermatotomy was made at the access site with an 11 scalpel. A 0.018" wire was advanced into the SVC and the access needle exchanged for a 11F micropuncture vascular sheath. The 0.018" wire was then removed and a 0.035" wire advanced into the IVC. Dilation of the tissue tract was performed with a 10 French tissue dilator. A 20 cm triple-lumen trialysis catheter was then placed using Seldinger technique, with the tip terminating at the superior cavoatrial  junction. Wire was removed.  The catheter was sutured in position. Patient tolerated the procedure well and remained hemodynamically stable throughout. No complications were encountered and no significant blood loss was encountered. COMPLICATIONS: None IMPRESSION: Status post placement of right IJ temporary hemodialysis catheter. Catheter ready for use. Signed, Dulcy Fanny. Earleen Newport, DO [facility] Electronically Signed   By: Corrie Mckusick D.O.   On: 02/16/2015 13:16   Ir US Guide Vasc Access Right  02/16/2015  CLINICAL DATA:  81 year old male admitted with acute renal failure. He has been referred for evaluation for hemodialysis catheter placement. Catheter placement is indicated. EXAM: IR RIGHT FLOURO GUIDE CV LINE; IR ULTRASOUND GUIDANCE VASC ACCESS RIGHT Date: 02/16/2015 ANESTHESIA/SEDATION: Moderate (conscious) sedation was administered during this procedure. A total of 0.5 mg Versed and 25  mg Fentanyl were administered intravenously. The patient's vital signs were monitored continuously by radiology nursing throughout the course of the procedure. Total sedation time: 11 minutes FLUOROSCOPY TIME:  18 seconds TECHNIQUE: The procedure, risks, benefits, and alternatives were explained to the patient. Questions regarding the procedure were encouraged and answered. The patient understands and consents to the procedure. The right neck and chest was prepped with chlorhexidine, and draped in the usual sterile fashion using maximum barrier technique (cap and mask, sterile gown, sterile gloves, large sterile sheet, hand hygiene and cutaneous antiseptic). Local anesthesia was attained by infiltration with 1% lidocaine with epinephrine. Ultrasound demonstrated patency of the right internal jugular vein, and this was documented with an image. Under real-time ultrasound guidance, this vein was accessed with a 21 gauge micropuncture needle and image documentation was  performed. A small dermatotomy was made at the access site with an 11 scalpel. A 0.018" wire was advanced into the SVC and the access needle exchanged for a 47F micropuncture vascular sheath. The 0.018" wire was then removed and a 0.035" wire advanced into the IVC. Dilation of the tissue tract was performed with a 10 French tissue dilator. A 20 cm triple-lumen trialysis catheter was then placed using Seldinger technique, with the tip terminating at the superior cavoatrial junction. Wire was removed.  The catheter was sutured in position. Patient tolerated the procedure well and remained hemodynamically stable throughout. No complications were encountered and no significant blood loss was encountered. COMPLICATIONS: None IMPRESSION: Status post placement of right IJ temporary hemodialysis catheter. Catheter ready for use. Signed, Dulcy Fanny. Earleen Newport, DO [facility] Electronically Signed   By: Corrie Mckusick D.O.   On: 02/16/2015 13:16   Dg Abd 2 Views  02/20/2015  CLINICAL DATA:  Abdominal pain, follow-up ileus, status post nephrectomy EXAM: ABDOMEN - 2 VIEW COMPARISON:  02/19/2015 FINDINGS: Mild gaseous distention of small and large bowel, compatible with history of postoperative adynamic ileus. No evidence of bowel obstruction. Left percutaneous nephrostomy. Degenerative changes the lumbar spine. IMPRESSION: No evidence of bowel obstruction. Mild gaseous distention of small and large bowel, compatible with history of postoperative adynamic ileus. Electronically Signed   By: Julian Hy M.D.   On: 02/20/2015 12:28   Dg Abd Portable 1v  02/19/2015  CLINICAL DATA:  Abdominal pain, nausea, constipation EXAM: PORTABLE ABDOMEN - 1 VIEW COMPARISON:  None. FINDINGS: Left nephrostomy catheter is noted. Gaseous distended small bowel loops on mid abdomen highly suspicious for significant ileus or partial small bowel obstruction. Stool and gas noted in distal  colon. IMPRESSION: Gaseous distended small bowel loops mid abdomen highly suspicious for significant ileus or bowel obstruction. Electronically Signed   By: Lahoma Crocker M.D.   On: 02/19/2015 14:10   Ir Nephrostomy Placement Left  02/16/2015  CLINICAL DATA:  81 year old male admitted with acute renal failure. Patient never has had dialysis. He has evidence on imaging of mild pelvicaliectasis with no frank hydronephrosis. CT demonstrates a distal left ureteral stone and minimal ureteral dilation. He has been referred for evaluation for urinary diversion, as a component of acute renal failure may be related to an obstruction. Drain catheter placement is indicated. EXAM: IR NEPHROSTOMY PLACEMENT LEFT COMPARISON:  None. ANESTHESIA/SEDATION: 2.0 IV Versed; 25 mcg IV Fentanyl. Total Moderate Sedation Time: 72 minutes. CONTRAST:  21mL OMNIPAQUE IOHEXOL 300 MG/ML  SOLN MEDICATIONS: 400 mg IV Cipro FLUOROSCOPY TIME:  12 minutes, 36 seconds TECHNIQUE: The procedure, risks, benefits, and alternatives were explained to the  patient and the patient's family. Questions regarding the procedure were encouraged and answered. The patient understands and consents to the procedure. The bilateral flanks were prepped with chlorhexidine in a sterile fashion, and a sterile drape was applied covering the operative field. A sterile gown and sterile gloves were used for the procedure. Local anesthesia was provided with 1% Lidocaine. Ultrasound survey of both the left and right kidney was performed with images stored and sent to PACs. Left: Left kidney was then identified, without significant hydronephrosis. Ultrasound guidance was used to direct a 22 gauge coaxial needle into the interpolar region of the kidney. Once the tip of the needle was withdrawn into the collecting system, this access was used to opacify the renal collecting system with contrast. A double stick technique was then used after opacifying the collecting system to select a  lower pole posterior calyx for a second stick. A 22 gauge coaxial needle was then directed under fluoroscopy into this lower pole posterior calyx. A Nitrex wire was navigated through the infundibulum and collecting system into the ureter, and then using modified Seldinger technique, an Accustick system, and a 10 [place] dilator, a 10 French pigtail drainage catheter was placed into the renal pelvis. Near the conclusion of successful placement of the left-sided percutaneous nephrostomy tube, the patient was becoming restless and agitated. Further attempt at a right-sided percutaneous nephrostomy tube was deemed unsafe. Patient remained hemodynamically stable throughout. No complications encountered. COMPLICATIONS: None FINDINGS: Ultrasound survey of the left kidney demonstrates minimal pelvicaliectasis. Ultrasound survey of the right kidney demonstrates no hydronephrosis, and no significant pelvicaliectasis. Images during the case demonstrate placement of left-sided percutaneous nephrostomy tube into a posterior inferior calyx. No significant hydronephrosis with infusion of contrast. IMPRESSION: Status post placement of left-sided percutaneous nephrostomy tube. Signed, Dulcy Fanny. Earleen Newport, DO [facility] PLAN: Given that the patient's agitation and discomfort precluded safe attempt at placement of a right-sided percutaneous nephrostomy tube, further attempts may be reconsidered after interval medical therapy. Observation of urine clearance from the left percutaneous nephrostomy tube. Electronically Signed   By: Corrie Mckusick D.O.   On: 02/16/2015 13:29    Microbiology: Recent Results (from the past 240 hour(s))  MRSA PCR Screening     Status: None   Collection Time: 02/15/15  7:54 PM  Result Value Ref Range Status   MRSA by PCR NEGATIVE NEGATIVE Final    Comment:        The GeneXpert MRSA Assay (FDA approved for NASAL specimens only), is one component  of a comprehensive MRSA colonization surveillance program. It is not intended to diagnose MRSA infection nor to guide or monitor treatment for MRSA infections.      Labs: Basic Metabolic Panel:  Recent Labs Lab 02/16/15 1756 02/17/15 0225 02/17/15 0645 02/18/15 0350 02/19/15 0408 02/19/15 1700 02/20/15 0355 02/21/15 0338 02/22/15 0337  NA 140 141 145 143 144 139 140 141 141  K 6.1* 4.5 4.2 3.3* 3.5 3.5 3.0* 3.3* 3.7  CL 102 104 105 105 111 109 109 109 112*  CO2 24 28 30 31 27 26 25 24 23   GLUCOSE 136* 180* 185* 169* 169* 195* 173* 168* 161*  BUN 100* 81* 76* 49* 29* 24* 18 13 15   CREATININE 10.12* 7.24* 6.36* 3.24* 1.85* 1.71* 1.45* 1.37* 1.37*  CALCIUM 8.7* 8.1* 8.2* 7.5* 7.8* 7.9* 7.9* 8.1* 8.3*  MG  --   --  2.2  --   --   --  1.7  --  1.8  PHOS 5.9* 5.5* 5.6* 4.7*  --   --   --   --   --    Liver Function Tests:  Recent Labs Lab 02/16/15 1756 02/17/15 0225 02/17/15 0645 02/18/15 0350  ALBUMIN 2.7* 2.6* 2.4* 2.2*   No results for input(s): LIPASE, AMYLASE in the last 168 hours. No results for input(s): AMMONIA in the last 168 hours. CBC:  Recent Labs Lab 02/16/15 0735 02/17/15 0225 02/18/15 0350 02/19/15 0408 02/20/15 0355  WBC 16.3* 9.8 9.8 9.8 10.5  HGB 14.2 12.3* 10.4* 10.6* 11.4*  HCT 42.7 38.0* 33.5* 33.6* 35.4*  MCV 91.6 93.4 94.6 94.4 93.4  PLT 128* 117* 94* 104* 130*   Cardiac Enzymes: No results for input(s): CKTOTAL, CKMB, CKMBINDEX, TROPONINI in the last 168 hours. BNP: BNP (last 3 results)  Recent Labs  02/15/15 1206  BNP 522.9*    ProBNP (last 3 results) No results for input(s): PROBNP in the last 8760 hours.  CBG: No results for input(s): GLUCAP in the last 168 hours.   Signed:  CHIU, Orpah Melter  [facility] 02/22/2015, 1:56 PM

## 2015-02-22 NOTE — Progress Notes (Signed)
Patient ID: Craig Waters., male   DOB: March 09, 1931, 81 y.o.   MRN: [id number]     [employer]      [address] [place], [place] [address]    Phone: [phone] FAX: [phone]     Subjective: Several BMs.  No n/v.  Tolerating PO's.  Mild abd pain.  Has bladder ca, aki, hydronephrosis.  K normal.   Objective:  Vital signs:  Filed Vitals:   02/22/15 0432 02/22/15 0500 02/22/15 0600 02/22/15 0700  BP:  147/71 146/72 139/65  Pulse:      Temp: 99.7 F (37.6 C)     TempSrc: Oral     Resp:  17 16 18   Height:      Weight: 96.7 kg (213 lb 3 oz)     SpO2:  92% 94% 93%    Last BM Date: 02/21/15  Intake/Output   Yesterday:  10/30 0701 - 10/31 0700 In: 2575 [P.O.:320; I.V.:1855; IV Piggyback:400] Out: 3020 [Urine:3020] This shift: I/O last 3 completed shifts: In: 3600 [P.O.:320; I.V.:2880; IV Piggyback:400] Out: 5055 [Urine:5055]    Physical Exam: General: Pt awake/alert/oriented x4 in no acute distress Abdomen: Soft.  Nondistended.  Non tender.  No evidence of peritonitis.  No incarcerated hernias.   Problem List:   Principal Problem:   Acute renal failure (ARF) (HCC) Active Problems:   Encephalopathy acute   HTN (hypertension)   Urothelial carcinoma (HCC)   Rheumatoid arthritis (HCC)   Hyperkalemia   Metabolic acidosis   Obstructive uropathy   Bilateral hydronephrosis   Left nephrolithiasis   Acute renal failure (HCC)   Atrial fibrillation, rapid ([place])   Hydronephrosis, right    Results:   Labs: Results for orders placed or performed during the hospital encounter of 02/15/15 (from the past 48 hour(s))  Basic metabolic panel     Status: Abnormal   Collection Time: 02/21/15  3:38 AM  Result Value Ref Range   Sodium 141 135 - 145 mmol/L   Potassium 3.3 (L) 3.5 - 5.1 mmol/L   Chloride 109 101 - 111 mmol/L   CO2 24 22 - 32 mmol/L   Glucose, Bld 168 (H) 65 - 99 mg/dL   BUN 13 6 - 20 mg/dL   Creatinine, Ser 1.37 (H) 0.61 - 1.24 mg/dL   Calcium 8.1 (L) 8.9 - 10.3 mg/dL   GFR calc non Af Amer 46 (L) >60 mL/min   GFR calc Af Amer 54 (L) >60 mL/min    Comment: (NOTE) The eGFR has been calculated using the CKD EPI equation. This calculation has not been validated in all clinical situations. eGFR's persistently <60 mL/min signify possible Chronic Kidney Disease.    Anion gap 8 5 - 15  Basic metabolic panel     Status: Abnormal   Collection Time: 02/22/15  3:37 AM  Result Value Ref Range   Sodium 141 135 - 145 mmol/L   Potassium 3.7 3.5 - 5.1 mmol/L   Chloride 112 (H) 101 - 111 mmol/L   CO2 23 22 - 32 mmol/L   Glucose, Bld 161 (H) 65 - 99 mg/dL   BUN 15 6 - 20 mg/dL   Creatinine, Ser 1.37 (H) 0.61 - 1.24 mg/dL   Calcium 8.3 (L) 8.9 - 10.3 mg/dL   GFR calc non Af Amer 46 (L) >60 mL/min   GFR calc Af Amer 54 (L) >60 mL/min    Comment: (NOTE) The eGFR has been calculated using the CKD EPI equation.  This calculation has not been validated in all clinical situations. eGFR's persistently <60 mL/min signify possible Chronic Kidney Disease.    Anion gap 6 5 - 15  Magnesium     Status: None   Collection Time: 02/22/15  3:37 AM  Result Value Ref Range   Magnesium 1.8 1.7 - 2.4 mg/dL    Imaging / Studies: Dg Abd 2 Views  02/20/2015  CLINICAL DATA:  Abdominal pain, follow-up ileus, status post nephrectomy EXAM: ABDOMEN - 2 VIEW COMPARISON:  02/19/2015 FINDINGS: Mild gaseous distention of small and large bowel, compatible with history of postoperative adynamic ileus. No evidence of bowel obstruction. Left percutaneous nephrostomy. Degenerative changes the lumbar spine. IMPRESSION: No evidence of bowel obstruction. Mild gaseous distention of small and large bowel, compatible with history of postoperative adynamic ileus. Electronically Signed   By: Julian Hy M.D.   On: 02/20/2015 12:28    Medications / Allergies:  Scheduled Meds: . antiseptic oral rinse  7 mL Mouth Rinse  [id number]  . chlorhexidine  15 mL Mouth Rinse BID  . diltiazem  180 mg Oral Daily  . potassium chloride  40 mEq Oral BID  . predniSONE  10 mg Oral Q breakfast  . sodium chloride  3 mL Intravenous Q12H  . sodium chloride  3 mL Intravenous Q12H   Continuous Infusions: . dextrose 5 % and 0.45% NaCl 75 mL/hr at 02/21/15 0618   PRN Meds:.sodium chloride, hydrALAZINE, iohexol, labetalol, morphine injection, ondansetron **OR** ondansetron (ZOFRAN) IV, oxyCODONE-acetaminophen, polyethylene glycol, simethicone, sodium chloride, zolpidem  Antibiotics: Anti-infectives    Start     Dose/Rate Route Frequency Ordered Stop   02/16/15 1030  ciprofloxacin (CIPRO) IVPB 400 mg     400 mg 200 mL/hr over 60 Minutes Intravenous  Once 02/16/15 0931 02/16/15 1052   02/16/15 0932  ciprofloxacin (CIPRO) 400 MG/200ML IVPB    Comments:  Margaretmary Dys   : cabinet override      02/16/15 0932 02/16/15 0950        Assessment/Plan Ileus-resolved.  Tolerating POs and having BMs.  Surgery signing off.  Please call for further assistance.    Erby Pian, [facility] Surgery Pager [phone])   02/22/2015 7:55 AM

## 2015-03-14 ENCOUNTER — Emergency Department (HOSPITAL_COMMUNITY): Payer: Medicare Other

## 2015-03-14 ENCOUNTER — Inpatient Hospital Stay (HOSPITAL_COMMUNITY): Payer: Medicare Other

## 2015-03-14 ENCOUNTER — Encounter (HOSPITAL_COMMUNITY): Payer: Self-pay | Admitting: Emergency Medicine

## 2015-03-14 ENCOUNTER — Inpatient Hospital Stay (HOSPITAL_COMMUNITY)
Admission: EM | Admit: 2015-03-14 | Discharge: 2015-03-20 | DRG: 253 | Disposition: A | Discharge status: Home Health | Payer: Medicare Other | Attending: Internal Medicine | Admitting: Internal Medicine

## 2015-03-14 DIAGNOSIS — Z905 Acquired absence of kidney: Secondary | ICD-10-CM | POA: Diagnosis not present

## 2015-03-14 DIAGNOSIS — N390 Urinary tract infection, site not specified: Secondary | ICD-10-CM | POA: Diagnosis present

## 2015-03-14 DIAGNOSIS — E860 Dehydration: Secondary | ICD-10-CM | POA: Diagnosis present

## 2015-03-14 DIAGNOSIS — I480 Paroxysmal atrial fibrillation: Principal | ICD-10-CM | POA: Diagnosis present

## 2015-03-14 DIAGNOSIS — R739 Hyperglycemia, unspecified: Secondary | ICD-10-CM | POA: Diagnosis present

## 2015-03-14 DIAGNOSIS — I248 Other forms of acute ischemic heart disease: Secondary | ICD-10-CM | POA: Diagnosis present

## 2015-03-14 DIAGNOSIS — I129 Hypertensive chronic kidney disease with stage 1 through stage 4 chronic kidney disease, or unspecified chronic kidney disease: Secondary | ICD-10-CM | POA: Diagnosis present

## 2015-03-14 DIAGNOSIS — R0902 Hypoxemia: Secondary | ICD-10-CM | POA: Diagnosis present

## 2015-03-14 DIAGNOSIS — R0600 Dyspnea, unspecified: Secondary | ICD-10-CM

## 2015-03-14 DIAGNOSIS — I82403 Acute embolism and thrombosis of unspecified deep veins of lower extremity, bilateral: Secondary | ICD-10-CM | POA: Diagnosis present

## 2015-03-14 DIAGNOSIS — N179 Acute kidney failure, unspecified: Secondary | ICD-10-CM | POA: Diagnosis present

## 2015-03-14 DIAGNOSIS — I82401 Acute embolism and thrombosis of unspecified deep veins of right lower extremity: Secondary | ICD-10-CM

## 2015-03-14 DIAGNOSIS — I10 Essential (primary) hypertension: Secondary | ICD-10-CM | POA: Diagnosis not present

## 2015-03-14 DIAGNOSIS — I4891 Unspecified atrial fibrillation: Secondary | ICD-10-CM | POA: Diagnosis present

## 2015-03-14 DIAGNOSIS — Z87891 Personal history of nicotine dependence: Secondary | ICD-10-CM

## 2015-03-14 DIAGNOSIS — M7989 Other specified soft tissue disorders: Secondary | ICD-10-CM

## 2015-03-14 DIAGNOSIS — N400 Enlarged prostate without lower urinary tract symptoms: Secondary | ICD-10-CM | POA: Diagnosis present

## 2015-03-14 DIAGNOSIS — I4892 Unspecified atrial flutter: Secondary | ICD-10-CM | POA: Diagnosis present

## 2015-03-14 DIAGNOSIS — K219 Gastro-esophageal reflux disease without esophagitis: Secondary | ICD-10-CM | POA: Diagnosis present

## 2015-03-14 DIAGNOSIS — Z8711 Personal history of peptic ulcer disease: Secondary | ICD-10-CM | POA: Diagnosis not present

## 2015-03-14 DIAGNOSIS — R319 Hematuria, unspecified: Secondary | ICD-10-CM | POA: Diagnosis not present

## 2015-03-14 DIAGNOSIS — M069 Rheumatoid arthritis, unspecified: Secondary | ICD-10-CM | POA: Diagnosis present

## 2015-03-14 DIAGNOSIS — K5900 Constipation, unspecified: Secondary | ICD-10-CM | POA: Diagnosis present

## 2015-03-14 DIAGNOSIS — I471 Supraventricular tachycardia: Secondary | ICD-10-CM | POA: Diagnosis present

## 2015-03-14 DIAGNOSIS — Z8249 Family history of ischemic heart disease and other diseases of the circulatory system: Secondary | ICD-10-CM

## 2015-03-14 DIAGNOSIS — I252 Old myocardial infarction: Secondary | ICD-10-CM

## 2015-03-14 DIAGNOSIS — C679 Malignant neoplasm of bladder, unspecified: Secondary | ICD-10-CM | POA: Diagnosis present

## 2015-03-14 DIAGNOSIS — H409 Unspecified glaucoma: Secondary | ICD-10-CM | POA: Diagnosis present

## 2015-03-14 DIAGNOSIS — I517 Cardiomegaly: Secondary | ICD-10-CM | POA: Diagnosis present

## 2015-03-14 DIAGNOSIS — K449 Diaphragmatic hernia without obstruction or gangrene: Secondary | ICD-10-CM | POA: Diagnosis present

## 2015-03-14 DIAGNOSIS — Z7952 Long term (current) use of systemic steroids: Secondary | ICD-10-CM | POA: Diagnosis not present

## 2015-03-14 DIAGNOSIS — N19 Unspecified kidney failure: Secondary | ICD-10-CM

## 2015-03-14 DIAGNOSIS — Z09 Encounter for follow-up examination after completed treatment for conditions other than malignant neoplasm: Secondary | ICD-10-CM

## 2015-03-14 DIAGNOSIS — C689 Malignant neoplasm of urinary organ, unspecified: Secondary | ICD-10-CM | POA: Diagnosis present

## 2015-03-14 DIAGNOSIS — N183 Chronic kidney disease, stage 3 (moderate): Secondary | ICD-10-CM | POA: Diagnosis present

## 2015-03-14 LAB — CBC WITH DIFFERENTIAL/PLATELET
Basophils Absolute: 0 10*3/uL (ref 0.0–0.1)
Basophils Relative: 0 %
EOS PCT: 1 %
Eosinophils Absolute: 0.1 10*3/uL (ref 0.0–0.7)
HEMATOCRIT: 41.9 % (ref 39.0–52.0)
Hemoglobin: 13.5 g/dL (ref 13.0–17.0)
LYMPHS ABS: 1.3 10*3/uL (ref 0.7–4.0)
LYMPHS PCT: 9 %
MCH: 29.7 pg (ref 26.0–34.0)
MCHC: 32.2 g/dL (ref 30.0–36.0)
MCV: 92.3 fL (ref 78.0–100.0)
MONO ABS: 0.9 10*3/uL (ref 0.1–1.0)
Monocytes Relative: 7 %
Neutro Abs: 11.3 10*3/uL — ABNORMAL HIGH (ref 1.7–7.7)
Neutrophils Relative %: 83 %
PLATELETS: 125 10*3/uL — AB (ref 150–400)
RBC: 4.54 MIL/uL (ref 4.22–5.81)
RDW: 14.2 % (ref 11.5–15.5)
WBC: 13.6 10*3/uL — ABNORMAL HIGH (ref 4.0–10.5)

## 2015-03-14 LAB — I-STAT TROPONIN, ED: TROPONIN I, POC: 0.5 ng/mL — AB (ref 0.00–0.08)

## 2015-03-14 LAB — TROPONIN I
TROPONIN I: 0.72 ng/mL — AB (ref <0.031)
Troponin I: 1.05 ng/mL (ref <0.031)

## 2015-03-14 LAB — GLUCOSE, CAPILLARY: Glucose-Capillary: 170 mg/dL — ABNORMAL HIGH (ref 65–99)

## 2015-03-14 LAB — BLOOD GAS, VENOUS
Acid-base deficit: 2.1 mmol/L — ABNORMAL HIGH (ref 0.0–2.0)
BICARBONATE: 21.8 meq/L (ref 20.0–24.0)
Drawn by: 295031
FIO2: 0.21
O2 Saturation: 73.3 %
PH VEN: 7.394 — AB (ref 7.250–7.300)
PO2 VEN: 40.2 mmHg (ref 30.0–45.0)
Patient temperature: 98.6
TCO2: 19.1 mmol/L (ref 0–100)
pCO2, Ven: 36.4 mmHg — ABNORMAL LOW (ref 45.0–50.0)

## 2015-03-14 LAB — BASIC METABOLIC PANEL
Anion gap: 12 (ref 5–15)
BUN: 23 mg/dL — AB (ref 6–20)
CALCIUM: 8.2 mg/dL — AB (ref 8.9–10.3)
CO2: 22 mmol/L (ref 22–32)
CREATININE: 1.94 mg/dL — AB (ref 0.61–1.24)
Chloride: 102 mmol/L (ref 101–111)
GFR calc Af Amer: 35 mL/min — ABNORMAL LOW (ref >60)
GFR, EST NON AFRICAN AMERICAN: 30 mL/min — AB (ref >60)
Glucose, Bld: 203 mg/dL — ABNORMAL HIGH (ref 65–99)
Potassium: 4.1 mmol/L (ref 3.5–5.1)
SODIUM: 136 mmol/L (ref 135–145)

## 2015-03-14 LAB — D-DIMER, QUANTITATIVE (NOT AT ARMC)

## 2015-03-14 LAB — BRAIN NATRIURETIC PEPTIDE: B Natriuretic Peptide: 137 pg/mL — ABNORMAL HIGH (ref 0.0–100.0)

## 2015-03-14 MED ORDER — SODIUM CHLORIDE 0.9 % IJ SOLN
3.0000 mL | Freq: Two times a day (BID) | INTRAMUSCULAR | Status: DC
  Administered 2015-03-15 – 2015-03-20 (×7): 3 mL via INTRAVENOUS

## 2015-03-14 MED ORDER — HYOSCYAMINE SULFATE 0.125 MG SL SUBL
0.1250 mg | SUBLINGUAL_TABLET | SUBLINGUAL | Status: DC | PRN
  Administered 2015-03-15 – 2015-03-20 (×7): 0.125 mg via ORAL
  Filled 2015-03-14 (×11): qty 1

## 2015-03-14 MED ORDER — SENNA 8.6 MG PO TABS
1.0000 | ORAL_TABLET | Freq: Every day | ORAL | Status: DC
  Administered 2015-03-15 – 2015-03-20 (×6): 8.6 mg via ORAL
  Filled 2015-03-14 (×9): qty 1

## 2015-03-14 MED ORDER — OXYCODONE-ACETAMINOPHEN 5-325 MG PO TABS
1.0000 | ORAL_TABLET | ORAL | Status: DC | PRN
  Administered 2015-03-15 – 2015-03-18 (×6): 1 via ORAL
  Filled 2015-03-14 (×6): qty 1

## 2015-03-14 MED ORDER — ASPIRIN 81 MG PO CHEW
324.0000 mg | CHEWABLE_TABLET | Freq: Once | ORAL | Status: AC
  Administered 2015-03-14: 324 mg via ORAL
  Filled 2015-03-14: qty 4

## 2015-03-14 MED ORDER — SODIUM CHLORIDE 0.9 % IJ SOLN: 3.0000 mL | INTRAMUSCULAR | Status: DC | PRN

## 2015-03-14 MED ORDER — SODIUM CHLORIDE 0.9 % IV SOLN
INTRAVENOUS | Status: DC
  Administered 2015-03-14 – 2015-03-15 (×3): via INTRAVENOUS
  Administered 2015-03-16: 1000 mL via INTRAVENOUS

## 2015-03-14 MED ORDER — ONDANSETRON HCL 4 MG PO TABS: 4.0000 mg | ORAL_TABLET | Freq: Four times a day (QID) | ORAL | Status: DC | PRN

## 2015-03-14 MED ORDER — METOPROLOL TARTRATE 1 MG/ML IV SOLN
2.5000 mg | Freq: Once | INTRAVENOUS | Status: AC
  Administered 2015-03-14: 2.5 mg via INTRAVENOUS

## 2015-03-14 MED ORDER — DILTIAZEM HCL 100 MG IV SOLR
5.0000 mg/h | INTRAVENOUS | Status: DC
  Administered 2015-03-14: 7.5 mg/h via INTRAVENOUS
  Administered 2015-03-14 – 2015-03-16 (×3): 5 mg/h via INTRAVENOUS
  Filled 2015-03-14 (×3): qty 100

## 2015-03-14 MED ORDER — ADULT MULTIVITAMIN W/MINERALS CH
1.0000 | ORAL_TABLET | Freq: Every day | ORAL | Status: DC
  Administered 2015-03-15 – 2015-03-20 (×6): 1 via ORAL
  Filled 2015-03-14 (×6): qty 1

## 2015-03-14 MED ORDER — INSULIN ASPART 100 UNIT/ML ~~LOC~~ SOLN
0.0000 [IU] | Freq: Three times a day (TID) | SUBCUTANEOUS | Status: DC
  Administered 2015-03-14 – 2015-03-15 (×3): 2 [IU] via SUBCUTANEOUS
  Administered 2015-03-16: 1 [IU] via SUBCUTANEOUS
  Administered 2015-03-16 – 2015-03-17 (×2): 2 [IU] via SUBCUTANEOUS
  Administered 2015-03-17: 1 [IU] via SUBCUTANEOUS
  Administered 2015-03-18: 2 [IU] via SUBCUTANEOUS
  Administered 2015-03-19 (×2): 1 [IU] via SUBCUTANEOUS
  Administered 2015-03-19 – 2015-03-20 (×2): 2 [IU] via SUBCUTANEOUS

## 2015-03-14 MED ORDER — IPRATROPIUM-ALBUTEROL 0.5-2.5 (3) MG/3ML IN SOLN: 3.0000 mL | Freq: Once | RESPIRATORY_TRACT | Status: DC

## 2015-03-14 MED ORDER — SODIUM CHLORIDE 0.9 % IV BOLUS (SEPSIS)
250.0000 mL | Freq: Once | INTRAVENOUS | Status: AC
  Administered 2015-03-14: 250 mL via INTRAVENOUS

## 2015-03-14 MED ORDER — SODIUM CHLORIDE 0.9 % IJ SOLN
3.0000 mL | Freq: Two times a day (BID) | INTRAMUSCULAR | Status: DC
  Administered 2015-03-15 – 2015-03-20 (×10): 3 mL via INTRAVENOUS

## 2015-03-14 MED ORDER — PREDNISONE 5 MG PO TABS
7.5000 mg | ORAL_TABLET | Freq: Every day | ORAL | Status: DC
  Administered 2015-03-15 – 2015-03-20 (×6): 7.5 mg via ORAL
  Filled 2015-03-14 (×7): qty 2

## 2015-03-14 MED ORDER — METOPROLOL TARTRATE 1 MG/ML IV SOLN
INTRAVENOUS | Status: AC
  Administered 2015-03-14: 2.5 mg via INTRAVENOUS
  Filled 2015-03-14: qty 5

## 2015-03-14 MED ORDER — DILTIAZEM LOAD VIA INFUSION
20.0000 mg | Freq: Once | INTRAVENOUS | Status: AC
  Administered 2015-03-14: 20 mg via INTRAVENOUS
  Filled 2015-03-14: qty 20

## 2015-03-14 MED ORDER — DEXTROSE 5 % IV SOLN
1.0000 g | INTRAVENOUS | Status: DC
  Administered 2015-03-14 – 2015-03-19 (×6): 1 g via INTRAVENOUS
  Filled 2015-03-14 (×8): qty 10

## 2015-03-14 MED ORDER — ACETAMINOPHEN-CODEINE #3 300-30 MG PO TABS: 1.0000 | ORAL_TABLET | ORAL | Status: DC | PRN

## 2015-03-14 MED ORDER — MORPHINE SULFATE (PF) 2 MG/ML IV SOLN
1.0000 mg | INTRAVENOUS | Status: DC | PRN
Start: 2015-03-14 — End: 2015-03-20

## 2015-03-14 MED ORDER — DEXTROSE 5 % IV SOLN
INTRAVENOUS | Status: AC
  Administered 2015-03-14: 5 mg/h via INTRAVENOUS
  Filled 2015-03-14: qty 100

## 2015-03-14 MED ORDER — SODIUM CHLORIDE 0.9 % IV SOLN: 250.0000 mL | INTRAVENOUS | Status: DC | PRN

## 2015-03-14 MED ORDER — ONDANSETRON HCL 4 MG/2ML IJ SOLN
4.0000 mg | Freq: Four times a day (QID) | INTRAMUSCULAR | Status: DC | PRN
Start: 2015-03-14 — End: 2015-03-20

## 2015-03-14 NOTE — Progress Notes (Signed)
Pt HR has been sustaining above 180's on AF. Cardizem drip at 15mg /hr. Called and notified Dr Doylene Canard. New orders received. Also notified of Troponin- 1.05.

## 2015-03-14 NOTE — ED Notes (Signed)
Cardizem verified by Jonne Ply, RN

## 2015-03-14 NOTE — Consult Note (Signed)
Reason for Consult: Bladder Cancer Likely Metastatic, Left Ureteral Obstruction, Chronic Renal Insuficieny  Referring Physician: Niel Hummer  Craig Waters. is an 81 y.o. male.   HPI:   1 - Bladder Cancer Likely Metastatic - Known high grade bladder cancer s/p palliative resections at [facility] by Dr. [name].Last seen there 11/3 with planned palliative resection next month.  CT 01/2015 with liver lesion and Lt pelvic adenopathy concerning for likely metastatic disease.   2 - Left Ureteral Obstruction / Stone - left distal ureteral stone noted during admission for ARF 04/2014, left neph tube placed. Appears in good position by CT this admission 11/20.   3 - Chronic Renal Insufficieny - Pre 2016 Cr baseline 1. Episode acute renal failure 01/2015 due to likely malignant obstruction with Cr peak 12's. Most recent Cr's 1.8 range since. Cr on admission 1.94, no hyperkalemia. CT this admission with resolved left hydro and very mild right (likely early malignant obstruction) for which he does not want further drainage.   PMH includes chronic prednisone for rheumatoid.  Today " Craig Waters " is seen in consultation for above, specifically for further recommendations on further GU management. He is presently admitted for SOB / SVT, now on Dilt gtt, VQ pending.    Past Medical History  Diagnosis Date  . Blood in stool   . Diverticulitis   . GERD (gastroesophageal reflux disease)   . Glaucoma   . Ulcer   . History of transfusion of whole blood   . Prostate disorder   . Shortness of breath 10-18-11    with exertion, cardiac cath done 7-8 yrs ago negative.  . Vertigo 10-18-11    inner ear issues occ. -tx. Bonine as needed  . Arthritis 10-18-11    back, fingers  . H/O hiatal hernia 10-18-11    noted on recent CXR  . Adenomatous colon polyp   . Urothelial carcinoma ([place]) 05/2014  . PAF (paroxysmal atrial fibrillation) ([place])   . Rheumatoid arthritis ([place])   . Discitis of thoracic region 07/04/2012     Past Surgical History  Procedure Laterality Date  . Gastric ulcer  10-18-11    '91-blleding ulcer with blood transfusions after  . Appendectomy  10-18-11    age 71  . Cataract extraction, bilateral  10-18-11    bilateral   . Eye surgery  10-18-11    laser eye surgery s/p cataract bilaterally  . Cardiac catheterization  10-18-11    7-8 yrs ago-mild blockage,no tx. indicated  . Prostatectomy  10/27/2011    Procedure: PROSTATECTOMY SUPRAPUBIC;  Surgeon: Ailene Rud, MD;  Location: [facility];  Service: Urology;  Laterality: N/A;  Placement of suprapubic tube  . Cystoscopy  10/27/2011    Procedure: CYSTOSCOPY FLEXIBLE;  Surgeon: Ailene Rud, MD;  Location: [facility];  Service: Urology;  Laterality: N/A;  . Transurethral resection of bladder tumor with gyrus (turbt-gyrus)  06/2014    T1 HG tumor.  [facility].    Family History  Problem Relation Age of Onset  . Breast cancer Sister   . Dementia Mother   . Heart attack Father   . Colon cancer Neg Hx     Social History:  reports that he quit smoking about 36 years ago. His smoking use included Cigarettes. He has a 5 pack-year smoking history. His smokeless tobacco use includes Chew. He reports that he does not drink alcohol or use illicit drugs.  Allergies: No Known Allergies   Results for orders placed or performed  during the hospital encounter of 03/14/15 (from the past 48 hour(s))  Basic metabolic panel     Status: Abnormal   Collection Time: 03/14/15 12:20 PM  Result Value Ref Range   Sodium 136 135 - 145 mmol/L   Potassium 4.1 3.5 - 5.1 mmol/L   Chloride 102 101 - 111 mmol/L   CO2 22 22 - 32 mmol/L   Glucose, Bld 203 (H) 65 - 99 mg/dL   BUN 23 (H) 6 - 20 mg/dL   Creatinine, Ser 1.94 (H) 0.61 - 1.24 mg/dL   Calcium 8.2 (L) 8.9 - 10.3 mg/dL   GFR calc non Af Amer 30 (L) >60 mL/min   GFR calc Af Amer 35 (L) >60 mL/min    Comment: (NOTE) The eGFR has been calculated using the CKD EPI equation. This calculation has  not been validated in all clinical situations. eGFR's persistently <60 mL/min signify possible Chronic Kidney Disease.    Anion gap 12 5 - 15  Brain natriuretic peptide     Status: Abnormal   Collection Time: 03/14/15 12:20 PM  Result Value Ref Range   B Natriuretic Peptide 137.0 (H) 0.0 - 100.0 pg/mL  CBC with Differential/Platelet     Status: Abnormal   Collection Time: 03/14/15 12:20 PM  Result Value Ref Range   WBC 13.6 (H) 4.0 - 10.5 K/uL   RBC 4.54 4.22 - 5.81 MIL/uL   Hemoglobin 13.5 13.0 - 17.0 g/dL   HCT 41.9 39.0 - 52.0 %   MCV 92.3 78.0 - 100.0 fL   MCH 29.7 26.0 - 34.0 pg   MCHC 32.2 30.0 - 36.0 g/dL   RDW 14.2 11.5 - 15.5 %   Platelets 125 (L) 150 - 400 K/uL   Neutrophils Relative % 83 %   Neutro Abs 11.3 (H) 1.7 - 7.7 K/uL   Lymphocytes Relative 9 %   Lymphs Abs 1.3 0.7 - 4.0 K/uL   Monocytes Relative 7 %   Monocytes Absolute 0.9 0.1 - 1.0 K/uL   Eosinophils Relative 1 %   Eosinophils Absolute 0.1 0.0 - 0.7 K/uL   Basophils Relative 0 %   Basophils Absolute 0.0 0.0 - 0.1 K/uL  Blood gas, venous     Status: Abnormal   Collection Time: 03/14/15 12:21 PM  Result Value Ref Range   FIO2 0.21    Delivery systems ROOM AIR    pH, Ven 7.394 (H) 7.250 - 7.300   pCO2, Ven 36.4 (L) 45.0 - 50.0 mmHg   pO2, Ven 40.2 30.0 - 45.0 mmHg   Bicarbonate 21.8 20.0 - 24.0 mEq/L   TCO2 19.1 0 - 100 mmol/L   Acid-base deficit 2.1 (H) 0.0 - 2.0 mmol/L   O2 Saturation 73.3 %   Patient temperature 98.6    Collection site VEIN    Drawn by [phone]    Sample type VEIN   I-stat troponin, ED     Status: Abnormal   Collection Time: 03/14/15 12:29 PM  Result Value Ref Range   Troponin i, poc 0.50 (HH) 0.00 - 0.08 ng/mL   Comment NOTIFIED PHYSICIAN    Comment 3            Comment: Due to the release kinetics of cTnI, a negative result within the first hours of the onset of symptoms does not rule out myocardial infarction with certainty. If myocardial infarction is still  suspected, repeat the test at appropriate intervals.     Dg Chest Port 1 View  03/14/2015  CLINICAL  DATA:  Shortness of breath EXAM: PORTABLE CHEST 1 VIEW COMPARISON:  02/15/2015 chest radiograph. FINDINGS: Stable cardiomediastinal silhouette with mild cardiomegaly. No pneumothorax. Possible stable trace right pleural effusion. No appreciable left pleural effusion. No overt pulmonary edema. Stable elevation of the right hemidiaphragm. Stable right basilar curvilinear opacities, favor atelectasis. No new focal lung opacity. IMPRESSION: 1. Stable mild cardiomegaly without overt pulmonary edema. 2. Stable elevation of the right hemidiaphragm with likely stable trace right pleural effusion. 3. Stable curvilinear right basal lung opacities, favor atelectasis. No new focal lung opacity. Electronically Signed   By: Ilona Sorrel M.D.   On: 03/14/2015 13:18    Review of Systems  Constitutional: Positive for weight loss and malaise/fatigue. Negative for fever.  HENT: Negative.   Eyes: Negative.   Respiratory: Positive for shortness of breath.   Cardiovascular: Negative.   Gastrointestinal: Negative.   Genitourinary: Positive for hematuria. Negative for flank pain.  Musculoskeletal: Negative.   Skin: Negative.   Neurological: Negative.   Endo/Heme/Allergies: Negative.   Psychiatric/Behavioral: Negative.    Blood pressure 133/58, pulse 67, temperature 98.3 F (36.8 C), temperature source Oral, resp. rate 23, height 5' 11"  (1.803 m), weight 91.9 kg (202 lb 9.6 oz), SpO2 94 %. Physical Exam  Constitutional: He appears well-developed.  HENT:  Head: Normocephalic.  Eyes: Pupils are equal, round, and reactive to light.  Neck: Normal range of motion.  Cardiovascular: Normal rate.   Respiratory: Effort normal.  Mild increase WOB  GI: Soft.  Genitourinary: Penis normal.  Dark bloody urine in foley. Left neph tube with clear urine.   Musculoskeletal: Normal range of motion.  Neurological: He is  alert.  Skin: Skin is warm.  Psychiatric: He has a normal mood and affect. His behavior is normal. Thought content normal.    Assessment/Plan:  1 - Bladder Cancer Likely Metastatic - overall prognosis is poor with life expectancy <2 years, likely substantially less given level of comorbidity. Agree with palliative approach to this with prn resections which he has pending next month. Discussed this with pt today and it appears he may not have been previously counseled on the gravity of his malignancy.   2 - Left Ureteral Obstruction / Stone - his left nephrostomy tube is in good position and Cr at baseline. Management per [facility]rolog[facility] at [facility]. No indications for acute intervention this hospitalization.   3 - Chronic Renal Insufficieny - Cr at recent baseline. Should GFR substantially worsen or he develop clot retenion, next step would be right nephrostomy tube to completely divert urine from bladder which would likely improve GFR some and allow bladder to clot off.  Will follow, call with questions anytime.   Yalissa Fink 03/14/2015, 4:42 PM

## 2015-03-14 NOTE — ED Notes (Signed)
Presents from home via EMS for increased shortness of breath. EMS found pt tachypneic RR32, room air sat 86%. PMHx renal failure (left nephrostomy), bladder cancer, afib, and currently being treated for UTI with bactrim (has indwelling foley cath in place).

## 2015-03-14 NOTE — ED Notes (Signed)
Bed: WA07 Expected date: 03/14/15 Expected time: 11:41 AM Means of arrival: Ambulance Comments: Increased SHOB

## 2015-03-14 NOTE — H&P (Signed)
[facility] History and Physical  Craig Waters. [id number] DOB: December 29, 1930 DOA: 03/14/2015  Referring physician: Dr Laneta Simmers PCP: [name], MD   Chief Complaint:   HPI: Craig Wobig. is a 81 y.o. male a PMH of rheumatoid arthritis on chronic prednisone and other immunosuppressive therapy, high-grade urothelial cell carcinoma, under the care of Dr. [name] at [facility], recently admitted to hospital 10-31: for AKI, due to obstructive uropathy, had nephrostomy tube place   Patient presents with increase SOB, palpitation. He relates dyspnea for months but worse for last 2 days. He denies chest pain. He has not had surgery for urothelial cancer done. His urine continue to be bloody. He has left side nephrostomy tube in place. He was started on bactrim on friday for possible UTI, by his PCP.   Evaluation in the ED; He was found to be in A fib RVR. Chest x ray: stable cardiomegaly, mild elevated troponin at 0.50.   Review of Systems:  Negative,except asper HPI  Past Medical History  Diagnosis Date  . Blood in stool   . Diverticulitis   . GERD (gastroesophageal reflux disease)   . Glaucoma   . Ulcer   . History of transfusion of whole blood   . Prostate disorder   . Shortness of breath 10-18-11    with exertion, cardiac cath done 7-8 yrs ago negative.  . Vertigo 10-18-11    inner ear issues occ. -tx. Bonine as needed  . Arthritis 10-18-11    back, fingers  . H/O hiatal hernia 10-18-11    noted on recent CXR  . Adenomatous colon polyp   . Urothelial carcinoma ([place]) 05/2014  . PAF (paroxysmal atrial fibrillation) ([place])   . Rheumatoid arthritis ([place])   . Discitis of thoracic region 07/04/2012   Past Surgical History  Procedure Laterality Date  . Gastric ulcer  10-18-11    '91-blleding ulcer with blood transfusions after  . Appendectomy  10-18-11    age 30  . Cataract extraction, bilateral  10-18-11    bilateral   . Eye surgery  10-18-11    laser eye surgery  s/p cataract bilaterally  . Cardiac catheterization  10-18-11    7-8 yrs ago-mild blockage,no tx. indicated  . Prostatectomy  10/27/2011    Procedure: PROSTATECTOMY SUPRAPUBIC;  Surgeon: Ailene Rud, MD;  Location: [facility];  Service: Urology;  Laterality: N/A;  Placement of suprapubic tube  . Cystoscopy  10/27/2011    Procedure: CYSTOSCOPY FLEXIBLE;  Surgeon: Ailene Rud, MD;  Location: [facility];  Service: Urology;  Laterality: N/A;  . Transurethral resection of bladder tumor with gyrus (turbt-gyrus)  06/2014    T1 HG tumor.  [facility].   Social History:  reports that he quit smoking about 36 years ago. His smoking use included Cigarettes. He has a 5 pack-year smoking history. His smokeless tobacco use includes Chew. He reports that he does not drink alcohol or use illicit drugs.  No Known Allergies  Family History  Problem Relation Age of Onset  . Breast cancer Sister   . Dementia Mother   . Heart attack Father   . Colon cancer Neg Hx     Prior to Admission medications   Medication Sig Start Date End Date Taking? Authorizing Provider  acetaminophen (TYLENOL) 650 MG CR tablet Take 1,300 mg by mouth every 6 (six) hours as needed for pain.   Yes Historical Provider, MD  acetaminophen-codeine (TYLENOL #3) 300-30 MG tablet Take 1 tablet by mouth every 4 (  four) hours as needed for moderate pain.   Yes Historical Provider, MD  b complex vitamins tablet Take 1 tablet by mouth daily.   Yes Historical Provider, MD  Calcium Citrate-Vitamin D (CALCIUM + D PO) Take 1 tablet by mouth daily.   Yes Historical Provider, MD  diltiazem (TIAZAC) 180 MG 24 hr capsule Take 180 mg by mouth daily.   Yes Historical Provider, MD  hyoscyamine (LEVSIN SL) 0.125 MG SL tablet Take 0.125 mg by mouth every 4 (four) hours as needed (bladder spasms).  02/01/15  Yes Historical Provider, MD  influenza vac recombinant HA trivalent (FLUBLOK) injection Inject 0.5 mLs into the muscle once.   Yes Historical  Provider, MD  Multiple Vitamin (MULTIVITAMIN WITH MINERALS) TABS tablet Take 1 tablet by mouth daily. 05/15/14  Yes Estela Leonie Green, MD  ondansetron (ZOFRAN) 4 MG tablet Take 1 tablet (4 mg total) by mouth every 6 (six) hours as needed for nausea. 02/22/15  Yes Donne Hazel, MD  oxyCODONE-acetaminophen (PERCOCET/ROXICET) 5-325 MG tablet Take 1 tablet by mouth every 4 (four) hours as needed for moderate pain. 02/22/15  Yes Donne Hazel, MD  polyethylene glycol powder (GLYCOLAX/MIRALAX) powder Take 255 g by mouth daily. Patient taking differently: Take 17 g by mouth daily.  11/27/14  Yes Willia Craze, NP  predniSONE (DELTASONE) 5 MG tablet Take 7.5 mg by mouth daily with breakfast. .   Yes Historical Provider, MD  senna (SENOKOT) 8.6 MG tablet Take 1 tablet by mouth daily.   Yes Historical Provider, MD  sulfamethoxazole-trimethoprim (BACTRIM DS,SEPTRA DS) 800-160 MG tablet Take 1 tablet by mouth 2 (two) times daily. Started 11/16 for 10 days 03/10/15  Yes Historical Provider, MD  traMADol (ULTRAM) 50 MG tablet Take 50 mg by mouth every 6 (six) hours as needed for moderate pain.   Yes Historical Provider, MD   Physical Exam: Filed Vitals:   03/14/15 1308 03/14/15 1324 03/14/15 1400 03/14/15 1515  BP: 109/64 103/64 110/70 105/66  Pulse: 104 111 113 88  Temp:      TempSrc:      Resp: 18 19 18 21   SpO2: 93% 94% 93% 94%    Wt Readings from Last 3 Encounters:  02/22/15 96.7 kg (213 lb 3 oz)  11/27/14 93.004 kg (205 lb 0.6 oz)  07/29/14 90.719 kg (200 lb)    General:  Appears calm and comfortable Eyes: PERRL, normal lids, irises & conjunctiva ENT: grossly normal hearing, lips & tongue Neck: no LAD, masses or thyromegaly Cardiovascular: RRR, no m/r/g. No LE edema. Telemetry: SR, no arrhythmias  Respiratory: CTA bilaterally, no w/r/r. Normal respiratory effort. Abdomen: soft, ntnd, left side nephrostomy tube in place Skin: no rash or induration seen on limited  exam Musculoskeletal: grossly normal tone BUE/BLE Psychiatric: grossly normal mood and affect, speech fluent and appropriate Neurologic: grossly non-focal.          Labs on Admission:  Basic Metabolic Panel:  Recent Labs Lab 03/14/15 1220  NA 136  K 4.1  CL 102  CO2 22  GLUCOSE 203*  BUN 23*  CREATININE 1.94*  CALCIUM 8.2*   Liver Function Tests: No results for input(s): AST, ALT, ALKPHOS, BILITOT, PROT, ALBUMIN in the last 168 hours. No results for input(s): LIPASE, AMYLASE in the last 168 hours. No results for input(s): AMMONIA in the last 168 hours. CBC:  Recent Labs Lab 03/14/15 1220  WBC 13.6*  NEUTROABS 11.3*  HGB 13.5  HCT 41.9  MCV 92.3  PLT  125*   Cardiac Enzymes: No results for input(s): CKTOTAL, CKMB, CKMBINDEX, TROPONINI in the last 168 hours.  BNP (last 3 results)  Recent Labs  02/15/15 1206 03/14/15 1220  BNP 522.9* 137.0*    ProBNP (last 3 results) No results for input(s): PROBNP in the last 8760 hours.  CBG: No results for input(s): GLUCAP in the last 168 hours.  Radiological Exams on Admission: Dg Chest Port 1 View  03/14/2015  CLINICAL DATA:  Shortness of breath EXAM: PORTABLE CHEST 1 VIEW COMPARISON:  02/15/2015 chest radiograph. FINDINGS: Stable cardiomediastinal silhouette with mild cardiomegaly. No pneumothorax. Possible stable trace right pleural effusion. No appreciable left pleural effusion. No overt pulmonary edema. Stable elevation of the right hemidiaphragm. Stable right basilar curvilinear opacities, favor atelectasis. No new focal lung opacity. IMPRESSION: 1. Stable mild cardiomegaly without overt pulmonary edema. 2. Stable elevation of the right hemidiaphragm with likely stable trace right pleural effusion. 3. Stable curvilinear right basal lung opacities, favor atelectasis. No new focal lung opacity. Electronically Signed   By: Ilona Sorrel M.D.   On: 03/14/2015 13:18    EKG: Independently reviewed. Supra-ventricular  tachycardia.   Assessment/Plan Principal Problem:   Atrial fibrillation with RVR (HCC) Active Problems:   HTN (hypertension)   Urothelial carcinoma (HCC)   Rheumatoid arthritis (HCC)   Atrial flutter with rapid ventricular response (HCC)   Acute on chronic renal failure (HCC)  1-A fib RVR; continue with Cardizem Gtt.  Cardiology consulted.  Was not on anticoagulation in anticipation to surgery and hematuria.   2-Mild elevated troponin:  In setting of A fib RVR.  Denies chest pain.  Cardiology consulted.  Check ECHO.   3-Acute on chronic renal failure: H/O Urothelia cancer, bilateral hydronephrosis, left side nephrostomy in place.  Cr at 1.9. Will order [place], will inform urology of admission.  He was on bactrim that could increase BUN,   4-Hyperglycemia; SSI. Check Hb A1c.   5-Recent UTI; leukocytosis; repeat UA, Urine culture. IV ceftriaxone.   Code Status: Full Code.  DVT Prophylaxis: SCD.  Family Communication: Care discussed with patient  Disposition Plan: expect 2 to 3 days inpatient.   Time spent: 75 minutes  [place], [place] [facility] Pager [phone]

## 2015-03-14 NOTE — Consult Note (Signed)
Referring Physician:Dr. Kohut/Dr. Rolm Gala Cardiology: Dr. Charolette Forward  Deshone Lyssy. is an 81 y.o. male.                       Chief Complaint: Shortness of breath  HPI: 81 y.o. male a PMH of rheumatoid arthritis on chronic prednisone and other immunosuppressive therapy, high-grade urothelial cell carcinoma, under the care of Dr. [name] at [facility], (recently admitted to hospital 10-31: for AKI, due to obstructive uropathy, had nephrostomy tube place) presents with increase SOB, palpitation. EKG shows SVT with S1Q3T3. Post IV diltiazem the rhythm is sinus tachycardia with frequent APCs. Echocardiogram done 3 weeks ago showed moderate LVH with normal systolic function.  Past Medical History  Diagnosis Date  . Blood in stool   . Diverticulitis   . GERD (gastroesophageal reflux disease)   . Glaucoma   . Ulcer   . History of transfusion of whole blood   . Prostate disorder   . Shortness of breath 10-18-11    with exertion, cardiac cath done 7-8 yrs ago negative.  . Vertigo 10-18-11    inner ear issues occ. -tx. Bonine as needed  . Arthritis 10-18-11    back, fingers  . H/O hiatal hernia 10-18-11    noted on recent CXR  . Adenomatous colon polyp   . Urothelial carcinoma ([place]) 05/2014  . PAF (paroxysmal atrial fibrillation) ([place])   . Rheumatoid arthritis ([place])   . Discitis of thoracic region 07/04/2012      Past Surgical History  Procedure Laterality Date  . Gastric ulcer  10-18-11    '91-blleding ulcer with blood transfusions after  . Appendectomy  10-18-11    age 59  . Cataract extraction, bilateral  10-18-11    bilateral   . Eye surgery  10-18-11    laser eye surgery s/p cataract bilaterally  . Cardiac catheterization  10-18-11    7-8 yrs ago-mild blockage,no tx. indicated  . Prostatectomy  10/27/2011    Procedure: PROSTATECTOMY SUPRAPUBIC;  Surgeon: Ailene Rud, MD;  Location: [facility];  Service: Urology;  Laterality: N/A;  Placement of suprapubic tube  .  Cystoscopy  10/27/2011    Procedure: CYSTOSCOPY FLEXIBLE;  Surgeon: Ailene Rud, MD;  Location: [facility];  Service: Urology;  Laterality: N/A;  . Transurethral resection of bladder tumor with gyrus (turbt-gyrus)  06/2014    T1 HG tumor.  [facility].    Family History  Problem Relation Age of Onset  . Breast cancer Sister   . Dementia Mother   . Heart attack Father   . Colon cancer Neg Hx    Social History:  reports that he quit smoking about 36 years ago. His smoking use included Cigarettes. He has a 5 pack-year smoking history. His smokeless tobacco use includes Chew. He reports that he does not drink alcohol or use illicit drugs.  Allergies: No Known Allergies   (Not in a hospital admission)  Results for orders placed or performed during the hospital encounter of 03/14/15 (from the past 48 hour(s))  Basic metabolic panel     Status: Abnormal   Collection Time: 03/14/15 12:20 PM  Result Value Ref Range   Sodium 136 135 - 145 mmol/L   Potassium 4.1 3.5 - 5.1 mmol/L   Chloride 102 101 - 111 mmol/L   CO2 22 22 - 32 mmol/L   Glucose, Bld 203 (H) 65 - 99 mg/dL   BUN 23 (H) 6 - 20 mg/dL  Creatinine, Ser 1.94 (H) 0.61 - 1.24 mg/dL   Calcium 8.2 (L) 8.9 - 10.3 mg/dL   GFR calc non Af Amer 30 (L) >60 mL/min   GFR calc Af Amer 35 (L) >60 mL/min    Comment: (NOTE) The eGFR has been calculated using the CKD EPI equation. This calculation has not been validated in all clinical situations. eGFR's persistently <60 mL/min signify possible Chronic Kidney Disease.    Anion gap 12 5 - 15  Brain natriuretic peptide     Status: Abnormal   Collection Time: 03/14/15 12:20 PM  Result Value Ref Range   B Natriuretic Peptide 137.0 (H) 0.0 - 100.0 pg/mL  CBC with Differential/Platelet     Status: Abnormal   Collection Time: 03/14/15 12:20 PM  Result Value Ref Range   WBC 13.6 (H) 4.0 - 10.5 K/uL   RBC 4.54 4.22 - 5.81 MIL/uL   Hemoglobin 13.5 13.0 - 17.0 g/dL   HCT 41.9 39.0 - 52.0 %    MCV 92.3 78.0 - 100.0 fL   MCH 29.7 26.0 - 34.0 pg   MCHC 32.2 30.0 - 36.0 g/dL   RDW 14.2 11.5 - 15.5 %   Platelets 125 (L) 150 - 400 K/uL   Neutrophils Relative % 83 %   Neutro Abs 11.3 (H) 1.7 - 7.7 K/uL   Lymphocytes Relative 9 %   Lymphs Abs 1.3 0.7 - 4.0 K/uL   Monocytes Relative 7 %   Monocytes Absolute 0.9 0.1 - 1.0 K/uL   Eosinophils Relative 1 %   Eosinophils Absolute 0.1 0.0 - 0.7 K/uL   Basophils Relative 0 %   Basophils Absolute 0.0 0.0 - 0.1 K/uL  Blood gas, venous     Status: Abnormal   Collection Time: 03/14/15 12:21 PM  Result Value Ref Range   FIO2 0.21    Delivery systems ROOM AIR    pH, Ven 7.394 (H) 7.250 - 7.300   pCO2, Ven 36.4 (L) 45.0 - 50.0 mmHg   pO2, Ven 40.2 30.0 - 45.0 mmHg   Bicarbonate 21.8 20.0 - 24.0 mEq/L   TCO2 19.1 0 - 100 mmol/L   Acid-base deficit 2.1 (H) 0.0 - 2.0 mmol/L   O2 Saturation 73.3 %   Patient temperature 98.6    Collection site VEIN    Drawn by [phone]    Sample type VEIN   I-stat troponin, ED     Status: Abnormal   Collection Time: 03/14/15 12:29 PM  Result Value Ref Range   Troponin i, poc 0.50 (HH) 0.00 - 0.08 ng/mL   Comment NOTIFIED PHYSICIAN    Comment 3            Comment: Due to the release kinetics of cTnI, a negative result within the first hours of the onset of symptoms does not rule out myocardial infarction with certainty. If myocardial infarction is still suspected, repeat the test at appropriate intervals.    Dg Chest Port 1 View  03/14/2015  CLINICAL DATA:  Shortness of breath EXAM: PORTABLE CHEST 1 VIEW COMPARISON:  02/15/2015 chest radiograph. FINDINGS: Stable cardiomediastinal silhouette with mild cardiomegaly. No pneumothorax. Possible stable trace right pleural effusion. No appreciable left pleural effusion. No overt pulmonary edema. Stable elevation of the right hemidiaphragm. Stable right basilar curvilinear opacities, favor atelectasis. No new focal lung opacity. IMPRESSION: 1. Stable mild  cardiomegaly without overt pulmonary edema. 2. Stable elevation of the right hemidiaphragm with likely stable trace right pleural effusion. 3. Stable curvilinear right basal lung opacities,  favor atelectasis. No new focal lung opacity. Electronically Signed   By: Ilona Sorrel M.D.   On: 03/14/2015 13:18    Review Of Systems Constitutional: Negative for fever.  HENT: Negative for congestion, facial swelling and trouble swallowing.  Respiratory: Negative for cough and shortness of breath.  Cardiovascular: Negative for chest pain.  Gastrointestinal: Negative for nausea, vomiting, abdominal pain and diarrhea.  Genitourinary: Negative for difficulty urinating. Positive for prostatectomy and urothelial cell carcinoma Musculoskeletal: Positive for back pain. Negative for gait problem.  Skin: Negative for rash.  Neurological: Positive for weakness.  All other systems reviewed and are negative.  Blood pressure 105/66, pulse 88, temperature 98.2 F (36.8 C), temperature source Oral, resp. rate 21, SpO2 94 %. Physical Exam: Gen: No acute distress. Well built and nourished. Head: Normocephalic, atraumatic. Eyes: PERRL, EOMI, sclerae nonicteric. Mouth: Oropharynx clear, tongue dry. Neck: Supple, no thyromegaly, no lymphadenopathy, no jugular venous distention. Chest: Lungs clear to auscultation bilaterally. CV: Heart sounds are mildly tachycardic. No murmurs, rubs, or gallops.II/VI diastolic murmur. Abdomen: Soft, nontender, nondistended with normal active bowel sounds. Extremities: Extremities show trace pitting edema bilaterally. Skin: Warm and dry. Neuro: Lethargic, nonfocal. Psych: Mood and affect flat.  Assessment/Plan Shortness of breath r/o PE SVT Abnormal troponin-I, demand ischemia Moderate LV hypertrophy without obstruction Hypertension Urothelial Carcinoma Rheumatoid arthritis Acute on chronic renal failure with left side nephrostomy tube in  place. Dehydration Hyperglycemia  Continue diltiazem. IV fluid as tolerated. D-dimer with possible V-Q scan   [facility] S, MD  03/14/2015, 3:49 PM

## 2015-03-14 NOTE — ED Notes (Signed)
MD at bedside. 

## 2015-03-14 NOTE — ED Provider Notes (Signed)
CSN: [id number]     Arrival date & time 03/14/15  1150 History   First MD Initiated Contact with Patient 03/14/15 1159     Chief Complaint  Patient presents with  . Shortness of Breath     (Consider location/radiation/quality/duration/timing/severity/associated sxs/prior Treatment) Patient is a 81 y.o. male presenting with shortness of breath. The history is provided by the patient.  Shortness of Breath Severity:  Moderate Onset quality:  Gradual Duration:  1 day (progressive worsening last few weeks) Timing:  Constant Progression:  Worsening Chronicity:  Recurrent Context: activity   Relieved by:  Nothing Worsened by:  Nothing tried Ineffective treatments:  None tried Associated symptoms: no cough, no fever and no vomiting   Associated symptoms comment:  Constipation Risk factors: obesity   Risk factors comment:  CKD   Past Medical History  Diagnosis Date  . Blood in stool   . Diverticulitis   . GERD (gastroesophageal reflux disease)   . Glaucoma   . Ulcer   . History of transfusion of whole blood   . Prostate disorder   . Shortness of breath 10-18-11    with exertion, cardiac cath done 7-8 yrs ago negative.  . Vertigo 10-18-11    inner ear issues occ. -tx. Bonine as needed  . Arthritis 10-18-11    back, fingers  . H/O hiatal hernia 10-18-11    noted on recent CXR  . Adenomatous colon polyp   . Urothelial carcinoma ([place]) 05/2014  . PAF (paroxysmal atrial fibrillation) ([place])   . Rheumatoid arthritis ([place])   . Discitis of thoracic region 07/04/2012   Past Surgical History  Procedure Laterality Date  . Gastric ulcer  10-18-11    '91-blleding ulcer with blood transfusions after  . Appendectomy  10-18-11    age 61  . Cataract extraction, bilateral  10-18-11    bilateral   . Eye surgery  10-18-11    laser eye surgery s/p cataract bilaterally  . Cardiac catheterization  10-18-11    7-8 yrs ago-mild blockage,no tx. indicated  . Prostatectomy  10/27/2011    Procedure:  PROSTATECTOMY SUPRAPUBIC;  Surgeon: Ailene Rud, MD;  Location: [facility];  Service: Urology;  Laterality: N/A;  Placement of suprapubic tube  . Cystoscopy  10/27/2011    Procedure: CYSTOSCOPY FLEXIBLE;  Surgeon: Ailene Rud, MD;  Location: [facility];  Service: Urology;  Laterality: N/A;  . Transurethral resection of bladder tumor with gyrus (turbt-gyrus)  06/2014    T1 HG tumor.  [facility].   Family History  Problem Relation Age of Onset  . Breast cancer Sister   . Dementia Mother   . Heart attack Father   . Colon cancer Neg Hx    Social History  Substance Use Topics  . Smoking status: Former Smoker -- 1.00 packs/day for 5 years    Types: Cigarettes    Quit date: 09/26/1978  . Smokeless tobacco: Current User    Types: Chew    Last Attempt to Quit: 11/01/2011  . Alcohol Use: No    Review of Systems  Constitutional: Negative for fever.  Respiratory: Positive for shortness of breath. Negative for cough.   Gastrointestinal: Negative for vomiting.  All other systems reviewed and are negative.     Allergies  Other  Home Medications   Prior to Admission medications   Medication Sig Start Date End Date Taking? Authorizing Provider  acetaminophen (TYLENOL) 650 MG CR tablet Take 1,300 mg by mouth every 6 (six) hours as needed for  pain.    Historical Provider, MD  b complex vitamins tablet Take 1 tablet by mouth daily.    Historical Provider, MD  Calcium Citrate-Vitamin D (CALCIUM + D PO) Take 1 tablet by mouth daily.    Historical Provider, MD  diltiazem (TIAZAC) 180 MG 24 hr capsule Take 180 mg by mouth daily.    Historical Provider, MD  hyoscyamine (LEVSIN SL) 0.125 MG SL tablet Take 0.125 mg by mouth every 4 (four) hours as needed (bladder spasms).  02/01/15   Historical Provider, MD  Multiple Vitamin (MULTIVITAMIN WITH MINERALS) TABS tablet Take 1 tablet by mouth daily. 05/15/14   Erline Hau, MD  ondansetron (ZOFRAN) 4 MG tablet Take 1 tablet (4 mg  total) by mouth every 6 (six) hours as needed for nausea. 02/22/15   Donne Hazel, MD  oxyCODONE-acetaminophen (PERCOCET/ROXICET) 5-325 MG tablet Take 1 tablet by mouth every 4 (four) hours as needed for moderate pain. 02/22/15   Donne Hazel, MD  polyethylene glycol powder (GLYCOLAX/MIRALAX) powder Take 255 g by mouth daily. Patient taking differently: Take 17 g by mouth daily.  11/27/14   Willia Craze, NP  predniSONE (DELTASONE) 5 MG tablet Take 5-12.5 mg by mouth daily with breakfast. 10-18 to 10-31 12.5 mg, 11-1 to  11-14 10 mg, 11-15 to 11-29 7.5 mg, 11-30 to 12-13 5 mg then back to the MD for further directions.    Historical Provider, MD  traMADol (ULTRAM) 50 MG tablet Take 50 mg by mouth every 6 (six) hours as needed for moderate pain.    Historical Provider, MD   BP 116/65 mmHg  Pulse 110  Temp(Src) 98.2 F (36.8 C) (Oral)  Resp 19  SpO2 93% Physical Exam  Constitutional: He is oriented to person, place, and time. He appears well-developed and well-nourished. No distress.  HENT:  Head: Normocephalic and atraumatic.  Eyes: Conjunctivae are normal.  Neck: Neck supple. No tracheal deviation present.  Cardiovascular: Regular rhythm and normal heart sounds.  Tachycardia present.   Pulmonary/Chest: Effort normal and breath sounds normal. No respiratory distress.  Abdominal: Soft. He exhibits no distension.  Neurological: He is alert and oriented to person, place, and time.  Skin: Skin is warm and dry.  Psychiatric: He has a normal mood and affect.    ED Course  Procedures (including critical care time) CRITICAL CARE Performed by: Leo Grosser Total critical care time: 30 minutes Critical care time was exclusive of separately billable procedures and treating other patients. Critical care was necessary to treat or prevent imminent or life-threatening deterioration. Critical care was time spent personally by me on the following activities: development of treatment plan with  patient and/or surrogate as well as nursing, discussions with consultants, evaluation of patient's response to treatment, examination of patient, obtaining history from patient or surrogate, ordering and performing treatments and interventions, ordering and review of laboratory studies, ordering and review of radiographic studies, pulse oximetry and re-evaluation of patient's condition.  Labs Review Labs Reviewed  CBC WITH DIFFERENTIAL/PLATELET - Abnormal; Notable for the following:    WBC 13.6 (*)    Platelets 125 (*)    Neutro Abs 11.3 (*)    All other components within normal limits  I-STAT TROPOININ, ED - Abnormal; Notable for the following:    Troponin i, poc 0.50 (*)    All other components within normal limits  BASIC METABOLIC PANEL  BRAIN NATRIURETIC PEPTIDE  BLOOD GAS, VENOUS    Imaging Review Ct Abdomen Pelvis Wo  Contrast  03/14/2015  CLINICAL DATA:  Acute on chronic renal failure. Prostatectomy. Hypertension. Uroepithelial cancer diagnosed 2/16. Left nephrostomy tube. Possible urinary tract infection. EXAM: CT ABDOMEN AND PELVIS WITHOUT CONTRAST TECHNIQUE: Multidetector CT imaging of the abdomen and pelvis was performed following the standard protocol without IV contrast. COMPARISON:  Plain films 02/20/2015.  Most recent CT 02/15/2015. FINDINGS: Lower chest: Volume loss with probable scarring at the anterior left lung base. Mild cardiomegaly with LAD coronary artery atherosclerosis. Right pulmonary artery enlargement at 3.5 cm (image 1, series 2). Right hemidiaphragm elevation. Hepatobiliary: Suboptimal evaluation of the hypo attenuating posterior right hepatic lobe lesion secondary lack of IV contrast. Measures on the order of 4.2 cm in anterior posterior dimension today versus similar (when remeasured). No definite new liver lesions identified. Normal gallbladder, without biliary ductal dilatation. Pancreas: Mild pancreatic atrophy, within normal variation for age. No duct  dilatation. Spleen: Normal in size, without focal abnormality. Adrenals/Urinary Tract: Normal adrenal glands. Low-density bilateral renal lesions are likely cysts. Bilateral renal cortical thinning. Placement of a left-sided nephrostomy catheter since the prior CT, with resolution of hydronephrosis. Mild right-sided hydroureteronephrosis is similar. Ureteric prominence followed to the level of the urinary bladder. Distal left ureteric stone is approximately 2 cm proximal to the bladder and measures 5 mm (image 86, series 2). Similar in position to on the prior. Bladder decompressed around a Foley catheter. Soft tissue fullness throughout is consistent with the clinical history of bladder carcinoma. There is irregularity in the pericystic fat, suggesting direct tumor extension. Soft tissue density also extends anteriorly toward the median umbilical ligament (image 93, series 2). This is grossly similar and suspicious for tumor involvement. The fat plane between the seminal vesicles and posterior wall bladder is ill-defined on image 92. Soft tissue fullness in the region of the prostatectomy bed on image 97 may represent direct tumor extension. No pelvic sidewall extension. Stomach/Bowel: Normal stomach, without wall thickening. Normal colon and terminal ileum. Normal small bowel. Vascular/Lymphatic: Aortic and branch vessel atherosclerosis. Non aneurysmal dilatation of the infrarenal aorta 2.5 cm. Similar small retroperitoneal nodes, without abdominal adenopathy. Left external iliac index node measures 10 mm on image 82 and is unchanged. Reproductive: prostatectomy. Soft tissue fullness within the prostatectomy bed is likely due to direct bladder extension. Other: No significant free fluid. Fat containing inguinal hernias are greater on the right. No evidence of omental or peritoneal disease. Musculoskeletal: Trace L3-4 anterolisthesis. Degenerative disc disease at this level. IMPRESSION: 1. Placement of a left  nephrostomy catheter with resolution of left-sided hydronephrosis. Persistent right-sided hydroureteronephrosis, presumably secondary to bladder process. 2. Left distal ureteric stone, as before. 3. Bladder decompressed around a Foley catheter. Likely locally advanced bladder carcinoma with soft tissue fullness extending about the bladder. Similar mild left pelvic adenopathy, suspicious for nodal metastasis. 4. Grossly similar right hepatic lobe low-density lesion, suboptimally evaluated secondary to lack of contrast. 5. Pulmonary artery enlargement suggests pulmonary arterial hypertension. 6.  Atherosclerosis, including within the coronary arteries. Electronically Signed   By: Abigail Miyamoto M.D.   On: 03/14/2015 18:04   Dg Chest Port 1 View  03/14/2015  CLINICAL DATA:  Shortness of breath EXAM: PORTABLE CHEST 1 VIEW COMPARISON:  02/15/2015 chest radiograph. FINDINGS: Stable cardiomediastinal silhouette with mild cardiomegaly. No pneumothorax. Possible stable trace right pleural effusion. No appreciable left pleural effusion. No overt pulmonary edema. Stable elevation of the right hemidiaphragm. Stable right basilar curvilinear opacities, favor atelectasis. No new focal lung opacity. IMPRESSION: 1. Stable mild cardiomegaly without overt pulmonary  edema. 2. Stable elevation of the right hemidiaphragm with likely stable trace right pleural effusion. 3. Stable curvilinear right basal lung opacities, favor atelectasis. No new focal lung opacity. Electronically Signed   By: Ilona Sorrel M.D.   On: 03/14/2015 13:18   I have personally reviewed and evaluated these images and lab results as part of my medical decision-making.   EKG Interpretation   Date/Time:  Sunday March 14 2015 12:08:08 EST Ventricular Rate:  108 PR Interval:  179 QRS Duration: 90 QT Interval:  351 QTC Calculation: 470 R Axis:   58 Text Interpretation:  Sinus tachycardia Atrial premature complexes  Borderline low voltage, extremity  leads Since last tracing now sinus  rhythm Confirmed by Jasma Seevers MD, Taria Castrillo [phone]) on 03/14/2015 12:16:47 PM      EKG Interpretation  Date/Time:  Sunday March 14 2015 12:49:14 EST Ventricular Rate:  162 PR Interval:  179 QRS Duration: 87 QT Interval:  283 QTC Calculation: 465 R Axis:   -9 Text Interpretation:  Atrial flutter with 2 to 1 block Nonspecific T abnormalities, inferior leads Baseline wander in lead(s) V5 Confirmed by Kayle Passarelli MD, Statia Burdick [id number]) on 03/14/2015 1:45:43 PM         MDM   Final diagnoses:  Atrial flutter with rapid ventricular response ([place])    81 y.o. male presents with ongoing shortness of breath worsening since recent discharge from admission 2/2 ARF. Initially appears sinus but during tele observation changes to Aflutter RVR with rate >160. Stabilized with diltiazem bolus and drip for rate control. Troponin elevated but no evidence of acute ischemia on EKG, suspect demand ischemia with paroxysmal flutter and ongoing renal dysfunction. Provided aspirin for ACS coverage although no chest pain currently, will need to trend troponins on an inpatient basis. No evidence of infection. Hospitalist was consulted for admission and will see the patient in the emergency department. Consulted Cardiology and spoke with Dr Derrick Ravel regarding this established patient of Dr Terrence Dupont and he saw the Pt in the ED.     Leo Grosser, MD 03/14/15 [phone]

## 2015-03-15 LAB — URINALYSIS, ROUTINE W REFLEX MICROSCOPIC
Bilirubin Urine: NEGATIVE
GLUCOSE, UA: NEGATIVE mg/dL
KETONES UR: NEGATIVE mg/dL
Nitrite: POSITIVE — AB
PROTEIN: NEGATIVE mg/dL
Specific Gravity, Urine: 1.013 (ref 1.005–1.030)
pH: 6 (ref 5.0–8.0)

## 2015-03-15 LAB — GLUCOSE, CAPILLARY
GLUCOSE-CAPILLARY: 153 mg/dL — AB (ref 65–99)
GLUCOSE-CAPILLARY: 160 mg/dL — AB (ref 65–99)
GLUCOSE-CAPILLARY: 111 mg/dL — AB (ref 65–99)
Glucose-Capillary: 144 mg/dL — ABNORMAL HIGH (ref 65–99)
Glucose-Capillary: 102 mg/dL — ABNORMAL HIGH (ref 65–99)

## 2015-03-15 LAB — CBC
HCT: 38.2 % — ABNORMAL LOW (ref 39.0–52.0)
Hemoglobin: 12.3 g/dL — ABNORMAL LOW (ref 13.0–17.0)
MCH: 29.9 pg (ref 26.0–34.0)
MCHC: 32.2 g/dL (ref 30.0–36.0)
MCV: 92.7 fL (ref 78.0–100.0)
Platelets: 150 K/uL (ref 150–400)
RBC: 4.12 MIL/uL — ABNORMAL LOW (ref 4.22–5.81)
RDW: 14.2 % (ref 11.5–15.5)
WBC: 15.1 K/uL — ABNORMAL HIGH (ref 4.0–10.5)

## 2015-03-15 LAB — HEMOGLOBIN A1C
Hgb A1c MFr Bld: 7.1 % — ABNORMAL HIGH (ref 4.8–5.6)
Mean Plasma Glucose: 157 mg/dL

## 2015-03-15 LAB — URINE MICROSCOPIC-ADD ON

## 2015-03-15 LAB — BASIC METABOLIC PANEL WITH GFR
Anion gap: 9 (ref 5–15)
BUN: 19 mg/dL (ref 6–20)
CO2: 23 mmol/L (ref 22–32)
Calcium: 8.1 mg/dL — ABNORMAL LOW (ref 8.9–10.3)
Chloride: 106 mmol/L (ref 101–111)
Creatinine, Ser: 2.03 mg/dL — ABNORMAL HIGH (ref 0.61–1.24)
GFR calc Af Amer: 33 mL/min — ABNORMAL LOW
GFR calc non Af Amer: 29 mL/min — ABNORMAL LOW
Glucose, Bld: 107 mg/dL — ABNORMAL HIGH (ref 65–99)
Potassium: 4.5 mmol/L (ref 3.5–5.1)
Sodium: 138 mmol/L (ref 135–145)

## 2015-03-15 LAB — TROPONIN I: Troponin I: 0.53 ng/mL (ref <0.031)

## 2015-03-15 LAB — MAGNESIUM: Magnesium: 1.9 mg/dL (ref 1.7–2.4)

## 2015-03-15 MED ORDER — SIMETHICONE 40 MG/0.6ML PO SUSP
40.0000 mg | Freq: Four times a day (QID) | ORAL | Status: DC | PRN
  Administered 2015-03-15 – 2015-03-17 (×3): 40 mg via ORAL
  Filled 2015-03-15 (×4): qty 0.6

## 2015-03-15 MED ORDER — POLYETHYLENE GLYCOL 3350 17 G PO PACK
17.0000 g | PACK | Freq: Every day | ORAL | Status: DC
  Administered 2015-03-15 – 2015-03-18 (×4): 17 g via ORAL
  Filled 2015-03-15 (×7): qty 1

## 2015-03-15 MED ORDER — DOCUSATE SODIUM 100 MG PO CAPS
100.0000 mg | ORAL_CAPSULE | Freq: Two times a day (BID) | ORAL | Status: DC
  Administered 2015-03-15 – 2015-03-20 (×11): 100 mg via ORAL
  Filled 2015-03-15 (×11): qty 1

## 2015-03-15 MED ORDER — MAGNESIUM SULFATE 2 GM/50ML IV SOLN
2.0000 g | Freq: Once | INTRAVENOUS | Status: AC
  Administered 2015-03-15: 2 g via INTRAVENOUS
  Filled 2015-03-15: qty 50

## 2015-03-15 MED ORDER — AMIODARONE HCL 200 MG PO TABS
200.0000 mg | ORAL_TABLET | Freq: Two times a day (BID) | ORAL | Status: DC
  Administered 2015-03-15 – 2015-03-17 (×5): 200 mg via ORAL
  Filled 2015-03-15 (×5): qty 1

## 2015-03-15 NOTE — Care Management Note (Signed)
Case Management Note  Patient Details  Name: Josephus Cruthirds. MRN: [id number] Date of Birth: Feb 10, 1931  Subjective/Objective:                  A.fib with rvr  Action/Plan: will follow for needs   Expected Discharge Date:                  Expected Discharge Plan:  Home/Self Care  In-House Referral:  NA  Discharge planning Services  CM Consult  Post Acute Care Choice:  NA Choice offered to:  NA  DME Arranged:    DME Agency:     HH Arranged:    HH Agency:     Status of Service:  In process, will continue to follow  Medicare Important Message Given:    Date Medicare IM Given:    Medicare IM give by:    Date Additional Medicare IM Given:    Additional Medicare Important Message give by:     If discussed at [place] of Stay Meetings, dates discussed:    Additional Comments:  Leeroy Cha, RN 03/15/2015, 10:09 AM

## 2015-03-15 NOTE — Progress Notes (Signed)
Subjective:  Patient denies any chest pains to his breathing has improved spontaneously converted to sinus rhythm.  Cardiac enzymes Are trending down. Objective:  Vital Signs in the last 24 hours: Temp:  [98.3 F (36.8 C)-99 F (37.2 C)] 98.3 F (36.8 C) (11/21 1200) Pulse Rate:  [67-99] 92 (11/21 1300) Resp:  [15-23] 17 (11/21 1300) BP: (88-142)/(43-80) 98/43 mmHg (11/21 1300) SpO2:  [91 %-98 %] 98 % (11/21 1300) Weight:  [91.9 kg (202 lb 9.6 oz)-93.3 kg (205 lb 11 oz)] 93.3 kg (205 lb 11 oz) (11/21 0352)  Intake/Output from previous day: 11/20 0701 - 11/21 0700 In: 1271.3 [I.V.:971.3; IV Piggyback:300] Out: [id number] [Urine:1225] Intake/Output from this shift: Total I/O In: 836.3 [I.V.:786.3; IV Piggyback:50] Out: 760 [Urine:760]  Physical Exam: Neck: no adenopathy, no carotid bruit, no JVD and supple, symmetrical, trachea midline Lungs: decreased breath sounds at bases Heart: regular rate and rhythm, S1, S2 normal and soft systolic murmur noted Abdomen: soft, non-tender; bowel sounds normal; no masses,  no organomegaly Extremities: extremities normal, atraumatic, no cyanosis or edema Left nephrostomy tube noted with clear urine. Indwelling Foley catheter with gross hematuria Lab Results:  Recent Labs  03/14/15 1220 03/15/15 0334  WBC 13.6* 15.1*  HGB 13.5 12.3*  PLT 125* 150    Recent Labs  03/14/15 1220 03/15/15 0334  NA 136 138  K 4.1 4.5  CL 102 106  CO2 22 23  GLUCOSE 203* 107*  BUN 23* 19  CREATININE 1.94* 2.03*    Recent Labs  03/14/15 2140 03/15/15 0334  TROPONINI 0.72* 0.53*   Hepatic Function Panel No results for input(s): PROT, ALBUMIN, AST, ALT, ALKPHOS, BILITOT, BILIDIR, IBILI in the last 72 hours. No results for input(s): CHOL in the last 72 hours. No results for input(s): PROTIME in the last 72 hours.  Imaging: Imaging results have been reviewed and Ct Abdomen Pelvis Wo Contrast  03/14/2015  CLINICAL DATA:  Acute on chronic renal  failure. Prostatectomy. Hypertension. Uroepithelial cancer diagnosed 2/16. Left nephrostomy tube. Possible urinary tract infection. EXAM: CT ABDOMEN AND PELVIS WITHOUT CONTRAST TECHNIQUE: Multidetector CT imaging of the abdomen and pelvis was performed following the standard protocol without IV contrast. COMPARISON:  Plain films 02/20/2015.  Most recent CT 02/15/2015. FINDINGS: Lower chest: Volume loss with probable scarring at the anterior left lung base. Mild cardiomegaly with LAD coronary artery atherosclerosis. Right pulmonary artery enlargement at 3.5 cm (image 1, series 2). Right hemidiaphragm elevation. Hepatobiliary: Suboptimal evaluation of the hypo attenuating posterior right hepatic lobe lesion secondary lack of IV contrast. Measures on the order of 4.2 cm in anterior posterior dimension today versus similar (when remeasured). No definite new liver lesions identified. Normal gallbladder, without biliary ductal dilatation. Pancreas: Mild pancreatic atrophy, within normal variation for age. No duct dilatation. Spleen: Normal in size, without focal abnormality. Adrenals/Urinary Tract: Normal adrenal glands. Low-density bilateral renal lesions are likely cysts. Bilateral renal cortical thinning. Placement of a left-sided nephrostomy catheter since the prior CT, with resolution of hydronephrosis. Mild right-sided hydroureteronephrosis is similar. Ureteric prominence followed to the level of the urinary bladder. Distal left ureteric stone is approximately 2 cm proximal to the bladder and measures 5 mm (image 86, series 2). Similar in position to on the prior. Bladder decompressed around a Foley catheter. Soft tissue fullness throughout is consistent with the clinical history of bladder carcinoma. There is irregularity in the pericystic fat, suggesting direct tumor extension. Soft tissue density also extends anteriorly toward the median umbilical ligament (image 93, series 2).  This is grossly similar and  suspicious for tumor involvement. The fat plane between the seminal vesicles and posterior wall bladder is ill-defined on image 92. Soft tissue fullness in the region of the prostatectomy bed on image 97 may represent direct tumor extension. No pelvic sidewall extension. Stomach/Bowel: Normal stomach, without wall thickening. Normal colon and terminal ileum. Normal small bowel. Vascular/Lymphatic: Aortic and branch vessel atherosclerosis. Non aneurysmal dilatation of the infrarenal aorta 2.5 cm. Similar small retroperitoneal nodes, without abdominal adenopathy. Left external iliac index node measures 10 mm on image 82 and is unchanged. Reproductive: prostatectomy. Soft tissue fullness within the prostatectomy bed is likely due to direct bladder extension. Other: No significant free fluid. Fat containing inguinal hernias are greater on the right. No evidence of omental or peritoneal disease. Musculoskeletal: Trace L3-4 anterolisthesis. Degenerative disc disease at this level. IMPRESSION: 1. Placement of a left nephrostomy catheter with resolution of left-sided hydronephrosis. Persistent right-sided hydroureteronephrosis, presumably secondary to bladder process. 2. Left distal ureteric stone, as before. 3. Bladder decompressed around a Foley catheter. Likely locally advanced bladder carcinoma with soft tissue fullness extending about the bladder. Similar mild left pelvic adenopathy, suspicious for nodal metastasis. 4. Grossly similar right hepatic lobe low-density lesion, suboptimally evaluated secondary to lack of contrast. 5. Pulmonary artery enlargement suggests pulmonary arterial hypertension. 6.  Atherosclerosis, including within the coronary arteries. Electronically Signed   By: Abigail Miyamoto M.D.   On: 03/14/2015 18:04   Dg Chest Port 1 View  03/14/2015  CLINICAL DATA:  Shortness of breath EXAM: PORTABLE CHEST 1 VIEW COMPARISON:  02/15/2015 chest radiograph. FINDINGS: Stable cardiomediastinal silhouette with  mild cardiomegaly. No pneumothorax. Possible stable trace right pleural effusion. No appreciable left pleural effusion. No overt pulmonary edema. Stable elevation of the right hemidiaphragm. Stable right basilar curvilinear opacities, favor atelectasis. No new focal lung opacity. IMPRESSION: 1. Stable mild cardiomegaly without overt pulmonary edema. 2. Stable elevation of the right hemidiaphragm with likely stable trace right pleural effusion. 3. Stable curvilinear right basal lung opacities, favor atelectasis. No new focal lung opacity. Electronically Signed   By: Ilona Sorrel M.D.   On: 03/14/2015 13:18    Cardiac Studies:  Assessment/Plan:  Status post a flutter with 2 to one block, converted to sinus rhythm. Status post small non-Q-wave myocardial infarction due to elevated troponin I probably secondary to demand ischemia. Hypertension. Metastatic bladder cancer. Status post left ureteral obstruction/left nephrostomy tube. Chronic kidney disease. Rheumatoid arthritis. Benign hypertrophy of prostate History of peptic ulcer disease. Plan Add low-dose amiodarone as per orders. Check serial enzymes and EKG Patient is not a candidate for any cardiac invasive and prevention in view of gross hematuria.  LOS: 1 day    Charolette Forward 03/15/2015, 3:55 PM

## 2015-03-15 NOTE — Progress Notes (Addendum)
[facility] PROGRESS NOTE  Craig Waters. LR:[id number] DOB: 1930-12-15 DOA: 03/14/2015 PCP: [name], MD  Length of stay: 1   Assessment/Plan: Principal Problem:   Atrial fibrillation with RVR ([place]) Active Problems:   HTN (hypertension)   Urothelial carcinoma (HCC)   Rheumatoid arthritis (HCC)   Atrial flutter with rapid ventricular response (HCC)   Acute on chronic renal failure (HCC)    Brief summary Craig Waters. is a 81 y.o. male a PMH of rheumatoid arthritis on chronic prednisone and other immunosuppressive therapy, high-grade urothelial cell carcinoma, under the care of Dr. [name] at [facility], recently admitted to hospital 10-31: for AKI, due to obstructive uropathy, had nephrostomy tube place   Patient presents with increase SOB, palpitation. He relates dyspnea for months but worse for last 2 days. He denies chest pain. He has not had surgery for urothelial cancer done. His urine continue to be bloody. He has left side nephrostomy tube in place. He was started on bactrim on friday for possible UTI, by his PCP.   Evaluation in the ED; He was found to be in A fib RVR. Chest x ray: stable cardiomegaly, mild elevated troponin at 0.50  Assessment and plan 1-A fib RVR; continue with Cardizem Gtt. pending further recommendations from cardiology. Patient converted to normal sinus rhythm this morning D-dimer elevated, VQ scan to rule out PE may not change mx  Venous Doppler to rule out DVT, if positive ,may need IVC  Not a  candidate for anticoagulation/antiplatelet therapy because of hematuria.   2-Mild elevated troponin:  In setting of A fib RVR.  Denies chest pain.  Cardiology consulted.  Patient had a recent 2-D echo on 02/17/15 with an EF of 65-70%, no regional wall motion abnormalities were identified  3-Acute on chronic renal failure: Stage III, baseline 1.5, H/O Urothelia cancer, bilateral hydronephrosis, left side nephrostomy in  place.  Renal function steadily getting worse, multifactorial, patient also dehydrated in the setting of UTI He was on bactrim that could increase BUN,  Left nephrostomy catheter seems to be draining, no hydronephrosis on the left Right-sided hydro-ureter nephrosis, urology following According to Urology no indication for acute intervention during this hospitalization Patient to follow-up with his urologist at [facility]   4-Hyperglycemia; SSI. Check Hb A1c.   5-Recent UTI; leukocytosis; repeat UA, Urine culture. IV ceftriaxone.   6. Metastatic bladder cancer-Known high grade bladder cancer s/p palliative resections at [facility] by Dr. [name].Last seen there 11/3 with planned palliative resection next month. CT 01/2015 with liver lesion and Lt pelvic adenopathy concerning for likely metastatic disease.    DVT prophylaxsis SCDs  Code Status:      Code Status Orders        Start     Ordered   03/14/15 1626  Full code   Continuous     03/14/15 1626     Family Communication: family updated about patient's clinical progress Disposition Plan:  Anticipate discharge when hematuria improves     Consultants:  Neurology  Cardiology    Procedures:  None  Antibiotics: Anti-infectives    Start     Dose/Rate Route Frequency Ordered Stop   03/14/15 1545  cefTRIAXone (ROCEPHIN) 1 g in dextrose 5 % 50 mL IVPB     1 g 100 mL/hr over 30 Minutes Intravenous Every 24 hours 03/14/15 1538           HPI/Subjective: Patient complains of constipation  Objective: Filed Vitals:  03/15/15 0600 03/15/15 0700 03/15/15 0800 03/15/15 0900  BP: 142/66 140/58 136/70 125/67  Pulse: 88 96 98 95  Temp:   99 F (37.2 C)   TempSrc:   Oral   Resp: 19 21 20 18   Height:      Weight:      SpO2: 95% 95% 93% 95%    Intake/Output Summary (Last 24 hours) at 03/15/15 1010 Last data filed at 03/15/15 0843  Gross per 24 hour  Intake 1498.75 ml  Output   1535 ml  Net -36.25 ml     Exam:  General: No acute respiratory distress Lungs: Clear to auscultation bilaterally without wheezes or crackles Cardiovascular: Regular rate and rhythm without murmur gallop or rub normal S1 and S2 Abdomen: Nontender, nondistended, soft, bowel sounds positive, no rebound, no ascites, no appreciable mass Extremities: No significant cyanosis, clubbing, or edema bilateral lower extremities     Data Review   Micro Results No results found for this or any previous visit (from the past 240 hour(s)).  Radiology Reports Ct Abdomen Pelvis Wo Contrast  03/14/2015  CLINICAL DATA:  Acute on chronic renal failure. Prostatectomy. Hypertension. Uroepithelial cancer diagnosed 2/16. Left nephrostomy tube. Possible urinary tract infection. EXAM: CT ABDOMEN AND PELVIS WITHOUT CONTRAST TECHNIQUE: Multidetector CT imaging of the abdomen and pelvis was performed following the standard protocol without IV contrast. COMPARISON:  Plain films 02/20/2015.  Most recent CT 02/15/2015. FINDINGS: Lower chest: Volume loss with probable scarring at the anterior left lung base. Mild cardiomegaly with LAD coronary artery atherosclerosis. Right pulmonary artery enlargement at 3.5 cm (image 1, series 2). Right hemidiaphragm elevation. Hepatobiliary: Suboptimal evaluation of the hypo attenuating posterior right hepatic lobe lesion secondary lack of IV contrast. Measures on the order of 4.2 cm in anterior posterior dimension today versus similar (when remeasured). No definite new liver lesions identified. Normal gallbladder, without biliary ductal dilatation. Pancreas: Mild pancreatic atrophy, within normal variation for age. No duct dilatation. Spleen: Normal in size, without focal abnormality. Adrenals/Urinary Tract: Normal adrenal glands. Low-density bilateral renal lesions are likely cysts. Bilateral renal cortical thinning. Placement of a left-sided nephrostomy catheter since the prior CT, with resolution of  hydronephrosis. Mild right-sided hydroureteronephrosis is similar. Ureteric prominence followed to the level of the urinary bladder. Distal left ureteric stone is approximately 2 cm proximal to the bladder and measures 5 mm (image 86, series 2). Similar in position to on the prior. Bladder decompressed around a Foley catheter. Soft tissue fullness throughout is consistent with the clinical history of bladder carcinoma. There is irregularity in the pericystic fat, suggesting direct tumor extension. Soft tissue density also extends anteriorly toward the median umbilical ligament (image 93, series 2). This is grossly similar and suspicious for tumor involvement. The fat plane between the seminal vesicles and posterior wall bladder is ill-defined on image 92. Soft tissue fullness in the region of the prostatectomy bed on image 97 may represent direct tumor extension. No pelvic sidewall extension. Stomach/Bowel: Normal stomach, without wall thickening. Normal colon and terminal ileum. Normal small bowel. Vascular/Lymphatic: Aortic and branch vessel atherosclerosis. Non aneurysmal dilatation of the infrarenal aorta 2.5 cm. Similar small retroperitoneal nodes, without abdominal adenopathy. Left external iliac index node measures 10 mm on image 82 and is unchanged. Reproductive: prostatectomy. Soft tissue fullness within the prostatectomy bed is likely due to direct bladder extension. Other: No significant free fluid. Fat containing inguinal hernias are greater on the right. No evidence of omental or peritoneal disease. Musculoskeletal: Trace L3-4 anterolisthesis. Degenerative  disc disease at this level. IMPRESSION: 1. Placement of a left nephrostomy catheter with resolution of left-sided hydronephrosis. Persistent right-sided hydroureteronephrosis, presumably secondary to bladder process. 2. Left distal ureteric stone, as before. 3. Bladder decompressed around a Foley catheter. Likely locally advanced bladder carcinoma  with soft tissue fullness extending about the bladder. Similar mild left pelvic adenopathy, suspicious for nodal metastasis. 4. Grossly similar right hepatic lobe low-density lesion, suboptimally evaluated secondary to lack of contrast. 5. Pulmonary artery enlargement suggests pulmonary arterial hypertension. 6.  Atherosclerosis, including within the coronary arteries. Electronically Signed   By: Abigail Miyamoto M.D.   On: 03/14/2015 18:04   Ct Abdomen Pelvis Wo Contrast  02/15/2015  CLINICAL DATA:  81 year old male with abdominal pain and bloating since this morning. Recent dizziness. Acute renal failure. Current history of bladder cancer, gross hematuria. EXAM: CT ABDOMEN AND PELVIS WITHOUT CONTRAST TECHNIQUE: Multidetector CT imaging of the abdomen and pelvis was performed following the standard protocol without IV contrast. COMPARISON:  Renal ultrasound 02/12/2015. Lumbar MRI 07/29/2014. CT Abdomen and Pelvis 08/23/2011. FINDINGS: Chronic elevation of the right hemidiaphragm. Right middle lobe and lower lobe atelectasis with some air bronchograms. Superimposed bibasilar dependent atelectasis. Small volume pleural effusions in both costophrenic angles. No pericardial effusion. Chronic degenerative changes in the spine. Chronic lumbar pars defects associated with L3-L4 anterolisthesis. No acute or suspicious osseous lesion identified. Abnormal bladder wall thickening with Foley catheter in place. Prostate surgery suspected since the 2013 comparison. No pelvic free fluid. Negative rectum. Pelvic sidewall lymphadenopathy greater on the left, up to 10 mm short axis. No inguinal lymphadenopathy. Redundant sigmoid colon with mild diverticulosis. Occasional diverticula in the left colon. Mild diverticula at the hepatic flexure. Negative right colon. Appendix not identified. There is trace right lower quadrant free fluid associated with left greater than right retroperitoneal and lateral Conal fascia stranding. See  renal findings below. Most of the small bowel is decompressed. There are some gas-filled nondilated loops in the ventral abdomen. The stomach is distended with oral contrast. Negative duodenum. Noncontrast gallbladder, spleen, pancreas, and adrenal glands are within normal limits. There is a hypodense posterior right lobe liver mass approximating 5.4 cm diameter (series 2, image 32) Which is subtle in the absence of IV contrast. No other liver lesion identified. This is new since 2013. There is a 5 x 10 mm distal left ureteral stone located 10-15 mm from the left ureterovesical junction. There is associated left hydroureter and left greater than right periureteral stranding with mild left hydronephrosis. Likewise there is mild right hydronephrosis and hydroureter, however, no right ureteral calculus is identified. No nephrolithiasis. Bilateral hypodense renal cysts have increased have mildly increased since 2013. No pneumoperitoneum. No upper abdominal free fluid. Aortoiliac calcified atherosclerosis noted. Ectatic infrarenal abdominal aorta measuring up to 27 mm diameter is stable since 2013. IMPRESSION: 1. 5 cm hypodense right lobe liver mass is new since 2013 and highly suspicious for metastatic disease in this setting. 2. Bladder wall thickening. Bilateral hydronephrosis and hydroureter suggesting obstructive uropathy. On the left there is a distal ureteral calculus measuring 5 x 10 mm located about 1 cm from the left UVJ. No right side urologic calculus, consider obstruction due to bladder tumor. 3. Left greater than right pelvic sidewall lymphadenopathy, up to 10 mm short axis. 4. Trace pleural effusions. Right greater than left lung base atelectasis. Electronically Signed   By: Genevie Ann M.D.   On: 02/15/2015 16:51   Dg Chest 2 View  02/15/2015  CLINICAL DATA:  Increased weakness over the past few days. Worsening cough. Chest congestion. EXAM: CHEST  2 VIEW COMPARISON:  02/12/2015. FINDINGS: Poor  inspiration. Enlarged cardiac silhouette with a mild increase in size. Chronically elevated right hemidiaphragm. Clear lungs. Small bilateral pleural effusions. Upper lumbar spine degenerative changes. IMPRESSION: 1. Small bilateral pleural effusions. 2. Mildly progressive cardiomegaly. 3. Stable chronically elevated right hemidiaphragm. Electronically Signed   By: Claudie Revering M.D.   On: 02/15/2015 10:48   Ir Fluoro Guide Cv Line Right  02/16/2015  CLINICAL DATA:  81 year old male admitted with acute renal failure. He has been referred for evaluation for hemodialysis catheter placement. Catheter placement is indicated. EXAM: IR RIGHT FLOURO GUIDE CV LINE; IR ULTRASOUND GUIDANCE VASC ACCESS RIGHT Date: 02/16/2015 ANESTHESIA/SEDATION: Moderate (conscious) sedation was administered during this procedure. A total of 0.5 mg Versed and 25 mg Fentanyl were administered intravenously. The patient's vital signs were monitored continuously by radiology nursing throughout the course of the procedure. Total sedation time: 11 minutes FLUOROSCOPY TIME:  18 seconds TECHNIQUE: The procedure, risks, benefits, and alternatives were explained to the patient. Questions regarding the procedure were encouraged and answered. The patient understands and consents to the procedure. The right neck and chest was prepped with chlorhexidine, and draped in the usual sterile fashion using maximum barrier technique (cap and mask, sterile gown, sterile gloves, large sterile sheet, hand hygiene and cutaneous antiseptic). Local anesthesia was attained by infiltration with 1% lidocaine with epinephrine. Ultrasound demonstrated patency of the right internal jugular vein, and this was documented with an image. Under real-time ultrasound guidance, this vein was accessed with a 21 gauge micropuncture needle and image documentation was performed. A small dermatotomy was made at the access site with an 11 scalpel. A 0.018" wire was advanced into the SVC  and the access needle exchanged for a 41F micropuncture vascular sheath. The 0.018" wire was then removed and a 0.035" wire advanced into the IVC. Dilation of the tissue tract was performed with a 10 French tissue dilator. A 20 cm triple-lumen trialysis catheter was then placed using Seldinger technique, with the tip terminating at the superior cavoatrial junction. Wire was removed.  The catheter was sutured in position. Patient tolerated the procedure well and remained hemodynamically stable throughout. No complications were encountered and no significant blood loss was encountered. COMPLICATIONS: None IMPRESSION: Status post placement of right IJ temporary hemodialysis catheter. Catheter ready for use. Signed, Dulcy Fanny. Earleen Newport, DO [facility] Electronically Signed   By: Corrie Mckusick D.O.   On: 02/16/2015 13:16   Ir US Guide Vasc Access Right  02/16/2015  CLINICAL DATA:  81 year old male admitted with acute renal failure. He has been referred for evaluation for hemodialysis catheter placement. Catheter placement is indicated. EXAM: IR RIGHT FLOURO GUIDE CV LINE; IR ULTRASOUND GUIDANCE VASC ACCESS RIGHT Date: 02/16/2015 ANESTHESIA/SEDATION: Moderate (conscious) sedation was administered during this procedure. A total of 0.5 mg Versed and 25 mg Fentanyl were administered intravenously. The patient's vital signs were monitored continuously by radiology nursing throughout the course of the procedure. Total sedation time: 11 minutes FLUOROSCOPY TIME:  18 seconds TECHNIQUE: The procedure, risks, benefits, and alternatives were explained to the patient. Questions regarding the procedure were encouraged and answered. The patient understands and consents to the procedure. The right neck and chest was prepped with chlorhexidine, and draped in the usual sterile fashion using maximum barrier technique (cap and mask, sterile gown, sterile gloves, large sterile sheet,  hand hygiene and cutaneous antiseptic).  Local anesthesia was attained by infiltration with 1% lidocaine with epinephrine. Ultrasound demonstrated patency of the right internal jugular vein, and this was documented with an image. Under real-time ultrasound guidance, this vein was accessed with a 21 gauge micropuncture needle and image documentation was performed. A small dermatotomy was made at the access site with an 11 scalpel. A 0.018" wire was advanced into the SVC and the access needle exchanged for a 66F micropuncture vascular sheath. The 0.018" wire was then removed and a 0.035" wire advanced into the IVC. Dilation of the tissue tract was performed with a 10 French tissue dilator. A 20 cm triple-lumen trialysis catheter was then placed using Seldinger technique, with the tip terminating at the superior cavoatrial junction. Wire was removed.  The catheter was sutured in position. Patient tolerated the procedure well and remained hemodynamically stable throughout. No complications were encountered and no significant blood loss was encountered. COMPLICATIONS: None IMPRESSION: Status post placement of right IJ temporary hemodialysis catheter. Catheter ready for use. Signed, Dulcy Fanny. Earleen Newport, DO [facility] Electronically Signed   By: Corrie Mckusick D.O.   On: 02/16/2015 13:16   Dg Chest Port 1 View  03/14/2015  CLINICAL DATA:  Shortness of breath EXAM: PORTABLE CHEST 1 VIEW COMPARISON:  02/15/2015 chest radiograph. FINDINGS: Stable cardiomediastinal silhouette with mild cardiomegaly. No pneumothorax. Possible stable trace right pleural effusion. No appreciable left pleural effusion. No overt pulmonary edema. Stable elevation of the right hemidiaphragm. Stable right basilar curvilinear opacities, favor atelectasis. No new focal lung opacity. IMPRESSION: 1. Stable mild cardiomegaly without overt pulmonary edema. 2. Stable elevation of the right  hemidiaphragm with likely stable trace right pleural effusion. 3. Stable curvilinear right basal lung opacities, favor atelectasis. No new focal lung opacity. Electronically Signed   By: Ilona Sorrel M.D.   On: 03/14/2015 13:18   Dg Abd 2 Views  02/20/2015  CLINICAL DATA:  Abdominal pain, follow-up ileus, status post nephrectomy EXAM: ABDOMEN - 2 VIEW COMPARISON:  02/19/2015 FINDINGS: Mild gaseous distention of small and large bowel, compatible with history of postoperative adynamic ileus. No evidence of bowel obstruction. Left percutaneous nephrostomy. Degenerative changes the lumbar spine. IMPRESSION: No evidence of bowel obstruction. Mild gaseous distention of small and large bowel, compatible with history of postoperative adynamic ileus. Electronically Signed   By: Julian Hy M.D.   On: 02/20/2015 12:28   Dg Abd Portable 1v  02/19/2015  CLINICAL DATA:  Abdominal pain, nausea, constipation EXAM: PORTABLE ABDOMEN - 1 VIEW COMPARISON:  None. FINDINGS: Left nephrostomy catheter is noted. Gaseous distended small bowel loops on mid abdomen highly suspicious for significant ileus or partial small bowel obstruction. Stool and gas noted in distal colon. IMPRESSION: Gaseous distended small bowel loops mid abdomen highly suspicious for significant ileus or bowel obstruction. Electronically Signed   By: Lahoma Crocker M.D.   On: 02/19/2015 14:10   Ir Nephrostomy Placement Left  02/16/2015  CLINICAL DATA:  81 year old male admitted with acute renal failure. Patient never has had dialysis. He has evidence on imaging of mild pelvicaliectasis with no frank hydronephrosis. CT demonstrates a distal left ureteral stone and minimal ureteral dilation. He has been referred for evaluation for urinary diversion, as a component of acute renal failure may be related to an obstruction. Drain catheter placement is indicated. EXAM: IR NEPHROSTOMY PLACEMENT LEFT COMPARISON:  None. ANESTHESIA/SEDATION: 2.0 IV Versed; 25 mcg IV  Fentanyl. Total Moderate Sedation Time: 72 minutes. CONTRAST:  35mL OMNIPAQUE IOHEXOL 300 MG/ML  SOLN MEDICATIONS: 400 mg IV Cipro FLUOROSCOPY TIME:  12 minutes, 36 seconds TECHNIQUE: The procedure, risks, benefits, and alternatives were explained to the patient and the patient's family. Questions regarding the procedure were encouraged and answered. The patient understands and consents to the procedure. The bilateral flanks were prepped with chlorhexidine in a sterile fashion, and a sterile drape was applied covering the operative field. A sterile gown and sterile gloves were used for the procedure. Local anesthesia was provided with 1% Lidocaine. Ultrasound survey of both the left and right kidney was performed with images stored and sent to PACs. Left: Left kidney was then identified, without significant hydronephrosis. Ultrasound guidance was used to direct a 22 gauge coaxial needle into the interpolar region of the kidney. Once the tip of the needle was withdrawn into the collecting system, this access was used to opacify the renal collecting system with contrast. A double stick technique was then used after opacifying the collecting system to select a lower pole posterior calyx for a second stick. A 22 gauge coaxial needle was then directed under fluoroscopy into this lower pole posterior calyx. A Nitrex wire was navigated through the infundibulum and collecting system into the ureter, and then using modified Seldinger technique, an Accustick system, and a 10 [place] dilator, a 10 French pigtail drainage catheter was placed into the renal pelvis. Near the conclusion of successful placement of the left-sided percutaneous nephrostomy tube, the patient was becoming restless and agitated. Further attempt at a right-sided percutaneous nephrostomy tube was deemed unsafe. Patient remained hemodynamically stable throughout. No complications encountered. COMPLICATIONS: None FINDINGS: Ultrasound survey of the left kidney  demonstrates minimal pelvicaliectasis. Ultrasound survey of the right kidney demonstrates no hydronephrosis, and no significant pelvicaliectasis. Images during the case demonstrate placement of left-sided percutaneous nephrostomy tube into a posterior inferior calyx. No significant hydronephrosis with infusion of contrast. IMPRESSION: Status post placement of left-sided percutaneous nephrostomy tube. Signed, Dulcy Fanny. Earleen Newport, DO [facility] PLAN: Given that the patient's agitation and discomfort precluded safe attempt at placement of a right-sided percutaneous nephrostomy tube, further attempts may be reconsidered after interval medical therapy. Observation of urine clearance from the left percutaneous nephrostomy tube. Electronically Signed   By: Corrie Mckusick D.O.   On: 02/16/2015 13:29     CBC  Recent Labs Lab 03/14/15 1220 03/15/15 0334  WBC 13.6* 15.1*  HGB 13.5 12.3*  HCT 41.9 38.2*  PLT 125* 150  MCV 92.3 92.7  MCH 29.7 29.9  MCHC 32.2 32.2  RDW 14.2 14.2  LYMPHSABS 1.3  --   MONOABS 0.9  --   EOSABS 0.1  --   BASOSABS 0.0  --     Chemistries   Recent Labs Lab 03/14/15 1220 03/15/15 0334  NA 136 138  K 4.1 4.5  CL 102 106  CO2 22 23  GLUCOSE 203* 107*  BUN 23* 19  CREATININE 1.94* 2.03*  CALCIUM 8.2* 8.1*  MG  --  1.9   ------------------------------------------------------------------------------------------------------------------ estimated creatinine clearance is 32.7 mL/min (by C-G formula based on Cr of 2.03). ------------------------------------------------------------------------------------------------------------------ No results for input(s): HGBA1C in the last 72 hours. ------------------------------------------------------------------------------------------------------------------ No results for input(s): CHOL, HDL, LDLCALC, TRIG, CHOLHDL, LDLDIRECT in the last 72  hours. ------------------------------------------------------------------------------------------------------------------ No results for input(s): TSH, T4TOTAL, T3FREE, THYROIDAB in the last 72 hours.  Invalid input(s): FREET3 ------------------------------------------------------------------------------------------------------------------ No results for input(s): VITAMINB12, FOLATE, FERRITIN, TIBC, IRON, RETICCTPCT in the last 72 hours.  Coagulation profile No results for input(s): INR, PROTIME  in the last 168 hours.   Recent Labs  03/14/15 1220  DDIMER >20.00*    Cardiac Enzymes  Recent Labs Lab 03/14/15 1558 03/14/15 2140 03/15/15 0334  TROPONINI 1.05* 0.72* 0.53*   ------------------------------------------------------------------------------------------------------------------ Invalid input(s): POCBNP   CBG:  Recent Labs Lab 03/14/15 1819 03/14/15 2147 03/15/15 0717  GLUCAP 170* 144* 102*       Studies: Ct Abdomen Pelvis Wo Contrast  03/14/2015  CLINICAL DATA:  Acute on chronic renal failure. Prostatectomy. Hypertension. Uroepithelial cancer diagnosed 2/16. Left nephrostomy tube. Possible urinary tract infection. EXAM: CT ABDOMEN AND PELVIS WITHOUT CONTRAST TECHNIQUE: Multidetector CT imaging of the abdomen and pelvis was performed following the standard protocol without IV contrast. COMPARISON:  Plain films 02/20/2015.  Most recent CT 02/15/2015. FINDINGS: Lower chest: Volume loss with probable scarring at the anterior left lung base. Mild cardiomegaly with LAD coronary artery atherosclerosis. Right pulmonary artery enlargement at 3.5 cm (image 1, series 2). Right hemidiaphragm elevation. Hepatobiliary: Suboptimal evaluation of the hypo attenuating posterior right hepatic lobe lesion secondary lack of IV contrast. Measures on the order of 4.2 cm in anterior posterior dimension today versus similar (when remeasured). No definite new liver lesions identified. Normal  gallbladder, without biliary ductal dilatation. Pancreas: Mild pancreatic atrophy, within normal variation for age. No duct dilatation. Spleen: Normal in size, without focal abnormality. Adrenals/Urinary Tract: Normal adrenal glands. Low-density bilateral renal lesions are likely cysts. Bilateral renal cortical thinning. Placement of a left-sided nephrostomy catheter since the prior CT, with resolution of hydronephrosis. Mild right-sided hydroureteronephrosis is similar. Ureteric prominence followed to the level of the urinary bladder. Distal left ureteric stone is approximately 2 cm proximal to the bladder and measures 5 mm (image 86, series 2). Similar in position to on the prior. Bladder decompressed around a Foley catheter. Soft tissue fullness throughout is consistent with the clinical history of bladder carcinoma. There is irregularity in the pericystic fat, suggesting direct tumor extension. Soft tissue density also extends anteriorly toward the median umbilical ligament (image 93, series 2). This is grossly similar and suspicious for tumor involvement. The fat plane between the seminal vesicles and posterior wall bladder is ill-defined on image 92. Soft tissue fullness in the region of the prostatectomy bed on image 97 may represent direct tumor extension. No pelvic sidewall extension. Stomach/Bowel: Normal stomach, without wall thickening. Normal colon and terminal ileum. Normal small bowel. Vascular/Lymphatic: Aortic and branch vessel atherosclerosis. Non aneurysmal dilatation of the infrarenal aorta 2.5 cm. Similar small retroperitoneal nodes, without abdominal adenopathy. Left external iliac index node measures 10 mm on image 82 and is unchanged. Reproductive: prostatectomy. Soft tissue fullness within the prostatectomy bed is likely due to direct bladder extension. Other: No significant free fluid. Fat containing inguinal hernias are greater on the right. No evidence of omental or peritoneal disease.  Musculoskeletal: Trace L3-4 anterolisthesis. Degenerative disc disease at this level. IMPRESSION: 1. Placement of a left nephrostomy catheter with resolution of left-sided hydronephrosis. Persistent right-sided hydroureteronephrosis, presumably secondary to bladder process. 2. Left distal ureteric stone, as before. 3. Bladder decompressed around a Foley catheter. Likely locally advanced bladder carcinoma with soft tissue fullness extending about the bladder. Similar mild left pelvic adenopathy, suspicious for nodal metastasis. 4. Grossly similar right hepatic lobe low-density lesion, suboptimally evaluated secondary to lack of contrast. 5. Pulmonary artery enlargement suggests pulmonary arterial hypertension. 6.  Atherosclerosis, including within the coronary arteries. Electronically Signed   By: Abigail Miyamoto M.D.   On: 03/14/2015 18:04   Dg Chest Port 1  View  03/14/2015  CLINICAL DATA:  Shortness of breath EXAM: PORTABLE CHEST 1 VIEW COMPARISON:  02/15/2015 chest radiograph. FINDINGS: Stable cardiomediastinal silhouette with mild cardiomegaly. No pneumothorax. Possible stable trace right pleural effusion. No appreciable left pleural effusion. No overt pulmonary edema. Stable elevation of the right hemidiaphragm. Stable right basilar curvilinear opacities, favor atelectasis. No new focal lung opacity. IMPRESSION: 1. Stable mild cardiomegaly without overt pulmonary edema. 2. Stable elevation of the right hemidiaphragm with likely stable trace right pleural effusion. 3. Stable curvilinear right basal lung opacities, favor atelectasis. No new focal lung opacity. Electronically Signed   By: Ilona Sorrel M.D.   On: 03/14/2015 13:18      Lab Results  Component Value Date   HGBA1C 6.5* 05/15/2014   Lab Results  Component Value Date   LDLCALC 117* 10/24/2010   CREATININE 2.03* 03/15/2015       Scheduled Meds: . cefTRIAXone (ROCEPHIN)  IV  1 g Intravenous Q24H  . docusate sodium  100 mg Oral BID  .  insulin aspart  0-9 Units Subcutaneous TID WC  . multivitamin with minerals  1 tablet Oral Daily  . polyethylene glycol  17 g Oral Daily  . predniSONE  7.5 mg Oral Q breakfast  . senna  1 tablet Oral Daily  . sodium chloride  3 mL Intravenous Q12H  . sodium chloride  3 mL Intravenous Q12H   Continuous Infusions: . sodium chloride 100 mL/hr at 03/15/15 0851  . diltiazem (CARDIZEM) infusion 5 mg/hr (03/15/15 0542)    Principal Problem:   Atrial fibrillation with RVR (HCC) Active Problems:   HTN (hypertension)   Urothelial carcinoma (HCC)   Rheumatoid arthritis (HCC)   Atrial flutter with rapid ventricular response ([place])   Acute on chronic renal failure ([place])    Time spent: 45 minutes   [facility] Pager [phone]. If 7PM-7AM, please contact night-coverage at [email or web address], password [facility] 03/15/2015, 10:10 AM  LOS: 1 day

## 2015-03-16 ENCOUNTER — Inpatient Hospital Stay (HOSPITAL_COMMUNITY): Payer: Medicare Other

## 2015-03-16 ENCOUNTER — Inpatient Hospital Stay: Payer: Medicare Other

## 2015-03-16 DIAGNOSIS — M7989 Other specified soft tissue disorders: Secondary | ICD-10-CM

## 2015-03-16 LAB — COMPREHENSIVE METABOLIC PANEL
ALT: 20 U/L (ref 17–63)
AST: 16 U/L (ref 15–41)
Albumin: 2.4 g/dL — ABNORMAL LOW (ref 3.5–5.0)
Alkaline Phosphatase: 96 U/L (ref 38–126)
Anion gap: 8 (ref 5–15)
BUN: 24 mg/dL — AB (ref 6–20)
CHLORIDE: 107 mmol/L (ref 101–111)
CO2: 25 mmol/L (ref 22–32)
CREATININE: 2.11 mg/dL — AB (ref 0.61–1.24)
Calcium: 8 mg/dL — ABNORMAL LOW (ref 8.9–10.3)
GFR calc Af Amer: 32 mL/min — ABNORMAL LOW (ref >60)
GFR calc non Af Amer: 28 mL/min — ABNORMAL LOW (ref >60)
Glucose, Bld: 97 mg/dL (ref 65–99)
Potassium: 4.5 mmol/L (ref 3.5–5.1)
SODIUM: 140 mmol/L (ref 135–145)
Total Bilirubin: 0.6 mg/dL (ref 0.3–1.2)
Total Protein: 5.3 g/dL — ABNORMAL LOW (ref 6.5–8.1)

## 2015-03-16 LAB — CBC
HCT: 35.6 % — ABNORMAL LOW (ref 39.0–52.0)
Hemoglobin: 11.3 g/dL — ABNORMAL LOW (ref 13.0–17.0)
MCH: 29.7 pg (ref 26.0–34.0)
MCHC: 31.7 g/dL (ref 30.0–36.0)
MCV: 93.4 fL (ref 78.0–100.0)
PLATELETS: 137 10*3/uL — AB (ref 150–400)
RBC: 3.81 MIL/uL — AB (ref 4.22–5.81)
RDW: 14.4 % (ref 11.5–15.5)
WBC: 12.9 10*3/uL — AB (ref 4.0–10.5)

## 2015-03-16 LAB — TROPONIN I: Troponin I: 0.25 ng/mL — ABNORMAL HIGH (ref <0.031)

## 2015-03-16 LAB — GLUCOSE, CAPILLARY
GLUCOSE-CAPILLARY: 98 mg/dL (ref 65–99)
GLUCOSE-CAPILLARY: 162 mg/dL — AB (ref 65–99)
GLUCOSE-CAPILLARY: 81 mg/dL (ref 65–99)
Glucose-Capillary: 147 mg/dL — ABNORMAL HIGH (ref 65–99)

## 2015-03-16 LAB — URINE CULTURE

## 2015-03-16 MED ORDER — LIDOCAINE HCL 1 % IJ SOLN
INTRAMUSCULAR | Status: AC
  Filled 2015-03-16: qty 20

## 2015-03-16 MED ORDER — SODIUM CHLORIDE 0.9 % IV SOLN
INTRAVENOUS | Status: AC
  Administered 2015-03-16: 13:00:00 via INTRAVENOUS

## 2015-03-16 MED ORDER — DILTIAZEM HCL 60 MG PO TABS
60.0000 mg | ORAL_TABLET | Freq: Three times a day (TID) | ORAL | Status: DC
  Administered 2015-03-16 – 2015-03-17 (×4): 60 mg via ORAL
  Filled 2015-03-16 (×4): qty 1

## 2015-03-16 MED ORDER — BISACODYL 10 MG RE SUPP
10.0000 mg | Freq: Once | RECTAL | Status: AC
  Administered 2015-03-16: 10 mg via RECTAL
  Filled 2015-03-16: qty 1

## 2015-03-16 NOTE — Procedures (Signed)
Interventional Radiology Procedure Note  Procedure: Placement of a retrievable IVC filter, [place], right IJ approach.     Complications: None Recommendations:  - Ok to shower tomorrow - Do not submerge for 7 days - Routine care - may refer for IVC filter retrieval if patient can be anti-coagulated.    Signed,  Dulcy Fanny. Earleen Newport, DO

## 2015-03-16 NOTE — Progress Notes (Signed)
Inpatient Diabetes Program Recommendations  AACE/ADA: New Consensus Statement on Inpatient Glycemic Control (2015)  Target Ranges:  Prepandial:   less than 140 mg/dL      Peak postprandial:   less than 180 mg/dL (1-2 hours)      Critically ill patients:  140 - 180 mg/dL   Results for JOHNTAVIOUS, BRICKLER (MRN [id number]) as of 03/16/2015 08:09  Ref. Range 03/15/2015 07:17 03/15/2015 12:29 03/15/2015 16:26 03/15/2015 21:25  Glucose-Capillary Latest Ref Range: 65-99 mg/dL 102 (H) 153 (H) 160 (H) 111 (H)    Results for FREDRIC, PRIMAS (MRN [id number]) as of 03/16/2015 08:09  Ref. Range 03/14/2015 12:32  Hemoglobin A1C Latest Ref Range: 4.8-5.6 % 7.1 (H)    Admit with: A Fib with RVR  History: High Grade Urothelial Cell Carcinoma, Rheumatoid Arthritis (takes chronic Prednisone at home in addition to other immunosuppressive therapy)  Current Insulin Orders: Novolog Sensitive SSI (0-9 units) TID AC     MD- Note patient takes chronic Prednisone at home for RA.  No History of DM noted in H&P.  Note that A1c was 7.1% this admission.  Not sure if this is truly a new diagnosis of DM??  Is this likely steroid induced from the continuous doses of Prednisone patient takes at home??    CBGs well controlled thus far.  Requiring minimal Novolog SSI.    --Will follow patient during hospitalization--  Wyn Quaker RN, MSN, CDE Diabetes Coordinator Inpatient Glycemic Control Team Team Pager: [phone] (8a-5p)

## 2015-03-16 NOTE — Progress Notes (Signed)
Patient ID: Craig Golon., male   DOB: Sep 05, 1930, 81 y.o.   MRN: [id number]    Referring Physician(s): [place]  Chief Complaint:  Bladder cancer, bilat LE DVT, hematuria  Subjective: Patient familiar to IR service from prior left percutaneous nephrostomy and central venous line placement on 02/16/15.Past medical history significant for atrial fibrillation, hypertension, bladder cancer, rheumatoid arthritis, left ureteral obstruction secondary to stone and acute on chronic renal failure. Patient now with findings of bilateral lower extremity DVT on venous doppler. He is not a candidate for anticoagulation/ antiplatelet therapy because of hematuria and request is now made for IVC filter placement. Pt primary c/o now are constipation and suprapubic tenderness.    Allergies: Review of patient's allergies indicates no known allergies.  Medications: Prior to Admission medications   Medication Sig Start Date End Date Taking? Authorizing Provider  acetaminophen (TYLENOL) 650 MG CR tablet Take 1,300 mg by mouth every 6 (six) hours as needed for pain.   Yes Historical Provider, MD  acetaminophen-codeine (TYLENOL #3) 300-30 MG tablet Take 1 tablet by mouth every 4 (four) hours as needed for moderate pain.   Yes Historical Provider, MD  b complex vitamins tablet Take 1 tablet by mouth daily.   Yes Historical Provider, MD  Calcium Citrate-Vitamin D (CALCIUM + D PO) Take 1 tablet by mouth daily.   Yes Historical Provider, MD  diltiazem (TIAZAC) 180 MG 24 hr capsule Take 180 mg by mouth daily.   Yes Historical Provider, MD  hyoscyamine (LEVSIN SL) 0.125 MG SL tablet Take 0.125 mg by mouth every 4 (four) hours as needed (bladder spasms).  02/01/15  Yes Historical Provider, MD  influenza vac recombinant HA trivalent (FLUBLOK) injection Inject 0.5 mLs into the muscle once.   Yes Historical Provider, MD  Multiple Vitamin (MULTIVITAMIN WITH MINERALS) TABS tablet Take 1 tablet by mouth daily. 05/15/14  Yes  Estela Leonie Green, MD  ondansetron (ZOFRAN) 4 MG tablet Take 1 tablet (4 mg total) by mouth every 6 (six) hours as needed for nausea. 02/22/15  Yes Donne Hazel, MD  oxyCODONE-acetaminophen (PERCOCET/ROXICET) 5-325 MG tablet Take 1 tablet by mouth every 4 (four) hours as needed for moderate pain. 02/22/15  Yes Donne Hazel, MD  polyethylene glycol powder (GLYCOLAX/MIRALAX) powder Take 255 g by mouth daily. Patient taking differently: Take 17 g by mouth daily.  11/27/14  Yes Willia Craze, NP  predniSONE (DELTASONE) 5 MG tablet Take 7.5 mg by mouth daily with breakfast. .   Yes Historical Provider, MD  senna (SENOKOT) 8.6 MG tablet Take 1 tablet by mouth daily.   Yes Historical Provider, MD  sulfamethoxazole-trimethoprim (BACTRIM DS,SEPTRA DS) 800-160 MG tablet Take 1 tablet by mouth 2 (two) times daily. Started 11/16 for 10 days 03/10/15  Yes Historical Provider, MD  traMADol (ULTRAM) 50 MG tablet Take 50 mg by mouth every 6 (six) hours as needed for moderate pain.   Yes Historical Provider, MD     Vital Signs: BP 139/71 mmHg  Pulse 94  Temp(Src) 98.6 F (37 C) (Oral)  Resp 19  Ht 5\' 11"  (1.803 m)  Wt 206 lb 2.1 oz (93.5 kg)  BMI 28.76 kg/m2  SpO2 95%  Physical Exam pt awake/alert; chest with sl dim BS rt base, left clear; heart- RRR; abd- soft,+BS, mild suprapubic tenderness to palpation; ext- 1+ bilat LE edema; left PCN intact with some erythema at insertion site, draining yellow urine; foley with dark bloody urine  Imaging: Ct Abdomen Pelvis Wo Contrast  03/14/2015  CLINICAL DATA:  Acute on chronic renal failure. Prostatectomy. Hypertension. Uroepithelial cancer diagnosed 2/16. Left nephrostomy tube. Possible urinary tract infection. EXAM: CT ABDOMEN AND PELVIS WITHOUT CONTRAST TECHNIQUE: Multidetector CT imaging of the abdomen and pelvis was performed following the standard protocol without IV contrast. COMPARISON:  Plain films 02/20/2015.  Most recent CT 02/15/2015.  FINDINGS: Lower chest: Volume loss with probable scarring at the anterior left lung base. Mild cardiomegaly with LAD coronary artery atherosclerosis. Right pulmonary artery enlargement at 3.5 cm (image 1, series 2). Right hemidiaphragm elevation. Hepatobiliary: Suboptimal evaluation of the hypo attenuating posterior right hepatic lobe lesion secondary lack of IV contrast. Measures on the order of 4.2 cm in anterior posterior dimension today versus similar (when remeasured). No definite new liver lesions identified. Normal gallbladder, without biliary ductal dilatation. Pancreas: Mild pancreatic atrophy, within normal variation for age. No duct dilatation. Spleen: Normal in size, without focal abnormality. Adrenals/Urinary Tract: Normal adrenal glands. Low-density bilateral renal lesions are likely cysts. Bilateral renal cortical thinning. Placement of a left-sided nephrostomy catheter since the prior CT, with resolution of hydronephrosis. Mild right-sided hydroureteronephrosis is similar. Ureteric prominence followed to the level of the urinary bladder. Distal left ureteric stone is approximately 2 cm proximal to the bladder and measures 5 mm (image 86, series 2). Similar in position to on the prior. Bladder decompressed around a Foley catheter. Soft tissue fullness throughout is consistent with the clinical history of bladder carcinoma. There is irregularity in the pericystic fat, suggesting direct tumor extension. Soft tissue density also extends anteriorly toward the median umbilical ligament (image 93, series 2). This is grossly similar and suspicious for tumor involvement. The fat plane between the seminal vesicles and posterior wall bladder is ill-defined on image 92. Soft tissue fullness in the region of the prostatectomy bed on image 97 may represent direct tumor extension. No pelvic sidewall extension. Stomach/Bowel: Normal stomach, without wall thickening. Normal colon and terminal ileum. Normal small  bowel. Vascular/Lymphatic: Aortic and branch vessel atherosclerosis. Non aneurysmal dilatation of the infrarenal aorta 2.5 cm. Similar small retroperitoneal nodes, without abdominal adenopathy. Left external iliac index node measures 10 mm on image 82 and is unchanged. Reproductive: prostatectomy. Soft tissue fullness within the prostatectomy bed is likely due to direct bladder extension. Other: No significant free fluid. Fat containing inguinal hernias are greater on the right. No evidence of omental or peritoneal disease. Musculoskeletal: Trace L3-4 anterolisthesis. Degenerative disc disease at this level. IMPRESSION: 1. Placement of a left nephrostomy catheter with resolution of left-sided hydronephrosis. Persistent right-sided hydroureteronephrosis, presumably secondary to bladder process. 2. Left distal ureteric stone, as before. 3. Bladder decompressed around a Foley catheter. Likely locally advanced bladder carcinoma with soft tissue fullness extending about the bladder. Similar mild left pelvic adenopathy, suspicious for nodal metastasis. 4. Grossly similar right hepatic lobe low-density lesion, suboptimally evaluated secondary to lack of contrast. 5. Pulmonary artery enlargement suggests pulmonary arterial hypertension. 6.  Atherosclerosis, including within the coronary arteries. Electronically Signed   By: Abigail Miyamoto M.D.   On: 03/14/2015 18:04   Dg Chest Port 1 View  03/14/2015  CLINICAL DATA:  Shortness of breath EXAM: PORTABLE CHEST 1 VIEW COMPARISON:  02/15/2015 chest radiograph. FINDINGS: Stable cardiomediastinal silhouette with mild cardiomegaly. No pneumothorax. Possible stable trace right pleural effusion. No appreciable left pleural effusion. No overt pulmonary edema. Stable elevation of the right hemidiaphragm. Stable right basilar curvilinear opacities, favor atelectasis. No new focal lung opacity. IMPRESSION: 1. Stable mild cardiomegaly without overt pulmonary edema. 2.  Stable elevation  of the right hemidiaphragm with likely stable trace right pleural effusion. 3. Stable curvilinear right basal lung opacities, favor atelectasis. No new focal lung opacity. Electronically Signed   By: Ilona Sorrel M.D.   On: 03/14/2015 13:18    Labs:  CBC:  Recent Labs  02/20/15 0355 03/14/15 1220 03/15/15 0334 03/16/15 0334  WBC 10.5 13.6* 15.1* 12.9*  HGB 11.4* 13.5 12.3* 11.3*  HCT 35.4* 41.9 38.2* 35.6*  PLT 130* 125* 150 137*    COAGS:  Recent Labs  02/16/15 0735  INR 1.12    BMP:  Recent Labs  02/22/15 0337 03/14/15 1220 03/15/15 0334 03/16/15 0334  NA 141 136 138 140  K 3.7 4.1 4.5 4.5  CL 112* 102 106 107  CO2 23 22 23 25   GLUCOSE 161* 203* 107* 97  BUN 15 23* 19 24*  CALCIUM 8.3* 8.2* 8.1* 8.0*  CREATININE 1.37* 1.94* 2.03* 2.11*  GFRNONAA 46* 30* 29* 28*  GFRAA 54* 35* 33* 32*    LIVER FUNCTION TESTS:  Recent Labs  05/14/14 2110  02/17/15 0225 02/17/15 0645 02/18/15 0350 03/16/15 0334  BILITOT 1.0  --   --   --   --  0.6  AST 45*  --   --   --   --  16  ALT 41  --   --   --   --  20  ALKPHOS 92  --   --   --   --  96  PROT 6.3  --   --   --   --  5.3*  ALBUMIN 2.3*  < > 2.6* 2.4* 2.2* 2.4*  < > = values in this interval not displayed.  Assessment and Plan: Pt with past medical history significant for atrial fibrillation, hypertension, bladder cancer, rheumatoid arthritis, left ureteral obstruction secondary to stone/recent left PCN and acute on chronic renal failure. Patient now with findings of bilateral lower extremity DVT on venous doppler. He is not a candidate for anticoagulation/ antiplatelet therapy because of hematuria and request is now made for IVC filter placement. Currently afebrile, WBC 12.9, HGB 11.3, PLTS 137K, CREAT 2.11, K 4.5. Risks and benefits discussed with the patient/daughter including, but not limited to bleeding, infection, contrast induced renal failure, filter fracture or migration which can lead to emergency surgery  or even death, strut penetration with damage or irritation to adjacent structures and caval thrombosis.All of the patient's questions were answered, patient is agreeable to proceed. Consent signed and in chart.Procedure tent planned for 11/23.      Signed: D. Rowe Robert 03/16/2015, 2:47 PM   I spent a total of 15 minutes at the the patient's bedside AND on the patient's hospital floor or unit, greater than 50% of which was counseling/coordinating care for IVC filter placement

## 2015-03-16 NOTE — Progress Notes (Addendum)
*  Preliminary Results* Bilateral lower extremity venous duplex completed. The right lower extremity is positive for deep vein thrombosis involving the femoral, popliteal, tibioperoneal trunk, posterior tibial, and peroneal veins. The left lower extremity is positive for deep vein thrombosis involving a single right posterior tibial vein. There is no evidence of Baker's cyst bilaterally.  Preliminary results discussed with Dr. Allyson Sabal.  03/16/2015  Maudry Mayhew, RVT, RDCS, RDMS

## 2015-03-16 NOTE — Progress Notes (Signed)
OT Cancellation Note  Patient Details Name: Craig Waters. MRN: [id number] DOB: 1930/08/10   Cancelled Treatment:    Reason Eval/Treat Not Completed: Patient not medically ready  - Pt with Rt LE DVT and awaiting IVC filter placement.  New order for OT to start 11/23 - will check back then.   Darlina Rumpf [place], OTR/L [id number]  03/16/2015, 4:16 PM

## 2015-03-16 NOTE — Progress Notes (Signed)
PT Cancellation Note  Patient Details Name: Craig Waters. MRN: [id number] DOB: Oct 07, 1930   Cancelled Treatment:    Reason Eval/Treat Not Completed: Medical issues which prohibited therapy R LE DVT, order for IVC filter placed.  New PT order for starting 11/23.   Sophiah Rolin,KATHrine E 03/16/2015, 1:04 PM Carmelia Bake, PT, DPT 03/16/2015 Pager: [phone]

## 2015-03-16 NOTE — Progress Notes (Signed)
[facility] PROGRESS NOTE  Craig Waters. [id number] DOB: 11/06/1930 DOA: 03/14/2015 PCP: [name], MD  Length of stay: 2   Assessment/Plan: Principal Problem:   Atrial fibrillation with RVR ([place]) Active Problems:   HTN (hypertension)   Urothelial carcinoma (HCC)   Rheumatoid arthritis (HCC)   Atrial flutter with rapid ventricular response (HCC)   Acute on chronic renal failure (HCC)    Brief summary Craig Waters. is a 81 y.o. male a PMH of rheumatoid arthritis on chronic prednisone and other immunosuppressive therapy, high-grade urothelial cell carcinoma, under the care of Dr. [name] at [facility], recently admitted to hospital 10-31: for AKI, due to obstructive uropathy, had nephrostomy tube place   Patient presents with increase SOB, palpitation. He relates dyspnea for months but worse for last 2 days. He denies chest pain. He has not had surgery for urothelial cancer done. His urine continue to be bloody. He has left side nephrostomy tube in place. He was started on bactrim on friday for possible UTI, by his PCP.   Evaluation in the ED; He was found to be in A fib RVR. Chest x ray: stable cardiomegaly, mild elevated troponin at 0.50  Assessment and plan 1-A fib RVR; not sure if cardiology wants to continue with Cardizem Gtt?, Started on amiodarone. Rate marginally controlled Appreciate cardiology recommendations Patient continues to be normal sinus rhythm since yesterday D-dimer elevated, VQ scan to rule out PE may not change mx  Venous Doppler to rule out DVT, positive, order for IVC filter placed Patient may also have pulmonary embolism but Not a  candidate for anticoagulation/antiplatelet therapy because of hematuria.    2-Mild elevated troponin:  In setting of A fib RVR. Cardiology following Denies active chest pain.  Recent 2-D echo on 02/17/15 with an EF of 65-70%, no regional wall motion abnormalities were identified  3-Acute  on chronic renal failure: Stage III, baseline 1.5, H/O Urothelia cancer, bilateral hydronephrosis, left side nephrostomy in place.  Renal function steadily getting worse, multifactorial, patient also dehydrated in the setting of UTI. Patient currently receiving normal saline at 100 mL/h He was on bactrim that could increase BUN,  Left nephrostomy catheter seems to be draining, no hydronephrosis on the left Right-sided hydro-ureter nephrosis, urology following According to Urology no indication for acute intervention during this hospitalization Patient to follow-up with his urologist at [facility] Given slowly worsening renal function, does patient need right nephrostomy tube? Not much urine output from Foley overnight. Most of the urine output is from his left nephrostomy tube. Urology to make further recommendations    4-Hyperglycemia; SSI. Hemoglobin A1c 7.1   5-Recent UTI; continue IV ceftriaxone. Urine culture shows multiple morphologies  6. Metastatic bladder cancer-Known high grade bladder cancer s/p palliative resections at [facility] by Dr. [name].Last seen there 11/3 with planned palliative resection next month. CT 01/2015 with liver lesion and Lt pelvic adenopathy concerning for likely metastatic disease.    7. Constipation-aggressive constipation regimen initiated, will also give suppository today   DVT prophylaxsis SCDs  Code Status:      Code Status Orders        Start     Ordered   03/14/15 1626  Full code   Continuous     03/14/15 1626     Family Communication: family updated about patient's clinical progress Disposition Plan:  Anticipate discharge when hematuria improves     Consultants:  Neurology  Cardiology    Procedures:  None  Antibiotics: Anti-infectives    Start     Dose/Rate Route Frequency Ordered Stop   03/14/15 1545  cefTRIAXone (ROCEPHIN) 1 g in dextrose 5 % 50 mL IVPB     1 g 100 mL/hr over 30 Minutes Intravenous Every 24  hours 03/14/15 1538           HPI/Subjective: Patient still constipated  Objective: Filed Vitals:   03/16/15 0700 03/16/15 0800 03/16/15 0900 03/16/15 1000  BP: 151/64 134/62 144/64 133/68  Pulse: 90 94 95 91  Temp:  98.2 F (36.8 C)    TempSrc:  Oral    Resp: 17 17 21 12   Height:      Weight:      SpO2: 95% 94% 95% 95%    Intake/Output Summary (Last 24 hours) at 03/16/15 1058 Last data filed at 03/16/15 1017  Gross per 24 hour  Intake 2821.75 ml  Output   1985 ml  Net 836.75 ml    Exam:  General: No acute respiratory distress Lungs: Clear to auscultation bilaterally without wheezes or crackles Cardiovascular: Regular rate and rhythm without murmur gallop or rub normal S1 and S2 Abdomen: Nontender, nondistended, soft, bowel sounds positive, no rebound, no ascites, no appreciable mass Extremities: No significant cyanosis, clubbing, or edema bilateral lower extremities     Data Review   Micro Results Recent Results (from the past 240 hour(s))  Urine culture     Status: None   Collection Time: 03/15/15  5:33 AM  Result Value Ref Range Status   Specimen Description URINE, CATHETERIZED  Final   Special Requests NONE  Final   Culture   Final    MULTIPLE SPECIES PRESENT, SUGGEST RECOLLECTION Performed at [facility]    Report Status 03/16/2015 FINAL  Final    Radiology Reports Ct Abdomen Pelvis Wo Contrast  03/14/2015  CLINICAL DATA:  Acute on chronic renal failure. Prostatectomy. Hypertension. Uroepithelial cancer diagnosed 2/16. Left nephrostomy tube. Possible urinary tract infection. EXAM: CT ABDOMEN AND PELVIS WITHOUT CONTRAST TECHNIQUE: Multidetector CT imaging of the abdomen and pelvis was performed following the standard protocol without IV contrast. COMPARISON:  Plain films 02/20/2015.  Most recent CT 02/15/2015. FINDINGS: Lower chest: Volume loss with probable scarring at the anterior left lung base. Mild cardiomegaly with LAD coronary artery  atherosclerosis. Right pulmonary artery enlargement at 3.5 cm (image 1, series 2). Right hemidiaphragm elevation. Hepatobiliary: Suboptimal evaluation of the hypo attenuating posterior right hepatic lobe lesion secondary lack of IV contrast. Measures on the order of 4.2 cm in anterior posterior dimension today versus similar (when remeasured). No definite new liver lesions identified. Normal gallbladder, without biliary ductal dilatation. Pancreas: Mild pancreatic atrophy, within normal variation for age. No duct dilatation. Spleen: Normal in size, without focal abnormality. Adrenals/Urinary Tract: Normal adrenal glands. Low-density bilateral renal lesions are likely cysts. Bilateral renal cortical thinning. Placement of a left-sided nephrostomy catheter since the prior CT, with resolution of hydronephrosis. Mild right-sided hydroureteronephrosis is similar. Ureteric prominence followed to the level of the urinary bladder. Distal left ureteric stone is approximately 2 cm proximal to the bladder and measures 5 mm (image 86, series 2). Similar in position to on the prior. Bladder decompressed around a Foley catheter. Soft tissue fullness throughout is consistent with the clinical history of bladder carcinoma. There is irregularity in the pericystic fat, suggesting direct tumor extension. Soft tissue density also extends anteriorly toward the median umbilical ligament (image 93, series 2). This is grossly similar and suspicious for tumor involvement. The  fat plane between the seminal vesicles and posterior wall bladder is ill-defined on image 92. Soft tissue fullness in the region of the prostatectomy bed on image 97 may represent direct tumor extension. No pelvic sidewall extension. Stomach/Bowel: Normal stomach, without wall thickening. Normal colon and terminal ileum. Normal small bowel. Vascular/Lymphatic: Aortic and branch vessel atherosclerosis. Non aneurysmal dilatation of the infrarenal aorta 2.5 cm. Similar  small retroperitoneal nodes, without abdominal adenopathy. Left external iliac index node measures 10 mm on image 82 and is unchanged. Reproductive: prostatectomy. Soft tissue fullness within the prostatectomy bed is likely due to direct bladder extension. Other: No significant free fluid. Fat containing inguinal hernias are greater on the right. No evidence of omental or peritoneal disease. Musculoskeletal: Trace L3-4 anterolisthesis. Degenerative disc disease at this level. IMPRESSION: 1. Placement of a left nephrostomy catheter with resolution of left-sided hydronephrosis. Persistent right-sided hydroureteronephrosis, presumably secondary to bladder process. 2. Left distal ureteric stone, as before. 3. Bladder decompressed around a Foley catheter. Likely locally advanced bladder carcinoma with soft tissue fullness extending about the bladder. Similar mild left pelvic adenopathy, suspicious for nodal metastasis. 4. Grossly similar right hepatic lobe low-density lesion, suboptimally evaluated secondary to lack of contrast. 5. Pulmonary artery enlargement suggests pulmonary arterial hypertension. 6.  Atherosclerosis, including within the coronary arteries. Electronically Signed   By: Abigail Miyamoto M.D.   On: 03/14/2015 18:04   Ct Abdomen Pelvis Wo Contrast  02/15/2015  CLINICAL DATA:  81 year old male with abdominal pain and bloating since this morning. Recent dizziness. Acute renal failure. Current history of bladder cancer, gross hematuria. EXAM: CT ABDOMEN AND PELVIS WITHOUT CONTRAST TECHNIQUE: Multidetector CT imaging of the abdomen and pelvis was performed following the standard protocol without IV contrast. COMPARISON:  Renal ultrasound 02/12/2015. Lumbar MRI 07/29/2014. CT Abdomen and Pelvis 08/23/2011. FINDINGS: Chronic elevation of the right hemidiaphragm. Right middle lobe and lower lobe atelectasis with some air bronchograms. Superimposed bibasilar dependent atelectasis. Small volume pleural effusions  in both costophrenic angles. No pericardial effusion. Chronic degenerative changes in the spine. Chronic lumbar pars defects associated with L3-L4 anterolisthesis. No acute or suspicious osseous lesion identified. Abnormal bladder wall thickening with Foley catheter in place. Prostate surgery suspected since the 2013 comparison. No pelvic free fluid. Negative rectum. Pelvic sidewall lymphadenopathy greater on the left, up to 10 mm short axis. No inguinal lymphadenopathy. Redundant sigmoid colon with mild diverticulosis. Occasional diverticula in the left colon. Mild diverticula at the hepatic flexure. Negative right colon. Appendix not identified. There is trace right lower quadrant free fluid associated with left greater than right retroperitoneal and lateral Conal fascia stranding. See renal findings below. Most of the small bowel is decompressed. There are some gas-filled nondilated loops in the ventral abdomen. The stomach is distended with oral contrast. Negative duodenum. Noncontrast gallbladder, spleen, pancreas, and adrenal glands are within normal limits. There is a hypodense posterior right lobe liver mass approximating 5.4 cm diameter (series 2, image 32) Which is subtle in the absence of IV contrast. No other liver lesion identified. This is new since 2013. There is a 5 x 10 mm distal left ureteral stone located 10-15 mm from the left ureterovesical junction. There is associated left hydroureter and left greater than right periureteral stranding with mild left hydronephrosis. Likewise there is mild right hydronephrosis and hydroureter, however, no right ureteral calculus is identified. No nephrolithiasis. Bilateral hypodense renal cysts have increased have mildly increased since 2013. No pneumoperitoneum. No upper abdominal free fluid. Aortoiliac calcified atherosclerosis noted. Ectatic infrarenal  abdominal aorta measuring up to 27 mm diameter is stable since 2013. IMPRESSION: 1. 5 cm hypodense right  lobe liver mass is new since 2013 and highly suspicious for metastatic disease in this setting. 2. Bladder wall thickening. Bilateral hydronephrosis and hydroureter suggesting obstructive uropathy. On the left there is a distal ureteral calculus measuring 5 x 10 mm located about 1 cm from the left UVJ. No right side urologic calculus, consider obstruction due to bladder tumor. 3. Left greater than right pelvic sidewall lymphadenopathy, up to 10 mm short axis. 4. Trace pleural effusions. Right greater than left lung base atelectasis. Electronically Signed   By: Genevie Ann M.D.   On: 02/15/2015 16:51   Dg Chest 2 View  02/15/2015  CLINICAL DATA:  Increased weakness over the past few days. Worsening cough. Chest congestion. EXAM: CHEST  2 VIEW COMPARISON:  02/12/2015. FINDINGS: Poor inspiration. Enlarged cardiac silhouette with a mild increase in size. Chronically elevated right hemidiaphragm. Clear lungs. Small bilateral pleural effusions. Upper lumbar spine degenerative changes. IMPRESSION: 1. Small bilateral pleural effusions. 2. Mildly progressive cardiomegaly. 3. Stable chronically elevated right hemidiaphragm. Electronically Signed   By: Claudie Revering M.D.   On: 02/15/2015 10:48   Ir Fluoro Guide Cv Line Right  02/16/2015  CLINICAL DATA:  81 year old male admitted with acute renal failure. He has been referred for evaluation for hemodialysis catheter placement. Catheter placement is indicated. EXAM: IR RIGHT FLOURO GUIDE CV LINE; IR ULTRASOUND GUIDANCE VASC ACCESS RIGHT Date: 02/16/2015 ANESTHESIA/SEDATION: Moderate (conscious) sedation was administered during this procedure. A total of 0.5 mg Versed and 25 mg Fentanyl were administered intravenously. The patient's vital signs were monitored continuously by radiology nursing throughout the course of the procedure. Total sedation time: 11 minutes FLUOROSCOPY TIME:  18 seconds TECHNIQUE: The procedure, risks, benefits, and alternatives were explained to the  patient. Questions regarding the procedure were encouraged and answered. The patient understands and consents to the procedure. The right neck and chest was prepped with chlorhexidine, and draped in the usual sterile fashion using maximum barrier technique (cap and mask, sterile gown, sterile gloves, large sterile sheet, hand hygiene and cutaneous antiseptic). Local anesthesia was attained by infiltration with 1% lidocaine with epinephrine. Ultrasound demonstrated patency of the right internal jugular vein, and this was documented with an image. Under real-time ultrasound guidance, this vein was accessed with a 21 gauge micropuncture needle and image documentation was performed. A small dermatotomy was made at the access site with an 11 scalpel. A 0.018" wire was advanced into the SVC and the access needle exchanged for a 104F micropuncture vascular sheath. The 0.018" wire was then removed and a 0.035" wire advanced into the IVC. Dilation of the tissue tract was performed with a 10 French tissue dilator. A 20 cm triple-lumen trialysis catheter was then placed using Seldinger technique, with the tip terminating at the superior cavoatrial junction. Wire was removed.  The catheter was sutured in position. Patient tolerated the procedure well and remained hemodynamically stable throughout. No complications were encountered and no significant blood loss was encountered. COMPLICATIONS: None IMPRESSION: Status post placement of right IJ temporary hemodialysis catheter. Catheter ready for use. Signed, Dulcy Fanny. Earleen Newport, DO [facility] Electronically Signed   By: Corrie Mckusick D.O.   On: 02/16/2015 13:16   Ir US Guide Vasc Access Right  02/16/2015  CLINICAL DATA:  81 year old male admitted with acute renal failure. He has been referred for evaluation for hemodialysis catheter placement. Catheter placement  is indicated. EXAM: IR RIGHT FLOURO GUIDE CV LINE; IR  ULTRASOUND GUIDANCE VASC ACCESS RIGHT Date: 02/16/2015 ANESTHESIA/SEDATION: Moderate (conscious) sedation was administered during this procedure. A total of 0.5 mg Versed and 25 mg Fentanyl were administered intravenously. The patient's vital signs were monitored continuously by radiology nursing throughout the course of the procedure. Total sedation time: 11 minutes FLUOROSCOPY TIME:  18 seconds TECHNIQUE: The procedure, risks, benefits, and alternatives were explained to the patient. Questions regarding the procedure were encouraged and answered. The patient understands and consents to the procedure. The right neck and chest was prepped with chlorhexidine, and draped in the usual sterile fashion using maximum barrier technique (cap and mask, sterile gown, sterile gloves, large sterile sheet, hand hygiene and cutaneous antiseptic). Local anesthesia was attained by infiltration with 1% lidocaine with epinephrine. Ultrasound demonstrated patency of the right internal jugular vein, and this was documented with an image. Under real-time ultrasound guidance, this vein was accessed with a 21 gauge micropuncture needle and image documentation was performed. A small dermatotomy was made at the access site with an 11 scalpel. A 0.018" wire was advanced into the SVC and the access needle exchanged for a 42F micropuncture vascular sheath. The 0.018" wire was then removed and a 0.035" wire advanced into the IVC. Dilation of the tissue tract was performed with a 10 French tissue dilator. A 20 cm triple-lumen trialysis catheter was then placed using Seldinger technique, with the tip terminating at the superior cavoatrial junction. Wire was removed.  The catheter was sutured in position. Patient tolerated the procedure well and remained hemodynamically stable throughout. No complications were encountered and no significant blood loss was encountered. COMPLICATIONS: None IMPRESSION: Status post placement of right IJ temporary  hemodialysis catheter. Catheter ready for use. Signed, Dulcy Fanny. Earleen Newport, DO [facility] Electronically Signed   By: Corrie Mckusick D.O.   On: 02/16/2015 13:16   Dg Chest Port 1 View  03/14/2015  CLINICAL DATA:  Shortness of breath EXAM: PORTABLE CHEST 1 VIEW COMPARISON:  02/15/2015 chest radiograph. FINDINGS: Stable cardiomediastinal silhouette with mild cardiomegaly. No pneumothorax. Possible stable trace right pleural effusion. No appreciable left pleural effusion. No overt pulmonary edema. Stable elevation of the right hemidiaphragm. Stable right basilar curvilinear opacities, favor atelectasis. No new focal lung opacity. IMPRESSION: 1. Stable mild cardiomegaly without overt pulmonary edema. 2. Stable elevation of the right hemidiaphragm with likely stable trace right pleural effusion. 3. Stable curvilinear right basal lung opacities, favor atelectasis. No new focal lung opacity. Electronically Signed   By: Ilona Sorrel M.D.   On: 03/14/2015 13:18   Dg Abd 2 Views  02/20/2015  CLINICAL DATA:  Abdominal pain, follow-up ileus, status post nephrectomy EXAM: ABDOMEN - 2 VIEW COMPARISON:  02/19/2015 FINDINGS: Mild gaseous distention of small and large bowel, compatible with history of postoperative adynamic ileus. No evidence of bowel obstruction. Left percutaneous nephrostomy. Degenerative changes the lumbar spine. IMPRESSION: No evidence of bowel obstruction. Mild gaseous distention of small and large bowel, compatible with history of postoperative adynamic ileus. Electronically Signed   By: Julian Hy M.D.   On: 02/20/2015 12:28   Dg Abd Portable 1v  02/19/2015  CLINICAL DATA:  Abdominal pain, nausea, constipation EXAM: PORTABLE ABDOMEN - 1 VIEW COMPARISON:  None. FINDINGS: Left nephrostomy catheter is noted. Gaseous distended small bowel loops on mid abdomen highly suspicious for significant ileus or partial small bowel obstruction. Stool  and gas noted in distal colon. IMPRESSION: Gaseous distended small  bowel loops mid abdomen highly suspicious for significant ileus or bowel obstruction. Electronically Signed   By: Lahoma Crocker M.D.   On: 02/19/2015 14:10   Ir Nephrostomy Placement Left  02/16/2015  CLINICAL DATA:  81 year old male admitted with acute renal failure. Patient never has had dialysis. He has evidence on imaging of mild pelvicaliectasis with no frank hydronephrosis. CT demonstrates a distal left ureteral stone and minimal ureteral dilation. He has been referred for evaluation for urinary diversion, as a component of acute renal failure may be related to an obstruction. Drain catheter placement is indicated. EXAM: IR NEPHROSTOMY PLACEMENT LEFT COMPARISON:  None. ANESTHESIA/SEDATION: 2.0 IV Versed; 25 mcg IV Fentanyl. Total Moderate Sedation Time: 72 minutes. CONTRAST:  65mL OMNIPAQUE IOHEXOL 300 MG/ML  SOLN MEDICATIONS: 400 mg IV Cipro FLUOROSCOPY TIME:  12 minutes, 36 seconds TECHNIQUE: The procedure, risks, benefits, and alternatives were explained to the patient and the patient's family. Questions regarding the procedure were encouraged and answered. The patient understands and consents to the procedure. The bilateral flanks were prepped with chlorhexidine in a sterile fashion, and a sterile drape was applied covering the operative field. A sterile gown and sterile gloves were used for the procedure. Local anesthesia was provided with 1% Lidocaine. Ultrasound survey of both the left and right kidney was performed with images stored and sent to PACs. Left: Left kidney was then identified, without significant hydronephrosis. Ultrasound guidance was used to direct a 22 gauge coaxial needle into the interpolar region of the kidney. Once the tip of the needle was withdrawn into the collecting system, this access was used to opacify the renal collecting system with contrast. A double stick technique was then used after opacifying the  collecting system to select a lower pole posterior calyx for a second stick. A 22 gauge coaxial needle was then directed under fluoroscopy into this lower pole posterior calyx. A Nitrex wire was navigated through the infundibulum and collecting system into the ureter, and then using modified Seldinger technique, an Accustick system, and a 10 [place] dilator, a 10 French pigtail drainage catheter was placed into the renal pelvis. Near the conclusion of successful placement of the left-sided percutaneous nephrostomy tube, the patient was becoming restless and agitated. Further attempt at a right-sided percutaneous nephrostomy tube was deemed unsafe. Patient remained hemodynamically stable throughout. No complications encountered. COMPLICATIONS: None FINDINGS: Ultrasound survey of the left kidney demonstrates minimal pelvicaliectasis. Ultrasound survey of the right kidney demonstrates no hydronephrosis, and no significant pelvicaliectasis. Images during the case demonstrate placement of left-sided percutaneous nephrostomy tube into a posterior inferior calyx. No significant hydronephrosis with infusion of contrast. IMPRESSION: Status post placement of left-sided percutaneous nephrostomy tube. Signed, Dulcy Fanny. Earleen Newport, DO [facility] Radiology PLAN: Given that the patient's agitation and discomfort precluded safe attempt at placement of a right-sided percutaneous nephrostomy tube, further attempts may be reconsidered after interval medical therapy. Observation of urine clearance from the left percutaneous nephrostomy tube. Electronically Signed   By: Corrie Mckusick D.O.   On: 02/16/2015 13:29     CBC  Recent Labs Lab 03/14/15 1220 03/15/15 0334 03/16/15 0334  WBC 13.6* 15.1* 12.9*  HGB 13.5 12.3* 11.3*  HCT 41.9 38.2* 35.6*  PLT 125* 150 137*  MCV 92.3 92.7 93.4  MCH 29.7 29.9 29.7  MCHC 32.2 32.2 31.7  RDW 14.2 14.2 14.4  LYMPHSABS 1.3  --   --   MONOABS  0.9  --   --   EOSABS 0.1  --   --  BASOSABS 0.0  --   --     Chemistries   Recent Labs Lab 03/14/15 1220 03/15/15 0334 03/16/15 0334  NA 136 138 140  K 4.1 4.5 4.5  CL 102 106 107  CO2 22 23 25   GLUCOSE 203* 107* 97  BUN 23* 19 24*  CREATININE 1.94* 2.03* 2.11*  CALCIUM 8.2* 8.1* 8.0*  MG  --  1.9  --   AST  --   --  16  ALT  --   --  20  ALKPHOS  --   --  96  BILITOT  --   --  0.6   ------------------------------------------------------------------------------------------------------------------ estimated creatinine clearance is 31.5 mL/min (by C-G formula based on Cr of 2.11). ------------------------------------------------------------------------------------------------------------------  Recent Labs  03/14/15 1232  HGBA1C 7.1*   ------------------------------------------------------------------------------------------------------------------ No results for input(s): CHOL, HDL, LDLCALC, TRIG, CHOLHDL, LDLDIRECT in the last 72 hours. ------------------------------------------------------------------------------------------------------------------ No results for input(s): TSH, T4TOTAL, T3FREE, THYROIDAB in the last 72 hours.  Invalid input(s): FREET3 ------------------------------------------------------------------------------------------------------------------ No results for input(s): VITAMINB12, FOLATE, FERRITIN, TIBC, IRON, RETICCTPCT in the last 72 hours.  Coagulation profile No results for input(s): INR, PROTIME in the last 168 hours.   Recent Labs  03/14/15 1220  DDIMER >20.00*    Cardiac Enzymes  Recent Labs Lab 03/14/15 2140 03/15/15 0334 03/16/15 0334  TROPONINI 0.72* 0.53* 0.25*   ------------------------------------------------------------------------------------------------------------------ Invalid input(s): POCBNP   CBG:  Recent Labs Lab 03/15/15 0717 03/15/15 1229 03/15/15 1626 03/15/15 2125 03/16/15 0749  GLUCAP 102*  153* 160* 111* 98       Studies: Ct Abdomen Pelvis Wo Contrast  03/14/2015  CLINICAL DATA:  Acute on chronic renal failure. Prostatectomy. Hypertension. Uroepithelial cancer diagnosed 2/16. Left nephrostomy tube. Possible urinary tract infection. EXAM: CT ABDOMEN AND PELVIS WITHOUT CONTRAST TECHNIQUE: Multidetector CT imaging of the abdomen and pelvis was performed following the standard protocol without IV contrast. COMPARISON:  Plain films 02/20/2015.  Most recent CT 02/15/2015. FINDINGS: Lower chest: Volume loss with probable scarring at the anterior left lung base. Mild cardiomegaly with LAD coronary artery atherosclerosis. Right pulmonary artery enlargement at 3.5 cm (image 1, series 2). Right hemidiaphragm elevation. Hepatobiliary: Suboptimal evaluation of the hypo attenuating posterior right hepatic lobe lesion secondary lack of IV contrast. Measures on the order of 4.2 cm in anterior posterior dimension today versus similar (when remeasured). No definite new liver lesions identified. Normal gallbladder, without biliary ductal dilatation. Pancreas: Mild pancreatic atrophy, within normal variation for age. No duct dilatation. Spleen: Normal in size, without focal abnormality. Adrenals/Urinary Tract: Normal adrenal glands. Low-density bilateral renal lesions are likely cysts. Bilateral renal cortical thinning. Placement of a left-sided nephrostomy catheter since the prior CT, with resolution of hydronephrosis. Mild right-sided hydroureteronephrosis is similar. Ureteric prominence followed to the level of the urinary bladder. Distal left ureteric stone is approximately 2 cm proximal to the bladder and measures 5 mm (image 86, series 2). Similar in position to on the prior. Bladder decompressed around a Foley catheter. Soft tissue fullness throughout is consistent with the clinical history of bladder carcinoma. There is irregularity in the pericystic fat, suggesting direct tumor extension. Soft tissue  density also extends anteriorly toward the median umbilical ligament (image 93, series 2). This is grossly similar and suspicious for tumor involvement. The fat plane between the seminal vesicles and posterior wall bladder is ill-defined on image 92. Soft tissue fullness in the region of the prostatectomy bed on image 97 may represent direct tumor extension. No pelvic sidewall extension. Stomach/Bowel: Normal stomach,  without wall thickening. Normal colon and terminal ileum. Normal small bowel. Vascular/Lymphatic: Aortic and branch vessel atherosclerosis. Non aneurysmal dilatation of the infrarenal aorta 2.5 cm. Similar small retroperitoneal nodes, without abdominal adenopathy. Left external iliac index node measures 10 mm on image 82 and is unchanged. Reproductive: prostatectomy. Soft tissue fullness within the prostatectomy bed is likely due to direct bladder extension. Other: No significant free fluid. Fat containing inguinal hernias are greater on the right. No evidence of omental or peritoneal disease. Musculoskeletal: Trace L3-4 anterolisthesis. Degenerative disc disease at this level. IMPRESSION: 1. Placement of a left nephrostomy catheter with resolution of left-sided hydronephrosis. Persistent right-sided hydroureteronephrosis, presumably secondary to bladder process. 2. Left distal ureteric stone, as before. 3. Bladder decompressed around a Foley catheter. Likely locally advanced bladder carcinoma with soft tissue fullness extending about the bladder. Similar mild left pelvic adenopathy, suspicious for nodal metastasis. 4. Grossly similar right hepatic lobe low-density lesion, suboptimally evaluated secondary to lack of contrast. 5. Pulmonary artery enlargement suggests pulmonary arterial hypertension. 6.  Atherosclerosis, including within the coronary arteries. Electronically Signed   By: Abigail Miyamoto M.D.   On: 03/14/2015 18:04   Dg Chest Port 1 View  03/14/2015  CLINICAL DATA:  Shortness of breath  EXAM: PORTABLE CHEST 1 VIEW COMPARISON:  02/15/2015 chest radiograph. FINDINGS: Stable cardiomediastinal silhouette with mild cardiomegaly. No pneumothorax. Possible stable trace right pleural effusion. No appreciable left pleural effusion. No overt pulmonary edema. Stable elevation of the right hemidiaphragm. Stable right basilar curvilinear opacities, favor atelectasis. No new focal lung opacity. IMPRESSION: 1. Stable mild cardiomegaly without overt pulmonary edema. 2. Stable elevation of the right hemidiaphragm with likely stable trace right pleural effusion. 3. Stable curvilinear right basal lung opacities, favor atelectasis. No new focal lung opacity. Electronically Signed   By: Ilona Sorrel M.D.   On: 03/14/2015 13:18      Lab Results  Component Value Date   HGBA1C 7.1* 03/14/2015   HGBA1C 6.5* 05/15/2014   Lab Results  Component Value Date   LDLCALC 117* 10/24/2010   CREATININE 2.11* 03/16/2015       Scheduled Meds: . amiodarone  200 mg Oral BID  . bisacodyl  10 mg Rectal Once  . cefTRIAXone (ROCEPHIN)  IV  1 g Intravenous Q24H  . docusate sodium  100 mg Oral BID  . insulin aspart  0-9 Units Subcutaneous TID WC  . multivitamin with minerals  1 tablet Oral Daily  . polyethylene glycol  17 g Oral Daily  . predniSONE  7.5 mg Oral Q breakfast  . senna  1 tablet Oral Daily  . sodium chloride  3 mL Intravenous Q12H  . sodium chloride  3 mL Intravenous Q12H   Continuous Infusions: . sodium chloride 1,000 mL (03/16/15 0321)  . diltiazem (CARDIZEM) infusion 5 mg/hr (03/16/15 0320)    Principal Problem:   Atrial fibrillation with RVR (HCC) Active Problems:   HTN (hypertension)   Urothelial carcinoma (HCC)   Rheumatoid arthritis (HCC)   Atrial flutter with rapid ventricular response ([place])   Acute on chronic renal failure ([place])    Time spent: 45 minutes   [place] Pager [phone]. If 7PM-7AM, please contact night-coverage at [email or web address],  password [facility] 03/16/2015, 10:58 AM  LOS: 2 days

## 2015-03-16 NOTE — Progress Notes (Signed)
Subjective:  Patient denies any chest pain or shortness of breath complaints of abdominal pain. Remains in sinus rhythm. Cardiac enzymes trending down Patient also noted to have DVT being evaluated for possible IVC filter.  Objective:  Vital Signs in the last 24 hours: Temp:  [98.2 F (36.8 C)-99.3 F (37.4 C)] 98.2 F (36.8 C) (11/22 0800) Pulse Rate:  [82-95] 94 (11/22 1100) Resp:  [12-23] 19 (11/22 1100) BP: (98-151)/(43-82) 139/71 mmHg (11/22 1100) SpO2:  [94 %-98 %] 95 % (11/22 1100) Weight:  [93.5 kg (206 lb 2.1 oz)] 93.5 kg (206 lb 2.1 oz) (11/22 0500)  Intake/Output from previous day: 11/21 0701 - 11/22 0700 In: 3151.3 [P.O.:480; I.V.:2571.3; IV Piggyback:100] Out: 1970 [Urine:1970] Intake/Output from this shift: Total I/O In: 423 [I.V.:423] Out: 625 [Urine:625]  Physical Exam: Neck: no adenopathy, no carotid bruit, no JVD and supple, symmetrical, trachea midline Lungs: clear to auscultation bilaterally Heart: regular rate and rhythm, S1, S2 normal and Systolic murmur noted Abdomen: soft, non-tender; bowel sounds normal; no masses,  no organomegaly Extremities: extremities normal, atraumatic, no cyanosis or edema Left nephrostomy tube and indwelling Foley catheter with minimal bloody urine noted Lab Results:  Recent Labs  03/15/15 0334 03/16/15 0334  WBC 15.1* 12.9*  HGB 12.3* 11.3*  PLT 150 137*    Recent Labs  03/15/15 0334 03/16/15 0334  NA 138 140  K 4.5 4.5  CL 106 107  CO2 23 25  GLUCOSE 107* 97  BUN 19 24*  CREATININE 2.03* 2.11*    Recent Labs  03/15/15 0334 03/16/15 0334  TROPONINI 0.53* 0.25*   Hepatic Function Panel  Recent Labs  03/16/15 0334  PROT 5.3*  ALBUMIN 2.4*  AST 16  ALT 20  ALKPHOS 96  BILITOT 0.6   No results for input(s): CHOL in the last 72 hours. No results for input(s): PROTIME in the last 72 hours.  Imaging: Imaging results have been reviewed and Ct Abdomen Pelvis Wo Contrast  03/14/2015  CLINICAL DATA:   Acute on chronic renal failure. Prostatectomy. Hypertension. Uroepithelial cancer diagnosed 2/16. Left nephrostomy tube. Possible urinary tract infection. EXAM: CT ABDOMEN AND PELVIS WITHOUT CONTRAST TECHNIQUE: Multidetector CT imaging of the abdomen and pelvis was performed following the standard protocol without IV contrast. COMPARISON:  Plain films 02/20/2015.  Most recent CT 02/15/2015. FINDINGS: Lower chest: Volume loss with probable scarring at the anterior left lung base. Mild cardiomegaly with LAD coronary artery atherosclerosis. Right pulmonary artery enlargement at 3.5 cm (image 1, series 2). Right hemidiaphragm elevation. Hepatobiliary: Suboptimal evaluation of the hypo attenuating posterior right hepatic lobe lesion secondary lack of IV contrast. Measures on the order of 4.2 cm in anterior posterior dimension today versus similar (when remeasured). No definite new liver lesions identified. Normal gallbladder, without biliary ductal dilatation. Pancreas: Mild pancreatic atrophy, within normal variation for age. No duct dilatation. Spleen: Normal in size, without focal abnormality. Adrenals/Urinary Tract: Normal adrenal glands. Low-density bilateral renal lesions are likely cysts. Bilateral renal cortical thinning. Placement of a left-sided nephrostomy catheter since the prior CT, with resolution of hydronephrosis. Mild right-sided hydroureteronephrosis is similar. Ureteric prominence followed to the level of the urinary bladder. Distal left ureteric stone is approximately 2 cm proximal to the bladder and measures 5 mm (image 86, series 2). Similar in position to on the prior. Bladder decompressed around a Foley catheter. Soft tissue fullness throughout is consistent with the clinical history of bladder carcinoma. There is irregularity in the pericystic fat, suggesting direct tumor extension. Soft tissue  density also extends anteriorly toward the median umbilical ligament (image 93, series 2). This is  grossly similar and suspicious for tumor involvement. The fat plane between the seminal vesicles and posterior wall bladder is ill-defined on image 92. Soft tissue fullness in the region of the prostatectomy bed on image 97 may represent direct tumor extension. No pelvic sidewall extension. Stomach/Bowel: Normal stomach, without wall thickening. Normal colon and terminal ileum. Normal small bowel. Vascular/Lymphatic: Aortic and branch vessel atherosclerosis. Non aneurysmal dilatation of the infrarenal aorta 2.5 cm. Similar small retroperitoneal nodes, without abdominal adenopathy. Left external iliac index node measures 10 mm on image 82 and is unchanged. Reproductive: prostatectomy. Soft tissue fullness within the prostatectomy bed is likely due to direct bladder extension. Other: No significant free fluid. Fat containing inguinal hernias are greater on the right. No evidence of omental or peritoneal disease. Musculoskeletal: Trace L3-4 anterolisthesis. Degenerative disc disease at this level. IMPRESSION: 1. Placement of a left nephrostomy catheter with resolution of left-sided hydronephrosis. Persistent right-sided hydroureteronephrosis, presumably secondary to bladder process. 2. Left distal ureteric stone, as before. 3. Bladder decompressed around a Foley catheter. Likely locally advanced bladder carcinoma with soft tissue fullness extending about the bladder. Similar mild left pelvic adenopathy, suspicious for nodal metastasis. 4. Grossly similar right hepatic lobe low-density lesion, suboptimally evaluated secondary to lack of contrast. 5. Pulmonary artery enlargement suggests pulmonary arterial hypertension. 6.  Atherosclerosis, including within the coronary arteries. Electronically Signed   By: Abigail Miyamoto M.D.   On: 03/14/2015 18:04   Dg Chest Port 1 View  03/14/2015  CLINICAL DATA:  Shortness of breath EXAM: PORTABLE CHEST 1 VIEW COMPARISON:  02/15/2015 chest radiograph. FINDINGS: Stable  cardiomediastinal silhouette with mild cardiomegaly. No pneumothorax. Possible stable trace right pleural effusion. No appreciable left pleural effusion. No overt pulmonary edema. Stable elevation of the right hemidiaphragm. Stable right basilar curvilinear opacities, favor atelectasis. No new focal lung opacity. IMPRESSION: 1. Stable mild cardiomegaly without overt pulmonary edema. 2. Stable elevation of the right hemidiaphragm with likely stable trace right pleural effusion. 3. Stable curvilinear right basal lung opacities, favor atelectasis. No new focal lung opacity. Electronically Signed   By: Ilona Sorrel M.D.   On: 03/14/2015 13:18    Cardiac Studies:  Assessment/Plan:  Status post a flutter with 2 to one block, converted to sinus rhythm. Status post small non-Q-wave myocardial infarction due to elevated troponin I probably secondary to demand ischemia. Hypertension. Metastatic bladder cancer. Status post left ureteral obstruction/left nephrostomy tube. Right hydronephrosis Chronic kidney disease. Rheumatoid arthritis. Benign hypertrophy of prostate History of peptic ulcer disease. Bilateral DVT Plan Switch IV Cardizem to by mouth as per orders Continue by mouth amiodarone Medical management from cardiac point of view  LOS: 2 days    Charolette Forward 03/16/2015, 11:28 AM

## 2015-03-17 ENCOUNTER — Inpatient Hospital Stay (HOSPITAL_COMMUNITY): Payer: Medicare Other

## 2015-03-17 DIAGNOSIS — N189 Chronic kidney disease, unspecified: Secondary | ICD-10-CM

## 2015-03-17 DIAGNOSIS — N179 Acute kidney failure, unspecified: Secondary | ICD-10-CM

## 2015-03-17 DIAGNOSIS — I4891 Unspecified atrial fibrillation: Secondary | ICD-10-CM

## 2015-03-17 DIAGNOSIS — R319 Hematuria, unspecified: Secondary | ICD-10-CM

## 2015-03-17 DIAGNOSIS — I10 Essential (primary) hypertension: Secondary | ICD-10-CM

## 2015-03-17 LAB — URINALYSIS, ROUTINE W REFLEX MICROSCOPIC
Bilirubin Urine: NEGATIVE
GLUCOSE, UA: NEGATIVE mg/dL
Ketones, ur: 15 mg/dL — AB
NITRITE: POSITIVE — AB
PH: 5.5 (ref 5.0–8.0)
PROTEIN: 30 mg/dL — AB
Specific Gravity, Urine: 1.019 (ref 1.005–1.030)

## 2015-03-17 LAB — CBC
HCT: 37.6 % — ABNORMAL LOW (ref 39.0–52.0)
Hemoglobin: 12.1 g/dL — ABNORMAL LOW (ref 13.0–17.0)
MCH: 30.1 pg (ref 26.0–34.0)
MCHC: 32.2 g/dL (ref 30.0–36.0)
MCV: 93.5 fL (ref 78.0–100.0)
PLATELETS: 186 10*3/uL (ref 150–400)
RBC: 4.02 MIL/uL — AB (ref 4.22–5.81)
RDW: 14.4 % (ref 11.5–15.5)
WBC: 13.7 10*3/uL — AB (ref 4.0–10.5)

## 2015-03-17 LAB — BASIC METABOLIC PANEL
Anion gap: 9 (ref 5–15)
BUN: 19 mg/dL (ref 6–20)
CHLORIDE: 108 mmol/L (ref 101–111)
CO2: 23 mmol/L (ref 22–32)
CREATININE: 1.76 mg/dL — AB (ref 0.61–1.24)
Calcium: 8.2 mg/dL — ABNORMAL LOW (ref 8.9–10.3)
GFR calc non Af Amer: 34 mL/min — ABNORMAL LOW (ref >60)
GFR, EST AFRICAN AMERICAN: 40 mL/min — AB (ref >60)
Glucose, Bld: 87 mg/dL (ref 65–99)
POTASSIUM: 4.3 mmol/L (ref 3.5–5.1)
SODIUM: 140 mmol/L (ref 135–145)

## 2015-03-17 LAB — URINE MICROSCOPIC-ADD ON

## 2015-03-17 LAB — GLUCOSE, CAPILLARY
Glucose-Capillary: 80 mg/dL (ref 65–99)
Glucose-Capillary: 189 mg/dL — ABNORMAL HIGH (ref 65–99)
Glucose-Capillary: 144 mg/dL — ABNORMAL HIGH (ref 65–99)

## 2015-03-17 LAB — PROTIME-INR
INR: 1.23 (ref 0.00–1.49)
Prothrombin Time: 15.7 seconds — ABNORMAL HIGH (ref 11.6–15.2)

## 2015-03-17 MED ORDER — TECHNETIUM TO 99M ALBUMIN AGGREGATED
4.1000 | Freq: Once | INTRAVENOUS | Status: DC | PRN
Start: 2015-03-17 — End: 2015-03-20

## 2015-03-17 MED ORDER — TECHNETIUM TC 99M DIETHYLENETRIAME-PENTAACETIC ACID: 31.0000 | Freq: Once | INTRAVENOUS | Status: DC | PRN

## 2015-03-17 MED ORDER — DILTIAZEM HCL 60 MG PO TABS
60.0000 mg | ORAL_TABLET | Freq: Four times a day (QID) | ORAL | Status: DC
  Administered 2015-03-17 – 2015-03-19 (×7): 60 mg via ORAL
  Filled 2015-03-17 (×7): qty 1

## 2015-03-17 NOTE — Progress Notes (Signed)
Date: March 17, 2015 Chart reviewed for concurrent status and case management needs. Will continue to follow patient for changes and needs: Velva Harman, RN, BSN, [place]   [phone]

## 2015-03-17 NOTE — Progress Notes (Signed)
[facility] PROGRESS NOTE  Craig Waters. LR:[id number] DOB: [birth year] DOA: 03/14/2015 PCP: [name], MD    Assessment/Plan: Principal Problem:   Atrial fibrillation with RVR ([place]) Active Problems:   HTN (hypertension)   Urothelial carcinoma (HCC)   Rheumatoid arthritis (HCC)   Atrial flutter with rapid ventricular response (HCC)   Acute on chronic renal failure (HCC)   Hematuria    Brief summary Craig Hilbert. is a 81 y.o. male a PMH of rheumatoid arthritis on chronic prednisone and other immunosuppressive therapy, high-grade urothelial cell carcinoma, under the care of Dr. [name] at [facility], recently admitted to hospital 10-31: for AKI, due to obstructive uropathy, had nephrostomy tube place   Patient presents with increase SOB, palpitation. He relates dyspnea for months but worse for last 2 days. He denies chest pain. He has not had surgery for urothelial cancer done. His urine continue to be bloody. He has left side nephrostomy tube in place. He was started on bactrim on friday for possible UTI, by his PCP.   Evaluation in the ED; He was found to be in A fib RVR. Chest x ray: stable cardiomegaly, mild elevated troponin at 0.50  Assessment and plan 1-A fib RVR; initially started on cardizem gtt , later on changed to po cardizem, and resume the amiodarone. Rate marginally controlled Appreciate cardiology recommendations Patient continues to be normal sinus rhythm. D-dimer elevated, and he was found to have DVT,  IVC filter placed ON 11/22.  Patient may also have pulmonary embolism but Not a  candidate for anticoagulation/antiplatelet therapy because of hematuria.    2-Mild elevated troponin:  In setting of A fib RVR. Cardiology following.  Denies active chest pain.  Recent 2-D echo on 02/17/15 with an EF of 65-70%, no regional wall motion abnormalities were identified.  No further interventions.   3-Acute on chronic renal failure: Stage  III, baseline 1.5, H/O Urothelia cancer, bilateral hydronephrosis, left side nephrostomy in place.  Renal function improving, multifactorial, possibly from dehydration and UTI. Left nephrostomy catheter seems to be draining, no hydronephrosis on the left. Right-sided hydro-ureter nephrosis, urology following. According to Urology no indication for acute intervention during this hospitalization. Patient to follow-up with his urologist at [facility]. His most of the urine output is from the left nephrostomy tube and minimal output from the foley catheter.     4-Hyperglycemia; SSI. Hemoglobin A1c 7.1 CBG (last 3)   Recent Labs  03/16/15 1810 03/16/15 2154 03/17/15 0729  GLUCAP 162* 81 80   His Cbg'S are much better.     5-Recent UTI; continue IV ceftriaxone. Urine culture shows multiple morphologies. Repeat UA ordered.   6. Metastatic bladder cancer-Known high grade bladder cancer s/p palliative resections at [facility] by Dr. [name].Last seen there 11/3 with planned palliative resection next month. CT 01/2015 with liver lesion and Lt pelvic adenopathy concerning for likely metastatic disease.    7. Constipation- resolved. Had BM yesterday.    DVT prophylaxsis SCDs  Code Status:      Code Status Orders        Start     Ordered   03/14/15 1626  Full code   Continuous     03/14/15 1626     Family Communication: family updated about patient's clinical progress Disposition Plan:  Anticipate discharge when hematuria improves     Consultants:  Neurology  Cardiology    Procedures:  None  Antibiotics: Anti-infectives    Start  Dose/Rate Route Frequency Ordered Stop   03/14/15 1545  cefTRIAXone (ROCEPHIN) 1 g in dextrose 5 % 50 mL IVPB     1 g 100 mL/hr over 30 Minutes Intravenous Every 24 hours 03/14/15 1538           HPI/Subjective: Wants to know if he needs any urology interventions.   Objective: Filed Vitals:   03/17/15 0548 03/17/15 0551  03/17/15 0600 03/17/15 0800  BP:  140/59 133/76   Pulse:   102   Temp:    99.2 F (37.3 C)  TempSrc:    Oral  Resp:   19   Height:      Weight: 92.7 kg (204 lb 5.9 oz)     SpO2:   96%     Intake/Output Summary (Last 24 hours) at 03/17/15 1008 Last data filed at 03/17/15 1004  Gross per 24 hour  Intake    996 ml  Output   2001 ml  Net  -1005 ml    Exam:  General: No acute respiratory distress Lungs: Clear to auscultation bilaterally without wheezes or crackles Cardiovascular: Regular rate and rhythm without murmur gallop or rub normal S1 and S2 Abdomen: Nontender, nondistended, soft, bowel sounds positive, no rebound, no ascites, no appreciable mass Extremities: No significant cyanosis, clubbing, or edema bilateral lower extremities     Data Review   Micro Results Recent Results (from the past 240 hour(s))  Urine culture     Status: None   Collection Time: 03/15/15  5:33 AM  Result Value Ref Range Status   Specimen Description URINE, CATHETERIZED  Final   Special Requests NONE  Final   Culture   Final    MULTIPLE SPECIES PRESENT, SUGGEST RECOLLECTION Performed at [facility]    Report Status 03/16/2015 FINAL  Final    Radiology Reports Ct Abdomen Pelvis Wo Contrast  03/14/2015  CLINICAL DATA:  Acute on chronic renal failure. Prostatectomy. Hypertension. Uroepithelial cancer diagnosed 2/16. Left nephrostomy tube. Possible urinary tract infection. EXAM: CT ABDOMEN AND PELVIS WITHOUT CONTRAST TECHNIQUE: Multidetector CT imaging of the abdomen and pelvis was performed following the standard protocol without IV contrast. COMPARISON:  Plain films 02/20/2015.  Most recent CT 02/15/2015. FINDINGS: Lower chest: Volume loss with probable scarring at the anterior left lung base. Mild cardiomegaly with LAD coronary artery atherosclerosis. Right pulmonary artery enlargement at 3.5 cm (image 1, series 2). Right hemidiaphragm elevation. Hepatobiliary: Suboptimal evaluation  of the hypo attenuating posterior right hepatic lobe lesion secondary lack of IV contrast. Measures on the order of 4.2 cm in anterior posterior dimension today versus similar (when remeasured). No definite new liver lesions identified. Normal gallbladder, without biliary ductal dilatation. Pancreas: Mild pancreatic atrophy, within normal variation for age. No duct dilatation. Spleen: Normal in size, without focal abnormality. Adrenals/Urinary Tract: Normal adrenal glands. Low-density bilateral renal lesions are likely cysts. Bilateral renal cortical thinning. Placement of a left-sided nephrostomy catheter since the prior CT, with resolution of hydronephrosis. Mild right-sided hydroureteronephrosis is similar. Ureteric prominence followed to the level of the urinary bladder. Distal left ureteric stone is approximately 2 cm proximal to the bladder and measures 5 mm (image 86, series 2). Similar in position to on the prior. Bladder decompressed around a Foley catheter. Soft tissue fullness throughout is consistent with the clinical history of bladder carcinoma. There is irregularity in the pericystic fat, suggesting direct tumor extension. Soft tissue density also extends anteriorly toward the median umbilical ligament (image 93, series 2). This is grossly similar  and suspicious for tumor involvement. The fat plane between the seminal vesicles and posterior wall bladder is ill-defined on image 92. Soft tissue fullness in the region of the prostatectomy bed on image 97 may represent direct tumor extension. No pelvic sidewall extension. Stomach/Bowel: Normal stomach, without wall thickening. Normal colon and terminal ileum. Normal small bowel. Vascular/Lymphatic: Aortic and branch vessel atherosclerosis. Non aneurysmal dilatation of the infrarenal aorta 2.5 cm. Similar small retroperitoneal nodes, without abdominal adenopathy. Left external iliac index node measures 10 mm on image 82 and is unchanged. Reproductive:  prostatectomy. Soft tissue fullness within the prostatectomy bed is likely due to direct bladder extension. Other: No significant free fluid. Fat containing inguinal hernias are greater on the right. No evidence of omental or peritoneal disease. Musculoskeletal: Trace L3-4 anterolisthesis. Degenerative disc disease at this level. IMPRESSION: 1. Placement of a left nephrostomy catheter with resolution of left-sided hydronephrosis. Persistent right-sided hydroureteronephrosis, presumably secondary to bladder process. 2. Left distal ureteric stone, as before. 3. Bladder decompressed around a Foley catheter. Likely locally advanced bladder carcinoma with soft tissue fullness extending about the bladder. Similar mild left pelvic adenopathy, suspicious for nodal metastasis. 4. Grossly similar right hepatic lobe low-density lesion, suboptimally evaluated secondary to lack of contrast. 5. Pulmonary artery enlargement suggests pulmonary arterial hypertension. 6.  Atherosclerosis, including within the coronary arteries. Electronically Signed   By: Abigail Miyamoto M.D.   On: 03/14/2015 18:04   Ct Abdomen Pelvis Wo Contrast  02/15/2015  CLINICAL DATA:  81 year old male with abdominal pain and bloating since this morning. Recent dizziness. Acute renal failure. Current history of bladder cancer, gross hematuria. EXAM: CT ABDOMEN AND PELVIS WITHOUT CONTRAST TECHNIQUE: Multidetector CT imaging of the abdomen and pelvis was performed following the standard protocol without IV contrast. COMPARISON:  Renal ultrasound 02/12/2015. Lumbar MRI 07/29/2014. CT Abdomen and Pelvis 08/23/2011. FINDINGS: Chronic elevation of the right hemidiaphragm. Right middle lobe and lower lobe atelectasis with some air bronchograms. Superimposed bibasilar dependent atelectasis. Small volume pleural effusions in both costophrenic angles. No pericardial effusion. Chronic degenerative changes in the spine. Chronic lumbar pars defects associated with L3-L4  anterolisthesis. No acute or suspicious osseous lesion identified. Abnormal bladder wall thickening with Foley catheter in place. Prostate surgery suspected since the 2013 comparison. No pelvic free fluid. Negative rectum. Pelvic sidewall lymphadenopathy greater on the left, up to 10 mm short axis. No inguinal lymphadenopathy. Redundant sigmoid colon with mild diverticulosis. Occasional diverticula in the left colon. Mild diverticula at the hepatic flexure. Negative right colon. Appendix not identified. There is trace right lower quadrant free fluid associated with left greater than right retroperitoneal and lateral Conal fascia stranding. See renal findings below. Most of the small bowel is decompressed. There are some gas-filled nondilated loops in the ventral abdomen. The stomach is distended with oral contrast. Negative duodenum. Noncontrast gallbladder, spleen, pancreas, and adrenal glands are within normal limits. There is a hypodense posterior right lobe liver mass approximating 5.4 cm diameter (series 2, image 32) Which is subtle in the absence of IV contrast. No other liver lesion identified. This is new since 2013. There is a 5 x 10 mm distal left ureteral stone located 10-15 mm from the left ureterovesical junction. There is associated left hydroureter and left greater than right periureteral stranding with mild left hydronephrosis. Likewise there is mild right hydronephrosis and hydroureter, however, no right ureteral calculus is identified. No nephrolithiasis. Bilateral hypodense renal cysts have increased have mildly increased since 2013. No pneumoperitoneum. No upper abdominal free fluid.  Aortoiliac calcified atherosclerosis noted. Ectatic infrarenal abdominal aorta measuring up to 27 mm diameter is stable since 2013. IMPRESSION: 1. 5 cm hypodense right lobe liver mass is new since 2013 and highly suspicious for metastatic disease in this setting. 2. Bladder wall thickening. Bilateral hydronephrosis  and hydroureter suggesting obstructive uropathy. On the left there is a distal ureteral calculus measuring 5 x 10 mm located about 1 cm from the left UVJ. No right side urologic calculus, consider obstruction due to bladder tumor. 3. Left greater than right pelvic sidewall lymphadenopathy, up to 10 mm short axis. 4. Trace pleural effusions. Right greater than left lung base atelectasis. Electronically Signed   By: Genevie Ann M.D.   On: 02/15/2015 16:51   Dg Chest 2 View  02/15/2015  CLINICAL DATA:  Increased weakness over the past few days. Worsening cough. Chest congestion. EXAM: CHEST  2 VIEW COMPARISON:  02/12/2015. FINDINGS: Poor inspiration. Enlarged cardiac silhouette with a mild increase in size. Chronically elevated right hemidiaphragm. Clear lungs. Small bilateral pleural effusions. Upper lumbar spine degenerative changes. IMPRESSION: 1. Small bilateral pleural effusions. 2. Mildly progressive cardiomegaly. 3. Stable chronically elevated right hemidiaphragm. Electronically Signed   By: Claudie Revering M.D.   On: 02/15/2015 10:48   Ir Ivc Filter Plmt / S&i /img Guid/mod Sed  03/16/2015  CLINICAL DATA:  81 year old male with a history of bilateral renal obstruction secondary to carcinoma. He has developed right lower extremity DVT with hematuria and is not a candidate for anti coagulation. EXAM: 1. ULTRASOUND GUIDANCE FOR VASCULAR ACCESS OF THE RIGHT INTERNAL JUGULAR VEIN. 2. IVC VENOGRAM. 3. PERCUTANEOUS IVC FILTER PLACEMENT. ANESTHESIA/SEDATION: None CONTRAST:  CO2 contrast FLUOROSCOPY TIME:  One minutes. PROCEDURE: The procedure, risks, benefits, and alternatives were explained to the patient. Specific risks discussed include bleeding, infection, contrast reaction, renal failure, IVC filter fracture, migration, ileo caval thrombus (3% incidence), need for further procedure, need for further surgery, pulmonary embolism, cardiopulmonary collapse, death. Questions regarding the procedure were encouraged and  answered. The patient understands and consents to the procedure. Ultrasound survey was performed with images stored and sent to PACs. The neck was prepped with Betadine in a sterile fashion, and a sterile drape was applied covering the operative field. A sterile gown and sterile gloves were used for the procedure. Local anesthesia was provided with 1% Lidocaine. A micropuncture needle was used access the right internal jugular vein under ultrasound. With excellent venous blood flow returned, and an .018 micro wire was passed through the needle. Small incision was made with an 11 blade scalpel. The needle was removed, and a micropuncture sheath was placed over the wire. The inner dilator and wire were removed, and an 035 Bentson wire was advanced under fluoroscopy into the IVC. Serial dilation of the soft tissue tract was performed with an 8 [place] dilator and subsequently a 10 [place] dilator. The delivery sheath for a retrievable Bard Denali filter was passed over the Bentson wire into the IVC. The wire was removed and small contrast was used to confirm IVC location. CO2 IVC cavagram performed. Dilator was removed, and the IVC filter was then delivered, positioned below the lowest renal vein. Catheter was removed. Manual pressure was used for hemostasis. Patient tolerated the procedure well and remained hemodynamically stable throughout. No complications were encountered and no significant blood loss was encounter. COMPLICATIONS: None. FINDINGS: CO2 IVC venography demonstrates a normal caliber IVC with no evidence of thrombus. Renal veins are identified bilaterally. The IVC filter was successfully positioned below the  level of the renal veins and is appropriately oriented. This IVC filter has both permanent and retrievable indications. IMPRESSION: Status post placement of retrievable IVC filter. This IVC filter is potentially retrievable. The patient can be assessed for filter retrieval by Interventional Radiology  in approximately 8-12 weeks if the patient becomes eligible for anti coagulation. Signed, Dulcy Fanny. Earleen Newport, DO [facility] Electronically Signed   By: Corrie Mckusick D.O.   On: 03/16/2015 18:24   Ir Fluoro Guide Cv Line Right  02/16/2015  CLINICAL DATA:  81 year old male admitted with acute renal failure. He has been referred for evaluation for hemodialysis catheter placement. Catheter placement is indicated. EXAM: IR RIGHT FLOURO GUIDE CV LINE; IR ULTRASOUND GUIDANCE VASC ACCESS RIGHT Date: 02/16/2015 ANESTHESIA/SEDATION: Moderate (conscious) sedation was administered during this procedure. A total of 0.5 mg Versed and 25 mg Fentanyl were administered intravenously. The patient's vital signs were monitored continuously by radiology nursing throughout the course of the procedure. Total sedation time: 11 minutes FLUOROSCOPY TIME:  18 seconds TECHNIQUE: The procedure, risks, benefits, and alternatives were explained to the patient. Questions regarding the procedure were encouraged and answered. The patient understands and consents to the procedure. The right neck and chest was prepped with chlorhexidine, and draped in the usual sterile fashion using maximum barrier technique (cap and mask, sterile gown, sterile gloves, large sterile sheet, hand hygiene and cutaneous antiseptic). Local anesthesia was attained by infiltration with 1% lidocaine with epinephrine. Ultrasound demonstrated patency of the right internal jugular vein, and this was documented with an image. Under real-time ultrasound guidance, this vein was accessed with a 21 gauge micropuncture needle and image documentation was performed. A small dermatotomy was made at the access site with an 11 scalpel. A 0.018" wire was advanced into the SVC and the access needle exchanged for a 77F micropuncture vascular sheath. The 0.018" wire was then removed and a 0.035" wire advanced into the IVC. Dilation of  the tissue tract was performed with a 10 French tissue dilator. A 20 cm triple-lumen trialysis catheter was then placed using Seldinger technique, with the tip terminating at the superior cavoatrial junction. Wire was removed.  The catheter was sutured in position. Patient tolerated the procedure well and remained hemodynamically stable throughout. No complications were encountered and no significant blood loss was encountered. COMPLICATIONS: None IMPRESSION: Status post placement of right IJ temporary hemodialysis catheter. Catheter ready for use. Signed, Dulcy Fanny. Earleen Newport, DO [facility] Rad[facility] Electronically Signed   By: Corrie Mckusick D.O.   On: 02/16/2015 13:16   Ir US Guide Vasc Access Right  02/16/2015  CLINICAL DATA:  81 year old male admitted with acute renal failure. He has been referred for evaluation for hemodialysis catheter placement. Catheter placement is indicated. EXAM: IR RIGHT FLOURO GUIDE CV LINE; IR ULTRASOUND GUIDANCE VASC ACCESS RIGHT Date: 02/16/2015 ANESTHESIA/SEDATION: Moderate (conscious) sedation was administered during this procedure. A total of 0.5 mg Versed and 25 mg Fentanyl were administered intravenously. The patient's vital signs were monitored continuously by radiology nursing throughout the course of the procedure. Total sedation time: 11 minutes FLUOROSCOPY TIME:  18 seconds TECHNIQUE: The procedure, risks, benefits, and alternatives were explained to the patient. Questions regarding the procedure were encouraged and answered. The patient understands and consents to the procedure. The right neck and chest was prepped with chlorhexidine, and draped in the usual sterile fashion using maximum barrier technique (cap and mask, sterile gown, sterile gloves, large sterile sheet, hand  hygiene and cutaneous antiseptic). Local anesthesia was attained by infiltration with 1% lidocaine with epinephrine. Ultrasound demonstrated patency of  the right internal jugular vein, and this was documented with an image. Under real-time ultrasound guidance, this vein was accessed with a 21 gauge micropuncture needle and image documentation was performed. A small dermatotomy was made at the access site with an 11 scalpel. A 0.018" wire was advanced into the SVC and the access needle exchanged for a 83F micropuncture vascular sheath. The 0.018" wire was then removed and a 0.035" wire advanced into the IVC. Dilation of the tissue tract was performed with a 10 French tissue dilator. A 20 cm triple-lumen trialysis catheter was then placed using Seldinger technique, with the tip terminating at the superior cavoatrial junction. Wire was removed.  The catheter was sutured in position. Patient tolerated the procedure well and remained hemodynamically stable throughout. No complications were encountered and no significant blood loss was encountered. COMPLICATIONS: None IMPRESSION: Status post placement of right IJ temporary hemodialysis catheter. Catheter ready for use. Signed, Dulcy Fanny. Earleen Newport, DO [facility] Electronically Signed   By: Corrie Mckusick D.O.   On: 02/16/2015 13:16   Dg Chest Port 1 View  03/14/2015  CLINICAL DATA:  Shortness of breath EXAM: PORTABLE CHEST 1 VIEW COMPARISON:  02/15/2015 chest radiograph. FINDINGS: Stable cardiomediastinal silhouette with mild cardiomegaly. No pneumothorax. Possible stable trace right pleural effusion. No appreciable left pleural effusion. No overt pulmonary edema. Stable elevation of the right hemidiaphragm. Stable right basilar curvilinear opacities, favor atelectasis. No new focal lung opacity. IMPRESSION: 1. Stable mild cardiomegaly without overt pulmonary edema. 2. Stable elevation of the right hemidiaphragm with likely stable trace right pleural effusion. 3. Stable curvilinear right basal lung opacities, favor atelectasis. No new focal lung opacity.  Electronically Signed   By: Ilona Sorrel M.D.   On: 03/14/2015 13:18   Dg Abd 2 Views  02/20/2015  CLINICAL DATA:  Abdominal pain, follow-up ileus, status post nephrectomy EXAM: ABDOMEN - 2 VIEW COMPARISON:  02/19/2015 FINDINGS: Mild gaseous distention of small and large bowel, compatible with history of postoperative adynamic ileus. No evidence of bowel obstruction. Left percutaneous nephrostomy. Degenerative changes the lumbar spine. IMPRESSION: No evidence of bowel obstruction. Mild gaseous distention of small and large bowel, compatible with history of postoperative adynamic ileus. Electronically Signed   By: Julian Hy M.D.   On: 02/20/2015 12:28   Dg Abd Portable 1v  02/19/2015  CLINICAL DATA:  Abdominal pain, nausea, constipation EXAM: PORTABLE ABDOMEN - 1 VIEW COMPARISON:  None. FINDINGS: Left nephrostomy catheter is noted. Gaseous distended small bowel loops on mid abdomen highly suspicious for significant ileus or partial small bowel obstruction. Stool and gas noted in distal colon. IMPRESSION: Gaseous distended small bowel loops mid abdomen highly suspicious for significant ileus or bowel obstruction. Electronically Signed   By: Lahoma Crocker M.D.   On: 02/19/2015 14:10   Ir Nephrostomy Placement Left  02/16/2015  CLINICAL DATA:  81 year old male admitted with acute renal failure. Patient never has had dialysis. He has evidence on imaging of mild pelvicaliectasis with no frank hydronephrosis. CT demonstrates a distal left ureteral stone and minimal ureteral dilation. He has been referred for evaluation for urinary diversion, as a component of acute renal failure may be related to an obstruction. Drain catheter placement is indicated. EXAM: IR NEPHROSTOMY PLACEMENT LEFT COMPARISON:  None. ANESTHESIA/SEDATION: 2.0 IV Versed; 25 mcg IV Fentanyl. Total Moderate Sedation Time: 72 minutes. CONTRAST:  42mL  OMNIPAQUE IOHEXOL 300 MG/ML  SOLN MEDICATIONS: 400 mg IV Cipro FLUOROSCOPY TIME:  12  minutes, 36 seconds TECHNIQUE: The procedure, risks, benefits, and alternatives were explained to the patient and the patient's family. Questions regarding the procedure were encouraged and answered. The patient understands and consents to the procedure. The bilateral flanks were prepped with chlorhexidine in a sterile fashion, and a sterile drape was applied covering the operative field. A sterile gown and sterile gloves were used for the procedure. Local anesthesia was provided with 1% Lidocaine. Ultrasound survey of both the left and right kidney was performed with images stored and sent to PACs. Left: Left kidney was then identified, without significant hydronephrosis. Ultrasound guidance was used to direct a 22 gauge coaxial needle into the interpolar region of the kidney. Once the tip of the needle was withdrawn into the collecting system, this access was used to opacify the renal collecting system with contrast. A double stick technique was then used after opacifying the collecting system to select a lower pole posterior calyx for a second stick. A 22 gauge coaxial needle was then directed under fluoroscopy into this lower pole posterior calyx. A Nitrex wire was navigated through the infundibulum and collecting system into the ureter, and then using modified Seldinger technique, an Accustick system, and a 10 [place] dilator, a 10 French pigtail drainage catheter was placed into the renal pelvis. Near the conclusion of successful placement of the left-sided percutaneous nephrostomy tube, the patient was becoming restless and agitated. Further attempt at a right-sided percutaneous nephrostomy tube was deemed unsafe. Patient remained hemodynamically stable throughout. No complications encountered. COMPLICATIONS: None FINDINGS: Ultrasound survey of the left kidney demonstrates minimal pelvicaliectasis. Ultrasound survey of the right kidney demonstrates no hydronephrosis, and no significant pelvicaliectasis. Images  during the case demonstrate placement of left-sided percutaneous nephrostomy tube into a posterior inferior calyx. No significant hydronephrosis with infusion of contrast. IMPRESSION: Status post placement of left-sided percutaneous nephrostomy tube. Signed, Dulcy Fanny. Earleen Newport, DO [facility] Radiology PLAN: Given that the patient's agitation and discomfort precluded safe attempt at placement of a right-sided percutaneous nephrostomy tube, further attempts may be reconsidered after interval medical therapy. Observation of urine clearance from the left percutaneous nephrostomy tube. Electronically Signed   By: Corrie Mckusick D.O.   On: 02/16/2015 13:29     CBC  Recent Labs Lab 03/14/15 1220 03/15/15 0334 03/16/15 0334 03/17/15 0309  WBC 13.6* 15.1* 12.9* 13.7*  HGB 13.5 12.3* 11.3* 12.1*  HCT 41.9 38.2* 35.6* 37.6*  PLT 125* 150 137* 186  MCV 92.3 92.7 93.4 93.5  MCH 29.7 29.9 29.7 30.1  MCHC 32.2 32.2 31.7 32.2  RDW 14.2 14.2 14.4 14.4  LYMPHSABS 1.3  --   --   --   MONOABS 0.9  --   --   --   EOSABS 0.1  --   --   --   BASOSABS 0.0  --   --   --     Chemistries   Recent Labs Lab 03/14/15 1220 03/15/15 0334 03/16/15 0334 03/17/15 0309  NA 136 138 140 140  K 4.1 4.5 4.5 4.3  CL 102 106 107 108  CO2 22 23 25 23   GLUCOSE 203* 107* 97 87  BUN 23* 19 24* 19  CREATININE 1.94* 2.03* 2.11* 1.76*  CALCIUM 8.2* 8.1* 8.0* 8.2*  MG  --  1.9  --   --   AST  --   --  16  --  ALT  --   --  20  --   ALKPHOS  --   --  96  --   BILITOT  --   --  0.6  --    ------------------------------------------------------------------------------------------------------------------ estimated creatinine clearance is 37.7 mL/min (by C-G formula based on Cr of 1.76). ------------------------------------------------------------------------------------------------------------------  Recent Labs  03/14/15 1232  HGBA1C 7.1*    ------------------------------------------------------------------------------------------------------------------ No results for input(s): CHOL, HDL, LDLCALC, TRIG, CHOLHDL, LDLDIRECT in the last 72 hours. ------------------------------------------------------------------------------------------------------------------ No results for input(s): TSH, T4TOTAL, T3FREE, THYROIDAB in the last 72 hours.  Invalid input(s): FREET3 ------------------------------------------------------------------------------------------------------------------ No results for input(s): VITAMINB12, FOLATE, FERRITIN, TIBC, IRON, RETICCTPCT in the last 72 hours.  Coagulation profile  Recent Labs Lab 03/17/15 0309  INR 1.23     Recent Labs  03/14/15 1220  DDIMER >20.00*    Cardiac Enzymes  Recent Labs Lab 03/14/15 2140 03/15/15 0334 03/16/15 0334  TROPONINI 0.72* 0.53* 0.25*   ------------------------------------------------------------------------------------------------------------------ Invalid input(s): POCBNP   CBG:  Recent Labs Lab 03/16/15 0749 03/16/15 1128 03/16/15 1810 03/16/15 2154 03/17/15 0729  GLUCAP 98 147* 162* 81 80       Studies: Ir Ivc Filter Plmt / S&i /img Guid/mod Sed  03/16/2015  CLINICAL DATA:  81 year old male with a history of bilateral renal obstruction secondary to carcinoma. He has developed right lower extremity DVT with hematuria and is not a candidate for anti coagulation. EXAM: 1. ULTRASOUND GUIDANCE FOR VASCULAR ACCESS OF THE RIGHT INTERNAL JUGULAR VEIN. 2. IVC VENOGRAM. 3. PERCUTANEOUS IVC FILTER PLACEMENT. ANESTHESIA/SEDATION: None CONTRAST:  CO2 contrast FLUOROSCOPY TIME:  One minutes. PROCEDURE: The procedure, risks, benefits, and alternatives were explained to the patient. Specific risks discussed include bleeding, infection, contrast reaction, renal failure, IVC filter fracture, migration, ileo caval thrombus (3% incidence), need for further  procedure, need for further surgery, pulmonary embolism, cardiopulmonary collapse, death. Questions regarding the procedure were encouraged and answered. The patient understands and consents to the procedure. Ultrasound survey was performed with images stored and sent to PACs. The neck was prepped with Betadine in a sterile fashion, and a sterile drape was applied covering the operative field. A sterile gown and sterile gloves were used for the procedure. Local anesthesia was provided with 1% Lidocaine. A micropuncture needle was used access the right internal jugular vein under ultrasound. With excellent venous blood flow returned, and an .018 micro wire was passed through the needle. Small incision was made with an 11 blade scalpel. The needle was removed, and a micropuncture sheath was placed over the wire. The inner dilator and wire were removed, and an 035 Bentson wire was advanced under fluoroscopy into the IVC. Serial dilation of the soft tissue tract was performed with an 8 [place] dilator and subsequently a 10 [place] dilator. The delivery sheath for a retrievable Bard Denali filter was passed over the Bentson wire into the IVC. The wire was removed and small contrast was used to confirm IVC location. CO2 IVC cavagram performed. Dilator was removed, and the IVC filter was then delivered, positioned below the lowest renal vein. Catheter was removed. Manual pressure was used for hemostasis. Patient tolerated the procedure well and remained hemodynamically stable throughout. No complications were encountered and no significant blood loss was encounter. COMPLICATIONS: None. FINDINGS: CO2 IVC venography demonstrates a normal caliber IVC with no evidence of thrombus. Renal veins are identified bilaterally. The IVC filter was successfully positioned below the level of the renal veins and is appropriately oriented. This IVC filter has both permanent and retrievable indications.  IMPRESSION: Status post placement of  retrievable IVC filter. This IVC filter is potentially retrievable. The patient can be assessed for filter retrieval by Interventional Radiology in approximately 8-12 weeks if the patient becomes eligible for anti coagulation. Signed, Dulcy Fanny. Earleen Newport, DO [facility] Radiology Electronically Signed   By: Corrie Mckusick D.O.   On: 03/16/2015 18:24      Lab Results  Component Value Date   HGBA1C 7.1* 03/14/2015   HGBA1C 6.5* 05/15/2014   Lab Results  Component Value Date   LDLCALC 117* 10/24/2010   CREATININE 1.76* 03/17/2015       Scheduled Meds: . amiodarone  200 mg Oral BID  . cefTRIAXone (ROCEPHIN)  IV  1 g Intravenous Q24H  . diltiazem  60 mg Oral 3 times per day  . docusate sodium  100 mg Oral BID  . insulin aspart  0-9 Units Subcutaneous TID WC  . multivitamin with minerals  1 tablet Oral Daily  . polyethylene glycol  17 g Oral Daily  . predniSONE  7.5 mg Oral Q breakfast  . senna  1 tablet Oral Daily  . sodium chloride  3 mL Intravenous Q12H  . sodium chloride  3 mL Intravenous Q12H   Continuous Infusions:    Principal Problem:   Atrial fibrillation with RVR (HCC) Active Problems:   HTN (hypertension)   Urothelial carcinoma (HCC)   Rheumatoid arthritis (HCC)   Atrial flutter with rapid ventricular response ([place])   Acute on chronic renal failure ([place])   Hematuria    Time spent: 25 minutes   [place] [facility] Pager [phone]. If 7PM-7AM, please contact night-coverage at [email or web address], password [facility] 03/17/2015, 10:08 AM  LOS: 3 days

## 2015-03-17 NOTE — Progress Notes (Signed)
Pt selected to continue with [facility] for HHPT/RN. Referral given to in house rep.

## 2015-03-17 NOTE — Progress Notes (Signed)
Subjective:  Patient denies any chest pain but complains of shortness of breath.  Denies palpitation.  Remains in sinus rhythm.  Objective:  Vital Signs in the last 24 hours: Temp:  [97.9 F (36.6 C)-99.2 F (37.3 C)] 98.6 F (37 C) (11/23 1242) Pulse Rate:  [46-119] 97 (11/23 1242) Resp:  [14-24] 18 (11/23 1200) BP: (119-188)/(52-111) 148/96 mmHg (11/23 1242) SpO2:  [94 %-99 %] 99 % (11/23 1242) Weight:  [92.7 kg (204 lb 5.9 oz)] 92.7 kg (204 lb 5.9 oz) (11/23 0548)  Intake/Output from previous day: 11/22 0701 - 11/23 0700 In: 1308 [I.V.:1308] Out: 2326 [Urine:2325; Stool:1] Intake/Output from this shift: Total I/O In: 3 [I.V.:3] Out: 425 [Urine:425]  Physical Exam: Neck: no adenopathy, no carotid bruit, no JVD and supple, symmetrical, trachea midline Lungs: clear to auscultation bilaterally Heart: regular rate and rhythm, S1, S2 normal and soft systolic murmur noted Abdomen: soft, non-tender; bowel sounds normal; no masses,  no organomegaly Extremities: extremities normal, atraumatic, no cyanosis or edema  Lab Results:  Recent Labs  03/16/15 0334 03/17/15 0309  WBC 12.9* 13.7*  HGB 11.3* 12.1*  PLT 137* 186    Recent Labs  03/16/15 0334 03/17/15 0309  NA 140 140  K 4.5 4.3  CL 107 108  CO2 25 23  GLUCOSE 97 87  BUN 24* 19  CREATININE 2.11* 1.76*    Recent Labs  03/15/15 0334 03/16/15 0334  TROPONINI 0.53* 0.25*   Hepatic Function Panel  Recent Labs  03/16/15 0334  PROT 5.3*  ALBUMIN 2.4*  AST 16  ALT 20  ALKPHOS 96  BILITOT 0.6   No results for input(s): CHOL in the last 72 hours. No results for input(s): PROTIME in the last 72 hours.  Imaging: Imaging results have been reviewed and Ir Ivc Filter Plmt / S&i /img Guid/mod Sed  03/16/2015  CLINICAL DATA:  81 year old male with a history of bilateral renal obstruction secondary to carcinoma. He has developed right lower extremity DVT with hematuria and is not a candidate for anti  coagulation. EXAM: 1. ULTRASOUND GUIDANCE FOR VASCULAR ACCESS OF THE RIGHT INTERNAL JUGULAR VEIN. 2. IVC VENOGRAM. 3. PERCUTANEOUS IVC FILTER PLACEMENT. ANESTHESIA/SEDATION: None CONTRAST:  CO2 contrast FLUOROSCOPY TIME:  One minutes. PROCEDURE: The procedure, risks, benefits, and alternatives were explained to the patient. Specific risks discussed include bleeding, infection, contrast reaction, renal failure, IVC filter fracture, migration, ileo caval thrombus (3% incidence), need for further procedure, need for further surgery, pulmonary embolism, cardiopulmonary collapse, death. Questions regarding the procedure were encouraged and answered. The patient understands and consents to the procedure. Ultrasound survey was performed with images stored and sent to PACs. The neck was prepped with Betadine in a sterile fashion, and a sterile drape was applied covering the operative field. A sterile gown and sterile gloves were used for the procedure. Local anesthesia was provided with 1% Lidocaine. A micropuncture needle was used access the right internal jugular vein under ultrasound. With excellent venous blood flow returned, and an .018 micro wire was passed through the needle. Small incision was made with an 11 blade scalpel. The needle was removed, and a micropuncture sheath was placed over the wire. The inner dilator and wire were removed, and an 035 Bentson wire was advanced under fluoroscopy into the IVC. Serial dilation of the soft tissue tract was performed with an 8 [place] dilator and subsequently a 10 [place] dilator. The delivery sheath for a retrievable Bard Denali filter was passed over the Bentson wire into the IVC.  The wire was removed and small contrast was used to confirm IVC location. CO2 IVC cavagram performed. Dilator was removed, and the IVC filter was then delivered, positioned below the lowest renal vein. Catheter was removed. Manual pressure was used for hemostasis. Patient tolerated the procedure  well and remained hemodynamically stable throughout. No complications were encountered and no significant blood loss was encounter. COMPLICATIONS: None. FINDINGS: CO2 IVC venography demonstrates a normal caliber IVC with no evidence of thrombus. Renal veins are identified bilaterally. The IVC filter was successfully positioned below the level of the renal veins and is appropriately oriented. This IVC filter has both permanent and retrievable indications. IMPRESSION: Status post placement of retrievable IVC filter. This IVC filter is potentially retrievable. The patient can be assessed for filter retrieval by Interventional Radiology in approximately 8-12 weeks if the patient becomes eligible for anti coagulation. Signed, Dulcy Fanny. Earleen Newport, DO [facility] Radiology Electronically Signed   By: Corrie Mckusick D.O.   On: 03/16/2015 18:24    Cardiac Studies:  Assessment/Plan:  Chronic dyspnea, rule out  PE Status post a flutter with 2 to one block, converted to sinus rhythm. Status post small non-Q-wave myocardial infarction due to elevated troponin I probably secondary to demand ischemia. Hypertension. Metastatic bladder cancer. Status post left ureteral obstruction/left nephrostomy tube. Right hydronephrosis Chronic kidney disease. Rheumatoid arthritis. Benign hypertrophy of prostate History of peptic ulcer disease. Bilateral DVT Plan Continue present management. Increase Cardizem as per orders. Will get VQ scan, although patient is not a candidate for anticoagulation at present.  If positive, may need aggressive evaluation for hematuria  LOS: 3 days    Charolette Forward 03/17/2015, 2:03 PM

## 2015-03-17 NOTE — Progress Notes (Signed)
OT Cancellation Note  Patient Details Name: Craig Waters. MRN: [id number] DOB: 01/21/1931   Cancelled Treatment:    Reason Eval/Treat Not Completed: Other (comment).  Pt is going to have VQ scan.  Will check back.  Katalina Magri 03/17/2015, 2:59 PM  Lesle Chris, OTR/L [phone] 03/17/2015

## 2015-03-17 NOTE — Care Management Note (Signed)
Case Management Note  Patient Details  Name: Blu Hewatt. MRN: [id number] Date of Birth: 1931/04/11  Subjective/Objective:                    Action/Plan:  Home with [facility]   Expected Discharge Date:   (UNKNOWN)               Expected Discharge Plan:  [place]  In-House Referral:  NA  Discharge planning Services  CM Consult  Post Acute Care Choice:  NA Choice offered to:  NA  DME Arranged:    DME Agency:     HH Arranged:  RN, PT, Nurse's Aide [place] Agency:  [facility]  Status of Service:  In process, will continue to follow  Medicare Important Message Given:    Date Medicare IM Given:    Medicare IM give by:    Date Additional Medicare IM Given:    Additional Medicare Important Message give by:     If discussed at [place] of Stay Meetings, dates discussed:    Additional CommentsPurcell Mouton, RN 03/17/2015, 3:13 PM

## 2015-03-17 NOTE — Evaluation (Signed)
Physical Therapy Evaluation Patient Details Name: Craig Waters. MRN: [id number] DOB: 03/04/1931 Today's Date: 03/17/2015   History of Present Illness  Kastyn Steltenpohl. is a 81 y.o. male a PMH of rheumatoid arthritisy, high-grade urothelial cell carcinoma, recently admitted to hospital 10-31: for AKI, due to obstructive uropathy, had nephrostomy tube place. presents to Ed with SOB, aa fib with RVR.  Clinical Impression  Pt admitted with above diagnosis. Pt currently with functional limitations due to the deficits listed below (see PT Problem List). Pt will benefit from skilled PT to increase their independence and safety with mobility to allow discharge to home.     Follow Up Recommendations Home health PT;Supervision/Assistance - 24 hour    Equipment Recommendations  None recommended by PT    Recommendations for Other Services       Precautions / Restrictions Precautions Precautions: Fall Precaution Comments: Nephrostomy drain on L, monitor VS, on oxygen      Mobility  Bed Mobility Overal bed mobility: Needs Assistance Bed Mobility: Supine to Sit     Supine to sit: Min assist     General bed mobility comments: gentle support of  UE to sit upright.  Transfers Overall transfer level: Needs assistance Equipment used: Rolling walker (2 wheeled) Transfers: Sit to/from Stand Sit to Stand: Min assist         General transfer comment: cues for hand placement.   Ambulation/Gait Ambulation/Gait assistance: Min assist;+2 safety/equipment Ambulation Distance (Feet): 80 Feet Assistive device: Rolling walker (2 wheeled) Gait Pattern/deviations: Step-through pattern     General Gait Details: slow, decreased stride  Stairs            Wheelchair Mobility    Modified Rankin (Stroke Patients Only)       Balance Overall balance assessment: Needs assistance Sitting-balance support: Feet supported;No upper extremity supported Sitting balance-Leahy Scale:  Fair     Standing balance support: Bilateral upper extremity supported;During functional activity Standing balance-Leahy Scale: Fair                               Pertinent Vitals/Pain Pain Assessment: Faces Faces Pain Scale: Hurts even more Pain Location: back Pain Descriptors / Indicators: Aching;Cramping;Discomfort Pain Intervention(s): Limited activity within patient's tolerance;Monitored during session;Repositioned    Home Living Family/patient expects to be discharged to:: Private residence Living Arrangements: Children Available Help at Discharge: Family Type of Home: House Home Access: Level entry     Home Layout: One level Home Equipment: Environmental consultant - 2 wheels;Cane - single point;Bedside commode;Shower seat;Hospital bed      Prior Function Level of Independence: Independent         Comments: has been having  HHPT     Hand Dominance        Extremity/Trunk Assessment   Upper Extremity Assessment: Defer to OT evaluation           Lower Extremity Assessment: Generalized weakness         Communication   Communication: No difficulties  Cognition Arousal/Alertness: Awake/alert Behavior During Therapy: WFL for tasks assessed/performed Overall Cognitive Status: Within Functional Limits for tasks assessed                      General Comments      Exercises        Assessment/Plan    PT Assessment Patient needs continued PT services  PT Diagnosis Difficulty walking;Generalized weakness   PT Problem List  Decreased strength;Decreased activity tolerance;Decreased balance;Decreased mobility;Decreased knowledge of use of DME;Decreased safety awareness  PT Treatment Interventions DME instruction;Gait training;Functional mobility training;Therapeutic activities;Therapeutic exercise;Patient/family education   PT Goals (Current goals can be found in the Care Plan section) Acute Rehab PT Goals Patient Stated Goal: home and get  stronger PT Goal Formulation: With patient Time For Goal Achievement: 03/31/15 Potential to Achieve Goals: Good    Frequency Min 3X/week   Barriers to discharge        Co-evaluation               End of Session   Activity Tolerance: Patient tolerated treatment well Patient left: in chair;with call bell/phone within reach;with chair alarm set Nurse Communication: Mobility status         Time: [id number] PT Time Calculation (min) (ACUTE ONLY): 31 min   Charges:   PT Evaluation $Initial PT Evaluation Tier I: 1 Procedure PT Treatments $Gait Training: 8-22 mins   PT G Codes:        Claretha Cooper 03/17/2015, 10:44 AM Tresa Endo PT [phone]

## 2015-03-18 LAB — GLUCOSE, CAPILLARY
GLUCOSE-CAPILLARY: 116 mg/dL — AB (ref 65–99)
GLUCOSE-CAPILLARY: 162 mg/dL — AB (ref 65–99)
GLUCOSE-CAPILLARY: 117 mg/dL — AB (ref 65–99)
GLUCOSE-CAPILLARY: 149 mg/dL — AB (ref 65–99)
Glucose-Capillary: 86 mg/dL (ref 65–99)

## 2015-03-18 MED ORDER — AMIODARONE HCL 200 MG PO TABS
400.0000 mg | ORAL_TABLET | Freq: Two times a day (BID) | ORAL | Status: DC
  Administered 2015-03-18 – 2015-03-20 (×5): 400 mg via ORAL
  Filled 2015-03-18 (×5): qty 2

## 2015-03-18 NOTE — Progress Notes (Signed)
[facility] PROGRESS NOTE  Craig Waters. LR:[id number] DOB: 1930-07-18 DOA: 03/14/2015 PCP: [name], MD    Assessment/Plan: Principal Problem:   Atrial fibrillation with RVR ([place]) Active Problems:   HTN (hypertension)   Urothelial carcinoma (HCC)   Rheumatoid arthritis (HCC)   Atrial flutter with rapid ventricular response (HCC)   Acute on chronic renal failure (HCC)   Hematuria    Brief summary Craig Waters. is a 81 y.o. male a PMH of rheumatoid arthritis on chronic prednisone and other immunosuppressive therapy, high-grade urothelial cell carcinoma, under the care of Dr. [name] at [facility] ([place]), recently admitted to hospital 10-31: for AKI, due to obstructive uropathy, had nephrostomy tube place   Patient presents with increase SOB, palpitation. He relates dyspnea for months but worse for last 2 days. He denies chest pain. He has not had surgery for urothelial cancer done. His urine continue to be bloody. He has left side nephrostomy tube in place. He was started on bactrim on friday for possible UTI, by his PCP.   Evaluation in the ED; He was found to be in A fib RVR. Chest x ray: stable cardiomegaly, mild elevated troponin at 0.50  Assessment and plan 1-A fib RVR; initially started on cardizem gtt , later on changed to po cardizem, and resume the amiodarone. Rate better controlled, increased the dose of cardizem and amiodarone. Appreciate cardiology recommendations Patient continues to be normal sinus rhythm. D-dimer elevated, and he was found to have DVT,  IVC filter placed ON 11/22.  Patient may also have pulmonary embolism but Not a  candidate for anticoagulation/antiplatelet therapy because of hematuria.  He underwent V/Q SCAN which was intermediate probability for PE.    2-Mild elevated troponin:  In setting of A fib RVR. Cardiology following.  Denies active chest pain.  Recent 2-D echo on 02/17/15 with an EF of 65-70%, no regional wall  motion abnormalities were identified.  No further interventions.   3-Acute on chronic renal failure: Stage III, baseline 1.5, H/O Urothelia cancer, bilateral hydronephrosis, left side nephrostomy in place.  Renal function improving, multifactorial, possibly from dehydration and UTI. Left nephrostomy catheter seems to be draining, no hydronephrosis on the left. Right-sided hydro-ureter nephrosis, urology following. According to Urology no indication for acute intervention during this hospitalization. Patient to follow-up with his urologist at [facility]. His most of the urine output is from the left nephrostomy tube and minimal output from the foley catheter and is bloody, which the patient says is normal.     4-Hyperglycemia; SSI. Hemoglobin A1c 7.1 CBG (last 3)   Recent Labs  03/17/15 2200 03/18/15 0739 03/18/15 1213  GLUCAP 86 116* 162*   His Cbg'S are much better.     5-Recent UTI; continue IV ceftriaxone. Urine culture shows multiple morphologies. Repeat UA ordered.   6. Metastatic bladder cancer-Known high grade bladder cancer s/p palliative resections at [facility] by Dr. [name].Last seen there 11/3 with planned palliative resection next month. CT 01/2015 with liver lesion and Lt pelvic adenopathy concerning for likely metastatic disease.    7. Constipation- resolved. Had BM .   8. Hypoxia: Plan to wean him off oxygen in the next 24 hours.    DVT prophylaxsis SCDs  Code Status:      Code Status Orders        Start     Ordered   03/14/15 1626  Full code   Continuous     03/14/15 1626  Family Communication: family updated about patient's clinical progress Disposition Plan:  Anticipate discharge when hematuria improves     Consultants:  Neurology  Cardiology    Procedures:  None  Antibiotics: Anti-infectives    Start     Dose/Rate Route Frequency Ordered Stop   03/14/15 1545  cefTRIAXone (ROCEPHIN) 1 g in dextrose 5 % 50 mL IVPB     1  g 100 mL/hr over 30 Minutes Intravenous Every 24 hours 03/14/15 1538           HPI/Subjective: Wants to know if he needs any urology interventions.   Objective: Filed Vitals:   03/17/15 2345 03/18/15 0535 03/18/15 0657 03/18/15 1244  BP: 132/80 145/58  138/67  Pulse: 91 99    Temp:  99.2 F (37.3 C)    TempSrc:  Oral    Resp:  16    Height:      Weight:   94.167 kg (207 lb 9.6 oz)   SpO2:  97%      Intake/Output Summary (Last 24 hours) at 03/18/15 1615 Last data filed at 03/18/15 0854  Gross per 24 hour  Intake    110 ml  Output   1076 ml  Net   -966 ml    Exam:  General: No acute respiratory distress Lungs: Clear to auscultation bilaterally without wheezes or crackles Cardiovascular: Regular rate and rhythm without murmur gallop or rub normal S1 and S2 Abdomen: Nontender, nondistended, soft, bowel sounds positive, no rebound, no ascites, no appreciable mass Extremities: No significant cyanosis, clubbing, or edema bilateral lower extremities     Data Review   Micro Results Recent Results (from the past 240 hour(s))  Urine culture     Status: None   Collection Time: 03/15/15  5:33 AM  Result Value Ref Range Status   Specimen Description URINE, CATHETERIZED  Final   Special Requests NONE  Final   Culture   Final    MULTIPLE SPECIES PRESENT, SUGGEST RECOLLECTION Performed at [facility]    Report Status 03/16/2015 FINAL  Final    Radiology Reports Ct Abdomen Pelvis Wo Contrast  03/14/2015  CLINICAL DATA:  Acute on chronic renal failure. Prostatectomy. Hypertension. Uroepithelial cancer diagnosed 2/16. Left nephrostomy tube. Possible urinary tract infection. EXAM: CT ABDOMEN AND PELVIS WITHOUT CONTRAST TECHNIQUE: Multidetector CT imaging of the abdomen and pelvis was performed following the standard protocol without IV contrast. COMPARISON:  Plain films 02/20/2015.  Most recent CT 02/15/2015. FINDINGS: Lower chest: Volume loss with probable scarring  at the anterior left lung base. Mild cardiomegaly with LAD coronary artery atherosclerosis. Right pulmonary artery enlargement at 3.5 cm (image 1, series 2). Right hemidiaphragm elevation. Hepatobiliary: Suboptimal evaluation of the hypo attenuating posterior right hepatic lobe lesion secondary lack of IV contrast. Measures on the order of 4.2 cm in anterior posterior dimension today versus similar (when remeasured). No definite new liver lesions identified. Normal gallbladder, without biliary ductal dilatation. Pancreas: Mild pancreatic atrophy, within normal variation for age. No duct dilatation. Spleen: Normal in size, without focal abnormality. Adrenals/Urinary Tract: Normal adrenal glands. Low-density bilateral renal lesions are likely cysts. Bilateral renal cortical thinning. Placement of a left-sided nephrostomy catheter since the prior CT, with resolution of hydronephrosis. Mild right-sided hydroureteronephrosis is similar. Ureteric prominence followed to the level of the urinary bladder. Distal left ureteric stone is approximately 2 cm proximal to the bladder and measures 5 mm (image 86, series 2). Similar in position to on the prior. Bladder decompressed around a Foley catheter.  Soft tissue fullness throughout is consistent with the clinical history of bladder carcinoma. There is irregularity in the pericystic fat, suggesting direct tumor extension. Soft tissue density also extends anteriorly toward the median umbilical ligament (image 93, series 2). This is grossly similar and suspicious for tumor involvement. The fat plane between the seminal vesicles and posterior wall bladder is ill-defined on image 92. Soft tissue fullness in the region of the prostatectomy bed on image 97 may represent direct tumor extension. No pelvic sidewall extension. Stomach/Bowel: Normal stomach, without wall thickening. Normal colon and terminal ileum. Normal small bowel. Vascular/Lymphatic: Aortic and branch vessel  atherosclerosis. Non aneurysmal dilatation of the infrarenal aorta 2.5 cm. Similar small retroperitoneal nodes, without abdominal adenopathy. Left external iliac index node measures 10 mm on image 82 and is unchanged. Reproductive: prostatectomy. Soft tissue fullness within the prostatectomy bed is likely due to direct bladder extension. Other: No significant free fluid. Fat containing inguinal hernias are greater on the right. No evidence of omental or peritoneal disease. Musculoskeletal: Trace L3-4 anterolisthesis. Degenerative disc disease at this level. IMPRESSION: 1. Placement of a left nephrostomy catheter with resolution of left-sided hydronephrosis. Persistent right-sided hydroureteronephrosis, presumably secondary to bladder process. 2. Left distal ureteric stone, as before. 3. Bladder decompressed around a Foley catheter. Likely locally advanced bladder carcinoma with soft tissue fullness extending about the bladder. Similar mild left pelvic adenopathy, suspicious for nodal metastasis. 4. Grossly similar right hepatic lobe low-density lesion, suboptimally evaluated secondary to lack of contrast. 5. Pulmonary artery enlargement suggests pulmonary arterial hypertension. 6.  Atherosclerosis, including within the coronary arteries. Electronically Signed   By: Abigail Miyamoto M.D.   On: 03/14/2015 18:04   Dg Chest 2 View  03/17/2015  CLINICAL DATA:  Initial encounter for increasing shortness of breath and palpitation. Without overt EXAM: CHEST  2 VIEW COMPARISON:  03/14/2015. FINDINGS: AP and lateral views of the chest show low volumes with asymmetric elevation of the right hemidiaphragm, unchanged in the interval. No pulmonary edema or focal airspace consolidation. No pleural effusion. Cardiopericardial silhouette is at upper limits of normal for size. Imaged bony structures of the thorax are intact. Telemetry leads overlie the chest. IMPRESSION: Stable exam.  No acute cardiopulmonary findings. Electronically  Signed   By: Misty Stanley M.D.   On: 03/17/2015 16:55   Ir Ivc Filter Plmt / S&i /img Guid/mod Sed  03/16/2015  CLINICAL DATA:  81 year old male with a history of bilateral renal obstruction secondary to carcinoma. He has developed right lower extremity DVT with hematuria and is not a candidate for anti coagulation. EXAM: 1. ULTRASOUND GUIDANCE FOR VASCULAR ACCESS OF THE RIGHT INTERNAL JUGULAR VEIN. 2. IVC VENOGRAM. 3. PERCUTANEOUS IVC FILTER PLACEMENT. ANESTHESIA/SEDATION: None CONTRAST:  CO2 contrast FLUOROSCOPY TIME:  One minutes. PROCEDURE: The procedure, risks, benefits, and alternatives were explained to the patient. Specific risks discussed include bleeding, infection, contrast reaction, renal failure, IVC filter fracture, migration, ileo caval thrombus (3% incidence), need for further procedure, need for further surgery, pulmonary embolism, cardiopulmonary collapse, death. Questions regarding the procedure were encouraged and answered. The patient understands and consents to the procedure. Ultrasound survey was performed with images stored and sent to PACs. The neck was prepped with Betadine in a sterile fashion, and a sterile drape was applied covering the operative field. A sterile gown and sterile gloves were used for the procedure. Local anesthesia was provided with 1% Lidocaine. A micropuncture needle was used access the right internal jugular vein under ultrasound. With excellent venous blood flow  returned, and an .018 micro wire was passed through the needle. Small incision was made with an 11 blade scalpel. The needle was removed, and a micropuncture sheath was placed over the wire. The inner dilator and wire were removed, and an 035 Bentson wire was advanced under fluoroscopy into the IVC. Serial dilation of the soft tissue tract was performed with an 8 [place] dilator and subsequently a 10 [place] dilator. The delivery sheath for a retrievable Bard Denali filter was passed over the Bentson wire  into the IVC. The wire was removed and small contrast was used to confirm IVC location. CO2 IVC cavagram performed. Dilator was removed, and the IVC filter was then delivered, positioned below the lowest renal vein. Catheter was removed. Manual pressure was used for hemostasis. Patient tolerated the procedure well and remained hemodynamically stable throughout. No complications were encountered and no significant blood loss was encounter. COMPLICATIONS: None. FINDINGS: CO2 IVC venography demonstrates a normal caliber IVC with no evidence of thrombus. Renal veins are identified bilaterally. The IVC filter was successfully positioned below the level of the renal veins and is appropriately oriented. This IVC filter has both permanent and retrievable indications. IMPRESSION: Status post placement of retrievable IVC filter. This IVC filter is potentially retrievable. The patient can be assessed for filter retrieval by Interventional Radiology in approximately 8-12 weeks if the patient becomes eligible for anti coagulation. Signed, Dulcy Fanny. Earleen Newport, DO [facility] Radiology Electronically Signed   By: Corrie Mckusick D.O.   On: 03/16/2015 18:24   Nm Pulmonary Perf And Vent  03/17/2015  CLINICAL DATA:  Shortness of breath. EXAM: NUCLEAR MEDICINE VENTILATION - PERFUSION LUNG SCAN TECHNIQUE: Ventilation images were obtained in multiple projections using inhaled aerosol Tc-7m DTPA. Perfusion images were obtained in multiple projections after intravenous injection of Tc-68m MAA. RADIOPHARMACEUTICALS:  31.0 Technetium-23m DTPA aerosol inhalation and 4.1 Technetium-63m MAA IV COMPARISON:  Chest x-ray 03/14/2015. FINDINGS: Matching ventilation perfusion defects noted. Indeterminate scan for pulmonary embolus. Elevation right hemidiaphragm is again noted. IMPRESSION: Indeterminate for pulmonary embolus. Electronically Signed   By: Marcello Moores  Register   On: 03/17/2015 16:41   Dg  Chest Port 1 View  03/14/2015  CLINICAL DATA:  Shortness of breath EXAM: PORTABLE CHEST 1 VIEW COMPARISON:  02/15/2015 chest radiograph. FINDINGS: Stable cardiomediastinal silhouette with mild cardiomegaly. No pneumothorax. Possible stable trace right pleural effusion. No appreciable left pleural effusion. No overt pulmonary edema. Stable elevation of the right hemidiaphragm. Stable right basilar curvilinear opacities, favor atelectasis. No new focal lung opacity. IMPRESSION: 1. Stable mild cardiomegaly without overt pulmonary edema. 2. Stable elevation of the right hemidiaphragm with likely stable trace right pleural effusion. 3. Stable curvilinear right basal lung opacities, favor atelectasis. No new focal lung opacity. Electronically Signed   By: Ilona Sorrel M.D.   On: 03/14/2015 13:18   Dg Abd 2 Views  02/20/2015  CLINICAL DATA:  Abdominal pain, follow-up ileus, status post nephrectomy EXAM: ABDOMEN - 2 VIEW COMPARISON:  02/19/2015 FINDINGS: Mild gaseous distention of small and large bowel, compatible with history of postoperative adynamic ileus. No evidence of bowel obstruction. Left percutaneous nephrostomy. Degenerative changes the lumbar spine. IMPRESSION: No evidence of bowel obstruction. Mild gaseous distention of small and large bowel, compatible with history of postoperative adynamic ileus. Electronically Signed   By: Julian Hy M.D.   On: 02/20/2015 12:28   Dg Abd Portable 1v  02/19/2015  CLINICAL DATA:  Abdominal pain, nausea, constipation EXAM: PORTABLE ABDOMEN - 1 VIEW COMPARISON:  None. FINDINGS: Left nephrostomy catheter is noted. Gaseous distended small bowel loops on mid abdomen highly suspicious for significant ileus or partial small bowel obstruction. Stool and gas noted in distal colon. IMPRESSION: Gaseous distended small bowel loops mid abdomen highly suspicious for significant ileus or bowel obstruction. Electronically Signed   By: Lahoma Crocker M.D.   On: 02/19/2015 14:10      CBC  Recent Labs Lab 03/14/15 1220 03/15/15 0334 03/16/15 0334 03/17/15 0309  WBC 13.6* 15.1* 12.9* 13.7*  HGB 13.5 12.3* 11.3* 12.1*  HCT 41.9 38.2* 35.6* 37.6*  PLT 125* 150 137* 186  MCV 92.3 92.7 93.4 93.5  MCH 29.7 29.9 29.7 30.1  MCHC 32.2 32.2 31.7 32.2  RDW 14.2 14.2 14.4 14.4  LYMPHSABS 1.3  --   --   --   MONOABS 0.9  --   --   --   EOSABS 0.1  --   --   --   BASOSABS 0.0  --   --   --     Chemistries   Recent Labs Lab 03/14/15 1220 03/15/15 0334 03/16/15 0334 03/17/15 0309  NA 136 138 140 140  K 4.1 4.5 4.5 4.3  CL 102 106 107 108  CO2 22 23 25 23   GLUCOSE 203* 107* 97 87  BUN 23* 19 24* 19  CREATININE 1.94* 2.03* 2.11* 1.76*  CALCIUM 8.2* 8.1* 8.0* 8.2*  MG  --  1.9  --   --   AST  --   --  16  --   ALT  --   --  20  --   ALKPHOS  --   --  96  --   BILITOT  --   --  0.6  --    ------------------------------------------------------------------------------------------------------------------ estimated creatinine clearance is 37.9 mL/min (by C-G formula based on Cr of 1.76). ------------------------------------------------------------------------------------------------------------------ No results for input(s): HGBA1C in the last 72 hours. ------------------------------------------------------------------------------------------------------------------ No results for input(s): CHOL, HDL, LDLCALC, TRIG, CHOLHDL, LDLDIRECT in the last 72 hours. ------------------------------------------------------------------------------------------------------------------ No results for input(s): TSH, T4TOTAL, T3FREE, THYROIDAB in the last 72 hours.  Invalid input(s): FREET3 ------------------------------------------------------------------------------------------------------------------ No results for input(s): VITAMINB12, FOLATE, FERRITIN, TIBC, IRON, RETICCTPCT in the last 72 hours.  Coagulation profile  Recent Labs Lab 03/17/15 0309  INR 1.23    No  results for input(s): DDIMER in the last 72 hours.  Cardiac Enzymes  Recent Labs Lab 03/14/15 2140 03/15/15 0334 03/16/15 0334  TROPONINI 0.72* 0.53* 0.25*   ------------------------------------------------------------------------------------------------------------------ Invalid input(s): POCBNP   CBG:  Recent Labs Lab 03/17/15 1134 03/17/15 1711 03/17/15 2200 03/18/15 0739 03/18/15 1213  GLUCAP 189* 144* 86 116* 162*       Studies: Dg Chest 2 View  03/17/2015  CLINICAL DATA:  Initial encounter for increasing shortness of breath and palpitation. Without overt EXAM: CHEST  2 VIEW COMPARISON:  03/14/2015. FINDINGS: AP and lateral views of the chest show low volumes with asymmetric elevation of the right hemidiaphragm, unchanged in the interval. No pulmonary edema or focal airspace consolidation. No pleural effusion. Cardiopericardial silhouette is at upper limits of normal for size. Imaged bony structures of the thorax are intact. Telemetry leads overlie the chest. IMPRESSION: Stable exam.  No acute cardiopulmonary findings. Electronically Signed   By: Misty Stanley M.D.   On: 03/17/2015 16:55   Ir Ivc Filter Plmt / S&i /img Guid/mod Sed  03/16/2015  CLINICAL DATA:  81 year old male with a history of bilateral renal obstruction secondary to carcinoma. He has developed right lower  extremity DVT with hematuria and is not a candidate for anti coagulation. EXAM: 1. ULTRASOUND GUIDANCE FOR VASCULAR ACCESS OF THE RIGHT INTERNAL JUGULAR VEIN. 2. IVC VENOGRAM. 3. PERCUTANEOUS IVC FILTER PLACEMENT. ANESTHESIA/SEDATION: None CONTRAST:  CO2 contrast FLUOROSCOPY TIME:  One minutes. PROCEDURE: The procedure, risks, benefits, and alternatives were explained to the patient. Specific risks discussed include bleeding, infection, contrast reaction, renal failure, IVC filter fracture, migration, ileo caval thrombus (3% incidence), need for further procedure, need for further surgery, pulmonary  embolism, cardiopulmonary collapse, death. Questions regarding the procedure were encouraged and answered. The patient understands and consents to the procedure. Ultrasound survey was performed with images stored and sent to PACs. The neck was prepped with Betadine in a sterile fashion, and a sterile drape was applied covering the operative field. A sterile gown and sterile gloves were used for the procedure. Local anesthesia was provided with 1% Lidocaine. A micropuncture needle was used access the right internal jugular vein under ultrasound. With excellent venous blood flow returned, and an .018 micro wire was passed through the needle. Small incision was made with an 11 blade scalpel. The needle was removed, and a micropuncture sheath was placed over the wire. The inner dilator and wire were removed, and an 035 Bentson wire was advanced under fluoroscopy into the IVC. Serial dilation of the soft tissue tract was performed with an 8 [place] dilator and subsequently a 10 [place] dilator. The delivery sheath for a retrievable Bard Denali filter was passed over the Bentson wire into the IVC. The wire was removed and small contrast was used to confirm IVC location. CO2 IVC cavagram performed. Dilator was removed, and the IVC filter was then delivered, positioned below the lowest renal vein. Catheter was removed. Manual pressure was used for hemostasis. Patient tolerated the procedure well and remained hemodynamically stable throughout. No complications were encountered and no significant blood loss was encounter. COMPLICATIONS: None. FINDINGS: CO2 IVC venography demonstrates a normal caliber IVC with no evidence of thrombus. Renal veins are identified bilaterally. The IVC filter was successfully positioned below the level of the renal veins and is appropriately oriented. This IVC filter has both permanent and retrievable indications. IMPRESSION: Status post placement of retrievable IVC filter. This IVC filter is  potentially retrievable. The patient can be assessed for filter retrieval by Interventional Radiology in approximately 8-12 weeks if the patient becomes eligible for anti coagulation. Signed, Dulcy Fanny. Earleen Newport, DO [facility] Radiology Electronically Signed   By: Corrie Mckusick D.O.   On: 03/16/2015 18:24   Nm Pulmonary Perf And Vent  03/17/2015  CLINICAL DATA:  Shortness of breath. EXAM: NUCLEAR MEDICINE VENTILATION - PERFUSION LUNG SCAN TECHNIQUE: Ventilation images were obtained in multiple projections using inhaled aerosol Tc-62m DTPA. Perfusion images were obtained in multiple projections after intravenous injection of Tc-75m MAA. RADIOPHARMACEUTICALS:  31.0 Technetium-52m DTPA aerosol inhalation and 4.1 Technetium-74m MAA IV COMPARISON:  Chest x-ray 03/14/2015. FINDINGS: Matching ventilation perfusion defects noted. Indeterminate scan for pulmonary embolus. Elevation right hemidiaphragm is again noted. IMPRESSION: Indeterminate for pulmonary embolus. Electronically Signed   By: Marcello Moores  Register   On: 03/17/2015 16:41      Lab Results  Component Value Date   HGBA1C 7.1* 03/14/2015   HGBA1C 6.5* 05/15/2014   Lab Results  Component Value Date   LDLCALC 117* 10/24/2010   CREATININE 1.76* 03/17/2015       Scheduled Meds: . amiodarone  400 mg Oral BID  . cefTRIAXone (ROCEPHIN)  IV  1  g Intravenous Q24H  . diltiazem  60 mg Oral 4 times per day  . docusate sodium  100 mg Oral BID  . insulin aspart  0-9 Units Subcutaneous TID WC  . multivitamin with minerals  1 tablet Oral Daily  . polyethylene glycol  17 g Oral Daily  . predniSONE  7.5 mg Oral Q breakfast  . senna  1 tablet Oral Daily  . sodium chloride  3 mL Intravenous Q12H  . sodium chloride  3 mL Intravenous Q12H   Continuous Infusions:    Principal Problem:   Atrial fibrillation with RVR (HCC) Active Problems:   HTN (hypertension)   Urothelial carcinoma (HCC)   Rheumatoid  arthritis (HCC)   Atrial flutter with rapid ventricular response ([place])   Acute on chronic renal failure ([place])   Hematuria    Time spent: 25 minutes   [facility] Pager [phone]. If 7PM-7AM, please contact night-coverage at [email or web address], password [facility] 03/18/2015, 4:15 PM  LOS: 4 days

## 2015-03-18 NOTE — Progress Notes (Signed)
Subjective:  Patient denies any chest pain or shortness of breath. Noted to have recurrent episodes of paroxysmal atrial fibrillation with moderate ventricular response and then spontaneously converted to sinus rhythm. Denies any palpitations.  Objective:  Vital Signs in the last 24 hours: Temp:  [98.5 F (36.9 C)-99.2 F (37.3 C)] 99.2 F (37.3 C) (11/24 0535) Pulse Rate:  [91-101] 99 (11/24 0535) Resp:  [15-19] 16 (11/24 0535) BP: (126-162)/(58-96) 145/58 mmHg (11/24 0535) SpO2:  [95 %-100 %] 97 % (11/24 0535) Weight:  [94.167 kg (207 lb 9.6 oz)] 94.167 kg (207 lb 9.6 oz) (11/24 0657)  Intake/Output from previous day: 11/23 0701 - 11/24 0700 In: 113 [P.O.:60; I.V.:3; IV Piggyback:50] Out: 1301 [Urine:1300; Stool:1] Intake/Output from this shift: Total I/O In: -  Out: 200 [Urine:200]  Physical Exam: Neck: no adenopathy, no carotid bruit, no JVD and supple, symmetrical, trachea midline Lungs: clear to auscultation bilaterally Heart: irregularly irregular rhythm, S1, S2 normal and Soft systolic murmur noted Abdomen: soft, non-tender; bowel sounds normal; no masses,  no organomegaly Extremities: extremities normal, atraumatic, no cyanosis or edema  Lab Results:  Recent Labs  03/16/15 0334 03/17/15 0309  WBC 12.9* 13.7*  HGB 11.3* 12.1*  PLT 137* 186    Recent Labs  03/16/15 0334 03/17/15 0309  NA 140 140  K 4.5 4.3  CL 107 108  CO2 25 23  GLUCOSE 97 87  BUN 24* 19  CREATININE 2.11* 1.76*    Recent Labs  03/16/15 0334  TROPONINI 0.25*   Hepatic Function Panel  Recent Labs  03/16/15 0334  PROT 5.3*  ALBUMIN 2.4*  AST 16  ALT 20  ALKPHOS 96  BILITOT 0.6   No results for input(s): CHOL in the last 72 hours. No results for input(s): PROTIME in the last 72 hours.  Imaging: Imaging results have been reviewed and Dg Chest 2 View  03/17/2015  CLINICAL DATA:  Initial encounter for increasing shortness of breath and palpitation. Without overt EXAM:  CHEST  2 VIEW COMPARISON:  03/14/2015. FINDINGS: AP and lateral views of the chest show low volumes with asymmetric elevation of the right hemidiaphragm, unchanged in the interval. No pulmonary edema or focal airspace consolidation. No pleural effusion. Cardiopericardial silhouette is at upper limits of normal for size. Imaged bony structures of the thorax are intact. Telemetry leads overlie the chest. IMPRESSION: Stable exam.  No acute cardiopulmonary findings. Electronically Signed   By: Misty Stanley M.D.   On: 03/17/2015 16:55   Ir Ivc Filter Plmt / S&i /img Guid/mod Sed  03/16/2015  CLINICAL DATA:  81 year old male with a history of bilateral renal obstruction secondary to carcinoma. He has developed right lower extremity DVT with hematuria and is not a candidate for anti coagulation. EXAM: 1. ULTRASOUND GUIDANCE FOR VASCULAR ACCESS OF THE RIGHT INTERNAL JUGULAR VEIN. 2. IVC VENOGRAM. 3. PERCUTANEOUS IVC FILTER PLACEMENT. ANESTHESIA/SEDATION: None CONTRAST:  CO2 contrast FLUOROSCOPY TIME:  One minutes. PROCEDURE: The procedure, risks, benefits, and alternatives were explained to the patient. Specific risks discussed include bleeding, infection, contrast reaction, renal failure, IVC filter fracture, migration, ileo caval thrombus (3% incidence), need for further procedure, need for further surgery, pulmonary embolism, cardiopulmonary collapse, death. Questions regarding the procedure were encouraged and answered. The patient understands and consents to the procedure. Ultrasound survey was performed with images stored and sent to PACs. The neck was prepped with Betadine in a sterile fashion, and a sterile drape was applied covering the operative field. A sterile gown and sterile gloves  were used for the procedure. Local anesthesia was provided with 1% Lidocaine. A micropuncture needle was used access the right internal jugular vein under ultrasound. With excellent venous blood flow returned, and an .018 micro  wire was passed through the needle. Small incision was made with an 11 blade scalpel. The needle was removed, and a micropuncture sheath was placed over the wire. The inner dilator and wire were removed, and an 035 Bentson wire was advanced under fluoroscopy into the IVC. Serial dilation of the soft tissue tract was performed with an 8 [place] dilator and subsequently a 10 [place] dilator. The delivery sheath for a retrievable Bard Denali filter was passed over the Bentson wire into the IVC. The wire was removed and small contrast was used to confirm IVC location. CO2 IVC cavagram performed. Dilator was removed, and the IVC filter was then delivered, positioned below the lowest renal vein. Catheter was removed. Manual pressure was used for hemostasis. Patient tolerated the procedure well and remained hemodynamically stable throughout. No complications were encountered and no significant blood loss was encounter. COMPLICATIONS: None. FINDINGS: CO2 IVC venography demonstrates a normal caliber IVC with no evidence of thrombus. Renal veins are identified bilaterally. The IVC filter was successfully positioned below the level of the renal veins and is appropriately oriented. This IVC filter has both permanent and retrievable indications. IMPRESSION: Status post placement of retrievable IVC filter. This IVC filter is potentially retrievable. The patient can be assessed for filter retrieval by Interventional Radiology in approximately 8-12 weeks if the patient becomes eligible for anti coagulation. Signed, Dulcy Fanny. Earleen Newport, DO [facility] Electronically Signed   By: Corrie Mckusick D.O.   On: 03/16/2015 18:24   Nm Pulmonary Perf And Vent  03/17/2015  CLINICAL DATA:  Shortness of breath. EXAM: NUCLEAR MEDICINE VENTILATION - PERFUSION LUNG SCAN TECHNIQUE: Ventilation images were obtained in multiple projections using inhaled aerosol Tc-49m DTPA. Perfusion images were  obtained in multiple projections after intravenous injection of Tc-56m MAA. RADIOPHARMACEUTICALS:  31.0 Technetium-56m DTPA aerosol inhalation and 4.1 Technetium-66m MAA IV COMPARISON:  Chest x-ray 03/14/2015. FINDINGS: Matching ventilation perfusion defects noted. Indeterminate scan for pulmonary embolus. Elevation right hemidiaphragm is again noted. IMPRESSION: Indeterminate for pulmonary embolus. Electronically Signed   By: Marcello Moores  Register   On: 03/17/2015 16:41    Cardiac Studies:  Assessment/Plan:  Recurrent paroxysmal A. fib flutter with CHADS2 score of 2. Status post small non-Q-wave myocardial infarction due to elevated troponin I probably secondary to demand ischemia. Hypertension. Metastatic bladder cancer. Status post left ureteral obstruction/left nephrostomy tube. Right hydronephrosis Chronic kidney disease. Rheumatoid arthritis. Benign hypertrophy of prostate History of peptic ulcer disease. Bilateral DVT Hematuria Plan Patient not a candidate for chronic anticoagulation and this point but high risk for stroke due to recurrent A. fib and sinus rhythm. Increase amiodarone as per orders.  LOS: 4 days    Charolette Forward 03/18/2015, 9:30 AM

## 2015-03-19 LAB — CBC
HEMATOCRIT: 35.8 % — AB (ref 39.0–52.0)
HEMOGLOBIN: 11.4 g/dL — AB (ref 13.0–17.0)
MCH: 29.5 pg (ref 26.0–34.0)
MCHC: 31.8 g/dL (ref 30.0–36.0)
MCV: 92.5 fL (ref 78.0–100.0)
Platelets: 179 10*3/uL (ref 150–400)
RBC: 3.87 MIL/uL — ABNORMAL LOW (ref 4.22–5.81)
RDW: 14.3 % (ref 11.5–15.5)
WBC: 11 10*3/uL — ABNORMAL HIGH (ref 4.0–10.5)

## 2015-03-19 LAB — BASIC METABOLIC PANEL
Anion gap: 9 (ref 5–15)
BUN: 24 mg/dL — AB (ref 6–20)
CHLORIDE: 105 mmol/L (ref 101–111)
CO2: 26 mmol/L (ref 22–32)
CREATININE: 1.86 mg/dL — AB (ref 0.61–1.24)
Calcium: 8.4 mg/dL — ABNORMAL LOW (ref 8.9–10.3)
GFR calc Af Amer: 37 mL/min — ABNORMAL LOW (ref >60)
GFR calc non Af Amer: 32 mL/min — ABNORMAL LOW (ref >60)
GLUCOSE: 96 mg/dL (ref 65–99)
Potassium: 4.2 mmol/L (ref 3.5–5.1)
SODIUM: 140 mmol/L (ref 135–145)

## 2015-03-19 LAB — GLUCOSE, CAPILLARY
GLUCOSE-CAPILLARY: 141 mg/dL — AB (ref 65–99)
Glucose-Capillary: 122 mg/dL — ABNORMAL HIGH (ref 65–99)
Glucose-Capillary: 155 mg/dL — ABNORMAL HIGH (ref 65–99)
Glucose-Capillary: 131 mg/dL — ABNORMAL HIGH (ref 65–99)

## 2015-03-19 MED ORDER — AMIODARONE HCL 200 MG PO TABS: 200.0000 mg | ORAL_TABLET | Freq: Two times a day (BID) | ORAL | Status: DC

## 2015-03-19 MED ORDER — DILTIAZEM HCL ER COATED BEADS 240 MG PO CP24
240.0000 mg | ORAL_CAPSULE | Freq: Every day | ORAL | Status: DC
  Administered 2015-03-19 – 2015-03-20 (×2): 240 mg via ORAL
  Filled 2015-03-19 (×2): qty 1

## 2015-03-19 MED ORDER — MAGNESIUM HYDROXIDE 400 MG/5ML PO SUSP
30.0000 mL | Freq: Once | ORAL | Status: AC
  Administered 2015-03-19: 30 mL via ORAL
  Filled 2015-03-19: qty 30

## 2015-03-19 MED ORDER — DILTIAZEM HCL ER COATED BEADS 240 MG PO CP24
240.0000 mg | ORAL_CAPSULE | Freq: Every day | ORAL | Status: DC
Start: 2015-03-19 — End: 2015-05-11

## 2015-03-19 MED ORDER — AMIODARONE HCL 400 MG PO TABS: 400.0000 mg | ORAL_TABLET | Freq: Two times a day (BID) | ORAL | Status: DC

## 2015-03-19 MED ORDER — AMIODARONE IV BOLUS ONLY 150 MG/100ML
150.0000 mg | Freq: Once | INTRAVENOUS | Status: AC
  Administered 2015-03-19: 150 mg via INTRAVENOUS
  Filled 2015-03-19: qty 100

## 2015-03-19 NOTE — Progress Notes (Signed)
Spoke with pt concerning home O2 pt would need to pay $306.00 up front related to not having a DX for O2. Pt declined Home O2 related to cost.

## 2015-03-19 NOTE — Progress Notes (Signed)
Physical Therapy Treatment Patient Details Name: Craig Waters. MRN: [id number] DOB: 05/30/1930 Today's Date: 03/19/2015    History of Present Illness Craig Waters. is a 81 y.o. male a PMH of rheumatoid arthritisy, high-grade urothelial cell carcinoma, recently admitted to hospital 10-31: for AKI, due to obstructive uropathy, had nephrostomy tube place. presents to Ed with SOB, aa fib with RVR.    PT Comments    Patient again  C/o feeling weak while walking. Encouraged patient to have family with him  When up walking. Has HHPT on board.   Follow Up Recommendations  Home health PT;Supervision/Assistance - 24 hour     Equipment Recommendations  None recommended by PT    Recommendations for Other Services       Precautions / Restrictions Precautions Precautions: Fall Precaution Comments: Nephrostomy drain on L, monitor VS    Mobility  Bed Mobility Overal bed mobility: Needs Assistance       Supine to sit: Min assist;HOB elevated     General bed mobility comments: assist for trunk  Transfers Overall transfer level: Needs assistance Equipment used: Rolling walker (2 wheeled) Transfers: Sit to/from Stand Sit to Stand: Min assist         General transfer comment: assist to rise and steady  Ambulation/Gait Ambulation/Gait assistance: Min assist;+2 safety/equipment Ambulation Distance (Feet): 75 Feet Assistive device: Rolling walker (2 wheeled) Gait Pattern/deviations: Step-through pattern     General Gait Details: slow, decreased stride, stop for a break standing, c/o feeling weak and requested to turn around.   Stairs            Wheelchair Mobility    Modified Rankin (Stroke Patients Only)       Balance Overall balance assessment: Needs assistance   Sitting balance-Leahy Scale: Fair     Standing balance support: During functional activity;Bilateral upper extremity supported Standing balance-Leahy Scale: Fair                       Cognition Arousal/Alertness: Awake/alert Behavior During Therapy: WFL for tasks assessed/performed Overall Cognitive Status: Within Functional Limits for tasks assessed                      Exercises      General Comments        Pertinent Vitals/Pain Faces Pain Scale: Hurts even more Pain Location: back Pain Descriptors / Indicators: Aching;Discomfort Pain Intervention(s): Limited activity within patient's tolerance;Monitored during session;Repositioned    Home Living                      Prior Function            PT Goals (current goals can now be found in the care plan section) Progress towards PT goals: Progressing toward goals    Frequency  Min 3X/week    PT Plan Current plan remains appropriate    Co-evaluation             End of Session Equipment Utilized During Treatment: Gait belt Activity Tolerance: Patient limited by fatigue Patient left: in chair;with call bell/phone within reach;with chair alarm set     Time: [id number] PT Time Calculation (min) (ACUTE ONLY): 10 min  Charges:  $Gait Training: 8-22 mins                    G Codes:      Marcelino Freestone PT [id number]  03/19/2015, 1:49 PM

## 2015-03-19 NOTE — Progress Notes (Signed)
Subjective:  Denies any chest pain or shortness of breath. Remains now in sinus rhythm.  Objective:  Vital Signs in the last 24 hours: Temp:  [98.1 F (36.7 C)-98.3 F (36.8 C)] 98.1 F (36.7 C) (11/25 0550) Pulse Rate:  [78-89] 78 (11/25 0550) Resp:  [16-18] 18 (11/25 0550) BP: (130-138)/(64-73) 136/69 mmHg (11/25 0550) SpO2:  [94 %-97 %] 96 % (11/25 0550) Weight:  [93.35 kg (205 lb 12.8 oz)] 93.35 kg (205 lb 12.8 oz) (11/25 0550)  Intake/Output from previous day: 11/24 0701 - 11/25 0700 In: 410 [P.O.:360; IV Piggyback:50] Out: 500 [Urine:500] Intake/Output from this shift:    Physical Exam: Neck: no adenopathy, no carotid bruit, no JVD and supple, symmetrical, trachea midline Lungs: clear to auscultation bilaterally Heart: regular rate and rhythm, S1, S2 normal and Soft systolic murmur noted Abdomen: soft, non-tender; bowel sounds normal; no masses,  no organomegaly Extremities: extremities normal, atraumatic, no cyanosis or edema Left nephrostomy tube noted Lab Results:  Recent Labs  03/17/15 0309 03/19/15 0430  WBC 13.7* 11.0*  HGB 12.1* 11.4*  PLT 186 179    Recent Labs  03/17/15 0309 03/19/15 0430  NA 140 140  K 4.3 4.2  CL 108 105  CO2 23 26  GLUCOSE 87 96  BUN 19 24*  CREATININE 1.76* 1.86*   No results for input(s): TROPONINI in the last 72 hours.  Invalid input(s): CK, MB Hepatic Function Panel No results for input(s): PROT, ALBUMIN, AST, ALT, ALKPHOS, BILITOT, BILIDIR, IBILI in the last 72 hours. No results for input(s): CHOL in the last 72 hours. No results for input(s): PROTIME in the last 72 hours.  Imaging: Imaging results have been reviewed and Dg Chest 2 View  03/17/2015  CLINICAL DATA:  Initial encounter for increasing shortness of breath and palpitation. Without overt EXAM: CHEST  2 VIEW COMPARISON:  03/14/2015. FINDINGS: AP and lateral views of the chest show low volumes with asymmetric elevation of the right hemidiaphragm, unchanged  in the interval. No pulmonary edema or focal airspace consolidation. No pleural effusion. Cardiopericardial silhouette is at upper limits of normal for size. Imaged bony structures of the thorax are intact. Telemetry leads overlie the chest. IMPRESSION: Stable exam.  No acute cardiopulmonary findings. Electronically Signed   By: Misty Stanley M.D.   On: 03/17/2015 16:55   Nm Pulmonary Perf And Vent  03/17/2015  CLINICAL DATA:  Shortness of breath. EXAM: NUCLEAR MEDICINE VENTILATION - PERFUSION LUNG SCAN TECHNIQUE: Ventilation images were obtained in multiple projections using inhaled aerosol Tc-34m DTPA. Perfusion images were obtained in multiple projections after intravenous injection of Tc-28m MAA. RADIOPHARMACEUTICALS:  31.0 Technetium-41m DTPA aerosol inhalation and 4.1 Technetium-31m MAA IV COMPARISON:  Chest x-ray 03/14/2015. FINDINGS: Matching ventilation perfusion defects noted. Indeterminate scan for pulmonary embolus. Elevation right hemidiaphragm is again noted. IMPRESSION: Indeterminate for pulmonary embolus. Electronically Signed   By: Marcello Moores  Register   On: 03/17/2015 16:41    Cardiac Studies:  Assessment/Plan:  Status post Recurrent paroxysmal A. fib flutter with CHADS2 score of 2. Status post small non-Q-wave myocardial infarction due to elevated troponin I probably secondary to demand ischemia. Hypertension. Metastatic bladder cancer. Status post left ureteral obstruction/left nephrostomy tube. Right hydronephrosis Chronic kidney disease. Rheumatoid arthritis. Benign hypertrophy of prostate History of peptic ulcer disease. Bilateral DVT Hematuria Plan Continue amiodarone 400 mg by mouth twice daily for one week and then 200 mg by mouth twice daily Switch Cardizem to diltiazem CD 240 mg daily Start anticoagulation once cleared by urology. Okay  to discharge from cardiac point of view Follow-up with me in 2 weeks Will Sign off please call if needed  LOS: 5 days     Charolette Forward 03/19/2015, 10:48 AM

## 2015-03-19 NOTE — Evaluation (Signed)
Occupational Therapy Evaluation Patient Details Name: Craig Waters. MRN: [id number] DOB: [birth year] Today's Date: 03/19/2015    History of Present Illness Craig Waters. is a 81 y.o. male a PMH of rheumatoid arthritisy, high-grade urothelial cell carcinoma, recently admitted to hospital 10-31: for AKI, due to obstructive uropathy, had nephrostomy tube place. presents to Ed with SOB, aa fib with RVR.   Clinical Impression   Pt was admitted for the above and will benefit from skilled OT.  He was recently hospitalized prior to this admission and did not return to independent status.  He currently needs min A for toilet transfer and up to total A for LB dressing due to fatique/cardiopulmonary status.  Goals in acute are for min guard to mod A.  Of note, during evaluation, pt became dizzy walking back to bed BP 101/58 supine.  HR 114 during adls and dropped to 83 when supine. On RA, 94-99%.    Follow Up Recommendations  Home health OT;Supervision/Assistance - 24 hour    Equipment Recommendations  None recommended by OT    Recommendations for Other Services       Precautions / Restrictions Precautions Precautions: Fall Precaution Comments: Nephrostomy drain on L, monitor VS Restrictions Weight Bearing Restrictions: No      Mobility Bed Mobility         Supine to sit: Min assist;HOB elevated Sit to supine: Supervision   General bed mobility comments: assist for trunk  Transfers   Equipment used: Rolling walker (2 wheeled)   Sit to Stand: Min assist         General transfer comment: assist to rise and steady    Balance                                            ADL Overall ADL's : Needs assistance/impaired     Grooming: Wash/dry hands;Set up;Sitting   Upper Body Bathing: Set up;Sitting   Lower Body Bathing: Moderate assistance;Sit to/from stand   Upper Body Dressing : Minimal assistance;Sitting (lines)   Lower Body Dressing: Total  assistance;Sit to/from stand   Toilet Transfer: Minimal assistance;Ambulation;BSC;RW   Toileting- Water quality scientist and Hygiene: Min guard;Sit to/from stand         General ADL Comments: ambulated to bathroom and completed bathing.  Pt became very fatiqued.  when returned to supine rechecked vitals BP 101/58 supine and HR down to 83.  Sats 94-99% entire session on RA     Vision     Perception     Praxis      Pertinent Vitals/Pain Pain Assessment: No/denies pain     Hand Dominance     Extremity/Trunk Assessment Upper Extremity Assessment Upper Extremity Assessment: Overall WFL for tasks assessed           Communication Communication Communication: No difficulties   Cognition Arousal/Alertness: Awake/alert Behavior During Therapy: WFL for tasks assessed/performed Overall Cognitive Status: Within Functional Limits for tasks assessed                     General Comments       Exercises       Shoulder Instructions      Home Living Family/patient expects to be discharged to:: Private residence Living Arrangements: Children Available Help at Discharge: Family;Available 24 hours/day  Bathroom Shower/Tub: Teacher, early years/pre: Standard     Home Equipment: Environmental consultant - 2 wheels;Cane - single point;Bedside commode;Shower seat;Hospital bed          Prior Functioning/Environment Level of Independence: Independent        Comments: has been having  HHPT    OT Diagnosis: Generalized weakness   OT Problem List: Decreased strength;Decreased activity tolerance;Impaired balance (sitting and/or standing);Decreased knowledge of use of DME or AE;Cardiopulmonary status limiting activity   OT Treatment/Interventions: Self-care/ADL training;Patient/family education;Therapeutic activities;DME and/or AE instruction;Balance training;Energy conservation    OT Goals(Current goals can be found in the care plan section) Acute Rehab  OT Goals Patient Stated Goal: home and get stronger OT Goal Formulation: With patient Time For Goal Achievement: 04/02/15 Potential to Achieve Goals: Good ADL Goals Pt Will Perform Lower Body Bathing: with min assist;with adaptive equipment;sit to/from stand Pt Will Perform Lower Body Dressing: with mod assist;with adaptive equipment;sit to/from stand Pt Will Transfer to Toilet: with min guard assist;ambulating;bedside commode Pt Will Perform Toileting - Clothing Manipulation and hygiene: with supervision;sit to/from stand Additional ADL Goal #1: pt will initiate at least one rest break for energy conservation  OT Frequency: Min 2X/week   Barriers to D/C:            Co-evaluation              End of Session    Activity Tolerance: Patient limited by fatigue Patient left: in bed;with call bell/phone within reach;with bed alarm set   Time: [id number] OT Time Calculation (min): 28 min Charges:  OT General Charges $OT Visit: 1 Procedure OT Evaluation $Initial OT Evaluation Tier I: 1 Procedure OT Treatments $Self Care/Home Management : 8-22 mins G-Codes:    Craig Waters Mar 21, 2015, 9:07 AM

## 2015-03-19 NOTE — Discharge Summary (Signed)
Physician Discharge Summary  [name] [id number] DOB: [birth year] DOA: 03/14/2015  PCP: [name], MD  Admit date: 03/14/2015 Discharge date: 03/19/2015  Time spent: 30 minutes  Recommendations for Outpatient Follow-up:  1. Follow up with PCP in one week.  2. Please follow up with cardiology as recommended.  3. Please follow up with urology  At [facility] as soon as possible.    Discharge Diagnoses:  Principal Problem:   Atrial fibrillation with RVR ([place]) Active Problems:   HTN (hypertension)   Urothelial carcinoma (HCC)   Rheumatoid arthritis (HCC)   Atrial flutter with rapid ventricular response ([place])   Acute on chronic renal failure ([place])   Hematuria   Discharge Condition: improved  Diet recommendation: low sodium diet/ carb modified diet  Filed Weights   03/17/15 0548 03/18/15 0657 03/19/15 0550  Weight: 92.7 kg (204 lb 5.9 oz) 94.167 kg (207 lb 9.6 oz) 93.35 kg (205 lb 12.8 oz)    History of present illness:  Vassilios Jerabek. is a 81 y.o. male a PMH of rheumatoid arthritis on chronic prednisone and other immunosuppressive therapy, high-grade urothelial cell carcinoma, under the care of Dr. [name] at [facility], recently admitted to hospital 10-31: for AKI, due to obstructive uropathy, had nephrostomy tube place   Patient presents with increase SOB, palpitation. He relates dyspnea for months but worse for last 2 days. He denies chest pain. He has not had surgery for urothelial cancer done. His urine continue to be bloody. He has left side nephrostomy tube in place. He was started on bactrim on friday for possible UTI, by his PCP.   Evaluation in the ED; He was found to be in A fib RVR. Chest x ray: stable cardiomegaly, mild elevated troponin at 0.50. Cardiology consulted and medications optimized and his rate well controlled on discharge.  Urology consulted and recommended outpatient follow up with urology at [facility] Course:  1-A  fib RVR; initially started on cardizem gtt , later on changed to po cardizem, and resume the amiodarone. Rate better controlled, increased the dose of cardizem and amiodarone. Patient continues to be normal sinus rhythm. D-dimer elevated, and he was found to have DVT, IVC filter placed ON 11/22.  Patient may also have pulmonary embolism but Not a candidate for anticoagulation/antiplatelet therapy because of hematuria.  He underwent V/Q SCAN which was intermediate probability for PE.  His rate better controlled  On discharge   2-Mild elevated troponin:  In setting of A fib RVR. Cardiology following.  Denies active chest pain.  Recent 2-D echo on 02/17/15 with an EF of 65-70%, no regional wall motion abnormalities were identified.  No further interventions.   3-Acute on chronic renal failure: Stage III, baseline 1.5, H/O Urothelia cancer, bilateral hydronephrosis, left side nephrostomy in place.  Renal function improving, multifactorial, possibly from dehydration and UTI. Left nephrostomy catheter seems to be draining, no hydronephrosis on the left. Right-sided hydro-ureter nephrosis, urology following. According to Urology no indication for acute intervention during this hospitalization. Patient to follow-up with his urologist at [facility]. His most of the urine output is from the left nephrostomy tube and minimal output from the foley catheter and is bloody, which the patient says is normal.     4-Hyperglycemia; SSI. Hemoglobin A1c 7.1 CBG (last 3)   Recent Labs (last 2 labs)      Recent Labs  03/19/15 0726 03/19/15 1141 03/19/15 1644  GLUCAP 122* 155* 141*     Resume home  meds on discharge.     5-Recent UTI; continue IV ceftriaxone while hospitalized, completed 5 days of IV antibiotics. Urine culture shows multiple morphologies.   6. Metastatic bladder cancer-Known high grade bladder cancer s/p palliative resections at [facility] by Dr. [name].Last seen  there 11/3 with planned palliative resection next month. CT 01/2015 with liver lesion and Lt pelvic adenopathy concerning for likely metastatic disease.    7. Constipation- resolved. Had BM .   8. Hypoxia: Weaned him of oxygen. No oxygen requirement on discharge.   9. Dizziness on walking: Resolved.        Procedures:  none  Consultations:  cardiology  urology  Discharge Exam: Filed Vitals:   03/19/15 0039 03/19/15 0550  BP: 130/64 136/69  Pulse: 89 78  Temp:  98.1 F (36.7 C)  Resp:  18    General: alert afebrile comfortable Cardiovascular: s1s2 Respiratory: ctab  Discharge Instructions    Current Discharge Medication List    START taking these medications   Details  !! amiodarone (PACERONE) 200 MG tablet Take 1 tablet (200 mg total) by mouth 2 (two) times daily. Qty: 60 tablet, Refills: 0    !! amiodarone (PACERONE) 400 MG tablet Take 1 tablet (400 mg total) by mouth 2 (two) times daily. Qty: 14 tablet, Refills: 0    diltiazem (CARDIZEM CD) 240 MG 24 hr capsule Take 1 capsule (240 mg total) by mouth daily. Qty: 30 capsule, Refills: 1     !! - Potential duplicate medications found. Please discuss with provider.    CONTINUE these medications which have NOT CHANGED   Details  acetaminophen (TYLENOL) 650 MG CR tablet Take 1,300 mg by mouth every 6 (six) hours as needed for pain.    acetaminophen-codeine (TYLENOL #3) 300-30 MG tablet Take 1 tablet by mouth every 4 (four) hours as needed for moderate pain.    b complex vitamins tablet Take 1 tablet by mouth daily.    Calcium Citrate-Vitamin D (CALCIUM + D PO) Take 1 tablet by mouth daily.    hyoscyamine (LEVSIN SL) 0.125 MG SL tablet Take 0.125 mg by mouth every 4 (four) hours as needed (bladder spasms).     influenza vac recombinant HA trivalent (FLUBLOK) injection Inject 0.5 mLs into the muscle once.    Multiple Vitamin (MULTIVITAMIN WITH MINERALS) TABS tablet Take 1 tablet by mouth daily.     ondansetron (ZOFRAN) 4 MG tablet Take 1 tablet (4 mg total) by mouth every 6 (six) hours as needed for nausea. Qty: 20 tablet, Refills: 0    oxyCODONE-acetaminophen (PERCOCET/ROXICET) 5-325 MG tablet Take 1 tablet by mouth every 4 (four) hours as needed for moderate pain. Qty: 20 tablet, Refills: 0    polyethylene glycol powder (GLYCOLAX/MIRALAX) powder Take 255 g by mouth daily. Qty: 500 g, Refills: 3    predniSONE (DELTASONE) 5 MG tablet Take 7.5 mg by mouth daily with breakfast. .    senna (SENOKOT) 8.6 MG tablet Take 1 tablet by mouth daily.    traMADol (ULTRAM) 50 MG tablet Take 50 mg by mouth every 6 (six) hours as needed for moderate pain.      STOP taking these medications     diltiazem (TIAZAC) 180 MG 24 hr capsule      sulfamethoxazole-trimethoprim (BACTRIM DS,SEPTRA DS) 800-160 MG tablet        No Known Allergies Follow-up Information    Follow up with [name], MD. Schedule an appointment as soon as possible for a visit in 1 week.  Specialty:  Internal Medicine   Contact information:   [address] [place] [address] [place] [address] [phone]       Follow up with urology at [facility] . Schedule an appointment as soon as possible for a visit in 1 week.       The results of significant diagnostics from this hospitalization (including imaging, microbiology, ancillary and laboratory) are listed below for reference.    Significant Diagnostic Studies: Ct Abdomen Pelvis Wo Contrast  03/14/2015  CLINICAL DATA:  Acute on chronic renal failure. Prostatectomy. Hypertension. Uroepithelial cancer diagnosed 2/16. Left nephrostomy tube. Possible urinary tract infection. EXAM: CT ABDOMEN AND PELVIS WITHOUT CONTRAST TECHNIQUE: Multidetector CT imaging of the abdomen and pelvis was performed following the standard protocol without IV contrast. COMPARISON:  Plain films 02/20/2015.  Most recent CT 02/15/2015. FINDINGS: Lower chest: Volume loss with probable  scarring at the anterior left lung base. Mild cardiomegaly with LAD coronary artery atherosclerosis. Right pulmonary artery enlargement at 3.5 cm (image 1, series 2). Right hemidiaphragm elevation. Hepatobiliary: Suboptimal evaluation of the hypo attenuating posterior right hepatic lobe lesion secondary lack of IV contrast. Measures on the order of 4.2 cm in anterior posterior dimension today versus similar (when remeasured). No definite new liver lesions identified. Normal gallbladder, without biliary ductal dilatation. Pancreas: Mild pancreatic atrophy, within normal variation for age. No duct dilatation. Spleen: Normal in size, without focal abnormality. Adrenals/Urinary Tract: Normal adrenal glands. Low-density bilateral renal lesions are likely cysts. Bilateral renal cortical thinning. Placement of a left-sided nephrostomy catheter since the prior CT, with resolution of hydronephrosis. Mild right-sided hydroureteronephrosis is similar. Ureteric prominence followed to the level of the urinary bladder. Distal left ureteric stone is approximately 2 cm proximal to the bladder and measures 5 mm (image 86, series 2). Similar in position to on the prior. Bladder decompressed around a Foley catheter. Soft tissue fullness throughout is consistent with the clinical history of bladder carcinoma. There is irregularity in the pericystic fat, suggesting direct tumor extension. Soft tissue density also extends anteriorly toward the median umbilical ligament (image 93, series 2). This is grossly similar and suspicious for tumor involvement. The fat plane between the seminal vesicles and posterior wall bladder is ill-defined on image 92. Soft tissue fullness in the region of the prostatectomy bed on image 97 may represent direct tumor extension. No pelvic sidewall extension. Stomach/Bowel: Normal stomach, without wall thickening. Normal colon and terminal ileum. Normal small bowel. Vascular/Lymphatic: Aortic and branch vessel  atherosclerosis. Non aneurysmal dilatation of the infrarenal aorta 2.5 cm. Similar small retroperitoneal nodes, without abdominal adenopathy. Left external iliac index node measures 10 mm on image 82 and is unchanged. Reproductive: prostatectomy. Soft tissue fullness within the prostatectomy bed is likely due to direct bladder extension. Other: No significant free fluid. Fat containing inguinal hernias are greater on the right. No evidence of omental or peritoneal disease. Musculoskeletal: Trace L3-4 anterolisthesis. Degenerative disc disease at this level. IMPRESSION: 1. Placement of a left nephrostomy catheter with resolution of left-sided hydronephrosis. Persistent right-sided hydroureteronephrosis, presumably secondary to bladder process. 2. Left distal ureteric stone, as before. 3. Bladder decompressed around a Foley catheter. Likely locally advanced bladder carcinoma with soft tissue fullness extending about the bladder. Similar mild left pelvic adenopathy, suspicious for nodal metastasis. 4. Grossly similar right hepatic lobe low-density lesion, suboptimally evaluated secondary to lack of contrast. 5. Pulmonary artery enlargement suggests pulmonary arterial hypertension. 6.  Atherosclerosis, including within the coronary arteries. Electronically Signed   By: Abigail Miyamoto  M.D.   On: 03/14/2015 18:04   Dg Chest 2 View  03/17/2015  CLINICAL DATA:  Initial encounter for increasing shortness of breath and palpitation. Without overt EXAM: CHEST  2 VIEW COMPARISON:  03/14/2015. FINDINGS: AP and lateral views of the chest show low volumes with asymmetric elevation of the right hemidiaphragm, unchanged in the interval. No pulmonary edema or focal airspace consolidation. No pleural effusion. Cardiopericardial silhouette is at upper limits of normal for size. Imaged bony structures of the thorax are intact. Telemetry leads overlie the chest. IMPRESSION: Stable exam.  No acute cardiopulmonary findings. Electronically  Signed   By: Misty Stanley M.D.   On: 03/17/2015 16:55   Ir Ivc Filter Plmt / S&i /img Guid/mod Sed  03/16/2015  CLINICAL DATA:  81 year old male with a history of bilateral renal obstruction secondary to carcinoma. He has developed right lower extremity DVT with hematuria and is not a candidate for anti coagulation. EXAM: 1. ULTRASOUND GUIDANCE FOR VASCULAR ACCESS OF THE RIGHT INTERNAL JUGULAR VEIN. 2. IVC VENOGRAM. 3. PERCUTANEOUS IVC FILTER PLACEMENT. ANESTHESIA/SEDATION: None CONTRAST:  CO2 contrast FLUOROSCOPY TIME:  One minutes. PROCEDURE: The procedure, risks, benefits, and alternatives were explained to the patient. Specific risks discussed include bleeding, infection, contrast reaction, renal failure, IVC filter fracture, migration, ileo caval thrombus (3% incidence), need for further procedure, need for further surgery, pulmonary embolism, cardiopulmonary collapse, death. Questions regarding the procedure were encouraged and answered. The patient understands and consents to the procedure. Ultrasound survey was performed with images stored and sent to PACs. The neck was prepped with Betadine in a sterile fashion, and a sterile drape was applied covering the operative field. A sterile gown and sterile gloves were used for the procedure. Local anesthesia was provided with 1% Lidocaine. A micropuncture needle was used access the right internal jugular vein under ultrasound. With excellent venous blood flow returned, and an .018 micro wire was passed through the needle. Small incision was made with an 11 blade scalpel. The needle was removed, and a micropuncture sheath was placed over the wire. The inner dilator and wire were removed, and an 035 [place] wire was advanced under fluoroscopy into the IVC. Serial dilation of the soft tissue tract was performed with an 8 [place] dilator and subsequently a 10 [place] dilator. The delivery sheath for a retrievable Bard Denali filter was passed over the Bentson wire  into the IVC. The wire was removed and small contrast was used to confirm IVC location. CO2 IVC cavagram performed. Dilator was removed, and the IVC filter was then delivered, positioned below the lowest renal vein. Catheter was removed. Manual pressure was used for hemostasis. Patient tolerated the procedure well and remained hemodynamically stable throughout. No complications were encountered and no significant blood loss was encounter. COMPLICATIONS: None. FINDINGS: CO2 IVC venography demonstrates a normal caliber IVC with no evidence of thrombus. Renal veins are identified bilaterally. The IVC filter was successfully positioned below the level of the renal veins and is appropriately oriented. This IVC filter has both permanent and retrievable indications. IMPRESSION: Status post placement of retrievable IVC filter. This IVC filter is potentially retrievable. The patient can be assessed for filter retrieval by Interventional Radiology in approximately 8-12 weeks if the patient becomes eligible for anti coagulation. Signed, Dulcy Fanny. Earleen Newport, DO [facility] Electronically Signed   By: Corrie Mckusick D.O.   On: 03/16/2015 18:24   Nm Pulmonary Perf And Vent  03/17/2015  CLINICAL DATA:  Shortness of breath.  EXAM: NUCLEAR MEDICINE VENTILATION - PERFUSION LUNG SCAN TECHNIQUE: Ventilation images were obtained in multiple projections using inhaled aerosol Tc-67m DTPA. Perfusion images were obtained in multiple projections after intravenous injection of Tc-35m MAA. RADIOPHARMACEUTICALS:  31.0 Technetium-41m DTPA aerosol inhalation and 4.1 Technetium-34m MAA IV COMPARISON:  Chest x-ray 03/14/2015. FINDINGS: Matching ventilation perfusion defects noted. Indeterminate scan for pulmonary embolus. Elevation right hemidiaphragm is again noted. IMPRESSION: Indeterminate for pulmonary embolus. Electronically Signed   By: Marcello Moores  Register   On: 03/17/2015 16:41   Dg  Chest Port 1 View  03/14/2015  CLINICAL DATA:  Shortness of breath EXAM: PORTABLE CHEST 1 VIEW COMPARISON:  02/15/2015 chest radiograph. FINDINGS: Stable cardiomediastinal silhouette with mild cardiomegaly. No pneumothorax. Possible stable trace right pleural effusion. No appreciable left pleural effusion. No overt pulmonary edema. Stable elevation of the right hemidiaphragm. Stable right basilar curvilinear opacities, favor atelectasis. No new focal lung opacity. IMPRESSION: 1. Stable mild cardiomegaly without overt pulmonary edema. 2. Stable elevation of the right hemidiaphragm with likely stable trace right pleural effusion. 3. Stable curvilinear right basal lung opacities, favor atelectasis. No new focal lung opacity. Electronically Signed   By: Ilona Sorrel M.D.   On: 03/14/2015 13:18   Dg Abd 2 Views  02/20/2015  CLINICAL DATA:  Abdominal pain, follow-up ileus, status post nephrectomy EXAM: ABDOMEN - 2 VIEW COMPARISON:  02/19/2015 FINDINGS: Mild gaseous distention of small and large bowel, compatible with history of postoperative adynamic ileus. No evidence of bowel obstruction. Left percutaneous nephrostomy. Degenerative changes the lumbar spine. IMPRESSION: No evidence of bowel obstruction. Mild gaseous distention of small and large bowel, compatible with history of postoperative adynamic ileus. Electronically Signed   By: Julian Hy M.D.   On: 02/20/2015 12:28   Dg Abd Portable 1v  02/19/2015  CLINICAL DATA:  Abdominal pain, nausea, constipation EXAM: PORTABLE ABDOMEN - 1 VIEW COMPARISON:  None. FINDINGS: Left nephrostomy catheter is noted. Gaseous distended small bowel loops on mid abdomen highly suspicious for significant ileus or partial small bowel obstruction. Stool and gas noted in distal colon. IMPRESSION: Gaseous distended small bowel loops mid abdomen highly suspicious for significant ileus or bowel obstruction. Electronically Signed   By: Lahoma Crocker M.D.   On: 02/19/2015 14:10     Microbiology: Recent Results (from the past 240 hour(s))  Urine culture     Status: None   Collection Time: 03/15/15  5:33 AM  Result Value Ref Range Status   Specimen Description URINE, CATHETERIZED  Final   Special Requests NONE  Final   Culture   Final    MULTIPLE SPECIES PRESENT, SUGGEST RECOLLECTION Performed at [facility]    Report Status 03/16/2015 FINAL  Final     Labs: Basic Metabolic Panel:  Recent Labs Lab 03/14/15 1220 03/15/15 0334 03/16/15 0334 03/17/15 0309 03/19/15 0430  NA 136 138 140 140 140  K 4.1 4.5 4.5 4.3 4.2  CL 102 106 107 108 105  CO2 22 23 25 23 26   GLUCOSE 203* 107* 97 87 96  BUN 23* 19 24* 19 24*  CREATININE 1.94* 2.03* 2.11* 1.76* 1.86*  CALCIUM 8.2* 8.1* 8.0* 8.2* 8.4*  MG  --  1.9  --   --   --    Liver Function Tests:  Recent Labs Lab 03/16/15 0334  AST 16  ALT 20  ALKPHOS 96  BILITOT 0.6  PROT 5.3*  ALBUMIN 2.4*   No results for input(s): LIPASE, AMYLASE in the last 168 hours. No results for input(s):  AMMONIA in the last 168 hours. CBC:  Recent Labs Lab 03/14/15 1220 03/15/15 0334 03/16/15 0334 03/17/15 0309 03/19/15 0430  WBC 13.6* 15.1* 12.9* 13.7* 11.0*  NEUTROABS 11.3*  --   --   --   --   HGB 13.5 12.3* 11.3* 12.1* 11.4*  HCT 41.9 38.2* 35.6* 37.6* 35.8*  MCV 92.3 92.7 93.4 93.5 92.5  PLT 125* 150 137* 186 179   Cardiac Enzymes:  Recent Labs Lab 03/14/15 1558 03/14/15 2140 03/15/15 0334 03/16/15 0334  TROPONINI 1.05* 0.72* 0.53* 0.25*   BNP: BNP (last 3 results)  Recent Labs  02/15/15 1206 03/14/15 1220  BNP 522.9* 137.0*    ProBNP (last 3 results) No results for input(s): PROBNP in the last 8760 hours.  CBG:  Recent Labs Lab 03/18/15 0739 03/18/15 1213 03/18/15 1708 03/18/15 2138 03/19/15 0726  GLUCAP 116* 162* 117* 149* 122*       Signed:  Darshay Deupree  [facility] 03/19/2015, 11:43 AM

## 2015-03-19 NOTE — Progress Notes (Signed)
[facility] PROGRESS NOTE  Craig Waters. [id number] DOB: 1930/06/12 DOA: 03/14/2015 PCP: [name], MD    Assessment/Plan: Principal Problem:   Atrial fibrillation with RVR ([place]) Active Problems:   HTN (hypertension)   Urothelial carcinoma (HCC)   Rheumatoid arthritis (HCC)   Atrial flutter with rapid ventricular response (HCC)   Acute on chronic renal failure (HCC)   Hematuria    Brief summary Craig Waters. is a 81 y.o. male a PMH of rheumatoid arthritis on chronic prednisone and other immunosuppressive therapy, high-grade urothelial cell carcinoma, under the care of Dr. [name] at [facility], recently admitted to hospital 10-31: for AKI, due to obstructive uropathy, had nephrostomy tube place   Patient presents with increase SOB, palpitation. He relates dyspnea for months but worse for last 2 days. He denies chest pain. He has not had surgery for urothelial cancer done. His urine continue to be bloody. He has left side nephrostomy tube in place. He was started on bactrim on friday for possible UTI, by his PCP.   Evaluation in the ED; He was found to be in A fib RVR. Chest x ray: stable cardiomegaly, mild elevated troponin at 0.50  Assessment and plan 1-A fib RVR; initially started on cardizem gtt , later on changed to po cardizem, and resume the amiodarone. Rate better controlled, increased the dose of cardizem and amiodarone. Appreciate cardiology recommendations Patient continues to be normal sinus rhythm. D-dimer elevated, and he was found to have DVT,  IVC filter placed ON 11/22.  Patient may also have pulmonary embolism but Not a  candidate for anticoagulation/antiplatelet therapy because of hematuria.  He underwent V/Q SCAN which was intermediate probability for PE.  Today his rate is slightly elevated and he was given a dose of IV amiodarone.    2-Mild elevated troponin:  In setting of A fib RVR. Cardiology following.  Denies active  chest pain.  Recent 2-D echo on 02/17/15 with an EF of 65-70%, no regional wall motion abnormalities were identified.  No further interventions.   3-Acute on chronic renal failure: Stage III, baseline 1.5, H/O Urothelia cancer, bilateral hydronephrosis, left side nephrostomy in place.  Renal function improving, multifactorial, possibly from dehydration and UTI. Left nephrostomy catheter seems to be draining, no hydronephrosis on the left. Right-sided hydro-ureter nephrosis, urology following. According to Urology no indication for acute intervention during this hospitalization. Patient to follow-up with his urologist at [facility]. His most of the urine output is from the left nephrostomy tube and minimal output from the foley catheter and is bloody, which the patient says is normal.     4-Hyperglycemia; SSI. Hemoglobin A1c 7.1 CBG (last 3)   Recent Labs  03/19/15 0726 03/19/15 1141 03/19/15 1644  GLUCAP 122* 155* 141*   His Cbg'S are much better.     5-Recent UTI; continue IV ceftriaxone while hospitalized. . Urine culture shows multiple morphologies.  6. Metastatic bladder cancer-Known high grade bladder cancer s/p palliative resections at [facility] by Dr. [name].Last seen there 11/3 with planned palliative resection next month. CT 01/2015 with liver lesion and Lt pelvic adenopathy concerning for likely metastatic disease.    7. Constipation- resolved. Had BM .   8. Hypoxia: Weaned him of oxygen.   9. Dizziness on walking: Orthostatics positive.    DVT prophylaxsis SCDs  Code Status:      Code Status Orders        Start     Ordered   03/14/15  1626  Full code   Continuous     03/14/15 1626     Family Communication: family updated about patient's clinical progress Disposition Plan:  Anticipate discharge when hematuria improves     Consultants:  Neurology  Cardiology    Procedures:  None  Antibiotics: Anti-infectives    Start     Dose/Rate  Route Frequency Ordered Stop   03/14/15 1545  cefTRIAXone (ROCEPHIN) 1 g in dextrose 5 % 50 mL IVPB     1 g 100 mL/hr over 30 Minutes Intravenous Every 24 hours 03/14/15 1538           HPI/Subjective: Doesn't want to go home feeling dizzy.    Objective: Filed Vitals:   03/19/15 0039 03/19/15 0550 03/19/15 1338 03/19/15 1540  BP: 130/64 136/69 115/73   Pulse: 89 78 98 97  Temp:  98.1 F (36.7 C) 98.8 F (37.1 C)   TempSrc:  Oral Oral   Resp:  18 18   Height:      Weight:  93.35 kg (205 lb 12.8 oz)    SpO2: 94% 96% 97%     Intake/Output Summary (Last 24 hours) at 03/19/15 1833 Last data filed at 03/19/15 1821  Gross per 24 hour  Intake    890 ml  Output    850 ml  Net     40 ml    Exam:  General: No acute respiratory distress Lungs: Clear to auscultation bilaterally without wheezes or crackles Cardiovascular: Regular rate and rhythm without murmur gallop or rub normal S1 and S2 Abdomen: Nontender, nondistended, soft, bowel sounds positive, no rebound, no ascites, no appreciable mass Extremities: No significant cyanosis, clubbing, or edema bilateral lower extremities     Data Review   Micro Results Recent Results (from the past 240 hour(s))  Urine culture     Status: None   Collection Time: 03/15/15  5:33 AM  Result Value Ref Range Status   Specimen Description URINE, CATHETERIZED  Final   Special Requests NONE  Final   Culture   Final    MULTIPLE SPECIES PRESENT, SUGGEST RECOLLECTION Performed at [facility]    Report Status 03/16/2015 FINAL  Final    Radiology Reports Ct Abdomen Pelvis Wo Contrast  03/14/2015  CLINICAL DATA:  Acute on chronic renal failure. Prostatectomy. Hypertension. Uroepithelial cancer diagnosed 2/16. Left nephrostomy tube. Possible urinary tract infection. EXAM: CT ABDOMEN AND PELVIS WITHOUT CONTRAST TECHNIQUE: Multidetector CT imaging of the abdomen and pelvis was performed following the standard protocol without IV  contrast. COMPARISON:  Plain films 02/20/2015.  Most recent CT 02/15/2015. FINDINGS: Lower chest: Volume loss with probable scarring at the anterior left lung base. Mild cardiomegaly with LAD coronary artery atherosclerosis. Right pulmonary artery enlargement at 3.5 cm (image 1, series 2). Right hemidiaphragm elevation. Hepatobiliary: Suboptimal evaluation of the hypo attenuating posterior right hepatic lobe lesion secondary lack of IV contrast. Measures on the order of 4.2 cm in anterior posterior dimension today versus similar (when remeasured). No definite new liver lesions identified. Normal gallbladder, without biliary ductal dilatation. Pancreas: Mild pancreatic atrophy, within normal variation for age. No duct dilatation. Spleen: Normal in size, without focal abnormality. Adrenals/Urinary Tract: Normal adrenal glands. Low-density bilateral renal lesions are likely cysts. Bilateral renal cortical thinning. Placement of a left-sided nephrostomy catheter since the prior CT, with resolution of hydronephrosis. Mild right-sided hydroureteronephrosis is similar. Ureteric prominence followed to the level of the urinary bladder. Distal left ureteric stone is approximately 2 cm proximal to the  bladder and measures 5 mm (image 86, series 2). Similar in position to on the prior. Bladder decompressed around a Foley catheter. Soft tissue fullness throughout is consistent with the clinical history of bladder carcinoma. There is irregularity in the pericystic fat, suggesting direct tumor extension. Soft tissue density also extends anteriorly toward the median umbilical ligament (image 93, series 2). This is grossly similar and suspicious for tumor involvement. The fat plane between the seminal vesicles and posterior wall bladder is ill-defined on image 92. Soft tissue fullness in the region of the prostatectomy bed on image 97 may represent direct tumor extension. No pelvic sidewall extension. Stomach/Bowel: Normal stomach,  without wall thickening. Normal colon and terminal ileum. Normal small bowel. Vascular/Lymphatic: Aortic and branch vessel atherosclerosis. Non aneurysmal dilatation of the infrarenal aorta 2.5 cm. Similar small retroperitoneal nodes, without abdominal adenopathy. Left external iliac index node measures 10 mm on image 82 and is unchanged. Reproductive: prostatectomy. Soft tissue fullness within the prostatectomy bed is likely due to direct bladder extension. Other: No significant free fluid. Fat containing inguinal hernias are greater on the right. No evidence of omental or peritoneal disease. Musculoskeletal: Trace L3-4 anterolisthesis. Degenerative disc disease at this level. IMPRESSION: 1. Placement of a left nephrostomy catheter with resolution of left-sided hydronephrosis. Persistent right-sided hydroureteronephrosis, presumably secondary to bladder process. 2. Left distal ureteric stone, as before. 3. Bladder decompressed around a Foley catheter. Likely locally advanced bladder carcinoma with soft tissue fullness extending about the bladder. Similar mild left pelvic adenopathy, suspicious for nodal metastasis. 4. Grossly similar right hepatic lobe low-density lesion, suboptimally evaluated secondary to lack of contrast. 5. Pulmonary artery enlargement suggests pulmonary arterial hypertension. 6.  Atherosclerosis, including within the coronary arteries. Electronically Signed   By: Abigail Miyamoto M.D.   On: 03/14/2015 18:04   Dg Chest 2 View  03/17/2015  CLINICAL DATA:  Initial encounter for increasing shortness of breath and palpitation. Without overt EXAM: CHEST  2 VIEW COMPARISON:  03/14/2015. FINDINGS: AP and lateral views of the chest show low volumes with asymmetric elevation of the right hemidiaphragm, unchanged in the interval. No pulmonary edema or focal airspace consolidation. No pleural effusion. Cardiopericardial silhouette is at upper limits of normal for size. Imaged bony structures of the thorax  are intact. Telemetry leads overlie the chest. IMPRESSION: Stable exam.  No acute cardiopulmonary findings. Electronically Signed   By: Misty Stanley M.D.   On: 03/17/2015 16:55   Ir Ivc Filter Plmt / S&i /img Guid/mod Sed  03/16/2015  CLINICAL DATA:  81 year old male with a history of bilateral renal obstruction secondary to carcinoma. He has developed right lower extremity DVT with hematuria and is not a candidate for anti coagulation. EXAM: 1. ULTRASOUND GUIDANCE FOR VASCULAR ACCESS OF THE RIGHT INTERNAL JUGULAR VEIN. 2. IVC VENOGRAM. 3. PERCUTANEOUS IVC FILTER PLACEMENT. ANESTHESIA/SEDATION: None CONTRAST:  CO2 contrast FLUOROSCOPY TIME:  One minutes. PROCEDURE: The procedure, risks, benefits, and alternatives were explained to the patient. Specific risks discussed include bleeding, infection, contrast reaction, renal failure, IVC filter fracture, migration, ileo caval thrombus (3% incidence), need for further procedure, need for further surgery, pulmonary embolism, cardiopulmonary collapse, death. Questions regarding the procedure were encouraged and answered. The patient understands and consents to the procedure. Ultrasound survey was performed with images stored and sent to PACs. The neck was prepped with Betadine in a sterile fashion, and a sterile drape was applied covering the operative field. A sterile gown and sterile gloves were used for the procedure. Local anesthesia was  provided with 1% Lidocaine. A micropuncture needle was used access the right internal jugular vein under ultrasound. With excellent venous blood flow returned, and an .018 micro wire was passed through the needle. Small incision was made with an 11 blade scalpel. The needle was removed, and a micropuncture sheath was placed over the wire. The inner dilator and wire were removed, and an 035 Bentson wire was advanced under fluoroscopy into the IVC. Serial dilation of the soft tissue tract was performed with an 8 [place] dilator and  subsequently a 10 [place] dilator. The delivery sheath for a retrievable Bard Denali filter was passed over the Bentson wire into the IVC. The wire was removed and small contrast was used to confirm IVC location. CO2 IVC cavagram performed. Dilator was removed, and the IVC filter was then delivered, positioned below the lowest renal vein. Catheter was removed. Manual pressure was used for hemostasis. Patient tolerated the procedure well and remained hemodynamically stable throughout. No complications were encountered and no significant blood loss was encounter. COMPLICATIONS: None. FINDINGS: CO2 IVC venography demonstrates a normal caliber IVC with no evidence of thrombus. Renal veins are identified bilaterally. The IVC filter was successfully positioned below the level of the renal veins and is appropriately oriented. This IVC filter has both permanent and retrievable indications. IMPRESSION: Status post placement of retrievable IVC filter. This IVC filter is potentially retrievable. The patient can be assessed for filter retrieval by Interventional Radiology in approximately 8-12 weeks if the patient becomes eligible for anti coagulation. Signed, Dulcy Fanny. Earleen Newport, DO [facility] Electronically Signed   By: Corrie Mckusick D.O.   On: 03/16/2015 18:24   Nm Pulmonary Perf And Vent  03/17/2015  CLINICAL DATA:  Shortness of breath. EXAM: NUCLEAR MEDICINE VENTILATION - PERFUSION LUNG SCAN TECHNIQUE: Ventilation images were obtained in multiple projections using inhaled aerosol Tc-35m DTPA. Perfusion images were obtained in multiple projections after intravenous injection of Tc-34m MAA. RADIOPHARMACEUTICALS:  31.0 Technetium-77m DTPA aerosol inhalation and 4.1 Technetium-63m MAA IV COMPARISON:  Chest x-ray 03/14/2015. FINDINGS: Matching ventilation perfusion defects noted. Indeterminate scan for pulmonary embolus. Elevation right hemidiaphragm is again noted.  IMPRESSION: Indeterminate for pulmonary embolus. Electronically Signed   By: Marcello Moores  Register   On: 03/17/2015 16:41   Dg Chest Port 1 View  03/14/2015  CLINICAL DATA:  Shortness of breath EXAM: PORTABLE CHEST 1 VIEW COMPARISON:  02/15/2015 chest radiograph. FINDINGS: Stable cardiomediastinal silhouette with mild cardiomegaly. No pneumothorax. Possible stable trace right pleural effusion. No appreciable left pleural effusion. No overt pulmonary edema. Stable elevation of the right hemidiaphragm. Stable right basilar curvilinear opacities, favor atelectasis. No new focal lung opacity. IMPRESSION: 1. Stable mild cardiomegaly without overt pulmonary edema. 2. Stable elevation of the right hemidiaphragm with likely stable trace right pleural effusion. 3. Stable curvilinear right basal lung opacities, favor atelectasis. No new focal lung opacity. Electronically Signed   By: Ilona Sorrel M.D.   On: 03/14/2015 13:18   Dg Abd 2 Views  02/20/2015  CLINICAL DATA:  Abdominal pain, follow-up ileus, status post nephrectomy EXAM: ABDOMEN - 2 VIEW COMPARISON:  02/19/2015 FINDINGS: Mild gaseous distention of small and large bowel, compatible with history of postoperative adynamic ileus. No evidence of bowel obstruction. Left percutaneous nephrostomy. Degenerative changes the lumbar spine. IMPRESSION: No evidence of bowel obstruction. Mild gaseous distention of small and large bowel, compatible with history of postoperative adynamic ileus. Electronically Signed   By: Julian Hy M.D.   On: 02/20/2015 12:28  Dg Abd Portable 1v  02/19/2015  CLINICAL DATA:  Abdominal pain, nausea, constipation EXAM: PORTABLE ABDOMEN - 1 VIEW COMPARISON:  None. FINDINGS: Left nephrostomy catheter is noted. Gaseous distended small bowel loops on mid abdomen highly suspicious for significant ileus or partial small bowel obstruction. Stool and gas noted in distal colon. IMPRESSION: Gaseous distended small bowel loops mid abdomen highly  suspicious for significant ileus or bowel obstruction. Electronically Signed   By: Lahoma Crocker M.D.   On: 02/19/2015 14:10     CBC  Recent Labs Lab 03/14/15 1220 03/15/15 0334 03/16/15 0334 03/17/15 0309 03/19/15 0430  WBC 13.6* 15.1* 12.9* 13.7* 11.0*  HGB 13.5 12.3* 11.3* 12.1* 11.4*  HCT 41.9 38.2* 35.6* 37.6* 35.8*  PLT 125* 150 137* 186 179  MCV 92.3 92.7 93.4 93.5 92.5  MCH 29.7 29.9 29.7 30.1 29.5  MCHC 32.2 32.2 31.7 32.2 31.8  RDW 14.2 14.2 14.4 14.4 14.3  LYMPHSABS 1.3  --   --   --   --   MONOABS 0.9  --   --   --   --   EOSABS 0.1  --   --   --   --   BASOSABS 0.0  --   --   --   --     Chemistries   Recent Labs Lab 03/14/15 1220 03/15/15 0334 03/16/15 0334 03/17/15 0309 03/19/15 0430  NA 136 138 140 140 140  K 4.1 4.5 4.5 4.3 4.2  CL 102 106 107 108 105  CO2 22 23 25 23 26   GLUCOSE 203* 107* 97 87 96  BUN 23* 19 24* 19 24*  CREATININE 1.94* 2.03* 2.11* 1.76* 1.86*  CALCIUM 8.2* 8.1* 8.0* 8.2* 8.4*  MG  --  1.9  --   --   --   AST  --   --  16  --   --   ALT  --   --  20  --   --   ALKPHOS  --   --  96  --   --   BILITOT  --   --  0.6  --   --    ------------------------------------------------------------------------------------------------------------------ estimated creatinine clearance is 35.7 mL/min (by C-G formula based on Cr of 1.86). ------------------------------------------------------------------------------------------------------------------ No results for input(s): HGBA1C in the last 72 hours. ------------------------------------------------------------------------------------------------------------------ No results for input(s): CHOL, HDL, LDLCALC, TRIG, CHOLHDL, LDLDIRECT in the last 72 hours. ------------------------------------------------------------------------------------------------------------------ No results for input(s): TSH, T4TOTAL, T3FREE, THYROIDAB in the last 72 hours.  Invalid input(s):  FREET3 ------------------------------------------------------------------------------------------------------------------ No results for input(s): VITAMINB12, FOLATE, FERRITIN, TIBC, IRON, RETICCTPCT in the last 72 hours.  Coagulation profile  Recent Labs Lab 03/17/15 0309  INR 1.23    No results for input(s): DDIMER in the last 72 hours.  Cardiac Enzymes  Recent Labs Lab 03/14/15 2140 03/15/15 0334 03/16/15 0334  TROPONINI 0.72* 0.53* 0.25*   ------------------------------------------------------------------------------------------------------------------ Invalid input(s): POCBNP   CBG:  Recent Labs Lab 03/18/15 1708 03/18/15 2138 03/19/15 0726 03/19/15 1141 03/19/15 1644  GLUCAP 117* 149* 122* 155* 141*       Studies: No results found.    Lab Results  Component Value Date   HGBA1C 7.1* 03/14/2015   HGBA1C 6.5* 05/15/2014   Lab Results  Component Value Date   LDLCALC 117* 10/24/2010   CREATININE 1.86* 03/19/2015       Scheduled Meds: . amiodarone  400 mg Oral BID  . cefTRIAXone (ROCEPHIN)  IV  1 g Intravenous Q24H  . diltiazem  240  mg Oral Daily  . docusate sodium  100 mg Oral BID  . insulin aspart  0-9 Units Subcutaneous TID WC  . multivitamin with minerals  1 tablet Oral Daily  . polyethylene glycol  17 g Oral Daily  . predniSONE  7.5 mg Oral Q breakfast  . senna  1 tablet Oral Daily  . sodium chloride  3 mL Intravenous Q12H  . sodium chloride  3 mL Intravenous Q12H   Continuous Infusions:    Principal Problem:   Atrial fibrillation with RVR (HCC) Active Problems:   HTN (hypertension)   Urothelial carcinoma (HCC)   Rheumatoid arthritis (HCC)   Atrial flutter with rapid ventricular response ([place])   Acute on chronic renal failure ([place])   Hematuria    Time spent: 25 minutes   [facility] Pager [phone]. If 7PM-7AM, please contact night-coverage at [email or web address], password [facility] 03/19/2015, 6:33 PM  LOS: 5  days

## 2015-03-19 NOTE — Progress Notes (Signed)
Patient converted to a.flutter shortly after walk with PT. Patient asymptomatic, VS stable. Scheduled cardizem given . MD and cardiologist notified.  Barbee Shropshire. Brigitte Pulse, RN

## 2015-03-19 NOTE — Progress Notes (Signed)
SATURATION QUALIFICATIONS: (This note is used to comply with regulatory documentation for home oxygen)  Patient Saturations on Room Air at Rest = 94%  Patient Saturations on Room Air while Ambulating = 87%  Patient Saturations on 2 Liters of oxygen while Ambulating = 93%  Please briefly explain why patient needs home oxygen: patients oxygen saturation drops to 87% while ambulating. Patient has visible symptoms of shortness of breath and recovers when oxygen in applied.  Craig Waters. Brigitte Pulse, RN

## 2015-03-20 LAB — GLUCOSE, CAPILLARY
GLUCOSE-CAPILLARY: 113 mg/dL — AB (ref 65–99)
Glucose-Capillary: 169 mg/dL — ABNORMAL HIGH (ref 65–99)

## 2015-03-20 NOTE — Progress Notes (Signed)
OT Cancellation Note  Patient Details Name: Craig Waters. MRN: [id number] DOB: October 05, 1930   Cancelled Treatment:    Reason Eval/Treat Not Completed: Other (comment).  Pt just back to bed; will likely check back Monday.  Chanie Soucek 03/20/2015, 1:23 PM  Lesle Chris, OTR/L [phone] 03/20/2015

## 2015-03-26 ENCOUNTER — Other Ambulatory Visit: Payer: Self-pay | Admitting: Cardiology

## 2015-03-26 DIAGNOSIS — R079 Chest pain, unspecified: Secondary | ICD-10-CM

## 2015-03-29 ENCOUNTER — Encounter (HOSPITAL_COMMUNITY)
Admission: RE | Admit: 2015-03-29 | Discharge: 2015-03-29 | Disposition: A | Discharge status: Home / Self Care | Payer: Medicare Other | Source: Ambulatory Visit | Attending: Cardiology | Admitting: Cardiology

## 2015-03-29 ENCOUNTER — Encounter (HOSPITAL_COMMUNITY): Payer: Medicare Other

## 2015-03-29 DIAGNOSIS — R079 Chest pain, unspecified: Secondary | ICD-10-CM | POA: Diagnosis present

## 2015-03-29 LAB — HEPATIC FUNCTION PANEL
ALT: 17 U/L (ref 17–63)
AST: 25 U/L (ref 15–41)
Albumin: 2.8 g/dL — ABNORMAL LOW (ref 3.5–5.0)
Alkaline Phosphatase: 110 U/L (ref 38–126)
BILIRUBIN DIRECT: 0.2 mg/dL (ref 0.1–0.5)
BILIRUBIN INDIRECT: 0.3 mg/dL (ref 0.3–0.9)
BILIRUBIN TOTAL: 0.5 mg/dL (ref 0.3–1.2)
Total Protein: 6.1 g/dL — ABNORMAL LOW (ref 6.5–8.1)

## 2015-03-29 LAB — BASIC METABOLIC PANEL
ANION GAP: 8 (ref 5–15)
BUN: 21 mg/dL — ABNORMAL HIGH (ref 6–20)
CALCIUM: 8.5 mg/dL — AB (ref 8.9–10.3)
CO2: 27 mmol/L (ref 22–32)
CREATININE: 1.98 mg/dL — AB (ref 0.61–1.24)
Chloride: 104 mmol/L (ref 101–111)
GFR calc Af Amer: 34 mL/min — ABNORMAL LOW (ref >60)
GFR, EST NON AFRICAN AMERICAN: 30 mL/min — AB (ref >60)
GLUCOSE: 139 mg/dL — AB (ref 65–99)
Potassium: 4.2 mmol/L (ref 3.5–5.1)
Sodium: 139 mmol/L (ref 135–145)

## 2015-03-29 LAB — LIPID PANEL
CHOLESTEROL: 174 mg/dL (ref 0–200)
HDL: 32 mg/dL — AB (ref >40)
LDL Cholesterol: 121 mg/dL — ABNORMAL HIGH (ref 0–99)
TRIGLYCERIDES: 106 mg/dL (ref <150)
Total CHOL/HDL Ratio: 5.4 RATIO
VLDL: 21 mg/dL (ref 0–40)

## 2015-03-29 MED ORDER — REGADENOSON 0.4 MG/5ML IV SOLN: 0.4000 mg | Freq: Once | INTRAVENOUS | Status: DC

## 2015-03-29 MED ORDER — TECHNETIUM TC 99M SESTAMIBI GENERIC - CARDIOLITE
10.0000 | Freq: Once | INTRAVENOUS | Status: AC | PRN
  Administered 2015-03-29: 10 via INTRAVENOUS

## 2015-03-29 MED ORDER — TECHNETIUM TC 99M SESTAMIBI - CARDIOLITE
30.0000 | Freq: Once | INTRAVENOUS | Status: AC | PRN
  Administered 2015-03-29: 10:00:00 30 via INTRAVENOUS

## 2015-03-29 MED ORDER — REGADENOSON 0.4 MG/5ML IV SOLN
INTRAVENOUS | Status: AC
  Filled 2015-03-29: qty 5

## 2015-03-30 LAB — NM MYOCAR MULTI W/SPECT W/WALL MOTION / EF
CHL CUP STRESS STAGE 1 DBP: 79 mmHg
CHL CUP STRESS STAGE 1 GRADE: 0 %
CHL CUP STRESS STAGE 1 HR: 81 {beats}/min
CHL CUP STRESS STAGE 2 GRADE: 0 %
CHL CUP STRESS STAGE 3 GRADE: 0 %
CHL CUP STRESS STAGE 3 HR: 87 {beats}/min
CHL CUP STRESS STAGE 4 GRADE: 0 %
CHL CUP STRESS STAGE 4 HR: 85 {beats}/min
CHL CUP STRESS STAGE 4 SBP: 144 mmHg
CHL CUP STRESS STAGE 4 SPEED: 0 mph
Estimated workload: 1 METS
Peak BP: 144 mmHg
Peak HR: 85 {beats}/min
Percent of predicted max HR: 61 %
Stage 1 SBP: 157 mmHg
Stage 1 Speed: 0 mph
Stage 2 HR: 81 {beats}/min
Stage 2 Speed: 0 mph
Stage 3 DBP: 63 mmHg
Stage 3 SBP: 146 mmHg
Stage 3 Speed: 0 mph
Stage 4 DBP: 67 mmHg

## 2015-04-04 ENCOUNTER — Emergency Department (HOSPITAL_COMMUNITY): Payer: Medicare Other

## 2015-04-04 ENCOUNTER — Encounter (HOSPITAL_COMMUNITY): Payer: Self-pay

## 2015-04-04 ENCOUNTER — Inpatient Hospital Stay (HOSPITAL_COMMUNITY)
Admission: EM | Admit: 2015-04-04 | Discharge: 2015-04-09 | DRG: 872 | Disposition: A | Discharge status: Home Health | Payer: Medicare Other | Attending: Internal Medicine | Admitting: Internal Medicine

## 2015-04-04 DIAGNOSIS — Z9889 Other specified postprocedural states: Secondary | ICD-10-CM

## 2015-04-04 DIAGNOSIS — K449 Diaphragmatic hernia without obstruction or gangrene: Secondary | ICD-10-CM | POA: Diagnosis present

## 2015-04-04 DIAGNOSIS — Z8249 Family history of ischemic heart disease and other diseases of the circulatory system: Secondary | ICD-10-CM

## 2015-04-04 DIAGNOSIS — I10 Essential (primary) hypertension: Secondary | ICD-10-CM | POA: Diagnosis present

## 2015-04-04 DIAGNOSIS — Z8711 Personal history of peptic ulcer disease: Secondary | ICD-10-CM

## 2015-04-04 DIAGNOSIS — C679 Malignant neoplasm of bladder, unspecified: Secondary | ICD-10-CM | POA: Diagnosis present

## 2015-04-04 DIAGNOSIS — H409 Unspecified glaucoma: Secondary | ICD-10-CM | POA: Diagnosis present

## 2015-04-04 DIAGNOSIS — K219 Gastro-esophageal reflux disease without esophagitis: Secondary | ICD-10-CM | POA: Diagnosis present

## 2015-04-04 DIAGNOSIS — I480 Paroxysmal atrial fibrillation: Secondary | ICD-10-CM | POA: Diagnosis present

## 2015-04-04 DIAGNOSIS — Z7952 Long term (current) use of systemic steroids: Secondary | ICD-10-CM

## 2015-04-04 DIAGNOSIS — M4644 Discitis, unspecified, thoracic region: Secondary | ICD-10-CM | POA: Diagnosis present

## 2015-04-04 DIAGNOSIS — I82401 Acute embolism and thrombosis of unspecified deep veins of right lower extremity: Secondary | ICD-10-CM | POA: Diagnosis present

## 2015-04-04 DIAGNOSIS — N179 Acute kidney failure, unspecified: Secondary | ICD-10-CM | POA: Diagnosis present

## 2015-04-04 DIAGNOSIS — E872 Acidosis: Secondary | ICD-10-CM | POA: Diagnosis present

## 2015-04-04 DIAGNOSIS — Z803 Family history of malignant neoplasm of breast: Secondary | ICD-10-CM

## 2015-04-04 DIAGNOSIS — M069 Rheumatoid arthritis, unspecified: Secondary | ICD-10-CM | POA: Diagnosis present

## 2015-04-04 DIAGNOSIS — R0602 Shortness of breath: Secondary | ICD-10-CM | POA: Diagnosis present

## 2015-04-04 DIAGNOSIS — N390 Urinary tract infection, site not specified: Secondary | ICD-10-CM | POA: Diagnosis present

## 2015-04-04 DIAGNOSIS — K5900 Constipation, unspecified: Secondary | ICD-10-CM | POA: Diagnosis present

## 2015-04-04 DIAGNOSIS — M1990 Unspecified osteoarthritis, unspecified site: Secondary | ICD-10-CM | POA: Diagnosis present

## 2015-04-04 DIAGNOSIS — R509 Fever, unspecified: Secondary | ICD-10-CM | POA: Diagnosis not present

## 2015-04-04 DIAGNOSIS — Z86718 Personal history of other venous thrombosis and embolism: Secondary | ICD-10-CM

## 2015-04-04 DIAGNOSIS — C689 Malignant neoplasm of urinary organ, unspecified: Secondary | ICD-10-CM | POA: Diagnosis present

## 2015-04-04 DIAGNOSIS — A419 Sepsis, unspecified organism: Principal | ICD-10-CM | POA: Diagnosis present

## 2015-04-04 DIAGNOSIS — R319 Hematuria, unspecified: Secondary | ICD-10-CM | POA: Diagnosis present

## 2015-04-04 DIAGNOSIS — Z87891 Personal history of nicotine dependence: Secondary | ICD-10-CM

## 2015-04-04 DIAGNOSIS — Z79899 Other long term (current) drug therapy: Secondary | ICD-10-CM

## 2015-04-04 DIAGNOSIS — N429 Disorder of prostate, unspecified: Secondary | ICD-10-CM | POA: Diagnosis present

## 2015-04-04 DIAGNOSIS — I2510 Atherosclerotic heart disease of native coronary artery without angina pectoris: Secondary | ICD-10-CM | POA: Diagnosis present

## 2015-04-04 DIAGNOSIS — R42 Dizziness and giddiness: Secondary | ICD-10-CM | POA: Diagnosis present

## 2015-04-04 DIAGNOSIS — Z9689 Presence of other specified functional implants: Secondary | ICD-10-CM | POA: Diagnosis present

## 2015-04-04 LAB — BASIC METABOLIC PANEL
ANION GAP: 10 (ref 5–15)
BUN: 22 mg/dL — ABNORMAL HIGH (ref 6–20)
CHLORIDE: 102 mmol/L (ref 101–111)
CO2: 23 mmol/L (ref 22–32)
Calcium: 8.4 mg/dL — ABNORMAL LOW (ref 8.9–10.3)
Creatinine, Ser: 1.91 mg/dL — ABNORMAL HIGH (ref 0.61–1.24)
GFR calc Af Amer: 36 mL/min — ABNORMAL LOW (ref >60)
GFR, EST NON AFRICAN AMERICAN: 31 mL/min — AB (ref >60)
Glucose, Bld: 147 mg/dL — ABNORMAL HIGH (ref 65–99)
POTASSIUM: 4.3 mmol/L (ref 3.5–5.1)
SODIUM: 135 mmol/L (ref 135–145)

## 2015-04-04 LAB — URINE MICROSCOPIC-ADD ON: Bacteria, UA: NONE SEEN

## 2015-04-04 LAB — URINALYSIS, ROUTINE W REFLEX MICROSCOPIC
BILIRUBIN URINE: NEGATIVE
Glucose, UA: NEGATIVE mg/dL
KETONES UR: NEGATIVE mg/dL
NITRITE: NEGATIVE
PH: 7.5 (ref 5.0–8.0)
Protein, ur: 100 mg/dL — AB
SPECIFIC GRAVITY, URINE: 1.017 (ref 1.005–1.030)

## 2015-04-04 LAB — CBC
HCT: 36.7 % — ABNORMAL LOW (ref 39.0–52.0)
HEMOGLOBIN: 11.7 g/dL — AB (ref 13.0–17.0)
MCH: 29.5 pg (ref 26.0–34.0)
MCHC: 31.9 g/dL (ref 30.0–36.0)
MCV: 92.4 fL (ref 78.0–100.0)
PLATELETS: 258 10*3/uL (ref 150–400)
RBC: 3.97 MIL/uL — AB (ref 4.22–5.81)
RDW: 14.4 % (ref 11.5–15.5)
WBC: 18.3 10*3/uL — AB (ref 4.0–10.5)

## 2015-04-04 LAB — I-STAT CG4 LACTIC ACID, ED: LACTIC ACID, VENOUS: 2.54 mmol/L — AB (ref 0.5–2.0)

## 2015-04-04 NOTE — ED Notes (Signed)
Pt discharged from [facility] last Wed.Complaining of nausea and fever since discharge. Has been taking Zofran and Tylenol at home. Had bladder surgery last Tuesday. Denies pain and vomiting. Foley draining bloody urine. Ankles swollen upon assessment.

## 2015-04-04 NOTE — ED Provider Notes (Signed)
CSN: [id number]     Arrival date & time 04/04/15  2225 History  By signing my name below, I, Craig Waters, attest that this documentation has been prepared under the direction and in the presence of Asuncion Shibata, MD. Electronically Signed: Helane Waters, ED Scribe. 04/05/2015. 2:40 AM.    Chief Complaint  Patient presents with  . Fever   Patient is a 81 y.o. male presenting with fever. The history is provided by the patient and a relative. No language interpreter was used.  Fever Max temp prior to arrival:  100.1 Onset quality:  Gradual Duration:  4 days Timing:  Constant Progression:  Unchanged Chronicity:  New Relieved by:  Acetaminophen Associated symptoms: nausea   Associated symptoms: no congestion, no cough and no dysuria   Risk factors: recent surgery    HPI Comments: Craig Waters. is a 81 y.o. male who presents to the Emergency Department complaining of fever (Tmax 100.1) onset 4 days ago. Per relative, pt has associated nausea, SOB, weakness, possible dehydration, and difficulty urinating. She notes pt has been given Tylenol with temporary relief.She reports pt had bladder surgery at [facility] 5 days ago, where he was not given any antibiotics. She notes he was discharged from [facility] 4 days ago, and states pt is to take 400 mg amiodarone twice daily for the next 2 weeks. She notes she has not contacted pt's surgeon about this yet. She also reports pt having a PMHx of renal failure and A-fib. Pt denies dysuria, cough, and congestion.   Past Medical History  Diagnosis Date  . Blood in stool   . Diverticulitis   . GERD (gastroesophageal reflux disease)   . Glaucoma   . Ulcer   . History of transfusion of whole blood   . Prostate disorder   . Shortness of breath 10-18-11    with exertion, cardiac cath done 7-8 yrs ago negative.  . Vertigo 10-18-11    inner ear issues occ. -tx. Bonine as needed  . Arthritis 10-18-11    back, fingers  . H/O hiatal hernia 10-18-11     noted on recent CXR  . Adenomatous colon polyp   . Urothelial carcinoma ([place]) 05/2014  . PAF (paroxysmal atrial fibrillation) ([place])   . Rheumatoid arthritis ([place])   . Discitis of thoracic region 07/04/2012   Past Surgical History  Procedure Laterality Date  . Gastric ulcer  10-18-11    '91-blleding ulcer with blood transfusions after  . Appendectomy  10-18-11    age 69  . Cataract extraction, bilateral  10-18-11    bilateral   . Eye surgery  10-18-11    laser eye surgery s/p cataract bilaterally  . Cardiac catheterization  10-18-11    7-8 yrs ago-mild blockage,no tx. indicated  . Prostatectomy  10/27/2011    Procedure: PROSTATECTOMY SUPRAPUBIC;  Surgeon: Ailene Rud, MD;  Location: [facility] ORS;  Service: Urology;  Laterality: N/A;  Placement of suprapubic tube  . Cystoscopy  10/27/2011    Procedure: CYSTOSCOPY FLEXIBLE;  Surgeon: Ailene Rud, MD;  Location: [facility] ORS;  Service: Urology;  Laterality: N/A;  . Transurethral resection of bladder tumor with gyrus (turbt-gyrus)  06/2014    T1 HG tumor.  [facility].   Family History  Problem Relation Age of Onset  . Breast cancer Sister   . Dementia Mother   . Heart attack Father   . Colon cancer Neg Hx    Social History  Substance Use Topics  . Smoking status: Former  Smoker -- 1.00 packs/day for 5 years    Types: Cigarettes    Quit date: 09/26/1978  . Smokeless tobacco: Current User    Types: Chew    Last Attempt to Quit: 11/01/2011  . Alcohol Use: No    Review of Systems  Constitutional: Positive for fever.  HENT: Negative for congestion.   Respiratory: Positive for shortness of breath. Negative for cough.   Gastrointestinal: Positive for nausea.  Genitourinary: Positive for difficulty urinating. Negative for dysuria.  Neurological: Positive for weakness.  All other systems reviewed and are negative.   Allergies  Review of patient's allergies indicates no known allergies.  Home Medications   Prior to Admission  medications   Medication Sig Start Date End Date Taking? Authorizing Provider  acetaminophen (TYLENOL) 650 MG CR tablet Take 1,300 mg by mouth every 6 (six) hours as needed for pain.   Yes Historical Provider, MD  acetaminophen-codeine (TYLENOL #3) 300-30 MG tablet Take 1 tablet by mouth every 4 (four) hours as needed for moderate pain.   Yes Historical Provider, MD  amiodarone (PACERONE) 200 MG tablet Take 1 tablet (200 mg total) by mouth 2 (two) times daily. Patient taking differently: Take 200-400 mg by mouth 2 (two) times daily. 400 mg twice daily for 1 week (completing on 03/07/15) then resume 200 mg twice daily thereafter. 03/26/15  Yes Hosie Poisson, MD  atorvastatin (LIPITOR) 20 MG tablet Take 10 mg by mouth daily. 03/29/15  Yes Historical Provider, MD  b complex vitamins tablet Take 1 tablet by mouth daily.   Yes Historical Provider, MD  Calcium Citrate-Vitamin D (CALCIUM + D PO) Take 1 tablet by mouth daily.   Yes Historical Provider, MD  diltiazem (CARDIZEM CD) 240 MG 24 hr capsule Take 1 capsule (240 mg total) by mouth daily. 03/19/15  Yes Hosie Poisson, MD  hyoscyamine (LEVSIN SL) 0.125 MG SL tablet Take 0.125 mg by mouth every 4 (four) hours as needed (bladder spasms).  02/01/15  Yes Historical Provider, MD  magnesium hydroxide (MILK OF MAGNESIA) 400 MG/5ML suspension Take 30 mLs by mouth daily as needed. For constipation.   Yes Historical Provider, MD  Multiple Vitamin (MULTIVITAMIN WITH MINERALS) TABS tablet Take 1 tablet by mouth daily. 05/15/14  Yes Estela Leonie Green, MD  ondansetron (ZOFRAN) 4 MG tablet Take 1 tablet (4 mg total) by mouth every 6 (six) hours as needed for nausea. 02/22/15  Yes Donne Hazel, MD  polyethylene glycol powder (GLYCOLAX/MIRALAX) powder Take 255 g by mouth daily. Patient taking differently: Take 17 g by mouth daily as needed (for constipation).  11/27/14  Yes Willia Craze, NP  predniSONE (DELTASONE) 5 MG tablet Take 5 mg by mouth daily with  breakfast. .   Yes Historical Provider, MD  senna (SENOKOT) 8.6 MG tablet Take 1 tablet by mouth daily as needed for constipation.    Yes Historical Provider, MD  traMADol (ULTRAM) 50 MG tablet Take 50 mg by mouth every 6 (six) hours as needed for moderate pain.   Yes Historical Provider, MD  oxyCODONE-acetaminophen (PERCOCET/ROXICET) 5-325 MG tablet Take 1 tablet by mouth every 4 (four) hours as needed for moderate pain. Patient not taking: Reported on 04/04/2015 02/22/15   Donne Hazel, MD   BP 151/68 mmHg  Pulse 73  Temp(Src) 98.9 F (37.2 C) (Oral)  Resp 20  SpO2 92% Physical Exam  Constitutional: He is oriented to person, place, and time. He appears well-developed and well-nourished.  HENT:  Head: Normocephalic and  atraumatic.  Mouth/Throat: Oropharynx is clear and moist. No oropharyngeal exudate.  Uvula is midline  Eyes: Conjunctivae are normal. Pupils are equal, round, and reactive to light. Right eye exhibits no discharge. Left eye exhibits no discharge.  Neck: Normal range of motion. Neck supple. No JVD present.  Cardiovascular: Normal rate, regular rhythm, normal heart sounds and intact distal pulses.   Pulmonary/Chest: Effort normal. No accessory muscle usage or stridor. No respiratory distress. He has decreased breath sounds.  Abdominal: Soft. He exhibits no distension and no mass. There is no tenderness. There is no rebound and no guarding.  Hyperactive bowel sounds  Musculoskeletal: Normal range of motion. He exhibits edema.  All compartments are soft, no corts  Neurological: He is alert and oriented to person, place, and time. He has normal reflexes. Coordination normal.  Good DTR's throughout (2+)  Skin: Skin is warm and dry. No rash noted. He is not diaphoretic. No erythema.  Psychiatric: He has a normal mood and affect.  Nursing note and vitals reviewed.   ED Course  Procedures  DIAGNOSTIC STUDIES: Oxygen Saturation is 93% on RA, low by my interpretation.     COORDINATION OF CARE: 11:14 PM - Discussed plans to wait on diagnostic studies. Pt advised of plan for treatment and pt agrees.  Labs Review Labs Reviewed  URINALYSIS, ROUTINE W REFLEX MICROSCOPIC (NOT AT [facility]) - Abnormal; Notable for the following:    Color, Urine RED (*)    APPearance TURBID (*)    Hgb urine dipstick LARGE (*)    Protein, ur 100 (*)    Leukocytes, UA MODERATE (*)    All other components within normal limits  CBC - Abnormal; Notable for the following:    WBC 18.3 (*)    RBC 3.97 (*)    Hemoglobin 11.7 (*)    HCT 36.7 (*)    All other components within normal limits  BASIC METABOLIC PANEL - Abnormal; Notable for the following:    Glucose, Bld 147 (*)    BUN 22 (*)    Creatinine, Ser 1.91 (*)    Calcium 8.4 (*)    GFR calc non Af Amer 31 (*)    GFR calc Af Amer 36 (*)    All other components within normal limits  URINE MICROSCOPIC-ADD ON - Abnormal; Notable for the following:    Squamous Epithelial / LPF 0-5 (*)    All other components within normal limits  I-STAT CG4 LACTIC ACID, ED - Abnormal; Notable for the following:    Lactic Acid, Venous 2.54 (*)    All other components within normal limits  URINE CULTURE  CULTURE, BLOOD (ROUTINE X 2)  CULTURE, BLOOD (ROUTINE X 2)  I-STAT CG4 LACTIC ACID, ED  Randolm Idol, ED    Imaging Review Dg Chest 2 View  04/05/2015  CLINICAL DATA:  Postoperative radiograph, status post bladder surgery 6 days ago. Nausea, fever and shortness breath on exertion. Initial encounter. EXAM: CHEST  2 VIEW COMPARISON:  Chest radiograph performed 03/17/2015 FINDINGS: There is elevation of the right hemidiaphragm. Mild bibasilar atelectasis is suspected. There is no evidence of pleural effusion or pneumothorax. The heart is borderline enlarged. No acute osseous abnormalities are seen. An IVC filter and nephrostomy catheter are partially imaged. IMPRESSION: Elevation of the right hemidiaphragm. Mild bibasilar atelectasis suspected.  Borderline cardiomegaly. Electronically Signed   By: Garald Balding M.D.   On: 04/05/2015 00:21   I have personally reviewed and evaluated these images and lab results as part  of my medical decision-making.   EKG Interpretation None      MDM   Final diagnoses:  None  Per Dr. Arelia Longest at [facility] check bladder scan and can admit at [facility] facility   Results for orders placed or performed during the hospital encounter of 04/04/15  Urinalysis, Routine w reflex microscopic-may I&O cath if menses (not at [facility])  Result Value Ref Range   Color, Urine RED (A) YELLOW   APPearance TURBID (A) CLEAR   Specific Gravity, Urine 1.017 1.005 - 1.030   pH 7.5 5.0 - 8.0   Glucose, UA NEGATIVE NEGATIVE mg/dL   Hgb urine dipstick LARGE (A) NEGATIVE   Bilirubin Urine NEGATIVE NEGATIVE   Ketones, ur NEGATIVE NEGATIVE mg/dL   Protein, ur 100 (A) NEGATIVE mg/dL   Nitrite NEGATIVE NEGATIVE   Leukocytes, UA MODERATE (A) NEGATIVE  CBC  Result Value Ref Range   WBC 18.3 (H) 4.0 - 10.5 K/uL   RBC 3.97 (L) 4.22 - 5.81 MIL/uL   Hemoglobin 11.7 (L) 13.0 - 17.0 g/dL   HCT 36.7 (L) 39.0 - 52.0 %   MCV 92.4 78.0 - 100.0 fL   MCH 29.5 26.0 - 34.0 pg   MCHC 31.9 30.0 - 36.0 g/dL   RDW 14.4 11.5 - 15.5 %   Platelets 258 150 - 400 K/uL  Basic metabolic panel  Result Value Ref Range   Sodium 135 135 - 145 mmol/L   Potassium 4.3 3.5 - 5.1 mmol/L   Chloride 102 101 - 111 mmol/L   CO2 23 22 - 32 mmol/L   Glucose, Bld 147 (H) 65 - 99 mg/dL   BUN 22 (H) 6 - 20 mg/dL   Creatinine, Ser 1.91 (H) 0.61 - 1.24 mg/dL   Calcium 8.4 (L) 8.9 - 10.3 mg/dL   GFR calc non Af Amer 31 (L) >60 mL/min   GFR calc Af Amer 36 (L) >60 mL/min   Anion gap 10 5 - 15  Urine microscopic-add on  Result Value Ref Range   Squamous Epithelial / LPF 0-5 (A) NONE SEEN   WBC, UA TOO NUMEROUS TO COUNT 0 - 5 WBC/hpf   RBC / HPF TOO NUMEROUS TO COUNT 0 - 5 RBC/hpf   Bacteria, UA NONE SEEN NONE SEEN   Urine-Other URINALYSIS PERFORMED ON  SUPERNATANT   I-Stat CG4 Lactic Acid, ED  (not at [facility])  Result Value Ref Range   Lactic Acid, Venous 2.54 (HH) 0.5 - 2.0 mmol/L   Comment NOTIFIED PHYSICIAN   I-Stat CG4 Lactic Acid, ED  (not at [facility])  Result Value Ref Range   Lactic Acid, Venous 0.86 0.5 - 2.0 mmol/L  I-stat troponin, ED  Result Value Ref Range   Troponin i, poc 0.02 0.00 - 0.08 ng/mL   Comment 3           Ct Abdomen Pelvis Wo Contrast  03/14/2015  CLINICAL DATA:  Acute on chronic renal failure. Prostatectomy. Hypertension. Uroepithelial cancer diagnosed 2/16. Left nephrostomy tube. Possible urinary tract infection. EXAM: CT ABDOMEN AND PELVIS WITHOUT CONTRAST TECHNIQUE: Multidetector CT imaging of the abdomen and pelvis was performed following the standard protocol without IV contrast. COMPARISON:  Plain films 02/20/2015.  Most recent CT 02/15/2015. FINDINGS: Lower chest: Volume loss with probable scarring at the anterior left lung base. Mild cardiomegaly with LAD coronary artery atherosclerosis. Right pulmonary artery enlargement at 3.5 cm (image 1, series 2). Right hemidiaphragm elevation. Hepatobiliary: Suboptimal evaluation of the hypo attenuating posterior right hepatic lobe lesion secondary lack  of IV contrast. Measures on the order of 4.2 cm in anterior posterior dimension today versus similar (when remeasured). No definite new liver lesions identified. Normal gallbladder, without biliary ductal dilatation. Pancreas: Mild pancreatic atrophy, within normal variation for age. No duct dilatation. Spleen: Normal in size, without focal abnormality. Adrenals/Urinary Tract: Normal adrenal glands. Low-density bilateral renal lesions are likely cysts. Bilateral renal cortical thinning. Placement of a left-sided nephrostomy catheter since the prior CT, with resolution of hydronephrosis. Mild right-sided hydroureteronephrosis is similar. Ureteric prominence followed to the level of the urinary bladder. Distal left ureteric stone is  approximately 2 cm proximal to the bladder and measures 5 mm (image 86, series 2). Similar in position to on the prior. Bladder decompressed around a Foley catheter. Soft tissue fullness throughout is consistent with the clinical history of bladder carcinoma. There is irregularity in the pericystic fat, suggesting direct tumor extension. Soft tissue density also extends anteriorly toward the median umbilical ligament (image 93, series 2). This is grossly similar and suspicious for tumor involvement. The fat plane between the seminal vesicles and posterior wall bladder is ill-defined on image 92. Soft tissue fullness in the region of the prostatectomy bed on image 97 may represent direct tumor extension. No pelvic sidewall extension. Stomach/Bowel: Normal stomach, without wall thickening. Normal colon and terminal ileum. Normal small bowel. Vascular/Lymphatic: Aortic and branch vessel atherosclerosis. Non aneurysmal dilatation of the infrarenal aorta 2.5 cm. Similar small retroperitoneal nodes, without abdominal adenopathy. Left external iliac index node measures 10 mm on image 82 and is unchanged. Reproductive: prostatectomy. Soft tissue fullness within the prostatectomy bed is likely due to direct bladder extension. Other: No significant free fluid. Fat containing inguinal hernias are greater on the right. No evidence of omental or peritoneal disease. Musculoskeletal: Trace L3-4 anterolisthesis. Degenerative disc disease at this level. IMPRESSION: 1. Placement of a left nephrostomy catheter with resolution of left-sided hydronephrosis. Persistent right-sided hydroureteronephrosis, presumably secondary to bladder process. 2. Left distal ureteric stone, as before. 3. Bladder decompressed around a Foley catheter. Likely locally advanced bladder carcinoma with soft tissue fullness extending about the bladder. Similar mild left pelvic adenopathy, suspicious for nodal metastasis. 4. Grossly similar right hepatic lobe  low-density lesion, suboptimally evaluated secondary to lack of contrast. 5. Pulmonary artery enlargement suggests pulmonary arterial hypertension. 6.  Atherosclerosis, including within the coronary arteries. Electronically Signed   By: Abigail Miyamoto M.D.   On: 03/14/2015 18:04   Dg Chest 2 View  04/05/2015  CLINICAL DATA:  Postoperative radiograph, status post bladder surgery 6 days ago. Nausea, fever and shortness breath on exertion. Initial encounter. EXAM: CHEST  2 VIEW COMPARISON:  Chest radiograph performed 03/17/2015 FINDINGS: There is elevation of the right hemidiaphragm. Mild bibasilar atelectasis is suspected. There is no evidence of pleural effusion or pneumothorax. The heart is borderline enlarged. No acute osseous abnormalities are seen. An IVC filter and nephrostomy catheter are partially imaged. IMPRESSION: Elevation of the right hemidiaphragm. Mild bibasilar atelectasis suspected. Borderline cardiomegaly. Electronically Signed   By: Garald Balding M.D.   On: 04/05/2015 00:21   Dg Chest 2 View  03/17/2015  CLINICAL DATA:  Initial encounter for increasing shortness of breath and palpitation. Without overt EXAM: CHEST  2 VIEW COMPARISON:  03/14/2015. FINDINGS: AP and lateral views of the chest show low volumes with asymmetric elevation of the right hemidiaphragm, unchanged in the interval. No pulmonary edema or focal airspace consolidation. No pleural effusion. Cardiopericardial silhouette is at upper limits of normal for size. Imaged bony structures  of the thorax are intact. Telemetry leads overlie the chest. IMPRESSION: Stable exam.  No acute cardiopulmonary findings. Electronically Signed   By: Misty Stanley M.D.   On: 03/17/2015 16:55   Ir Ivc Filter Plmt / S&i /img Guid/mod Sed  03/16/2015  CLINICAL DATA:  81 year old male with a history of bilateral renal obstruction secondary to carcinoma. He has developed right lower extremity DVT with hematuria and is not a candidate for anti  coagulation. EXAM: 1. ULTRASOUND GUIDANCE FOR VASCULAR ACCESS OF THE RIGHT INTERNAL JUGULAR VEIN. 2. IVC VENOGRAM. 3. PERCUTANEOUS IVC FILTER PLACEMENT. ANESTHESIA/SEDATION: None CONTRAST:  CO2 contrast FLUOROSCOPY TIME:  One minutes. PROCEDURE: The procedure, risks, benefits, and alternatives were explained to the patient. Specific risks discussed include bleeding, infection, contrast reaction, renal failure, IVC filter fracture, migration, ileo caval thrombus (3% incidence), need for further procedure, need for further surgery, pulmonary embolism, cardiopulmonary collapse, death. Questions regarding the procedure were encouraged and answered. The patient understands and consents to the procedure. Ultrasound survey was performed with images stored and sent to PACs. The neck was prepped with Betadine in a sterile fashion, and a sterile drape was applied covering the operative field. A sterile gown and sterile gloves were used for the procedure. Local anesthesia was provided with 1% Lidocaine. A micropuncture needle was used access the right internal jugular vein under ultrasound. With excellent venous blood flow returned, and an .018 micro wire was passed through the needle. Small incision was made with an 11 blade scalpel. The needle was removed, and a micropuncture sheath was placed over the wire. The inner dilator and wire were removed, and an 035 Bentson wire was advanced under fluoroscopy into the IVC. Serial dilation of the soft tissue tract was performed with an 8 [place] dilator and subsequently a 10 [place] dilator. The delivery sheath for a retrievable Bard Denali filter was passed over the Bentson wire into the IVC. The wire was removed and small contrast was used to confirm IVC location. CO2 IVC cavagram performed. Dilator was removed, and the IVC filter was then delivered, positioned below the lowest renal vein. Catheter was removed. Manual pressure was used for hemostasis. Patient tolerated the procedure  well and remained hemodynamically stable throughout. No complications were encountered and no significant blood loss was encounter. COMPLICATIONS: None. FINDINGS: CO2 IVC venography demonstrates a normal caliber IVC with no evidence of thrombus. Renal veins are identified bilaterally. The IVC filter was successfully positioned below the level of the renal veins and is appropriately oriented. This IVC filter has both permanent and retrievable indications. IMPRESSION: Status post placement of retrievable IVC filter. This IVC filter is potentially retrievable. The patient can be assessed for filter retrieval by Interventional Radiology in approximately 8-12 weeks if the patient becomes eligible for anti coagulation. Signed, Dulcy Fanny. Earleen Newport, DO [facility] Radiology Electronically Signed   By: Corrie Mckusick D.O.   On: 03/16/2015 18:24   Nm Myocar Multi W/spect W/wall Motion / Ef  03/29/2015  CLINICAL DATA:  Chest pain. Atrial fibrillation. Prior cardiac catheterization. Coronary artery disease. Shortness of breath. EXAM: MYOCARDIAL IMAGING WITH SPECT (REST AND PHARMACOLOGIC-STRESS) GATED LEFT VENTRICULAR WALL MOTION STUDY LEFT VENTRICULAR EJECTION FRACTION TECHNIQUE: Standard myocardial SPECT imaging was performed after resting intravenous injection of 10 mCi Tc-69m sestamibi. Subsequently, intravenous infusion of Lexiscan was performed under the supervision of the Cardiology staff. At peak effect of the drug, 30 mCi Tc-1m sestamibi was injected intravenously and standard myocardial SPECT imaging was performed. Quantitative  gated imaging was also performed to evaluate left ventricular wall motion, and estimate left ventricular ejection fraction. COMPARISON:  Chest radiograph from 03/17/2015 FINDINGS: Perfusion: Reduced inferolateral wall activity and reduced inferior wall activity at the cardiac base, moderate-sized defect, similar on stress and rest imaging, favor  scar over diaphragmatic attenuation. Wall Motion: Normal left ventricular wall motion. No left ventricular dilation. Left Ventricular Ejection Fraction: 58 % End diastolic volume 91 ml End systolic volume 35 ml IMPRESSION: 1. Moderate reduced activity on stress and rest imaging in the inferolateral wall especially at the cardiac base, favor scar overt diaphragmatic attenuation. However, there is reasonably normal wall thickening and wall motion in this vicinity. . 2. Left ventricular ejection fraction 58% 3. Low-risk stress test findings*. *2012 Appropriate Use Criteria for Coronary Revascularization Focused Update: J Am Coll Cardiol. [id number]. http://content.[email or web address].aspx?articleid=[email or web address] Electronically Signed   By: Van Clines M.D.   On: 03/29/2015 12:23   Nm Pulmonary Perf And Vent  03/17/2015  CLINICAL DATA:  Shortness of breath. EXAM: NUCLEAR MEDICINE VENTILATION - PERFUSION LUNG SCAN TECHNIQUE: Ventilation images were obtained in multiple projections using inhaled aerosol Tc-43m DTPA. Perfusion images were obtained in multiple projections after intravenous injection of Tc-36m MAA. RADIOPHARMACEUTICALS:  31.0 Technetium-23m DTPA aerosol inhalation and 4.1 Technetium-26m MAA IV COMPARISON:  Chest x-ray 03/14/2015. FINDINGS: Matching ventilation perfusion defects noted. Indeterminate scan for pulmonary embolus. Elevation right hemidiaphragm is again noted. IMPRESSION: Indeterminate for pulmonary embolus. Electronically Signed   By: Marcello Moores  Register   On: 03/17/2015 16:41   Dg Chest Port 1 View  03/14/2015  CLINICAL DATA:  Shortness of breath EXAM: PORTABLE CHEST 1 VIEW COMPARISON:  02/15/2015 chest radiograph. FINDINGS: Stable cardiomediastinal silhouette with mild cardiomegaly. No pneumothorax. Possible stable trace right pleural effusion. No appreciable left pleural effusion. No overt pulmonary edema. Stable elevation of the right hemidiaphragm. Stable right basilar  curvilinear opacities, favor atelectasis. No new focal lung opacity. IMPRESSION: 1. Stable mild cardiomegaly without overt pulmonary edema. 2. Stable elevation of the right hemidiaphragm with likely stable trace right pleural effusion. 3. Stable curvilinear right basal lung opacities, favor atelectasis. No new focal lung opacity. Electronically Signed   By: Ilona Sorrel M.D.   On: 03/14/2015 13:18     EKG Interpretation  Date/Time:  Monday April 05 2015 01:54:33 EST Ventricular Rate:  73 PR Interval:  222 QRS Duration: 98 QT Interval:  409 QTC Calculation: 451 R Axis:   2 Text Interpretation:  Sinus rhythm Prolonged PR interval Confirmed by Neos [facility]  MD, Commodore Bellew ([id number]) on 04/05/2015 2:44:58 AM        I personally performed the services described in this documentation, which was scribed in my presence. The recorded information has been reviewed and is accurate.     Veatrice Kells, MD 04/05/15 [phone]

## 2015-04-05 ENCOUNTER — Encounter (HOSPITAL_COMMUNITY): Payer: Self-pay | Admitting: Emergency Medicine

## 2015-04-05 ENCOUNTER — Inpatient Hospital Stay (HOSPITAL_COMMUNITY): Payer: Medicare Other

## 2015-04-05 DIAGNOSIS — Z87891 Personal history of nicotine dependence: Secondary | ICD-10-CM | POA: Diagnosis not present

## 2015-04-05 DIAGNOSIS — K5900 Constipation, unspecified: Secondary | ICD-10-CM | POA: Diagnosis present

## 2015-04-05 DIAGNOSIS — K219 Gastro-esophageal reflux disease without esophagitis: Secondary | ICD-10-CM | POA: Diagnosis present

## 2015-04-05 DIAGNOSIS — R42 Dizziness and giddiness: Secondary | ICD-10-CM | POA: Diagnosis present

## 2015-04-05 DIAGNOSIS — R509 Fever, unspecified: Secondary | ICD-10-CM | POA: Diagnosis present

## 2015-04-05 DIAGNOSIS — R0602 Shortness of breath: Secondary | ICD-10-CM | POA: Diagnosis present

## 2015-04-05 DIAGNOSIS — M069 Rheumatoid arthritis, unspecified: Secondary | ICD-10-CM | POA: Diagnosis present

## 2015-04-05 DIAGNOSIS — I480 Paroxysmal atrial fibrillation: Secondary | ICD-10-CM | POA: Diagnosis present

## 2015-04-05 DIAGNOSIS — M1990 Unspecified osteoarthritis, unspecified site: Secondary | ICD-10-CM | POA: Diagnosis present

## 2015-04-05 DIAGNOSIS — Z79899 Other long term (current) drug therapy: Secondary | ICD-10-CM | POA: Diagnosis not present

## 2015-04-05 DIAGNOSIS — Z9689 Presence of other specified functional implants: Secondary | ICD-10-CM | POA: Diagnosis present

## 2015-04-05 DIAGNOSIS — K449 Diaphragmatic hernia without obstruction or gangrene: Secondary | ICD-10-CM | POA: Diagnosis present

## 2015-04-05 DIAGNOSIS — C689 Malignant neoplasm of urinary organ, unspecified: Secondary | ICD-10-CM | POA: Diagnosis not present

## 2015-04-05 DIAGNOSIS — N429 Disorder of prostate, unspecified: Secondary | ICD-10-CM | POA: Diagnosis present

## 2015-04-05 DIAGNOSIS — Z803 Family history of malignant neoplasm of breast: Secondary | ICD-10-CM | POA: Diagnosis not present

## 2015-04-05 DIAGNOSIS — R319 Hematuria, unspecified: Secondary | ICD-10-CM | POA: Diagnosis not present

## 2015-04-05 DIAGNOSIS — I82401 Acute embolism and thrombosis of unspecified deep veins of right lower extremity: Secondary | ICD-10-CM | POA: Diagnosis present

## 2015-04-05 DIAGNOSIS — E872 Acidosis: Secondary | ICD-10-CM | POA: Diagnosis present

## 2015-04-05 DIAGNOSIS — Z86718 Personal history of other venous thrombosis and embolism: Secondary | ICD-10-CM | POA: Diagnosis not present

## 2015-04-05 DIAGNOSIS — I2510 Atherosclerotic heart disease of native coronary artery without angina pectoris: Secondary | ICD-10-CM | POA: Diagnosis present

## 2015-04-05 DIAGNOSIS — Z9889 Other specified postprocedural states: Secondary | ICD-10-CM | POA: Diagnosis not present

## 2015-04-05 DIAGNOSIS — I10 Essential (primary) hypertension: Secondary | ICD-10-CM

## 2015-04-05 DIAGNOSIS — Z8249 Family history of ischemic heart disease and other diseases of the circulatory system: Secondary | ICD-10-CM | POA: Diagnosis not present

## 2015-04-05 DIAGNOSIS — H409 Unspecified glaucoma: Secondary | ICD-10-CM | POA: Diagnosis present

## 2015-04-05 DIAGNOSIS — N390 Urinary tract infection, site not specified: Secondary | ICD-10-CM | POA: Diagnosis not present

## 2015-04-05 DIAGNOSIS — Z7952 Long term (current) use of systemic steroids: Secondary | ICD-10-CM | POA: Diagnosis not present

## 2015-04-05 DIAGNOSIS — M4644 Discitis, unspecified, thoracic region: Secondary | ICD-10-CM | POA: Diagnosis present

## 2015-04-05 DIAGNOSIS — N179 Acute kidney failure, unspecified: Secondary | ICD-10-CM | POA: Diagnosis not present

## 2015-04-05 DIAGNOSIS — Z8711 Personal history of peptic ulcer disease: Secondary | ICD-10-CM | POA: Diagnosis not present

## 2015-04-05 DIAGNOSIS — C679 Malignant neoplasm of bladder, unspecified: Secondary | ICD-10-CM | POA: Diagnosis present

## 2015-04-05 DIAGNOSIS — A419 Sepsis, unspecified organism: Secondary | ICD-10-CM | POA: Diagnosis present

## 2015-04-05 LAB — COMPREHENSIVE METABOLIC PANEL
ALT: 14 U/L — ABNORMAL LOW (ref 17–63)
ANION GAP: 8 (ref 5–15)
AST: 15 U/L (ref 15–41)
Albumin: 2.6 g/dL — ABNORMAL LOW (ref 3.5–5.0)
Alkaline Phosphatase: 93 U/L (ref 38–126)
BUN: 21 mg/dL — ABNORMAL HIGH (ref 6–20)
CHLORIDE: 101 mmol/L (ref 101–111)
CO2: 26 mmol/L (ref 22–32)
Calcium: 7.9 mg/dL — ABNORMAL LOW (ref 8.9–10.3)
Creatinine, Ser: 1.84 mg/dL — ABNORMAL HIGH (ref 0.61–1.24)
GFR, EST AFRICAN AMERICAN: 38 mL/min — AB (ref >60)
GFR, EST NON AFRICAN AMERICAN: 32 mL/min — AB (ref >60)
Glucose, Bld: 111 mg/dL — ABNORMAL HIGH (ref 65–99)
POTASSIUM: 3.9 mmol/L (ref 3.5–5.1)
SODIUM: 135 mmol/L (ref 135–145)
Total Bilirubin: 0.4 mg/dL (ref 0.3–1.2)
Total Protein: 5.7 g/dL — ABNORMAL LOW (ref 6.5–8.1)

## 2015-04-05 LAB — CBC
HCT: 35.1 % — ABNORMAL LOW (ref 39.0–52.0)
Hemoglobin: 11.2 g/dL — ABNORMAL LOW (ref 13.0–17.0)
MCH: 29.4 pg (ref 26.0–34.0)
MCHC: 31.9 g/dL (ref 30.0–36.0)
MCV: 92.1 fL (ref 78.0–100.0)
PLATELETS: 227 10*3/uL (ref 150–400)
RBC: 3.81 MIL/uL — ABNORMAL LOW (ref 4.22–5.81)
RDW: 14.4 % (ref 11.5–15.5)
WBC: 15.4 10*3/uL — AB (ref 4.0–10.5)

## 2015-04-05 LAB — I-STAT TROPONIN, ED: Troponin i, poc: 0.02 ng/mL (ref 0.00–0.08)

## 2015-04-05 LAB — I-STAT CG4 LACTIC ACID, ED: Lactic Acid, Venous: 0.86 mmol/L (ref 0.5–2.0)

## 2015-04-05 LAB — MRSA PCR SCREENING: MRSA BY PCR: NEGATIVE

## 2015-04-05 MED ORDER — ATORVASTATIN CALCIUM 10 MG PO TABS
10.0000 mg | ORAL_TABLET | Freq: Every day | ORAL | Status: DC
  Administered 2015-04-05 – 2015-04-07 (×2): 10 mg via ORAL
  Filled 2015-04-05 (×3): qty 1

## 2015-04-05 MED ORDER — DILTIAZEM HCL ER COATED BEADS 240 MG PO CP24
240.0000 mg | ORAL_CAPSULE | Freq: Every day | ORAL | Status: DC
  Administered 2015-04-05 – 2015-04-09 (×5): 240 mg via ORAL
  Filled 2015-04-05 (×6): qty 1

## 2015-04-05 MED ORDER — PROCHLORPERAZINE EDISYLATE 5 MG/ML IJ SOLN
10.0000 mg | Freq: Four times a day (QID) | INTRAMUSCULAR | Status: DC | PRN
  Administered 2015-04-05 – 2015-04-06 (×2): 10 mg via INTRAVENOUS
  Filled 2015-04-05 (×2): qty 2

## 2015-04-05 MED ORDER — HYOSCYAMINE SULFATE 0.125 MG SL SUBL
0.1250 mg | SUBLINGUAL_TABLET | SUBLINGUAL | Status: DC | PRN
  Administered 2015-04-07 – 2015-04-09 (×5): 0.125 mg via ORAL
  Filled 2015-04-05 (×6): qty 1

## 2015-04-05 MED ORDER — MORPHINE SULFATE (PF) 2 MG/ML IV SOLN: 2.0000 mg | INTRAVENOUS | Status: DC | PRN

## 2015-04-05 MED ORDER — PREDNISONE 5 MG PO TABS
5.0000 mg | ORAL_TABLET | Freq: Every day | ORAL | Status: DC
  Administered 2015-04-05 – 2015-04-09 (×5): 5 mg via ORAL
  Filled 2015-04-05 (×5): qty 1

## 2015-04-05 MED ORDER — AMIODARONE HCL 200 MG PO TABS: 400.0000 mg | ORAL_TABLET | Freq: Once | ORAL | Status: DC

## 2015-04-05 MED ORDER — AMIODARONE HCL 200 MG PO TABS
200.0000 mg | ORAL_TABLET | Freq: Two times a day (BID) | ORAL | Status: DC
  Administered 2015-04-07 – 2015-04-09 (×5): 200 mg via ORAL
  Filled 2015-04-05 (×5): qty 1

## 2015-04-05 MED ORDER — POLYETHYLENE GLYCOL 3350 17 G PO PACK: 17.0000 g | PACK | Freq: Every day | ORAL | Status: DC | PRN

## 2015-04-05 MED ORDER — AMIODARONE HCL 200 MG PO TABS: 200.0000 mg | ORAL_TABLET | Freq: Two times a day (BID) | ORAL | Status: DC

## 2015-04-05 MED ORDER — AMIODARONE HCL 200 MG PO TABS
200.0000 mg | ORAL_TABLET | Freq: Two times a day (BID) | ORAL | Status: DC
  Administered 2015-04-05: 200 mg via ORAL
  Filled 2015-04-05: qty 1

## 2015-04-05 MED ORDER — DEXTROSE 5 % IV SOLN
1.0000 g | Freq: Once | INTRAVENOUS | Status: AC
  Administered 2015-04-05: 1 g via INTRAVENOUS
  Filled 2015-04-05: qty 10

## 2015-04-05 MED ORDER — SODIUM CHLORIDE 0.9 % IV SOLN
INTRAVENOUS | Status: DC
  Administered 2015-04-05 – 2015-04-08 (×5): via INTRAVENOUS
  Administered 2015-04-08: 75 mL/h via INTRAVENOUS

## 2015-04-05 MED ORDER — DEXTROSE 5 % IV SOLN
2.0000 g | INTRAVENOUS | Status: DC
  Administered 2015-04-05 – 2015-04-09 (×5): 2 g via INTRAVENOUS
  Filled 2015-04-05 (×5): qty 2

## 2015-04-05 MED ORDER — TRAMADOL HCL 50 MG PO TABS
50.0000 mg | ORAL_TABLET | Freq: Four times a day (QID) | ORAL | Status: DC | PRN
  Administered 2015-04-06 – 2015-04-08 (×2): 50 mg via ORAL
  Filled 2015-04-05 (×3): qty 1

## 2015-04-05 MED ORDER — ONDANSETRON HCL 4 MG/2ML IJ SOLN
4.0000 mg | Freq: Four times a day (QID) | INTRAMUSCULAR | Status: DC | PRN
  Administered 2015-04-05 – 2015-04-08 (×3): 4 mg via INTRAVENOUS
  Filled 2015-04-05 (×3): qty 2

## 2015-04-05 MED ORDER — AMIODARONE HCL 200 MG PO TABS
400.0000 mg | ORAL_TABLET | Freq: Two times a day (BID) | ORAL | Status: AC
  Administered 2015-04-05 – 2015-04-06 (×3): 400 mg via ORAL
  Filled 2015-04-05 (×3): qty 2

## 2015-04-05 MED ORDER — SENNA 8.6 MG PO TABS
1.0000 | ORAL_TABLET | Freq: Two times a day (BID) | ORAL | Status: DC
Start: 2015-04-05 — End: 2015-04-09
  Administered 2015-04-05 – 2015-04-09 (×3): 8.6 mg via ORAL
  Filled 2015-04-05 (×6): qty 1

## 2015-04-05 MED ORDER — AMIODARONE HCL 200 MG PO TABS: 200.0000 mg | ORAL_TABLET | Freq: Once | ORAL | Status: DC

## 2015-04-05 NOTE — H&P (Signed)
error 

## 2015-04-05 NOTE — H&P (Signed)
PCP:   [name], MD   Chief Complaint:  Fever  HPI: 81 year old male who   has a past medical history of Blood in stool; Diverticulitis; GERD (gastroesophageal reflux disease); Glaucoma; Ulcer; History of transfusion of whole blood; Prostate disorder; Shortness of breath (10-18-11); Vertigo (10-18-11); Arthritis (10-18-11); H/O hiatal hernia (10-18-11); Adenomatous colon polyp; Urothelial carcinoma ([place]) (05/2014); PAF (paroxysmal atrial fibrillation) ([place]); Rheumatoid arthritis ([place]); and Discitis of thoracic region (07/04/2012). Today came to the ED with complaints of fever started 4 days ago. Patient also had nausea, dyspnea on exertion, poor by mouth intake. Patient has a history of bladder cancer and had bladder surgery done at [facility] 5 days ago. Patient was admitted to the  in November when he was diagnosed with right lower extremity DVT, an IVC filter was placed as patient was not a candidate for anticoagulation due to hematuria.  Patient denies chest pain, not requiring oxygen at rest. In the ED he was found to have UTI, with abnormal urine, leukocytosis.   Allergies:  No Known Allergies    Past Medical History  Diagnosis Date  . Blood in stool   . Diverticulitis   . GERD (gastroesophageal reflux disease)   . Glaucoma   . Ulcer   . History of transfusion of whole blood   . Prostate disorder   . Shortness of breath 10-18-11    with exertion, cardiac cath done 7-8 yrs ago negative.  . Vertigo 10-18-11    inner ear issues occ. -tx. Bonine as needed  . Arthritis 10-18-11    back, fingers  . H/O hiatal hernia 10-18-11    noted on recent CXR  . Adenomatous colon polyp   . Urothelial carcinoma ([place]) 05/2014  . PAF (paroxysmal atrial fibrillation) ([place])   . Rheumatoid arthritis ([place])   . Discitis of thoracic region 07/04/2012    Past Surgical History  Procedure Laterality Date  . Gastric ulcer  10-18-11    '91-blleding ulcer with blood transfusions after    . Appendectomy  10-18-11    age 49  . Cataract extraction, bilateral  10-18-11    bilateral   . Eye surgery  10-18-11    laser eye surgery s/p cataract bilaterally  . Cardiac catheterization  10-18-11    7-8 yrs ago-mild blockage,no tx. indicated  . Prostatectomy  10/27/2011    Procedure: PROSTATECTOMY SUPRAPUBIC;  Surgeon: Ailene Rud, MD;  Location: [facility];  Service: Urology;  Laterality: N/A;  Placement of suprapubic tube  . Cystoscopy  10/27/2011    Procedure: CYSTOSCOPY FLEXIBLE;  Surgeon: Ailene Rud, MD;  Location: [facility];  Service: Urology;  Laterality: N/A;  . Transurethral resection of bladder tumor with gyrus (turbt-gyrus)  06/2014    T1 HG tumor.  [facility].    Prior to Admission medications   Medication Sig Start Date End Date Taking? Authorizing Provider  acetaminophen (TYLENOL) 650 MG CR tablet Take 1,300 mg by mouth every 6 (six) hours as needed for pain.   Yes Historical Provider, MD  acetaminophen-codeine (TYLENOL #3) 300-30 MG tablet Take 1 tablet by mouth every 4 (four) hours as needed for moderate pain.   Yes Historical Provider, MD  amiodarone (PACERONE) 200 MG tablet Take 1 tablet (200 mg total) by mouth 2 (two) times daily. Patient taking differently: Take 200-400 mg by mouth 2 (two) times daily. 400 mg twice daily for 1 week (completing on 03/07/15) then resume 200 mg twice daily thereafter. 03/26/15  Yes Hosie Poisson,  MD  atorvastatin (LIPITOR) 20 MG tablet Take 10 mg by mouth daily. 03/29/15  Yes Historical Provider, MD  b complex vitamins tablet Take 1 tablet by mouth daily.   Yes Historical Provider, MD  Calcium Citrate-Vitamin D (CALCIUM + D PO) Take 1 tablet by mouth daily.   Yes Historical Provider, MD  diltiazem (CARDIZEM CD) 240 MG 24 hr capsule Take 1 capsule (240 mg total) by mouth daily. 03/19/15  Yes Hosie Poisson, MD  hyoscyamine (LEVSIN SL) 0.125 MG SL tablet Take 0.125 mg by mouth every 4 (four) hours as needed (bladder spasms).  02/01/15   Yes Historical Provider, MD  magnesium hydroxide (MILK OF MAGNESIA) 400 MG/5ML suspension Take 30 mLs by mouth daily as needed. For constipation.   Yes Historical Provider, MD  Multiple Vitamin (MULTIVITAMIN WITH MINERALS) TABS tablet Take 1 tablet by mouth daily. 05/15/14  Yes Estela Leonie Green, MD  ondansetron (ZOFRAN) 4 MG tablet Take 1 tablet (4 mg total) by mouth every 6 (six) hours as needed for nausea. 02/22/15  Yes Donne Hazel, MD  polyethylene glycol powder (GLYCOLAX/MIRALAX) powder Take 255 g by mouth daily. Patient taking differently: Take 17 g by mouth daily as needed (for constipation).  11/27/14  Yes Willia Craze, NP  predniSONE (DELTASONE) 5 MG tablet Take 5 mg by mouth daily with breakfast. .   Yes Historical Provider, MD  senna (SENOKOT) 8.6 MG tablet Take 1 tablet by mouth daily as needed for constipation.    Yes Historical Provider, MD  traMADol (ULTRAM) 50 MG tablet Take 50 mg by mouth every 6 (six) hours as needed for moderate pain.   Yes Historical Provider, MD  oxyCODONE-acetaminophen (PERCOCET/ROXICET) 5-325 MG tablet Take 1 tablet by mouth every 4 (four) hours as needed for moderate pain. Patient not taking: Reported on 04/04/2015 02/22/15   Donne Hazel, MD    Social History:  reports that he quit smoking about 36 years ago. His smoking use included Cigarettes. He has a 5 pack-year smoking history. His smokeless tobacco use includes Chew. He reports that he does not drink alcohol or use illicit drugs.  Family History  Problem Relation Age of Onset  . Breast cancer Sister   . Dementia Mother   . Heart attack Father   . Colon cancer Neg Hx     There were no vitals filed for this visit.  All the positives are listed in BOLD  Review of Systems:  HEENT: Headache, blurred vision, runny nose, sore throat Neck: Hypothyroidism, hyperthyroidism,,lymphadenopathy Chest : Shortness of breath, history of COPD, Asthma Heart : Chest pain, history of coronary  arterey disease GI:  Nausea, vomiting, diarrhea, constipation, GERD GU: Dysuria, urgency, frequency of urination, hematuria Neuro: Stroke, seizures, syncope Psych: Depression, anxiety, hallucinations   Physical Exam: Blood pressure 164/79, pulse 78, temperature 98.9 F (37.2 C), temperature source Oral, resp. rate 18, SpO2 96 %. Constitutional:   Patient is a well-developed and well-nourished male in no acute distress and cooperative with exam. Head: Normocephalic and atraumatic Mouth: Mucus membranes moist Eyes: PERRL, EOMI, conjunctivae normal Neck: Supple, No Thyromegaly Cardiovascular: RRR, S1 normal, S2 normal Pulmonary/Chest: CTAB, no wheezes, rales, or rhonchi Abdominal: Soft. Non-tender, non-distended, bowel sounds are normal, no masses, organomegaly, or guarding present.  Neurological: A&O x3, Strength is normal and symmetric bilaterally, cranial nerve II-XII are grossly intact, no focal motor deficit, sensory intact to light touch bilaterally.  Extremities : No Cyanosis, Clubbing or Edema  Labs on Admission:  Basic  Metabolic Panel:  Recent Labs Lab 03/29/15 0900 04/04/15 2257  NA 139 135  K 4.2 4.3  CL 104 102  CO2 27 23  GLUCOSE 139* 147*  BUN 21* 22*  CREATININE 1.98* 1.91*  CALCIUM 8.5* 8.4*   Liver Function Tests:  Recent Labs Lab 03/29/15 0900  AST 25  ALT 17  ALKPHOS 110  BILITOT 0.5  PROT 6.1*  ALBUMIN 2.8*   CBC:  Recent Labs Lab 04/04/15 2257  WBC 18.3*  HGB 11.7*  HCT 36.7*  MCV 92.4  PLT 258    BNP (last 3 results)  Recent Labs  02/15/15 1206 03/14/15 1220  BNP 522.9* 137.0*    Radiological Exams on Admission: Dg Chest 2 View  04/05/2015  CLINICAL DATA:  Postoperative radiograph, status post bladder surgery 6 days ago. Nausea, fever and shortness breath on exertion. Initial encounter. EXAM: CHEST  2 VIEW COMPARISON:  Chest radiograph performed 03/17/2015 FINDINGS: There is elevation of the right hemidiaphragm. Mild  bibasilar atelectasis is suspected. There is no evidence of pleural effusion or pneumothorax. The heart is borderline enlarged. No acute osseous abnormalities are seen. An IVC filter and nephrostomy catheter are partially imaged. IMPRESSION: Elevation of the right hemidiaphragm. Mild bibasilar atelectasis suspected. Borderline cardiomegaly. Electronically Signed   By: Garald Balding M.D.   On: 04/05/2015 00:21    EKG: Independently reviewed. Normal sinus rhythm   Assessment/Plan Active Problems:   HTN (hypertension)   Constipation   Urothelial carcinoma (HCC)   Acute renal failure (HCC)   Hematuria   UTI (lower urinary tract infection)  Sepsis due to UTI Patient's lactic acid was 2.54 on admission, after IV fluids limited take acid is 0.86. Started on IV Rocephin. Follow urine and blood culture results.  History of recent DVT Patient is status post IVC filter placement No anticoagulation due to ongoing hematuria. Will need to check with urology at [facility], regarding starting anticoagulation  History of bladder cancer/hematuria Status post resection of the tumor at [facility], has foley catheter in place Will follow urologist at [facility] as outpatient.  Atrial fibrillation Patient is currently in normal sinus rhythm, continue amiodarone, Cardizem. Not a candidate for anticoagulation as above  Constipation Constipation has not been improved with MiraLAX, will start Senokot  1 tablet twice a day  DVT prophylaxis SCDs  Code status: Full code  Family discussion: Admission, patients condition and plan of care including tests being ordered have been discussed with the patient and *his family at bedside including the son and daughter who indicate understanding and agree with the plan and Code Status.   Time Spent on Admission: 60 min  [facility] Pager: [phone] 04/05/2015, 3:24 AM  If 7PM-7AM, please contact night-coverage  [email or web address]  Password  [email or web address]

## 2015-04-05 NOTE — Progress Notes (Signed)
Hoyle Sauer on call and asked that we leave a PN with the following questions that will require and order: Per daughter Craig Waters was started recently and cardiologist ordered the Pacerone to be 400 mg BID thru 12/13 then reduce down to 200 mg BID (the admission orders have it 200mg  BID ).  Daughter also concerned about chronic constipation and wants more than Miralax ordered PRN please.

## 2015-04-05 NOTE — Progress Notes (Addendum)
[facility] PROGRESS NOTE  Craig Waters. [id number] DOB: March 10, 1931 DOA: 04/04/2015 PCP: [name], MD   SAME DAY NOTE: Patient admitted after midnight  HPI/Brief narrative 81 year old male whopresented to the ED with complaints of fever started 4 days prior to admit. Patient also had nausea, dyspnea on exertion, poor by mouth intake. Patient has a history of bladder cancer and had bladder surgery done at [facility] 5 days prior to admission.  Assessment/Plan: UTI with sepsis present on admission -Patient presented with UTI and leukocytosis with lactic acidosis. -Lactate resolved with IVF -Patient was started on IV Rocephin.  -Follow urine and blood culture results.  History of recent DVT -Patient is status post IVC filter placement -Patient is not anticoagulated secondary to ongoing hematuria.  History of bladder cancer/hematuria -Status post resection of the tumor at [facility], has foley catheter in place -Will follow urologist at [facility] as outpatient.  Atrial fibrillation -Patient is currently in normal sinus rhythm, continue amiodarone, Cardizem. -Not a candidate for anticoagulation as above  Constipation -Constipation has not been improved with MiraLAX -Senokot1 tablet twice a day was added  DVT prophylaxis -SCDs while inpatient  Code Status: Full Family Communication: Pt in room Disposition Plan: Plan d/c when transitioned to PO abx   Consultants:    Procedures:    Antibiotics: Anti-infectives    Start     Dose/Rate Route Frequency Ordered Stop   04/05/15 1000  cefTRIAXone (ROCEPHIN) 2 g in dextrose 5 % 50 mL IVPB     2 g 100 mL/hr over 30 Minutes Intravenous Every 24 hours 04/05/15 0422     04/05/15 0115  cefTRIAXone (ROCEPHIN) 1 g in dextrose 5 % 50 mL IVPB     1 g 100 mL/hr over 30 Minutes Intravenous  Once 04/05/15 0111 04/05/15 0209      HPI/Subjective: Feels better today  Objective: Filed Vitals:   04/05/15 0317  04/05/15 0339 04/05/15 0401 04/05/15 1351  BP: 164/79 158/81 159/81 130/63  Pulse: 78 79 83 82  Temp:  98.6 F (37 C) 99.1 F (37.3 C) 98.5 F (36.9 C)  TempSrc:  Oral Oral Oral  Resp: 18 19 19 18   Height:   5\' 11"  (1.803 m)   Weight:   93.305 kg (205 lb 11.2 oz)   SpO2: 96% 94% 98% 95%    Intake/Output Summary (Last 24 hours) at 04/05/15 1503 Last data filed at 04/05/15 1351  Gross per 24 hour  Intake 463.75 ml  Output   1000 ml  Net -536.25 ml   Filed Weights   04/05/15 0401  Weight: 93.305 kg (205 lb 11.2 oz)    Exam:   General:  Awake, in nad  Cardiovascular: regular, s1,s 2  Respiratory: normal resp effort, no wheezing  Abdomen: soft,nondistended  Musculoskeletal: perfused, no clubbing   Data Reviewed: Basic Metabolic Panel:  Recent Labs Lab 04/04/15 2257 04/05/15 0423  NA 135 135  K 4.3 3.9  CL 102 101  CO2 23 26  GLUCOSE 147* 111*  BUN 22* 21*  CREATININE 1.91* 1.84*  CALCIUM 8.4* 7.9*   Liver Function Tests:  Recent Labs Lab 04/05/15 0423  AST 15  ALT 14*  ALKPHOS 93  BILITOT 0.4  PROT 5.7*  ALBUMIN 2.6*   No results for input(s): LIPASE, AMYLASE in the last 168 hours. No results for input(s): AMMONIA in the last 168 hours. CBC:  Recent Labs Lab 04/04/15 2257 04/05/15 0423  WBC 18.3* 15.4*  HGB 11.7* 11.2*  HCT 36.7* 35.1*  MCV 92.4 92.1  PLT 258 227   Cardiac Enzymes: No results for input(s): CKTOTAL, CKMB, CKMBINDEX, TROPONINI in the last 168 hours. BNP (last 3 results)  Recent Labs  02/15/15 1206 03/14/15 1220  BNP 522.9* 137.0*    ProBNP (last 3 results) No results for input(s): PROBNP in the last 8760 hours.  CBG: No results for input(s): GLUCAP in the last 168 hours.  Recent Results (from the past 240 hour(s))  MRSA PCR Screening     Status: None   Collection Time: 04/05/15  4:43 AM  Result Value Ref Range Status   MRSA by PCR NEGATIVE NEGATIVE Final    Comment:        The GeneXpert MRSA Assay  (FDA approved for NASAL specimens only), is one component of a comprehensive MRSA colonization surveillance program. It is not intended to diagnose MRSA infection nor to guide or monitor treatment for MRSA infections.      Studies: Dg Chest 2 View  04/05/2015  CLINICAL DATA:  Postoperative radiograph, status post bladder surgery 6 days ago. Nausea, fever and shortness breath on exertion. Initial encounter. EXAM: CHEST  2 VIEW COMPARISON:  Chest radiograph performed 03/17/2015 FINDINGS: There is elevation of the right hemidiaphragm. Mild bibasilar atelectasis is suspected. There is no evidence of pleural effusion or pneumothorax. The heart is borderline enlarged. No acute osseous abnormalities are seen. An IVC filter and nephrostomy catheter are partially imaged. IMPRESSION: Elevation of the right hemidiaphragm. Mild bibasilar atelectasis suspected. Borderline cardiomegaly. Electronically Signed   By: Garald Balding M.D.   On: 04/05/2015 00:21    Scheduled Meds: . [START ON 04/07/2015] amiodarone  200 mg Oral BID  . amiodarone  400 mg Oral BID  . atorvastatin  10 mg Oral q1800  . cefTRIAXone (ROCEPHIN)  IV  2 g Intravenous Q24H  . diltiazem  240 mg Oral Daily  . predniSONE  5 mg Oral Q breakfast  . senna  1 tablet Oral BID   Continuous Infusions: . sodium chloride 75 mL/hr at 04/05/15 0453    Active Problems:   HTN (hypertension)   Constipation   Urothelial carcinoma ([place])   Acute renal failure (HCC)   Hematuria   UTI (lower urinary tract infection)   Craig Waters, [facility] Pager [phone]. If 7PM-7AM, please contact night-coverage at [email or web address], password [facility] 04/05/2015, 3:03 PM  LOS: 0 days

## 2015-04-05 NOTE — Progress Notes (Signed)
ANTIBIOTIC CONSULT NOTE - INITIAL  Pharmacy Consult for Ceftriaxone Indication: UTI  No Known Allergies  Patient Measurements: Height: 5\' 11"  (180.3 cm) Weight: 205 lb 11.2 oz (93.305 kg) IBW/kg (Calculated) : 75.3 Adjusted Body Weight:   Vital Signs: Temp: 99.1 F (37.3 C) (12/12 0401) Temp Source: Oral (12/12 0401) BP: 159/81 mmHg (12/12 0401) Pulse Rate: 83 (12/12 0401) Intake/Output from previous day:   Intake/Output from this shift:    Labs:  Recent Labs  04/04/15 2257  WBC 18.3*  HGB 11.7*  PLT 258  CREATININE 1.91*   Estimated Creatinine Clearance: 34.8 mL/min (by C-G formula based on Cr of 1.91). No results for input(s): VANCOTROUGH, VANCOPEAK, VANCORANDOM, GENTTROUGH, GENTPEAK, GENTRANDOM, TOBRATROUGH, TOBRAPEAK, TOBRARND, AMIKACINPEAK, AMIKACINTROU, AMIKACIN in the last 72 hours.   Microbiology: Recent Results (from the past 720 hour(s))  Urine culture     Status: None   Collection Time: 03/15/15  5:33 AM  Result Value Ref Range Status   Specimen Description URINE, CATHETERIZED  Final   Special Requests NONE  Final   Culture   Final    MULTIPLE SPECIES PRESENT, SUGGEST RECOLLECTION Performed at [facility]    Report Status 03/16/2015 FINAL  Final    Medical History: Past Medical History  Diagnosis Date  . Blood in stool   . Diverticulitis   . GERD (gastroesophageal reflux disease)   . Glaucoma   . Ulcer   . History of transfusion of whole blood   . Prostate disorder   . Shortness of breath 10-18-11    with exertion, cardiac cath done 7-8 yrs ago negative.  . Vertigo 10-18-11    inner ear issues occ. -tx. Bonine as needed  . Arthritis 10-18-11    back, fingers  . H/O hiatal hernia 10-18-11    noted on recent CXR  . Adenomatous colon polyp   . Urothelial carcinoma ([place]) 05/2014  . PAF (paroxysmal atrial fibrillation) ([place])   . Rheumatoid arthritis ([place])   . Discitis of thoracic region 07/04/2012    Medications:  Anti-infectives     Start     Dose/Rate Route Frequency Ordered Stop   04/05/15 1000  cefTRIAXone (ROCEPHIN) 2 g in dextrose 5 % 50 mL IVPB     2 g 100 mL/hr over 30 Minutes Intravenous Every 24 hours 04/05/15 0422     04/05/15 0115  cefTRIAXone (ROCEPHIN) 1 g in dextrose 5 % 50 mL IVPB     1 g 100 mL/hr over 30 Minutes Intravenous  Once 04/05/15 0111 04/05/15 0209     Assessment: Patient with hx of bladder cancer, hematuria and recent bladder surgery (5 days prior) found to have UTI.  Will consider this a complicated UTI.  First dose of antibiotics already given.  Goal of Therapy:  Rocephin based on manufacturer dosing recommendations.   Plan: Ceftriaxone 2gm iv q24hr Follow up culture results  Nani Skillern Crowford 04/05/2015,4:22 AM

## 2015-04-05 NOTE — ED Notes (Signed)
Bed: [id number] Expected date:  Expected time:  Means of arrival:  Comments: Room 11

## 2015-04-06 LAB — BASIC METABOLIC PANEL
ANION GAP: 4 — AB (ref 5–15)
BUN: 20 mg/dL (ref 6–20)
CHLORIDE: 108 mmol/L (ref 101–111)
CO2: 26 mmol/L (ref 22–32)
Calcium: 8.2 mg/dL — ABNORMAL LOW (ref 8.9–10.3)
Creatinine, Ser: 1.89 mg/dL — ABNORMAL HIGH (ref 0.61–1.24)
GFR, EST AFRICAN AMERICAN: 36 mL/min — AB (ref >60)
GFR, EST NON AFRICAN AMERICAN: 31 mL/min — AB (ref >60)
Glucose, Bld: 89 mg/dL (ref 65–99)
POTASSIUM: 4.3 mmol/L (ref 3.5–5.1)
SODIUM: 138 mmol/L (ref 135–145)

## 2015-04-06 LAB — CBC
HCT: 32.4 % — ABNORMAL LOW (ref 39.0–52.0)
Hemoglobin: 10.2 g/dL — ABNORMAL LOW (ref 13.0–17.0)
MCH: 29 pg (ref 26.0–34.0)
MCHC: 31.5 g/dL (ref 30.0–36.0)
MCV: 92 fL (ref 78.0–100.0)
Platelets: 204 10*3/uL (ref 150–400)
RBC: 3.52 MIL/uL — ABNORMAL LOW (ref 4.22–5.81)
RDW: 14.4 % (ref 11.5–15.5)
WBC: 15.5 10*3/uL — ABNORMAL HIGH (ref 4.0–10.5)

## 2015-04-06 LAB — URINE CULTURE
CULTURE: NO GROWTH
Special Requests: NORMAL

## 2015-04-06 NOTE — Progress Notes (Signed)
[facility]  Patient Status: Active (receiving services up to time of hospitalization)  [facility] is providing the following services: RN and PT  If patient discharges after hours, please call [phone].   Craig Waters 04/06/2015, 9:29 AM

## 2015-04-06 NOTE — Progress Notes (Signed)
[facility] PROGRESS NOTE  Craig Waters. [id number] DOB: April 12, 1931 DOA: 04/04/2015 PCP: [name], MD   HPI/Brief narrative 81 year old male whopresented to the ED with complaints of fever started 4 days prior to admit. Patient also had nausea, dyspnea on exertion, poor by mouth intake. Patient has a history of bladder cancer and had bladder surgery done at [facility] 5 days prior to admission.  Assessment/Plan: UTI with sepsis present on admission -Patient presented with UTI and leukocytosis with lactic acidosis. -Lactate resolved with IVF -Patient was started on empiric IV Rocephin.  -Follow urine and blood culture results, thus far no growth  History of recent DVT -Patient is status post IVC filter placement -Patient is not anticoagulated secondary to ongoing hematuria.  History of bladder cancer/hematuria -Status post resection of the tumor at [facility], has foley catheter in place -Patient to follow urologist at [facility] as outpatient.  Atrial fibrillation -Patient is currently in normal sinus rhythm, continue amiodarone, Cardizem. -Not a candidate for anticoagulation as above  Constipation -Patient is continued on cathartics PRN -Patient reports large BM this AM with resultant improvement in abd fullness  DVT prophylaxis -SCDs while inpatient  Code Status: Full Family Communication: Pt in room Disposition Plan: Plan d/c when transitioned to PO abx   Consultants:    Procedures:    Antibiotics: Anti-infectives    Start     Dose/Rate Route Frequency Ordered Stop   04/05/15 1000  cefTRIAXone (ROCEPHIN) 2 g in dextrose 5 % 50 mL IVPB     2 g 100 mL/hr over 30 Minutes Intravenous Every 24 hours 04/05/15 0422     04/05/15 0115  cefTRIAXone (ROCEPHIN) 1 g in dextrose 5 % 50 mL IVPB     1 g 100 mL/hr over 30 Minutes Intravenous  Once 04/05/15 0111 04/05/15 0209      HPI/Subjective: No complaints. Denies sob  Objective: Filed Vitals:    04/05/15 0401 04/05/15 1351 04/05/15 2143 04/06/15 0443  BP: 159/81 130/63 155/73 152/56  Pulse: 83 82 88 85  Temp: 99.1 F (37.3 C) 98.5 F (36.9 C) 97.9 F (36.6 C) 98.3 F (36.8 C)  TempSrc: Oral Oral Oral Oral  Resp: 19 18 20 18   Height: 5\' 11"  (1.803 m)     Weight: 93.305 kg (205 lb 11.2 oz)     SpO2: 98% 95% 96% 98%    Intake/Output Summary (Last 24 hours) at 04/06/15 1409 Last data filed at 04/06/15 1024  Gross per 24 hour  Intake   2355 ml  Output    950 ml  Net   1405 ml   Filed Weights   04/05/15 0401  Weight: 93.305 kg (205 lb 11.2 oz)    Exam:   General:  Awake, laying in bed, in nad  Cardiovascular: regular, s1,s 2  Respiratory: normal resp effort, no wheezing  Abdomen: soft,nondistended  Musculoskeletal: perfused, no clubbing, no cyanosis  Data Reviewed: Basic Metabolic Panel:  Recent Labs Lab 04/04/15 2257 04/05/15 0423 04/06/15 0428  NA 135 135 138  K 4.3 3.9 4.3  CL 102 101 108  CO2 23 26 26   GLUCOSE 147* 111* 89  BUN 22* 21* 20  CREATININE 1.91* 1.84* 1.89*  CALCIUM 8.4* 7.9* 8.2*   Liver Function Tests:  Recent Labs Lab 04/05/15 0423  AST 15  ALT 14*  ALKPHOS 93  BILITOT 0.4  PROT 5.7*  ALBUMIN 2.6*   No results for input(s): LIPASE, AMYLASE in the last 168 hours. No results for input(s): AMMONIA in  the last 168 hours. CBC:  Recent Labs Lab 04/04/15 2257 04/05/15 0423 04/06/15 0428  WBC 18.3* 15.4* 15.5*  HGB 11.7* 11.2* 10.2*  HCT 36.7* 35.1* 32.4*  MCV 92.4 92.1 92.0  PLT 258 227 204   Cardiac Enzymes: No results for input(s): CKTOTAL, CKMB, CKMBINDEX, TROPONINI in the last 168 hours. BNP (last 3 results)  Recent Labs  02/15/15 1206 03/14/15 1220  BNP 522.9* 137.0*    ProBNP (last 3 results) No results for input(s): PROBNP in the last 8760 hours.  CBG: No results for input(s): GLUCAP in the last 168 hours.  Recent Results (from the past 240 hour(s))  Blood culture (routine x 2)     Status: None  (Preliminary result)   Collection Time: 04/04/15 10:50 PM  Result Value Ref Range Status   Specimen Description BLOOD RIGHT HAND  Final   Special Requests BOTTLES DRAWN AEROBIC AND ANAEROBIC 5CC  Final   Culture   Final    NO GROWTH 1 DAY Performed at [facility]    Report Status PENDING  Incomplete  Urine C&S     Status: None   Collection Time: 04/04/15 11:00 PM  Result Value Ref Range Status   Specimen Description URINE, CATHETERIZED  Final   Special Requests Normal  Final   Culture   Final    NO GROWTH 1 DAY Performed at [facility]    Report Status 04/06/2015 FINAL  Final  Blood culture (routine x 2)     Status: None (Preliminary result)   Collection Time: 04/04/15 11:47 PM  Result Value Ref Range Status   Specimen Description BLOOD LEFT ANTECUBITAL  Final   Special Requests BOTTLES DRAWN AEROBIC AND ANAEROBIC 5CC  Final   Culture   Final    NO GROWTH 1 DAY Performed at [facility]    Report Status PENDING  Incomplete  MRSA PCR Screening     Status: None   Collection Time: 04/05/15  4:43 AM  Result Value Ref Range Status   MRSA by PCR NEGATIVE NEGATIVE Final    Comment:        The GeneXpert MRSA Assay (FDA approved for NASAL specimens only), is one component of a comprehensive MRSA colonization surveillance program. It is not intended to diagnose MRSA infection nor to guide or monitor treatment for MRSA infections.      Studies: Dg Chest 2 View  04/05/2015  CLINICAL DATA:  Postoperative radiograph, status post bladder surgery 6 days ago. Nausea, fever and shortness breath on exertion. Initial encounter. EXAM: CHEST  2 VIEW COMPARISON:  Chest radiograph performed 03/17/2015 FINDINGS: There is elevation of the right hemidiaphragm. Mild bibasilar atelectasis is suspected. There is no evidence of pleural effusion or pneumothorax. The heart is borderline enlarged. No acute osseous abnormalities are seen. An IVC filter and nephrostomy catheter  are partially imaged. IMPRESSION: Elevation of the right hemidiaphragm. Mild bibasilar atelectasis suspected. Borderline cardiomegaly. Electronically Signed   By: Garald Balding M.D.   On: 04/05/2015 00:21   Dg Abd Portable 1v  04/05/2015  CLINICAL DATA:  81 year old male with constipation and nausea. EXAM: PORTABLE ABDOMEN - 1 VIEW COMPARISON:  Abdominal radiograph 02/20/2015. FINDINGS: Numerous nondilated loops of gas-filled small bowel are noted throughout the central abdomen. Gas and stool is also noted throughout the colon and distal rectum. In the right lower quadrant there is a single dilated loop of small bowel measuring up to 6.5 cm in diameter just medial to the cecum. No gross  evidence of pneumoperitoneum. Left-sided double-J ureteral stent in position with proximal loop reformed over the left upper quadrant, likely in the left renal pelvis, and distal loop reformed over the central anatomic pelvis, likely in the urinary bladder. IVC filter in position with tip terminating at the low superior endplate of L3. Unknown metallic density projecting over the central anatomic pelvis. IMPRESSION: 1. Nonspecific bowel gas pattern, as above. The possibility of early or partial small bowel obstruction should be considered. 2. No pneumoperitoneum. 3. Postoperative changes and support apparatus, as above. Electronically Signed   By: Vinnie Langton M.D.   On: 04/05/2015 17:57    Scheduled Meds: . [START ON 04/07/2015] amiodarone  200 mg Oral BID  . amiodarone  400 mg Oral BID  . atorvastatin  10 mg Oral q1800  . cefTRIAXone (ROCEPHIN)  IV  2 g Intravenous Q24H  . diltiazem  240 mg Oral Daily  . predniSONE  5 mg Oral Q breakfast  . senna  1 tablet Oral BID   Continuous Infusions: . sodium chloride 75 mL/hr at 04/06/15 0556    Active Problems:   HTN (hypertension)   Constipation   Urothelial carcinoma ([place])   Acute renal failure (HCC)   Hematuria   UTI (lower urinary tract  infection)   [facility], [place] [facility] Pager [phone]. If 7PM-7AM, please contact night-coverage at [email or web address], password [facility] 04/06/2015, 2:09 PM  LOS: 1 day

## 2015-04-07 DIAGNOSIS — N179 Acute kidney failure, unspecified: Secondary | ICD-10-CM

## 2015-04-07 DIAGNOSIS — C689 Malignant neoplasm of urinary organ, unspecified: Secondary | ICD-10-CM

## 2015-04-07 DIAGNOSIS — R319 Hematuria, unspecified: Secondary | ICD-10-CM

## 2015-04-07 LAB — CBC
HEMATOCRIT: 31.6 % — AB (ref 39.0–52.0)
Hemoglobin: 10.2 g/dL — ABNORMAL LOW (ref 13.0–17.0)
MCH: 29.4 pg (ref 26.0–34.0)
MCHC: 32.3 g/dL (ref 30.0–36.0)
MCV: 91.1 fL (ref 78.0–100.0)
Platelets: 177 10*3/uL (ref 150–400)
RBC: 3.47 MIL/uL — ABNORMAL LOW (ref 4.22–5.81)
RDW: 14.1 % (ref 11.5–15.5)
WBC: 12 10*3/uL — ABNORMAL HIGH (ref 4.0–10.5)

## 2015-04-07 LAB — BASIC METABOLIC PANEL
Anion gap: 6 (ref 5–15)
BUN: 18 mg/dL (ref 6–20)
CALCIUM: 8 mg/dL — AB (ref 8.9–10.3)
CO2: 24 mmol/L (ref 22–32)
Chloride: 106 mmol/L (ref 101–111)
Creatinine, Ser: 1.67 mg/dL — ABNORMAL HIGH (ref 0.61–1.24)
GFR calc Af Amer: 42 mL/min — ABNORMAL LOW (ref >60)
GFR, EST NON AFRICAN AMERICAN: 37 mL/min — AB (ref >60)
GLUCOSE: 89 mg/dL (ref 65–99)
Potassium: 4 mmol/L (ref 3.5–5.1)
Sodium: 136 mmol/L (ref 135–145)

## 2015-04-07 NOTE — Progress Notes (Addendum)
[facility] PROGRESS NOTE  Craig Waters. [id number] DOB: 1930/05/04 DOA: 04/04/2015 PCP: [name], MD   HPI/Brief narrative 81 year old male whopresented to the ED with complaints of fever started 4 days prior to admit. Patient also had nausea, dyspnea on exertion, poor by mouth intake. Patient has a history of bladder cancer and had bladder surgery done at [facility] 5 days prior to admission.  Assessment/Plan: UTI with sepsis present on admission -Patient presented with UTI and leukocytosis with lactic acidosis. -improving, continue empiric IV Rocephin, IVF -Follow urine and blood culture results, thus far no growth  History of recent DVT -Patient is status post IVC filter placement -Patient is not anticoagulated secondary to ongoing hematuria.  History of bladder cancer/hematuria -Status post resection of the tumor at [facility], has foley catheter in place -Patient to follow urologist at [facility] as outpatient. -i called and left msg for Urologist Dr.Rukstalis  Atrial fibrillation -Patient is currently in normal sinus rhythm, continue amiodarone, Cardizem. -Not a candidate for anticoagulation as above  Constipation -BM yesterday, laxatives PRN  DVT prophylaxis -SCDs   Code Status: Full Family Communication:none at bedside, called and d/w daughter Disposition Plan: ? Home tomorrow  Consultants:    Procedures:    Antibiotics: Anti-infectives    Start     Dose/Rate Route Frequency Ordered Stop   04/05/15 1000  cefTRIAXone (ROCEPHIN) 2 g in dextrose 5 % 50 mL IVPB     2 g 100 mL/hr over 30 Minutes Intravenous Every 24 hours 04/05/15 0422     04/05/15 0115  cefTRIAXone (ROCEPHIN) 1 g in dextrose 5 % 50 mL IVPB     1 g 100 mL/hr over 30 Minutes Intravenous  Once 04/05/15 0111 04/05/15 0209      HPI/Subjective: No complaints. Denies sob  Objective: Filed Vitals:   04/06/15 0443 04/06/15 1849 04/06/15 2039 04/07/15 0456  BP: 152/56 148/68  132/72 149/71  Pulse: 85 88 84 76  Temp: 98.3 F (36.8 C) 98.3 F (36.8 C) 98.3 F (36.8 C) 98.6 F (37 C)  TempSrc: Oral Oral Oral Oral  Resp: 18 20 20 20   Height:      Weight:      SpO2: 98% 98% 97% 96%    Intake/Output Summary (Last 24 hours) at 04/07/15 1316 Last data filed at 04/07/15 0700  Gross per 24 hour  Intake   2090 ml  Output   1650 ml  Net    440 ml   Filed Weights   04/05/15 0401  Weight: 93.305 kg (205 lb 11.2 oz)    Exam:   General:  Awake, laying in bed, in nad  Cardiovascular: regular, s1,s 2  Respiratory: normal resp effort, no wheezing  Abdomen: soft,nondistended  Musculoskeletal: perfused, no clubbing, no cyanosis  Data Reviewed: Basic Metabolic Panel:  Recent Labs Lab 04/04/15 2257 04/05/15 0423 04/06/15 0428 04/07/15 0432  NA 135 135 138 136  K 4.3 3.9 4.3 4.0  CL 102 101 108 106  CO2 23 26 26 24   GLUCOSE 147* 111* 89 89  BUN 22* 21* 20 18  CREATININE 1.91* 1.84* 1.89* 1.67*  CALCIUM 8.4* 7.9* 8.2* 8.0*   Liver Function Tests:  Recent Labs Lab 04/05/15 0423  AST 15  ALT 14*  ALKPHOS 93  BILITOT 0.4  PROT 5.7*  ALBUMIN 2.6*   No results for input(s): LIPASE, AMYLASE in the last 168 hours. No results for input(s): AMMONIA in the last 168 hours. CBC:  Recent Labs Lab 04/04/15 2257 04/05/15 0423  04/06/15 0428 04/07/15 0432  WBC 18.3* 15.4* 15.5* 12.0*  HGB 11.7* 11.2* 10.2* 10.2*  HCT 36.7* 35.1* 32.4* 31.6*  MCV 92.4 92.1 92.0 91.1  PLT 258 227 204 177   Cardiac Enzymes: No results for input(s): CKTOTAL, CKMB, CKMBINDEX, TROPONINI in the last 168 hours. BNP (last 3 results)  Recent Labs  02/15/15 1206 03/14/15 1220  BNP 522.9* 137.0*    ProBNP (last 3 results) No results for input(s): PROBNP in the last 8760 hours.  CBG: No results for input(s): GLUCAP in the last 168 hours.  Recent Results (from the past 240 hour(s))  Blood culture (routine x 2)     Status: None (Preliminary result)    Collection Time: 04/04/15 10:50 PM  Result Value Ref Range Status   Specimen Description BLOOD RIGHT HAND  Final   Special Requests BOTTLES DRAWN AEROBIC AND ANAEROBIC 5CC  Final   Culture   Final    NO GROWTH 2 DAYS Performed at [facility]    Report Status PENDING  Incomplete  Urine C&S     Status: None   Collection Time: 04/04/15 11:00 PM  Result Value Ref Range Status   Specimen Description URINE, CATHETERIZED  Final   Special Requests Normal  Final   Culture   Final    NO GROWTH 1 DAY Performed at [facility]    Report Status 04/06/2015 FINAL  Final  Blood culture (routine x 2)     Status: None (Preliminary result)   Collection Time: 04/04/15 11:47 PM  Result Value Ref Range Status   Specimen Description BLOOD LEFT ANTECUBITAL  Final   Special Requests BOTTLES DRAWN AEROBIC AND ANAEROBIC 5CC  Final   Culture   Final    NO GROWTH 2 DAYS Performed at [facility]    Report Status PENDING  Incomplete  MRSA PCR Screening     Status: None   Collection Time: 04/05/15  4:43 AM  Result Value Ref Range Status   MRSA by PCR NEGATIVE NEGATIVE Final    Comment:        The GeneXpert MRSA Assay (FDA approved for NASAL specimens only), is one component of a comprehensive MRSA colonization surveillance program. It is not intended to diagnose MRSA infection nor to guide or monitor treatment for MRSA infections.      Studies: Dg Abd Portable 1v  04/05/2015  CLINICAL DATA:  81 year old male with constipation and nausea. EXAM: PORTABLE ABDOMEN - 1 VIEW COMPARISON:  Abdominal radiograph 02/20/2015. FINDINGS: Numerous nondilated loops of gas-filled small bowel are noted throughout the central abdomen. Gas and stool is also noted throughout the colon and distal rectum. In the right lower quadrant there is a single dilated loop of small bowel measuring up to 6.5 cm in diameter just medial to the cecum. No gross evidence of pneumoperitoneum. Left-sided double-J  ureteral stent in position with proximal loop reformed over the left upper quadrant, likely in the left renal pelvis, and distal loop reformed over the central anatomic pelvis, likely in the urinary bladder. IVC filter in position with tip terminating at the low superior endplate of L3. Unknown metallic density projecting over the central anatomic pelvis. IMPRESSION: 1. Nonspecific bowel gas pattern, as above. The possibility of early or partial small bowel obstruction should be considered. 2. No pneumoperitoneum. 3. Postoperative changes and support apparatus, as above. Electronically Signed   By: Vinnie Langton M.D.   On: 04/05/2015 17:57    Scheduled Meds: . amiodarone  200  mg Oral BID  . atorvastatin  10 mg Oral q1800  . cefTRIAXone (ROCEPHIN)  IV  2 g Intravenous Q24H  . diltiazem  240 mg Oral Daily  . predniSONE  5 mg Oral Q breakfast  . senna  1 tablet Oral BID   Continuous Infusions: . sodium chloride 75 mL/hr at 04/07/15 0458    Active Problems:   HTN (hypertension)   Constipation   Urothelial carcinoma (HCC)   Acute renal failure ([place])   Hematuria   UTI (lower urinary tract infection)   [facility]  [facility] Pager [phone]. If 7PM-7AM, please contact night-coverage at [email or web address], password [facility] 04/07/2015, 1:16 PM  LOS: 2 days

## 2015-04-07 NOTE — Evaluation (Signed)
Physical Therapy Evaluation Patient Details Name: Craig Waters. MRN: [id number] DOB: 01/23/1931 Today's Date: 04/07/2015   History of Present Illness  Craig Waters. is a 81 y.o. male a PMH of rheumatoid arthritis, high-grade urothelial cell carcinoma, DVT, s/p IVC filter and admitted for UTI with sepsis. History of bladder cancer and had bladder surgery at [facility] a few days prior to admission.  Clinical Impression  Pt admitted with above diagnosis. Pt currently with functional limitations due to the deficits listed below (see PT Problem List).  Pt will benefit from skilled PT to increase their independence and safety with mobility to allow discharge to the venue listed below.  Pt assisted with ambulating short distance in hallway, limited by fatigue.  Pt with recent admission and HHPT recommended at that time.  Continue to recommend f/u with HHPT.     Follow Up Recommendations Home health PT;Supervision/Assistance - 24 hour    Equipment Recommendations  None recommended by PT    Recommendations for Other Services       Precautions / Restrictions Precautions Precautions: Fall      Mobility  Bed Mobility Overal bed mobility: Needs Assistance Bed Mobility: Sit to Supine           General bed mobility comments: supervision for lines  Transfers Overall transfer level: Needs assistance Equipment used: Rolling walker (2 wheeled) Transfers: Sit to/from Stand Sit to Stand: Min guard         General transfer comment: verbal cues for safe technique, wide BOS observed  Ambulation/Gait Ambulation/Gait assistance: Min guard Ambulation Distance (Feet): 100 Feet Assistive device: Rolling walker (2 wheeled) Gait Pattern/deviations: Step-through pattern;Trunk flexed     General Gait Details: verbal cues for RW positioning, pt limited distance due to fatigue and reports moderate SOB upon return to room (improved with rest and return to bed)  Stairs             Wheelchair Mobility    Modified Rankin (Stroke Patients Only)       Balance                                             Pertinent Vitals/Pain Pain Assessment: No/denies pain    Home Living Family/patient expects to be discharged to:: Private residence Living Arrangements: Children Available Help at Discharge: Family Type of Home: House Home Access: Level entry     Home Layout: One level Home Equipment: Environmental consultant - 2 wheels;Cane - single point;Bedside commode;Shower seat;Hospital bed      Prior Function Level of Independence: Independent         Comments: has been having  HHPT     Hand Dominance        Extremity/Trunk Assessment               Lower Extremity Assessment: Generalized weakness         Communication   Communication: No difficulties  Cognition Arousal/Alertness: Awake/alert Behavior During Therapy: WFL for tasks assessed/performed Overall Cognitive Status: Within Functional Limits for tasks assessed                      General Comments      Exercises        Assessment/Plan    PT Assessment Patient needs continued PT services  PT Diagnosis Difficulty walking;Generalized weakness   PT Problem List Decreased strength;Decreased  activity tolerance;Decreased balance;Decreased mobility;Decreased knowledge of use of DME;Decreased safety awareness  PT Treatment Interventions DME instruction;Gait training;Functional mobility training;Therapeutic activities;Therapeutic exercise;Patient/family education   PT Goals (Current goals can be found in the Care Plan section) Acute Rehab PT Goals PT Goal Formulation: With patient Time For Goal Achievement: 04/14/15 Potential to Achieve Goals: Good    Frequency Min 3X/week   Barriers to discharge        Co-evaluation               End of Session Equipment Utilized During Treatment: Gait belt Activity Tolerance: Patient limited by fatigue Patient left:  with call bell/phone within reach;in bed;with bed alarm set Nurse Communication: Mobility status         Time: 1310-1320 PT Time Calculation (min) (ACUTE ONLY): 10 min   Charges:   PT Evaluation $Initial PT Evaluation Tier I: 1 Procedure     PT G Codes:        Kazi Reppond,KATHrine E 04/07/2015, 2:53 PM Carmelia Bake, PT, DPT 04/07/2015 Pager: [phone]

## 2015-04-08 DIAGNOSIS — N390 Urinary tract infection, site not specified: Secondary | ICD-10-CM

## 2015-04-08 LAB — CBC
HCT: 34.1 % — ABNORMAL LOW (ref 39.0–52.0)
HEMOGLOBIN: 10.9 g/dL — AB (ref 13.0–17.0)
MCH: 29.4 pg (ref 26.0–34.0)
MCHC: 32 g/dL (ref 30.0–36.0)
MCV: 91.9 fL (ref 78.0–100.0)
PLATELETS: 198 10*3/uL (ref 150–400)
RBC: 3.71 MIL/uL — ABNORMAL LOW (ref 4.22–5.81)
RDW: 14.4 % (ref 11.5–15.5)
WBC: 12.7 10*3/uL — ABNORMAL HIGH (ref 4.0–10.5)

## 2015-04-08 LAB — BASIC METABOLIC PANEL
ANION GAP: 8 (ref 5–15)
BUN: 14 mg/dL (ref 6–20)
CO2: 25 mmol/L (ref 22–32)
Calcium: 8 mg/dL — ABNORMAL LOW (ref 8.9–10.3)
Chloride: 108 mmol/L (ref 101–111)
Creatinine, Ser: 1.66 mg/dL — ABNORMAL HIGH (ref 0.61–1.24)
GFR calc Af Amer: 43 mL/min — ABNORMAL LOW (ref >60)
GFR, EST NON AFRICAN AMERICAN: 37 mL/min — AB (ref >60)
GLUCOSE: 87 mg/dL (ref 65–99)
POTASSIUM: 3.6 mmol/L (ref 3.5–5.1)
Sodium: 141 mmol/L (ref 135–145)

## 2015-04-08 NOTE — Progress Notes (Signed)
Pharmacy Antibiotic Follow-up Note  Craig Du. is a 81 y.o. year-old male admitted on 04/04/2015.  The patient is currently on day #4 of ceftriaxone for UTI following urologic procedure at [facility].  Assessment/Plan:  Ceftriaxone 2gm IV [id number] for complicated UTI  No adjustment needed for renal function so pharmacy will sign off   Consider change to ceftin 500mg  PO BID to complete 10-days of therapy  Temp (24hrs), Avg:98.5 F (36.9 C), Min:98.2 F (36.8 C), Max:98.7 F (37.1 C)   Recent Labs Lab 04/04/15 2257 04/05/15 0423 04/06/15 0428 04/07/15 0432 04/08/15 0433  WBC 18.3* 15.4* 15.5* 12.0* 12.7*    Recent Labs Lab 04/04/15 2257 04/05/15 0423 04/06/15 0428 04/07/15 0432 04/08/15 0433  CREATININE 1.91* 1.84* 1.89* 1.67* 1.66*   Estimated Creatinine Clearance: 40 mL/min (by C-G formula based on Cr of 1.66).    No Known Allergies  Antimicrobials this admission: 12/12 >> ceftriaxone  Levels/dose changes this admission:  Microbiology results: 12/11 urine: NG  12/11 blood: NG  12/12 MRSA PCR: neg  Thank you for allowing pharmacy to be a part of this patient's care.  Doreene Eland, PharmD, BCPS.   Pager: [phone] 04/08/2015 9:43 AM

## 2015-04-08 NOTE — Evaluation (Signed)
Occupational Therapy Evaluation Patient Details Name: Craig Waters. MRN: [id number] DOB: [birth year] Today's Date: 04/08/2015    History of Present Illness Craig Waters. is a 81 y.o. male a PMH of rheumatoid arthritis, high-grade urothelial cell carcinoma, DVT, s/p IVC filter and admitted for UTI with sepsis. History of bladder cancer and had bladder surgery at [facility] a few days prior to admission.   Clinical Impression   Pt up from bed to sit up in recliner. He washed his face and stated he felt weaker and "not as good as yesterday." He requested to finish his bath a little later when he hopefully felt better. Will follow on acute to progress ADL independence.     Follow Up Recommendations  Home health OT;Supervision/Assistance - 24 hour    Equipment Recommendations  None recommended by OT    Recommendations for Other Services       Precautions / Restrictions Precautions Precautions: Fall Restrictions Weight Bearing Restrictions: No      Mobility Bed Mobility Overal bed mobility: Needs Assistance Bed Mobility: Supine to Sit       Sit to supine: Supervision      Transfers Overall transfer level: Needs assistance Equipment used: Rolling walker (2 wheeled) Transfers: Sit to/from Stand Sit to Stand: Min guard         General transfer comment: cues for safety and technique.     Balance     Sitting balance-Leahy Scale: Fair       Standing balance-Leahy Scale: Fair                              ADL Overall ADL's : Needs assistance/impaired     Grooming: Wash/dry hands;Set up;Wash/dry face;Sitting   Upper Body Bathing: Set up;Supervision/ safety;Sitting   Lower Body Bathing: Sit to/from stand;Min guard   Upper Body Dressing : Set up;Sitting;Supervision/safety   Lower Body Dressing: Min guard;Sit to/from stand   Toilet Transfer: Min guard;Stand-pivot;RW   Toileting- Water quality scientist and Hygiene: Min guard;Sit to/from  stand         General ADL Comments: Pt states he doesnt feel as good as he did yesterday. He states he feels more sleepy and weak overall today. Nursing made aware. Pt states his daughter lives with him and can continue to assist at d/c. He is motivated and wanted to get up to chair and washed his face but stated he didnt feel up to finishing his bath. He states he would like to do his bath later today and nursing made aware. Pt able to don bilateral sock at EOB with some effort.      Vision     Perception     Praxis      Pertinent Vitals/Pain Pain Assessment: Faces Faces Pain Scale: Hurts a little bit Pain Location: catheter pulling.  Pain Descriptors / Indicators: Discomfort Pain Intervention(s): Repositioned     Hand Dominance     Extremity/Trunk Assessment Upper Extremity Assessment Upper Extremity Assessment: Generalized weakness           Communication Communication Communication: No difficulties   Cognition Arousal/Alertness: Awake/alert Behavior During Therapy: WFL for tasks assessed/performed Overall Cognitive Status: Within Functional Limits for tasks assessed       Memory:  (did have some difficulty recalling time frame of recent hopsitalizations and surgery.)             General Comments       Exercises  Shoulder Instructions      Home Living Family/patient expects to be discharged to:: Private residence Living Arrangements: Children Available Help at Discharge: Family Type of Home: House Home Access: Level entry     [place]: One level     Bathroom Shower/Tub: Teacher, early years/pre: [place]: Environmental consultant - 2 wheels;Cane - single point;Bedside commode;Shower seat;Hospital bed          Prior Functioning/Environment Level of Independence: Independent;Needs assistance    ADL's / Homemaking Assistance Needed: daughter assists with meals and laundry. Pt could sponge bathe and dress on his own. Pt  has been sponge bathing since recent hospitalizations.    Comments: has been having  HHPT    OT Diagnosis: Generalized weakness   OT Problem List: Decreased knowledge of use of DME or AE;Decreased strength   OT Treatment/Interventions: Self-care/ADL training;Patient/family education;Therapeutic activities;DME and/or AE instruction    OT Goals(Current goals can be found in the care plan section) Acute Rehab OT Goals Patient Stated Goal: home and get stronger OT Goal Formulation: With patient Time For Goal Achievement: 04/22/15 Potential to Achieve Goals: Good  OT Frequency: Min 2X/week   Barriers to D/C:            Co-evaluation              End of Session Equipment Utilized During Treatment: Rolling walker  Activity Tolerance: Patient limited by fatigue Patient left: in chair;with call bell/phone within reach;with chair alarm set   Time: 1005-1025 OT Time Calculation (min): 20 min Charges:  OT General Charges $OT Visit: 1 Procedure OT Evaluation $Initial OT Evaluation Tier I: 1 Procedure G-Codes:    Jules Schick  [id number] 04/08/2015, 10:39 AM

## 2015-04-08 NOTE — Progress Notes (Signed)
[facility] PROGRESS NOTE  Craig Waters. [id number] DOB: 05/25/1930 DOA: 04/04/2015 PCP: [name], MD   HPI/Brief narrative 81 year old male whopresented to the ED with complaints of fever started 4 days prior to admit. Patient also had nausea, dyspnea on exertion, poor by mouth intake. Patient has a history of bladder cancer and had bladder surgery done at [facility] 5 days prior to admission.  Assessment/Plan: UTI with sepsis present on admission -Patient presented with UTI and leukocytosis with lactic acidosis. -improving, continue empiric IV Rocephin, IVF -Follow urine and blood culture results, thus far no growth -change to PO cipro at DC for few more days  History of recent DVT -Patient is status post IVC filter placement -Patient is not anticoagulated secondary to ongoing hematuria.  History of bladder cancer/hematuria -Status post resection of the tumor at [facility], has foley catheter in place -Patient to follow urologist at [facility] as outpatient next week -Called and d/w Dr.Rukstalis, he said pt would have Hematuria since he has high grade invasive bladder CA, likely incurable -since no clots, will DC catheter today and watch for urinary retention -home tomorrow  Atrial fibrillation -Patient is currently in normal sinus rhythm, continue amiodarone, Cardizem. -Not a candidate for anticoagulation as above  Constipation -BM yesterday, laxatives PRN  DVT prophylaxis -SCDs   Code Status: Full Family Communication:none at bedside, called and d/w daughter Disposition Plan: ? Home tomorrow if stable  Consultants:    Procedures:    Antibiotics: Anti-infectives    Start     Dose/Rate Route Frequency Ordered Stop   04/05/15 1000  cefTRIAXone (ROCEPHIN) 2 g in dextrose 5 % 50 mL IVPB     2 g 100 mL/hr over 30 Minutes Intravenous Every 24 hours 04/05/15 0422     04/05/15 0115  cefTRIAXone (ROCEPHIN) 1 g in dextrose 5 % 50 mL IVPB     1 g 100  mL/hr over 30 Minutes Intravenous  Once 04/05/15 0111 04/05/15 0209      HPI/Subjective: No complaints. Denies sob  Objective: Filed Vitals:   04/07/15 1425 04/07/15 2038 04/08/15 0414 04/08/15 1310  BP: 147/55 160/68 155/67 137/56  Pulse: 83 81 86 85  Temp: 98.7 F (37.1 C) 98.2 F (36.8 C) 98.5 F (36.9 C) 98.2 F (36.8 C)  TempSrc: Oral Oral Oral Oral  Resp: 20 20 16 18   Height:      Weight:      SpO2: 98% 97% 95% 97%    Intake/Output Summary (Last 24 hours) at 04/08/15 1447 Last data filed at 04/08/15 1400  Gross per 24 hour  Intake   1800 ml  Output   3075 ml  Net  -1275 ml   Filed Weights   04/05/15 0401  Weight: 93.305 kg (205 lb 11.2 oz)    Exam:   General:  Awake, laying in bed, in nad  Cardiovascular: regular, s1,s 2  Respiratory: normal resp effort, no wheezing  Abdomen: soft,nondistended  Musculoskeletal: perfused, no clubbing, no cyanosis  Data Reviewed: Basic Metabolic Panel:  Recent Labs Lab 04/04/15 2257 04/05/15 0423 04/06/15 0428 04/07/15 0432 04/08/15 0433  NA 135 135 138 136 141  K 4.3 3.9 4.3 4.0 3.6  CL 102 101 108 106 108  CO2 23 26 26 24 25   GLUCOSE 147* 111* 89 89 87  BUN 22* 21* 20 18 14   CREATININE 1.91* 1.84* 1.89* 1.67* 1.66*  CALCIUM 8.4* 7.9* 8.2* 8.0* 8.0*   Liver Function Tests:  Recent Labs Lab 04/05/15 0423  AST  15  ALT 14*  ALKPHOS 93  BILITOT 0.4  PROT 5.7*  ALBUMIN 2.6*   No results for input(s): LIPASE, AMYLASE in the last 168 hours. No results for input(s): AMMONIA in the last 168 hours. CBC:  Recent Labs Lab 04/04/15 2257 04/05/15 0423 04/06/15 0428 04/07/15 0432 04/08/15 0433  WBC 18.3* 15.4* 15.5* 12.0* 12.7*  HGB 11.7* 11.2* 10.2* 10.2* 10.9*  HCT 36.7* 35.1* 32.4* 31.6* 34.1*  MCV 92.4 92.1 92.0 91.1 91.9  PLT 258 227 204 177 198   Cardiac Enzymes: No results for input(s): CKTOTAL, CKMB, CKMBINDEX, TROPONINI in the last 168 hours. BNP (last 3 results)  Recent Labs   02/15/15 1206 03/14/15 1220  BNP 522.9* 137.0*    ProBNP (last 3 results) No results for input(s): PROBNP in the last 8760 hours.  CBG: No results for input(s): GLUCAP in the last 168 hours.  Recent Results (from the past 240 hour(s))  Blood culture (routine x 2)     Status: None (Preliminary result)   Collection Time: 04/04/15 10:50 PM  Result Value Ref Range Status   Specimen Description BLOOD RIGHT HAND  Final   Special Requests BOTTLES DRAWN AEROBIC AND ANAEROBIC 5CC  Final   Culture   Final    NO GROWTH 2 DAYS Performed at [facility]    Report Status PENDING  Incomplete  Urine C&S     Status: None   Collection Time: 04/04/15 11:00 PM  Result Value Ref Range Status   Specimen Description URINE, CATHETERIZED  Final   Special Requests Normal  Final   Culture   Final    NO GROWTH 1 DAY Performed at [facility]    Report Status 04/06/2015 FINAL  Final  Blood culture (routine x 2)     Status: None (Preliminary result)   Collection Time: 04/04/15 11:47 PM  Result Value Ref Range Status   Specimen Description BLOOD LEFT ANTECUBITAL  Final   Special Requests BOTTLES DRAWN AEROBIC AND ANAEROBIC 5CC  Final   Culture   Final    NO GROWTH 2 DAYS Performed at [facility]    Report Status PENDING  Incomplete  MRSA PCR Screening     Status: None   Collection Time: 04/05/15  4:43 AM  Result Value Ref Range Status   MRSA by PCR NEGATIVE NEGATIVE Final    Comment:        The GeneXpert MRSA Assay (FDA approved for NASAL specimens only), is one component of a comprehensive MRSA colonization surveillance program. It is not intended to diagnose MRSA infection nor to guide or monitor treatment for MRSA infections.      Studies: No results found.  Scheduled Meds: . amiodarone  200 mg Oral BID  . cefTRIAXone (ROCEPHIN)  IV  2 g Intravenous Q24H  . diltiazem  240 mg Oral Daily  . predniSONE  5 mg Oral Q breakfast  . senna  1 tablet Oral BID    Continuous Infusions: . sodium chloride 75 mL/hr (04/08/15 0748)    Active Problems:   HTN (hypertension)   Constipation   Urothelial carcinoma ([place])   Acute renal failure ([place])   Hematuria   UTI (lower urinary tract infection)   [place]  [facility] Pager [phone]. If 7PM-7AM, please contact night-coverage at [email or web address], password [facility] 04/08/2015, 2:47 PM  LOS: 3 days

## 2015-04-09 LAB — CBC
HEMATOCRIT: 33.4 % — AB (ref 39.0–52.0)
Hemoglobin: 10.7 g/dL — ABNORMAL LOW (ref 13.0–17.0)
MCH: 29.3 pg (ref 26.0–34.0)
MCHC: 32 g/dL (ref 30.0–36.0)
MCV: 91.5 fL (ref 78.0–100.0)
Platelets: 181 10*3/uL (ref 150–400)
RBC: 3.65 MIL/uL — ABNORMAL LOW (ref 4.22–5.81)
RDW: 14.4 % (ref 11.5–15.5)
WBC: 12.3 10*3/uL — AB (ref 4.0–10.5)

## 2015-04-09 MED ORDER — CIPROFLOXACIN HCL 500 MG PO TABS: 500.0000 mg | ORAL_TABLET | Freq: Two times a day (BID) | ORAL | Status: DC

## 2015-04-09 NOTE — Care Management Important Message (Signed)
Important Message  Patient Details  Name: Craig Waters. MRN: [id number] Date of Birth: [birth year]   Medicare Important Message Given:  Yes    Camillo Flaming 04/09/2015, 8:58 AMImportant Message  Patient Details  Name: Craig Waters. MRN: [id number] Date of Birth: 1930/10/07   Medicare Important Message Given:  Yes    Camillo Flaming 04/09/2015, 8:58 AM

## 2015-04-09 NOTE — Progress Notes (Signed)
Completed D/C teaching with patient and daughter. Confirmed home health with case manager. Answered all questions. Patient will be D/C home in stable condition with family and home health.

## 2015-04-09 NOTE — Discharge Summary (Signed)
Physician Discharge Summary  Craig Waters. [id number] DOB: 05-19-1930 DOA: 04/04/2015  PCP: [name], MD  Admit date: 04/04/2015 Discharge date: 04/09/2015  Time spent: 45 minutes  Recommendations for Outpatient Follow-up:  1. PCP in 1 week 2. Dr.Rukstalis,(Urology) in 1 week, post op FU   Discharge Diagnoses:    Hematuria   Bladder Carcinoma   HTN (hypertension)   Constipation   Urothelial carcinoma (HCC)   Acute renal failure (HCC)   Hematuria   UTI (lower urinary tract infection)   Discharge Condition: stable  Diet recommendation: heart healthy  Filed Weights   04/05/15 0401  Weight: 93.305 kg (205 lb 11.2 oz)    History of present illness:  Chief Complaint:  Fever  HPI: 81 year old male who  has a past medical history Bladder CA/Urothelial carcinoma  12/12 came to the ED with complaints of fever started 4 days ago. Patient also had nausea, dyspnea on exertion, poor by mouth intake. Patient has a history of bladder cancer and had TURBT done at [facility] 5 days ago. Patient was admitted to the [facility] in November when he was diagnosed with right lower extremity DVT, an IVC filter was placed as patient was not a candidate for anticoagulation due to hematuria.   Hospital Course:  UTI with sepsis present on admission -Patient presented with UTI and leukocytosis with lactic acidosis. -improved, treated with empiric IV Rocephin, IVF -Urine and blood culture negative -change to PO cipro at DC for few more days  History of recent DVT -Patient is status post IVC filter placement -Patient is not anticoagulated secondary to ongoing hematuria.  History of bladder cancer/hematuria -Status post resection of the tumor at [facility], had foley catheter in place -Patient to follow urologist at [facility] as outpatient next week -I Called and d/w Dr.Rukstalis, he said pt would have Hematuria since he has high grade invasive bladder CA, likely incurable, he  recommended removing the Foley catheter we did this yesterday and patient was able to urinate without any difficulty he does still have mild hematuria, which will be a long-term problem for him per his urologist since he is not a candidate for cystectomy  Atrial fibrillation -Patient is currently in normal sinus rhythm, continue amiodarone, Cardizem. -Not a candidate for anticoagulation as above  Constipation -BM yesterday, laxatives PRN   Discharge Exam: Filed Vitals:   04/08/15 2045 04/09/15 0534  BP: 156/71 160/71  Pulse: 81 79  Temp: 97.9 F (36.6 C) 98.7 F (37.1 C)  Resp: 20 18    General: AAOx3 Cardiovascular: S1S2/RRR Respiratory: CTAB  Discharge Instructions   Discharge Instructions    Diet - low sodium heart healthy    Complete by:  As directed      Increase activity slowly    Complete by:  As directed           Current Discharge Medication List    CONTINUE these medications which have NOT CHANGED   Details  acetaminophen (TYLENOL) 650 MG CR tablet Take 1,300 mg by mouth every 6 (six) hours as needed for pain.    amiodarone (PACERONE) 200 MG tablet Take 1 tablet (200 mg total) by mouth 2 (two) times daily. Qty: 60 tablet, Refills: 0    atorvastatin (LIPITOR) 20 MG tablet Take 10 mg by mouth daily. Refills: 2    b complex vitamins tablet Take 1 tablet by mouth daily.    Calcium Citrate-Vitamin D (CALCIUM + D PO) Take 1 tablet by mouth daily.    diltiazem (  CARDIZEM CD) 240 MG 24 hr capsule Take 1 capsule (240 mg total) by mouth daily. Qty: 30 capsule, Refills: 1    hyoscyamine (LEVSIN SL) 0.125 MG SL tablet Take 0.125 mg by mouth every 4 (four) hours as needed (bladder spasms).     magnesium hydroxide (MILK OF MAGNESIA) 400 MG/5ML suspension Take 30 mLs by mouth daily as needed. For constipation.    Multiple Vitamin (MULTIVITAMIN WITH MINERALS) TABS tablet Take 1 tablet by mouth daily.    ondansetron (ZOFRAN) 4 MG tablet Take 1 tablet (4 mg total)  by mouth every 6 (six) hours as needed for nausea. Qty: 20 tablet, Refills: 0    polyethylene glycol powder (GLYCOLAX/MIRALAX) powder Take 255 g by mouth daily. Qty: 500 g, Refills: 3    predniSONE (DELTASONE) 5 MG tablet Take 5 mg by mouth daily with breakfast. .    senna (SENOKOT) 8.6 MG tablet Take 1 tablet by mouth daily as needed for constipation.     traMADol (ULTRAM) 50 MG tablet Take 50 mg by mouth every 6 (six) hours as needed for moderate pain.      STOP taking these medications     acetaminophen-codeine (TYLENOL #3) 300-30 MG tablet      oxyCODONE-acetaminophen (PERCOCET/ROXICET) 5-325 MG tablet        No Known Allergies Follow-up Information    Follow up with Caren Macadam, MD. Schedule an appointment as soon as possible for a visit in 1 week.   Specialty:  Urology   Contact information:   [place] [id number] [phone]        The results of significant diagnostics from this hospitalization (including imaging, microbiology, ancillary and laboratory) are listed below for reference.    Significant Diagnostic Studies: Ct Abdomen Pelvis Wo Contrast  03/14/2015  CLINICAL DATA:  Acute on chronic renal failure. Prostatectomy. Hypertension. Uroepithelial cancer diagnosed 2/16. Left nephrostomy tube. Possible urinary tract infection. EXAM: CT ABDOMEN AND PELVIS WITHOUT CONTRAST TECHNIQUE: Multidetector CT imaging of the abdomen and pelvis was performed following the standard protocol without IV contrast. COMPARISON:  Plain films 02/20/2015.  Most recent CT 02/15/2015. FINDINGS: Lower chest: Volume loss with probable scarring at the anterior left lung base. Mild cardiomegaly with LAD coronary artery atherosclerosis. Right pulmonary artery enlargement at 3.5 cm (image 1, series 2). Right hemidiaphragm elevation. Hepatobiliary: Suboptimal evaluation of the hypo attenuating posterior right hepatic lobe lesion secondary lack of IV contrast.  Measures on the order of 4.2 cm in anterior posterior dimension today versus similar (when remeasured). No definite new liver lesions identified. Normal gallbladder, without biliary ductal dilatation. Pancreas: Mild pancreatic atrophy, within normal variation for age. No duct dilatation. Spleen: Normal in size, without focal abnormality. Adrenals/Urinary Tract: Normal adrenal glands. Low-density bilateral renal lesions are likely cysts. Bilateral renal cortical thinning. Placement of a left-sided nephrostomy catheter since the prior CT, with resolution of hydronephrosis. Mild right-sided hydroureteronephrosis is similar. Ureteric prominence followed to the level of the urinary bladder. Distal left ureteric stone is approximately 2 cm proximal to the bladder and measures 5 mm (image 86, series 2). Similar in position to on the prior. Bladder decompressed around a Foley catheter. Soft tissue fullness throughout is consistent with the clinical history of bladder carcinoma. There is irregularity in the pericystic fat, suggesting direct tumor extension. Soft tissue density also extends anteriorly toward the median umbilical ligament (image 93, series 2). This is grossly similar and suspicious for tumor involvement. The fat plane between the seminal vesicles  and posterior wall bladder is ill-defined on image 92. Soft tissue fullness in the region of the prostatectomy bed on image 97 may represent direct tumor extension. No pelvic sidewall extension. Stomach/Bowel: Normal stomach, without wall thickening. Normal colon and terminal ileum. Normal small bowel. Vascular/Lymphatic: Aortic and branch vessel atherosclerosis. Non aneurysmal dilatation of the infrarenal aorta 2.5 cm. Similar small retroperitoneal nodes, without abdominal adenopathy. Left external iliac index node measures 10 mm on image 82 and is unchanged. Reproductive: prostatectomy. Soft tissue fullness within the prostatectomy bed is likely due to direct  bladder extension. Other: No significant free fluid. Fat containing inguinal hernias are greater on the right. No evidence of omental or peritoneal disease. Musculoskeletal: Trace L3-4 anterolisthesis. Degenerative disc disease at this level. IMPRESSION: 1. Placement of a left nephrostomy catheter with resolution of left-sided hydronephrosis. Persistent right-sided hydroureteronephrosis, presumably secondary to bladder process. 2. Left distal ureteric stone, as before. 3. Bladder decompressed around a Foley catheter. Likely locally advanced bladder carcinoma with soft tissue fullness extending about the bladder. Similar mild left pelvic adenopathy, suspicious for nodal metastasis. 4. Grossly similar right hepatic lobe low-density lesion, suboptimally evaluated secondary to lack of contrast. 5. Pulmonary artery enlargement suggests pulmonary arterial hypertension. 6.  Atherosclerosis, including within the coronary arteries. Electronically Signed   By: Abigail Miyamoto M.D.   On: 03/14/2015 18:04   Dg Chest 2 View  04/05/2015  CLINICAL DATA:  Postoperative radiograph, status post bladder surgery 6 days ago. Nausea, fever and shortness breath on exertion. Initial encounter. EXAM: CHEST  2 VIEW COMPARISON:  Chest radiograph performed 03/17/2015 FINDINGS: There is elevation of the right hemidiaphragm. Mild bibasilar atelectasis is suspected. There is no evidence of pleural effusion or pneumothorax. The heart is borderline enlarged. No acute osseous abnormalities are seen. An IVC filter and nephrostomy catheter are partially imaged. IMPRESSION: Elevation of the right hemidiaphragm. Mild bibasilar atelectasis suspected. Borderline cardiomegaly. Electronically Signed   By: Garald Balding M.D.   On: 04/05/2015 00:21   Dg Chest 2 View  03/17/2015  CLINICAL DATA:  Initial encounter for increasing shortness of breath and palpitation. Without overt EXAM: CHEST  2 VIEW COMPARISON:  03/14/2015. FINDINGS: AP and lateral views  of the chest show low volumes with asymmetric elevation of the right hemidiaphragm, unchanged in the interval. No pulmonary edema or focal airspace consolidation. No pleural effusion. Cardiopericardial silhouette is at upper limits of normal for size. Imaged bony structures of the thorax are intact. Telemetry leads overlie the chest. IMPRESSION: Stable exam.  No acute cardiopulmonary findings. Electronically Signed   By: Misty Stanley M.D.   On: 03/17/2015 16:55   Ir Ivc Filter Plmt / S&i /img Guid/mod Sed  03/16/2015  CLINICAL DATA:  81 year old male with a history of bilateral renal obstruction secondary to carcinoma. He has developed right lower extremity DVT with hematuria and is not a candidate for anti coagulation. EXAM: 1. ULTRASOUND GUIDANCE FOR VASCULAR ACCESS OF THE RIGHT INTERNAL JUGULAR VEIN. 2. IVC VENOGRAM. 3. PERCUTANEOUS IVC FILTER PLACEMENT. ANESTHESIA/SEDATION: None CONTRAST:  CO2 contrast FLUOROSCOPY TIME:  One minutes. PROCEDURE: The procedure, risks, benefits, and alternatives were explained to the patient. Specific risks discussed include bleeding, infection, contrast reaction, renal failure, IVC filter fracture, migration, ileo caval thrombus (3% incidence), need for further procedure, need for further surgery, pulmonary embolism, cardiopulmonary collapse, death. Questions regarding the procedure were encouraged and answered. The patient understands and consents to the procedure. Ultrasound survey was performed with images stored and sent to PACs. The neck  was prepped with Betadine in a sterile fashion, and a sterile drape was applied covering the operative field. A sterile gown and sterile gloves were used for the procedure. Local anesthesia was provided with 1% Lidocaine. A micropuncture needle was used access the right internal jugular vein under ultrasound. With excellent venous blood flow returned, and an .018 micro wire was passed through the needle. Small incision was made with an  11 blade scalpel. The needle was removed, and a micropuncture sheath was placed over the wire. The inner dilator and wire were removed, and an 035 Bentson wire was advanced under fluoroscopy into the IVC. Serial dilation of the soft tissue tract was performed with an 8 [place] dilator and subsequently a 10 [place] dilator. The delivery sheath for a retrievable Bard Denali filter was passed over the Bentson wire into the IVC. The wire was removed and small contrast was used to confirm IVC location. CO2 IVC cavagram performed. Dilator was removed, and the IVC filter was then delivered, positioned below the lowest renal vein. Catheter was removed. Manual pressure was used for hemostasis. Patient tolerated the procedure well and remained hemodynamically stable throughout. No complications were encountered and no significant blood loss was encounter. COMPLICATIONS: None. FINDINGS: CO2 IVC venography demonstrates a normal caliber IVC with no evidence of thrombus. Renal veins are identified bilaterally. The IVC filter was successfully positioned below the level of the renal veins and is appropriately oriented. This IVC filter has both permanent and retrievable indications. IMPRESSION: Status post placement of retrievable IVC filter. This IVC filter is potentially retrievable. The patient can be assessed for filter retrieval by Interventional Radiology in approximately 8-12 weeks if the patient becomes eligible for anti coagulation. Signed, Dulcy Fanny. Earleen Newport, DO [facility] Electronically Signed   By: Corrie Mckusick D.O.   On: 03/16/2015 18:24   Nm Myocar Multi W/spect W/wall Motion / Ef  03/29/2015  CLINICAL DATA:  Chest pain. Atrial fibrillation. Prior cardiac catheterization. Coronary artery disease. Shortness of breath. EXAM: MYOCARDIAL IMAGING WITH SPECT (REST AND PHARMACOLOGIC-STRESS) GATED LEFT VENTRICULAR WALL MOTION STUDY LEFT VENTRICULAR EJECTION FRACTION  TECHNIQUE: Standard myocardial SPECT imaging was performed after resting intravenous injection of 10 mCi Tc-48m sestamibi. Subsequently, intravenous infusion of Lexiscan was performed under the supervision of the Cardiology staff. At peak effect of the drug, 30 mCi Tc-73m sestamibi was injected intravenously and standard myocardial SPECT imaging was performed. Quantitative gated imaging was also performed to evaluate left ventricular wall motion, and estimate left ventricular ejection fraction. COMPARISON:  Chest radiograph from 03/17/2015 FINDINGS: Perfusion: Reduced inferolateral wall activity and reduced inferior wall activity at the cardiac base, moderate-sized defect, similar on stress and rest imaging, favor scar over diaphragmatic attenuation. Wall Motion: Normal left ventricular wall motion. No left ventricular dilation. Left Ventricular Ejection Fraction: 58 % End diastolic volume 91 ml End systolic volume 35 ml IMPRESSION: 1. Moderate reduced activity on stress and rest imaging in the inferolateral wall especially at the cardiac base, favor scar overt diaphragmatic attenuation. However, there is reasonably normal wall thickening and wall motion in this vicinity. . 2. Left ventricular ejection fraction 58% 3. Low-risk stress test findings*. *2012 Appropriate Use Criteria for Coronary Revascularization Focused Update: J Am Coll Cardiol. N6492421. http://content.airportbarriers.com.aspx?articleid=[email or web address] Electronically Signed   By: Van Clines M.D.   On: 03/29/2015 12:23   Nm Pulmonary Perf And Vent  03/17/2015  CLINICAL DATA:  Shortness of breath. EXAM: NUCLEAR MEDICINE VENTILATION - PERFUSION LUNG SCAN TECHNIQUE: Ventilation images  were obtained in multiple projections using inhaled aerosol Tc-55m DTPA. Perfusion images were obtained in multiple projections after intravenous injection of Tc-77m MAA. RADIOPHARMACEUTICALS:  31.0 Technetium-31m DTPA aerosol inhalation and 4.1  Technetium-16m MAA IV COMPARISON:  Chest x-ray 03/14/2015. FINDINGS: Matching ventilation perfusion defects noted. Indeterminate scan for pulmonary embolus. Elevation right hemidiaphragm is again noted. IMPRESSION: Indeterminate for pulmonary embolus. Electronically Signed   By: Marcello Moores  Register   On: 03/17/2015 16:41   Dg Chest Port 1 View  03/14/2015  CLINICAL DATA:  Shortness of breath EXAM: PORTABLE CHEST 1 VIEW COMPARISON:  02/15/2015 chest radiograph. FINDINGS: Stable cardiomediastinal silhouette with mild cardiomegaly. No pneumothorax. Possible stable trace right pleural effusion. No appreciable left pleural effusion. No overt pulmonary edema. Stable elevation of the right hemidiaphragm. Stable right basilar curvilinear opacities, favor atelectasis. No new focal lung opacity. IMPRESSION: 1. Stable mild cardiomegaly without overt pulmonary edema. 2. Stable elevation of the right hemidiaphragm with likely stable trace right pleural effusion. 3. Stable curvilinear right basal lung opacities, favor atelectasis. No new focal lung opacity. Electronically Signed   By: Ilona Sorrel M.D.   On: 03/14/2015 13:18   Dg Abd Portable 1v  04/05/2015  CLINICAL DATA:  81 year old male with constipation and nausea. EXAM: PORTABLE ABDOMEN - 1 VIEW COMPARISON:  Abdominal radiograph 02/20/2015. FINDINGS: Numerous nondilated loops of gas-filled small bowel are noted throughout the central abdomen. Gas and stool is also noted throughout the colon and distal rectum. In the right lower quadrant there is a single dilated loop of small bowel measuring up to 6.5 cm in diameter just medial to the cecum. No gross evidence of pneumoperitoneum. Left-sided double-J ureteral stent in position with proximal loop reformed over the left upper quadrant, likely in the left renal pelvis, and distal loop reformed over the central anatomic pelvis, likely in the urinary bladder. IVC filter in position with tip terminating at the low superior  endplate of L3. Unknown metallic density projecting over the central anatomic pelvis. IMPRESSION: 1. Nonspecific bowel gas pattern, as above. The possibility of early or partial small bowel obstruction should be considered. 2. No pneumoperitoneum. 3. Postoperative changes and support apparatus, as above. Electronically Signed   By: Vinnie Langton M.D.   On: 04/05/2015 17:57    Microbiology: Recent Results (from the past 240 hour(s))  Blood culture (routine x 2)     Status: None (Preliminary result)   Collection Time: 04/04/15 10:50 PM  Result Value Ref Range Status   Specimen Description BLOOD RIGHT HAND  Final   Special Requests BOTTLES DRAWN AEROBIC AND ANAEROBIC 5CC  Final   Culture   Final    NO GROWTH 3 DAYS Performed at [facility]    Report Status PENDING  Incomplete  Urine C&S     Status: None   Collection Time: 04/04/15 11:00 PM  Result Value Ref Range Status   Specimen Description URINE, CATHETERIZED  Final   Special Requests Normal  Final   Culture   Final    NO GROWTH 1 DAY Performed at [facility]    Report Status 04/06/2015 FINAL  Final  Blood culture (routine x 2)     Status: None (Preliminary result)   Collection Time: 04/04/15 11:47 PM  Result Value Ref Range Status   Specimen Description BLOOD LEFT ANTECUBITAL  Final   Special Requests BOTTLES DRAWN AEROBIC AND ANAEROBIC 5CC  Final   Culture   Final    NO GROWTH 3 DAYS Performed at [facility]  Report Status PENDING  Incomplete  MRSA PCR Screening     Status: None   Collection Time: 04/05/15  4:43 AM  Result Value Ref Range Status   MRSA by PCR NEGATIVE NEGATIVE Final    Comment:        The GeneXpert MRSA Assay (FDA approved for NASAL specimens only), is one component of a comprehensive MRSA colonization surveillance program. It is not intended to diagnose MRSA infection nor to guide or monitor treatment for MRSA infections.      Labs: Basic Metabolic Panel:  Recent  Labs Lab 04/04/15 2257 04/05/15 0423 04/06/15 0428 04/07/15 0432 04/08/15 0433  NA 135 135 138 136 141  K 4.3 3.9 4.3 4.0 3.6  CL 102 101 108 106 108  CO2 23 26 26 24 25   GLUCOSE 147* 111* 89 89 87  BUN 22* 21* 20 18 14   CREATININE 1.91* 1.84* 1.89* 1.67* 1.66*  CALCIUM 8.4* 7.9* 8.2* 8.0* 8.0*   Liver Function Tests:  Recent Labs Lab 04/05/15 0423  AST 15  ALT 14*  ALKPHOS 93  BILITOT 0.4  PROT 5.7*  ALBUMIN 2.6*   No results for input(s): LIPASE, AMYLASE in the last 168 hours. No results for input(s): AMMONIA in the last 168 hours. CBC:  Recent Labs Lab 04/05/15 0423 04/06/15 0428 04/07/15 0432 04/08/15 0433 04/09/15 0518  WBC 15.4* 15.5* 12.0* 12.7* 12.3*  HGB 11.2* 10.2* 10.2* 10.9* 10.7*  HCT 35.1* 32.4* 31.6* 34.1* 33.4*  MCV 92.1 92.0 91.1 91.9 91.5  PLT 227 204 177 198 181   Cardiac Enzymes: No results for input(s): CKTOTAL, CKMB, CKMBINDEX, TROPONINI in the last 168 hours. BNP: BNP (last 3 results)  Recent Labs  02/15/15 1206 03/14/15 1220  BNP 522.9* 137.0*    ProBNP (last 3 results) No results for input(s): PROBNP in the last 8760 hours.  CBG: No results for input(s): GLUCAP in the last 168 hours.     SignedDomenic Polite  [facility] 04/09/2015, 10:06 AM

## 2015-04-09 NOTE — Progress Notes (Signed)
Pt's daughter selected to [place]. Referral given to in house rep.

## 2015-04-10 LAB — CULTURE, BLOOD (ROUTINE X 2)
CULTURE: NO GROWTH
Culture: NO GROWTH

## 2015-05-06 ENCOUNTER — Encounter (HOSPITAL_COMMUNITY): Payer: Self-pay | Admitting: Emergency Medicine

## 2015-05-06 ENCOUNTER — Inpatient Hospital Stay (HOSPITAL_COMMUNITY)
Admission: EM | Admit: 2015-05-06 | Discharge: 2015-05-11 | DRG: 683 | Disposition: A | Discharge status: Home Health | Payer: Medicare Other | Attending: Internal Medicine | Admitting: Internal Medicine

## 2015-05-06 ENCOUNTER — Emergency Department (HOSPITAL_COMMUNITY): Payer: Medicare Other

## 2015-05-06 DIAGNOSIS — N19 Unspecified kidney failure: Secondary | ICD-10-CM

## 2015-05-06 DIAGNOSIS — E872 Acidosis: Secondary | ICD-10-CM | POA: Diagnosis present

## 2015-05-06 DIAGNOSIS — E875 Hyperkalemia: Secondary | ICD-10-CM | POA: Diagnosis present

## 2015-05-06 DIAGNOSIS — N390 Urinary tract infection, site not specified: Secondary | ICD-10-CM

## 2015-05-06 DIAGNOSIS — N179 Acute kidney failure, unspecified: Secondary | ICD-10-CM

## 2015-05-06 DIAGNOSIS — N132 Hydronephrosis with renal and ureteral calculous obstruction: Secondary | ICD-10-CM | POA: Diagnosis present

## 2015-05-06 DIAGNOSIS — N509 Disorder of male genital organs, unspecified: Secondary | ICD-10-CM

## 2015-05-06 DIAGNOSIS — K219 Gastro-esophageal reflux disease without esophagitis: Secondary | ICD-10-CM | POA: Diagnosis present

## 2015-05-06 DIAGNOSIS — N201 Calculus of ureter: Secondary | ICD-10-CM

## 2015-05-06 DIAGNOSIS — K5900 Constipation, unspecified: Secondary | ICD-10-CM

## 2015-05-06 DIAGNOSIS — Z8249 Family history of ischemic heart disease and other diseases of the circulatory system: Secondary | ICD-10-CM

## 2015-05-06 DIAGNOSIS — R300 Dysuria: Secondary | ICD-10-CM | POA: Diagnosis not present

## 2015-05-06 DIAGNOSIS — Z8711 Personal history of peptic ulcer disease: Secondary | ICD-10-CM

## 2015-05-06 DIAGNOSIS — Z86718 Personal history of other venous thrombosis and embolism: Secondary | ICD-10-CM

## 2015-05-06 DIAGNOSIS — M069 Rheumatoid arthritis, unspecified: Secondary | ICD-10-CM | POA: Diagnosis present

## 2015-05-06 DIAGNOSIS — I480 Paroxysmal atrial fibrillation: Secondary | ICD-10-CM | POA: Diagnosis present

## 2015-05-06 DIAGNOSIS — N1330 Unspecified hydronephrosis: Secondary | ICD-10-CM

## 2015-05-06 DIAGNOSIS — I129 Hypertensive chronic kidney disease with stage 1 through stage 4 chronic kidney disease, or unspecified chronic kidney disease: Secondary | ICD-10-CM | POA: Diagnosis present

## 2015-05-06 DIAGNOSIS — C679 Malignant neoplasm of bladder, unspecified: Secondary | ICD-10-CM | POA: Diagnosis present

## 2015-05-06 DIAGNOSIS — H409 Unspecified glaucoma: Secondary | ICD-10-CM | POA: Diagnosis present

## 2015-05-06 DIAGNOSIS — Z87891 Personal history of nicotine dependence: Secondary | ICD-10-CM

## 2015-05-06 DIAGNOSIS — N451 Epididymitis: Secondary | ICD-10-CM

## 2015-05-06 DIAGNOSIS — E860 Dehydration: Secondary | ICD-10-CM | POA: Diagnosis present

## 2015-05-06 DIAGNOSIS — K56609 Unspecified intestinal obstruction, unspecified as to partial versus complete obstruction: Secondary | ICD-10-CM

## 2015-05-06 DIAGNOSIS — N183 Chronic kidney disease, stage 3 (moderate): Secondary | ICD-10-CM | POA: Diagnosis present

## 2015-05-06 DIAGNOSIS — N400 Enlarged prostate without lower urinary tract symptoms: Secondary | ICD-10-CM | POA: Diagnosis present

## 2015-05-06 DIAGNOSIS — R310 Gross hematuria: Secondary | ICD-10-CM | POA: Diagnosis present

## 2015-05-06 DIAGNOSIS — C689 Malignant neoplasm of urinary organ, unspecified: Secondary | ICD-10-CM | POA: Diagnosis present

## 2015-05-06 DIAGNOSIS — N50819 Testicular pain, unspecified: Secondary | ICD-10-CM

## 2015-05-06 LAB — URINALYSIS, ROUTINE W REFLEX MICROSCOPIC
Bilirubin Urine: NEGATIVE
GLUCOSE, UA: NEGATIVE mg/dL
Ketones, ur: NEGATIVE mg/dL
NITRITE: NEGATIVE
Specific Gravity, Urine: 1.027 (ref 1.005–1.030)
pH: 7.5 (ref 5.0–8.0)

## 2015-05-06 LAB — COMPREHENSIVE METABOLIC PANEL
ALT: 12 U/L — AB (ref 17–63)
AST: 18 U/L (ref 15–41)
Albumin: 3.5 g/dL (ref 3.5–5.0)
Alkaline Phosphatase: 108 U/L (ref 38–126)
Anion gap: 12 (ref 5–15)
BUN: 36 mg/dL — ABNORMAL HIGH (ref 6–20)
CHLORIDE: 101 mmol/L (ref 101–111)
CO2: 24 mmol/L (ref 22–32)
CREATININE: 3.01 mg/dL — AB (ref 0.61–1.24)
Calcium: 9 mg/dL (ref 8.9–10.3)
GFR, EST AFRICAN AMERICAN: 21 mL/min — AB (ref >60)
GFR, EST NON AFRICAN AMERICAN: 18 mL/min — AB (ref >60)
Glucose, Bld: 116 mg/dL — ABNORMAL HIGH (ref 65–99)
POTASSIUM: 5.5 mmol/L — AB (ref 3.5–5.1)
Sodium: 137 mmol/L (ref 135–145)
TOTAL PROTEIN: 7.2 g/dL (ref 6.5–8.1)
Total Bilirubin: 0.5 mg/dL (ref 0.3–1.2)

## 2015-05-06 LAB — CBC
HCT: 41.3 % (ref 39.0–52.0)
Hemoglobin: 13 g/dL (ref 13.0–17.0)
MCH: 28.7 pg (ref 26.0–34.0)
MCHC: 31.5 g/dL (ref 30.0–36.0)
MCV: 91.2 fL (ref 78.0–100.0)
PLATELETS: 296 10*3/uL (ref 150–400)
RBC: 4.53 MIL/uL (ref 4.22–5.81)
RDW: 14.6 % (ref 11.5–15.5)
WBC: 13.3 10*3/uL — AB (ref 4.0–10.5)

## 2015-05-06 LAB — URINE MICROSCOPIC-ADD ON: SQUAMOUS EPITHELIAL / LPF: NONE SEEN

## 2015-05-06 LAB — LIPASE, BLOOD: LIPASE: 21 U/L (ref 11–51)

## 2015-05-06 MED ORDER — SODIUM CHLORIDE 0.9 % IV BOLUS (SEPSIS)
500.0000 mL | Freq: Once | INTRAVENOUS | Status: AC
  Administered 2015-05-07: 500 mL via INTRAVENOUS

## 2015-05-06 MED ORDER — SODIUM CHLORIDE 0.9 % IV BOLUS (SEPSIS): 1000.0000 mL | Freq: Once | INTRAVENOUS | Status: DC

## 2015-05-06 MED ORDER — DEXTROSE 5 % IV SOLN
1.0000 g | Freq: Once | INTRAVENOUS | Status: AC
  Administered 2015-05-07: 1 g via INTRAVENOUS
  Filled 2015-05-06: qty 10

## 2015-05-06 MED ORDER — IOHEXOL 300 MG/ML  SOLN
25.0000 mL | Freq: Once | INTRAMUSCULAR | Status: AC | PRN
  Administered 2015-05-06: 25 mL via ORAL

## 2015-05-06 MED ORDER — HYDROMORPHONE HCL 1 MG/ML IJ SOLN
1.0000 mg | Freq: Once | INTRAMUSCULAR | Status: AC
  Administered 2015-05-07: 1 mg via INTRAVENOUS
  Filled 2015-05-06: qty 1

## 2015-05-06 NOTE — ED Notes (Signed)
Patient states he has not had a bowel movement for 2 days; has not passed any gas today. Patient is on tylenol-3 for joint pain; takes miralax 2 times a day, stool softeners every evening, and prunes without relief for the past 2 days. Patient has had trouble with constipation for the past year. Hx of enlarged prostate; patient noticed blood in urine today.

## 2015-05-06 NOTE — ED Provider Notes (Signed)
CSN: [id number]     Arrival date & time 05/06/15  1440 History   First MD Initiated Contact with Patient 05/06/15 2213     Chief Complaint  Patient presents with  . Abdominal Pain  . Constipation     (Consider location/radiation/quality/duration/timing/severity/associated sxs/prior Treatment) HPI  Craig Waters is an 82 y.o M with a pmhx of BPH, bladder cancer s/p ureteral stent placement, A. fib, discitis, diverticulitis who presents to the emergency department today complaining of constipation and testicular pain. Patient states that he has not had bowel movement in 3 days and has been unable to pass gas over the last 48 hours. Patient reports abdominal distention and tightness. No associated nausea, vomiting, abdominal pain. Patient is able to tolerate PO. Patient also endorses testicular pain that has progressively worsened over the last 2 weeks. Patient began noticing blood in his urine 3 days ago. Denies dysuria, fever, chills, flank pain.  Past Medical History  Diagnosis Date  . Blood in stool   . Diverticulitis   . GERD (gastroesophageal reflux disease)   . Glaucoma   . Ulcer   . History of transfusion of whole blood   . Prostate disorder   . Shortness of breath 10-18-11    with exertion, cardiac cath done 7-8 yrs ago negative.  . Vertigo 10-18-11    inner ear issues occ. -tx. Bonine as needed  . Arthritis 10-18-11    back, fingers  . H/O hiatal hernia 10-18-11    noted on recent CXR  . Adenomatous colon polyp   . Urothelial carcinoma ([place]) 05/2014  . PAF (paroxysmal atrial fibrillation) ([place])   . Rheumatoid arthritis ([place])   . Discitis of thoracic region 07/04/2012   Past Surgical History  Procedure Laterality Date  . Gastric ulcer  10-18-11    '91-blleding ulcer with blood transfusions after  . Appendectomy  10-18-11    age 55  . Cataract extraction, bilateral  10-18-11    bilateral   . Eye surgery  10-18-11    laser eye surgery s/p cataract bilaterally  . Cardiac  catheterization  10-18-11    7-8 yrs ago-mild blockage,no tx. indicated  . Prostatectomy  10/27/2011    Procedure: PROSTATECTOMY SUPRAPUBIC;  Surgeon: Ailene Rud, MD;  Location: [facility];  Service: Urology;  Laterality: N/A;  Placement of suprapubic tube  . Cystoscopy  10/27/2011    Procedure: CYSTOSCOPY FLEXIBLE;  Surgeon: Ailene Rud, MD;  Location: [facility];  Service: Urology;  Laterality: N/A;  . Transurethral resection of bladder tumor with gyrus (turbt-gyrus)  06/2014    T1 HG tumor.  [facility].   Family History  Problem Relation Age of Onset  . Breast cancer Sister   . Dementia Mother   . Heart attack Father   . Colon cancer Neg Hx    Social History  Substance Use Topics  . Smoking status: Former Smoker -- 1.00 packs/day for 5 years    Types: Cigarettes    Quit date: 09/26/1978  . Smokeless tobacco: Current User    Types: Chew    Last Attempt to Quit: 11/01/2011  . Alcohol Use: No    Review of Systems  All other systems reviewed and are negative.     Allergies  Review of patient's allergies indicates no known allergies.  Home Medications   Prior to Admission medications   Medication Sig Start Date End Date Taking? Authorizing Provider  acetaminophen (TYLENOL) 650 MG CR tablet Take 1,300 mg by mouth every 6 (  six) hours as needed for pain.   Yes Historical Provider, MD  acetaminophen-codeine (TYLENOL #3) 300-30 MG tablet Take 1 tablet by mouth daily as needed for moderate pain.   Yes Historical Provider, MD  acidophilus (RISAQUAD) CAPS capsule Take 1 capsule by mouth daily.   Yes Historical Provider, MD  amiodarone (PACERONE) 200 MG tablet Take 1 tablet (200 mg total) by mouth 2 (two) times daily. 03/26/15  Yes Hosie Poisson, MD  b complex vitamins tablet Take 1 tablet by mouth daily.   Yes Historical Provider, MD  bisacodyl (DULCOLAX) 5 MG EC tablet Take 5 mg by mouth daily as needed for moderate constipation.   Yes Historical Provider, MD  diltiazem  (CARDIZEM CD) 240 MG 24 hr capsule Take 1 capsule (240 mg total) by mouth daily. 03/19/15  Yes Hosie Poisson, MD  hyoscyamine (LEVSIN SL) 0.125 MG SL tablet Take 0.125 mg by mouth every 4 (four) hours as needed (bladder spasms).  02/01/15  Yes Historical Provider, MD  ondansetron (ZOFRAN) 4 MG tablet Take 1 tablet (4 mg total) by mouth every 6 (six) hours as needed for nausea. 02/22/15  Yes Donne Hazel, MD  polyethylene glycol powder (GLYCOLAX/MIRALAX) powder Take 255 g by mouth daily. Patient taking differently: Take 17 g by mouth 2 (two) times daily as needed (for constipation).  11/27/14  Yes Willia Craze, NP  predniSONE (DELTASONE) 5 MG tablet Take 15 mg by mouth daily with breakfast. .   Yes Historical Provider, MD  promethazine (PHENERGAN) 12.5 MG tablet Take 12.5 mg by mouth every 6 (six) hours as needed for nausea or vomiting.   Yes Historical Provider, MD  ciprofloxacin (CIPRO) 500 MG tablet Take 1 tablet (500 mg total) by mouth 2 (two) times daily. For 3days Patient not taking: Reported on 05/06/2015 04/09/15   Domenic Polite, MD  Multiple Vitamin (MULTIVITAMIN WITH MINERALS) TABS tablet Take 1 tablet by mouth daily. Patient not taking: Reported on 05/06/2015 05/15/14   Erline Hau, MD   BP 190/93 mmHg  Pulse 85  Temp(Src) 98.3 F (36.8 C) (Oral)  Resp 18  Ht 5\' 11"  (1.803 m)  Wt 92.987 kg  BMI 28.60 kg/m2  SpO2 96% Physical Exam  Constitutional: He is oriented to person, place, and time. He appears well-developed and well-nourished. No distress.  HENT:  Head: Normocephalic and atraumatic.  Mouth/Throat: No oropharyngeal exudate.  Eyes: Conjunctivae and EOM are normal. Pupils are equal, round, and reactive to light. Right eye exhibits no discharge. Left eye exhibits no discharge. No scleral icterus.  Cardiovascular: Normal rate, regular rhythm, normal heart sounds and intact distal pulses.  Exam reveals no gallop and no friction rub.   No murmur  heard. Pulmonary/Chest: Effort normal and breath sounds normal. No respiratory distress. He has no wheezes. He has no rales. He exhibits no tenderness.  Abdominal: Soft. Bowel sounds are normal. He exhibits distension. There is no tenderness. There is no guarding.  Tympanic to percussion.  Genitourinary:  TTP over bilateral testicles. No enlargement. No penile discharge.   Musculoskeletal: Normal range of motion. He exhibits no edema.  Neurological: He is alert and oriented to person, place, and time.  Strength 5/5 throughout. No sensory deficits.  No gait abnormality.  Skin: Skin is warm and dry. No rash noted. He is not diaphoretic. No erythema. No pallor.  Psychiatric: He has a normal mood and affect. His behavior is normal.  Nursing note and vitals reviewed.   ED Course  Procedures (including  critical care time) Labs Review Labs Reviewed  COMPREHENSIVE METABOLIC PANEL - Abnormal; Notable for the following:    Potassium 5.5 (*)    Glucose, Bld 116 (*)    BUN 36 (*)    Creatinine, Ser 3.01 (*)    ALT 12 (*)    GFR calc non Af Amer 18 (*)    GFR calc Af Amer 21 (*)    All other components within normal limits  CBC - Abnormal; Notable for the following:    WBC 13.3 (*)    All other components within normal limits  URINALYSIS, ROUTINE W REFLEX MICROSCOPIC (NOT AT [facility]) - Abnormal; Notable for the following:    Color, Urine RED (*)    APPearance TURBID (*)    Hgb urine dipstick LARGE (*)    Protein, ur >300 (*)    Leukocytes, UA LARGE (*)    All other components within normal limits  URINE MICROSCOPIC-ADD ON - Abnormal; Notable for the following:    Bacteria, UA FEW (*)    All other components within normal limits  LIPASE, BLOOD    Imaging Review Ct Abdomen Pelvis Wo Contrast  05/07/2015  CLINICAL DATA:  Stated history of small bowel obstruction. Patient reports no bowel movement for 2 days, has not passed gas today. Hematuria. EXAM: CT ABDOMEN AND PELVIS WITHOUT CONTRAST  TECHNIQUE: Multidetector CT imaging of the abdomen and pelvis was performed following the standard protocol without IV contrast. COMPARISON:  CT 03/14/2015 FINDINGS: Lower chest: Elevation of right hemidiaphragm with adjacent compressive atelectasis in the right middle lobe. Coronary artery calcifications are seen. Liver: Hypodense subcapsular liver lesion involving the right lobe with likely increased in size from prior exam, currently 5.4 cm, previously 4.2 cm. Detailed hepatic evaluation limited secondary to lack of intravenous contrast. Hepatobiliary: Gallbladder physiologically distended, no calcified stone. No biliary dilatation. Pancreas: Fatty atrophy, no ductal dilatation. Spleen: Unremarkable noncontrast appearance. Adrenal glands: No nodule. Kidneys: Left ureteral stent in place. 36mm stone is seen adjacent to the stent in the left distal ureter, similar in position to prior. The previous left nephrostomy tube is no longer seen. There is mild dilatation of the renal pelvis and proximal ureter despite stent placement. Mild right hydroureteronephrosis is similar to prior, with ureteral dilatation throughout its course hypodense lesions within the right kidney, likely cysts. Stomach/Bowel: Stomach mildly distended with ingested contrast. There are no dilated or thickened small bowel loops. Small bowel loops are decompressed. Moderate stool in the right and transverse colon, small volume of stool in the descending and rectosigmoid colon. Equivocal colonic wall thickening involving the distal sigmoid. Appendix is not seen, surgically absent. Vascular/Lymphatic: Small retroperitoneal lymph nodes, not enlarged by size criteria. Left pelvic sidewall node, 10 mm short axis. Abdominal aorta is normal in caliber. Moderate atherosclerosis without aneurysm. An IVC filter is in place. Reproductive: Post prostatectomy per report. Bladder: Marked wall irregularity and thickening. Minimal urine in the bladder lumen.  Perivesicular inflammatory change, mildly increased in the interim. Other: No free air, free fluid, or intra-abdominal fluid collection. Fat within both inguinal canals. Musculoskeletal: There are no acute or suspicious osseous abnormalities. Stable degenerative change in the spine. Anterolisthesis of L3 on L4 with associated degenerative change. No destructive lytic or blastic osseous lesion. IMPRESSION: 1. No bowel obstruction. Low moderate stool burden in the right and transverse colon, with decompressed distal colon. Equivocal wall thickening of the distal sigmoid colon, may reflect minimal colitis in the appropriate clinical setting. 2. Left ureteral stent with  minimal residual hydronephrosis and proximal ureter. 5 mm stone in the distal left ureter adjacent to the stent is seen. Unchanged right hydroureteronephrosis. 3. Diffuse bladder wall thickening and irregularity, with minimal increased thin perivesicular inflammation from prior. Small left pelvic lymph node is unchanged, concerning for nodal disease. 4. Right lobe hepatic lesion has minimally increased in size in the interim, concerning for progression of metastatic disease. Electronically Signed   By: Jeb Levering M.D.   On: 05/07/2015 01:24   US Scrotum  05/07/2015  CLINICAL DATA:  Bilateral testicular tenderness.  Chronic pain. EXAM: SCROTAL ULTRASOUND DOPPLER ULTRASOUND OF THE TESTICLES TECHNIQUE: Complete ultrasound examination of the testicles, epididymis, and other scrotal structures was performed. Color and spectral Doppler ultrasound were also utilized to evaluate blood flow to the testicles. COMPARISON:  None. FINDINGS: Right testicle Measurements: 4.4 x 2.6 x 2.7 cm. No mass or microlithiasis visualized. Left testicle Measurements: 3.8 x 2.7 x 2.8 cm. No mass or microlithiasis visualized. Right epididymis:  Normal in size and appearance. Left epididymis: Small cyst in the epididymal tail measuring 3.4 mm. Normal in size and appearance.  Hydrocele:  None visualized. Varicocele:  Mild on the left. Pulsed Doppler interrogation of both testes demonstrates normal low resistance arterial and venous waveforms bilaterally. IMPRESSION: 1. Normal sonographic appearance of the testicles. 2. Small left varicocele.  Tiny left epididymal head cyst. Electronically Signed   By: Jeb Levering M.D.   On: 05/07/2015 02:01   [place] Art/ven Flow Abd Pelv Doppler  05/07/2015  CLINICAL DATA:  Bilateral testicular tenderness.  Chronic pain. EXAM: SCROTAL ULTRASOUND DOPPLER ULTRASOUND OF THE TESTICLES TECHNIQUE: Complete ultrasound examination of the testicles, epididymis, and other scrotal structures was performed. Color and spectral Doppler ultrasound were also utilized to evaluate blood flow to the testicles. COMPARISON:  None. FINDINGS: Right testicle Measurements: 4.4 x 2.6 x 2.7 cm. No mass or microlithiasis visualized. Left testicle Measurements: 3.8 x 2.7 x 2.8 cm. No mass or microlithiasis visualized. Right epididymis:  Normal in size and appearance. Left epididymis: Small cyst in the epididymal tail measuring 3.4 mm. Normal in size and appearance. Hydrocele:  None visualized. Varicocele:  Mild on the left. Pulsed Doppler interrogation of both testes demonstrates normal low resistance arterial and venous waveforms bilaterally. IMPRESSION: 1. Normal sonographic appearance of the testicles. 2. Small left varicocele.  Tiny left epididymal head cyst. Electronically Signed   By: Jeb Levering M.D.   On: 05/07/2015 02:01   I have personally reviewed and evaluated these images and lab results as part of my medical decision-making.   EKG Interpretation None      MDM   Final diagnoses:  AKI (acute kidney injury) ([place])  UTI (lower urinary tract infection)  Calculus of ureter  Constipation, unspecified constipation type    82 y.o m with pmhx of bladder Ca, s/p ureteral stent placement presents for constipation and hematuria. On presentation, pt appears  well in NAD. Afebrile. All VSS. Abd is distended. Pt reports no BM or flatulence in >48 hrs. Concern for SBO. Will order CT abd. Pt also with testicular pain. Concern for possible epididymitis. Will obtain scrotal US.   Leukocytosis present. WBC 13.3. UA appears grossly infected. Rocephin given.  Creatinine is elevated at 3. This has doubled compared to previous study. Pt with AKI. Fluids given.   CT reveals no SBO. However, 51mm distal stone in distal left ureter is present. Minimal hydronephrosis. Diffuse bladder wall thickening and irregularity. Right lobe hepatic lesion that has minimally increased in  size.   Fleet enema adminsitered which provided minimal output and relief.   Scrotal US reveals normal sonographic appearance of testicles. Small left varicocele. Tiny left epididymal head cyst.   Spoke with hospitalist to recommend admission for AKI and UTI.  Spoke with urology who will consult pt on the floor. No additional recommendations at this time.   Patient was discussed with and seen by Dr. Alfonse Spruce who agrees with the treatment plan.       Dondra Spry [place], PA-C 05/07/15 0301  Harvel Quale, MD 05/14/15 [phone]

## 2015-05-06 NOTE — ED Notes (Signed)
Unable to collect labs not enough blood

## 2015-05-07 ENCOUNTER — Emergency Department (HOSPITAL_COMMUNITY): Payer: Medicare Other

## 2015-05-07 DIAGNOSIS — N400 Enlarged prostate without lower urinary tract symptoms: Secondary | ICD-10-CM | POA: Diagnosis present

## 2015-05-07 DIAGNOSIS — K219 Gastro-esophageal reflux disease without esophagitis: Secondary | ICD-10-CM

## 2015-05-07 DIAGNOSIS — H409 Unspecified glaucoma: Secondary | ICD-10-CM | POA: Diagnosis present

## 2015-05-07 DIAGNOSIS — N1330 Unspecified hydronephrosis: Secondary | ICD-10-CM | POA: Diagnosis not present

## 2015-05-07 DIAGNOSIS — R319 Hematuria, unspecified: Secondary | ICD-10-CM | POA: Diagnosis not present

## 2015-05-07 DIAGNOSIS — R300 Dysuria: Secondary | ICD-10-CM | POA: Diagnosis present

## 2015-05-07 DIAGNOSIS — Z8249 Family history of ischemic heart disease and other diseases of the circulatory system: Secondary | ICD-10-CM | POA: Diagnosis not present

## 2015-05-07 DIAGNOSIS — E875 Hyperkalemia: Secondary | ICD-10-CM | POA: Diagnosis present

## 2015-05-07 DIAGNOSIS — Z87891 Personal history of nicotine dependence: Secondary | ICD-10-CM | POA: Diagnosis not present

## 2015-05-07 DIAGNOSIS — N390 Urinary tract infection, site not specified: Secondary | ICD-10-CM

## 2015-05-07 DIAGNOSIS — Z86718 Personal history of other venous thrombosis and embolism: Secondary | ICD-10-CM | POA: Diagnosis not present

## 2015-05-07 DIAGNOSIS — N132 Hydronephrosis with renal and ureteral calculous obstruction: Secondary | ICD-10-CM | POA: Diagnosis present

## 2015-05-07 DIAGNOSIS — M069 Rheumatoid arthritis, unspecified: Secondary | ICD-10-CM | POA: Diagnosis present

## 2015-05-07 DIAGNOSIS — E872 Acidosis: Secondary | ICD-10-CM | POA: Diagnosis present

## 2015-05-07 DIAGNOSIS — R310 Gross hematuria: Secondary | ICD-10-CM | POA: Diagnosis present

## 2015-05-07 DIAGNOSIS — N183 Chronic kidney disease, stage 3 (moderate): Secondary | ICD-10-CM | POA: Diagnosis present

## 2015-05-07 DIAGNOSIS — Z8711 Personal history of peptic ulcer disease: Secondary | ICD-10-CM | POA: Diagnosis not present

## 2015-05-07 DIAGNOSIS — C679 Malignant neoplasm of bladder, unspecified: Secondary | ICD-10-CM | POA: Diagnosis present

## 2015-05-07 DIAGNOSIS — K5909 Other constipation: Secondary | ICD-10-CM | POA: Diagnosis not present

## 2015-05-07 DIAGNOSIS — N179 Acute kidney failure, unspecified: Secondary | ICD-10-CM | POA: Diagnosis present

## 2015-05-07 DIAGNOSIS — E860 Dehydration: Secondary | ICD-10-CM | POA: Diagnosis present

## 2015-05-07 DIAGNOSIS — I480 Paroxysmal atrial fibrillation: Secondary | ICD-10-CM | POA: Diagnosis present

## 2015-05-07 DIAGNOSIS — I129 Hypertensive chronic kidney disease with stage 1 through stage 4 chronic kidney disease, or unspecified chronic kidney disease: Secondary | ICD-10-CM | POA: Diagnosis present

## 2015-05-07 DIAGNOSIS — K5900 Constipation, unspecified: Secondary | ICD-10-CM | POA: Diagnosis present

## 2015-05-07 LAB — BASIC METABOLIC PANEL
Anion gap: 13 (ref 5–15)
BUN: 41 mg/dL — ABNORMAL HIGH (ref 6–20)
CALCIUM: 8.8 mg/dL — AB (ref 8.9–10.3)
CO2: 21 mmol/L — AB (ref 22–32)
CREATININE: 4.08 mg/dL — AB (ref 0.61–1.24)
Chloride: 102 mmol/L (ref 101–111)
GFR calc non Af Amer: 12 mL/min — ABNORMAL LOW (ref >60)
GFR, EST AFRICAN AMERICAN: 14 mL/min — AB (ref >60)
Glucose, Bld: 100 mg/dL — ABNORMAL HIGH (ref 65–99)
Potassium: 5.3 mmol/L — ABNORMAL HIGH (ref 3.5–5.1)
SODIUM: 136 mmol/L (ref 135–145)

## 2015-05-07 LAB — CBC
HCT: 40 % (ref 39.0–52.0)
Hemoglobin: 12.4 g/dL — ABNORMAL LOW (ref 13.0–17.0)
MCH: 28.7 pg (ref 26.0–34.0)
MCHC: 31 g/dL (ref 30.0–36.0)
MCV: 92.6 fL (ref 78.0–100.0)
Platelets: 309 10*3/uL (ref 150–400)
RBC: 4.32 MIL/uL (ref 4.22–5.81)
RDW: 14.7 % (ref 11.5–15.5)
WBC: 14.1 10*3/uL — ABNORMAL HIGH (ref 4.0–10.5)

## 2015-05-07 LAB — BASIC METABOLIC PANEL WITH GFR
Anion gap: 9 (ref 5–15)
BUN: 43 mg/dL — ABNORMAL HIGH (ref 6–20)
CO2: 21 mmol/L — ABNORMAL LOW (ref 22–32)
Calcium: 8 mg/dL — ABNORMAL LOW (ref 8.9–10.3)
Chloride: 103 mmol/L (ref 101–111)
Creatinine, Ser: 4.66 mg/dL — ABNORMAL HIGH (ref 0.61–1.24)
GFR calc Af Amer: 12 mL/min — ABNORMAL LOW
GFR calc non Af Amer: 11 mL/min — ABNORMAL LOW
Glucose, Bld: 113 mg/dL — ABNORMAL HIGH (ref 65–99)
Potassium: 5 mmol/L (ref 3.5–5.1)
Sodium: 133 mmol/L — ABNORMAL LOW (ref 135–145)

## 2015-05-07 LAB — MRSA PCR SCREENING: MRSA BY PCR: NEGATIVE

## 2015-05-07 LAB — CBG MONITORING, ED: Glucose-Capillary: 106 mg/dL — ABNORMAL HIGH (ref 65–99)

## 2015-05-07 LAB — PROTIME-INR
INR: 1 (ref 0.00–1.49)
PROTHROMBIN TIME: 13.4 s (ref 11.6–15.2)

## 2015-05-07 LAB — APTT: APTT: 36 s (ref 24–37)

## 2015-05-07 LAB — LACTIC ACID, PLASMA
LACTIC ACID, VENOUS: 1.3 mmol/L (ref 0.5–2.0)
Lactic Acid, Venous: 1.7 mmol/L (ref 0.5–2.0)

## 2015-05-07 LAB — PROCALCITONIN: Procalcitonin: 0.73 ng/mL

## 2015-05-07 MED ORDER — SODIUM CHLORIDE 0.9 % IJ SOLN
3.0000 mL | Freq: Two times a day (BID) | INTRAMUSCULAR | Status: DC
  Administered 2015-05-07 – 2015-05-10 (×6): 3 mL via INTRAVENOUS

## 2015-05-07 MED ORDER — SODIUM POLYSTYRENE SULFONATE 15 GM/60ML PO SUSP
30.0000 g | Freq: Once | ORAL | Status: AC
  Administered 2015-05-07: 30 g via ORAL
  Filled 2015-05-07: qty 120

## 2015-05-07 MED ORDER — RISAQUAD PO CAPS
1.0000 | ORAL_CAPSULE | Freq: Every day | ORAL | Status: DC
  Administered 2015-05-07 – 2015-05-11 (×5): 1 via ORAL
  Filled 2015-05-07 (×5): qty 1

## 2015-05-07 MED ORDER — POLYETHYLENE GLYCOL 3350 17 GM/SCOOP PO POWD
17.0000 g | Freq: Two times a day (BID) | ORAL | Status: DC | PRN
  Filled 2015-05-07: qty 255

## 2015-05-07 MED ORDER — ONDANSETRON HCL 4 MG/2ML IJ SOLN
4.0000 mg | Freq: Four times a day (QID) | INTRAMUSCULAR | Status: DC | PRN
  Administered 2015-05-08 – 2015-05-10 (×4): 4 mg via INTRAVENOUS
  Filled 2015-05-07 (×4): qty 2

## 2015-05-07 MED ORDER — ACETAMINOPHEN 325 MG PO TABS
650.0000 mg | ORAL_TABLET | Freq: Four times a day (QID) | ORAL | Status: DC | PRN
  Administered 2015-05-08 – 2015-05-11 (×5): 650 mg via ORAL
  Filled 2015-05-07 (×5): qty 2

## 2015-05-07 MED ORDER — SODIUM CHLORIDE 0.9 % IV BOLUS (SEPSIS)
500.0000 mL | Freq: Once | INTRAVENOUS | Status: AC
  Administered 2015-05-07: 500 mL via INTRAVENOUS

## 2015-05-07 MED ORDER — SENNOSIDES-DOCUSATE SODIUM 8.6-50 MG PO TABS
1.0000 | ORAL_TABLET | Freq: Two times a day (BID) | ORAL | Status: DC
  Administered 2015-05-07 – 2015-05-09 (×6): 1 via ORAL
  Filled 2015-05-07 (×6): qty 1

## 2015-05-07 MED ORDER — HYOSCYAMINE SULFATE 0.125 MG SL SUBL
0.1250 mg | SUBLINGUAL_TABLET | SUBLINGUAL | Status: DC | PRN
  Administered 2015-05-08 – 2015-05-11 (×6): 0.125 mg via ORAL
  Filled 2015-05-07 (×9): qty 1

## 2015-05-07 MED ORDER — HYDRALAZINE HCL 20 MG/ML IJ SOLN: 5.0000 mg | INTRAMUSCULAR | Status: DC | PRN

## 2015-05-07 MED ORDER — ONDANSETRON HCL 4 MG PO TABS: 4.0000 mg | ORAL_TABLET | Freq: Four times a day (QID) | ORAL | Status: DC | PRN

## 2015-05-07 MED ORDER — FLEET ENEMA 7-19 GM/118ML RE ENEM
1.0000 | ENEMA | Freq: Once | RECTAL | Status: AC
  Administered 2015-05-07: 1 via RECTAL
  Filled 2015-05-07: qty 1

## 2015-05-07 MED ORDER — SODIUM CHLORIDE 0.9 % IV SOLN
INTRAVENOUS | Status: DC
  Administered 2015-05-07 – 2015-05-09 (×5): via INTRAVENOUS

## 2015-05-07 MED ORDER — ACETAMINOPHEN 650 MG RE SUPP: 650.0000 mg | Freq: Four times a day (QID) | RECTAL | Status: DC | PRN

## 2015-05-07 MED ORDER — DILTIAZEM HCL ER COATED BEADS 240 MG PO CP24
240.0000 mg | ORAL_CAPSULE | Freq: Every day | ORAL | Status: DC
  Administered 2015-05-07 – 2015-05-11 (×5): 240 mg via ORAL
  Filled 2015-05-07 (×5): qty 1

## 2015-05-07 MED ORDER — CEFTRIAXONE SODIUM 1 G IJ SOLR
1.0000 g | INTRAMUSCULAR | Status: DC
  Administered 2015-05-07 – 2015-05-11 (×5): 1 g via INTRAVENOUS
  Filled 2015-05-07 (×5): qty 10

## 2015-05-07 MED ORDER — B COMPLEX-C PO TABS
1.0000 | ORAL_TABLET | Freq: Every day | ORAL | Status: DC
  Administered 2015-05-07 – 2015-05-11 (×5): 1 via ORAL
  Filled 2015-05-07 (×5): qty 1

## 2015-05-07 MED ORDER — BISACODYL 5 MG PO TBEC
5.0000 mg | DELAYED_RELEASE_TABLET | Freq: Every day | ORAL | Status: DC | PRN
  Administered 2015-05-07 – 2015-05-09 (×2): 5 mg via ORAL
  Filled 2015-05-07 (×2): qty 1

## 2015-05-07 MED ORDER — SODIUM CHLORIDE 0.9 % IV BOLUS (SEPSIS)
1000.0000 mL | Freq: Once | INTRAVENOUS | Status: AC
  Administered 2015-05-07: 1000 mL via INTRAVENOUS

## 2015-05-07 MED ORDER — PANTOPRAZOLE SODIUM 40 MG IV SOLR
40.0000 mg | INTRAVENOUS | Status: DC
  Administered 2015-05-07 – 2015-05-08 (×3): 40 mg via INTRAVENOUS
  Filled 2015-05-07 (×3): qty 40

## 2015-05-07 MED ORDER — AMIODARONE HCL 200 MG PO TABS
200.0000 mg | ORAL_TABLET | Freq: Two times a day (BID) | ORAL | Status: DC
  Administered 2015-05-07 – 2015-05-11 (×9): 200 mg via ORAL
  Filled 2015-05-07 (×9): qty 1

## 2015-05-07 MED ORDER — ONDANSETRON HCL 4 MG PO TABS
4.0000 mg | ORAL_TABLET | Freq: Four times a day (QID) | ORAL | Status: DC | PRN
Start: 2015-05-07 — End: 2015-05-07

## 2015-05-07 MED ORDER — POLYETHYLENE GLYCOL 3350 17 G PO PACK
17.0000 g | PACK | Freq: Two times a day (BID) | ORAL | Status: DC | PRN
  Administered 2015-05-09: 17 g via ORAL
  Filled 2015-05-07: qty 1

## 2015-05-07 MED ORDER — PREDNISONE 5 MG PO TABS
15.0000 mg | ORAL_TABLET | Freq: Every day | ORAL | Status: DC
  Administered 2015-05-07 – 2015-05-11 (×5): 15 mg via ORAL
  Filled 2015-05-07 (×6): qty 1

## 2015-05-07 NOTE — Progress Notes (Signed)
Vital stable, states left sided back pain has resolved, some nausea, no vomiting, persistent hematuria, cr worsening, k 5.3, repeat labs at 2pm, place foley,  urology and nephrology consulted.

## 2015-05-07 NOTE — Consult Note (Addendum)
Renal Service Consult Note [facility]  Craig Waters. 05/07/2015 Craig Waters Requesting Physician:  Dr Craig Waters   Reason for Consult:  Acute on CKD3 HPI: The patient is a 82 y.o. year-old with hx of glaucoma, vertigo, PAF, RA, discitis, and bladder cancer. Was here last Oct with AKI from bilat hydro, rx with perc neph tubes.  F/B [facility] for bladder Ca with high-grade cancer and poor prognosis. Patient presenting with no BM x 2 days, blood in urine today (hx bladder Ca w persistent hematuria). Dysuria worse. No fever, chills, no n/v/Waters. +Lower abd pain.  UA tntc wbc and rbc.  CT results as below. Creat up to 3.0 and K 6.9. Admitted with a/c renal failure, hyperkalemia. Urology has seen and notes say he has a known bladder Ca obstructing the left ureteral orifice which has been stented and stent is in good position, stable R hydro; they're assessment was prob prerenal, if not improving w IVF consider R perc neph.    Chart review: Jul 13 - open prostatectomy Jul 13 - AMS. Hallucinations due to meds (nucynta). Dc'Waters. UTI also. CT neg. Rx abx. , improved. Mar 14 - T6-7 discitis, blood and disc cx's were negative Jan 16 - multiple aches and pains, UTI rx cipro,  Oct 16 - AKI, AMS, bilat hydro/ obstruction rx with perc neph tubes. Creat improved from 12 to 1.8 at dc. Known bladder Ca f/b [facility].  S/P resection mar '16, f/u cysto Jul 16 w persistent disease. May need stone extraction. Perc neph retained at dc.  Nov 16 - afib with RVR, HTN, bladder ca, hematuria Dec 16 - fevers , RLE DVT, UTI/ sepsis. Rx IV then po abx cipro.  IVC filter placed.  Ongoing hematuria. Bladder cancer. Per [facility] has high-grade bladder cancer, likely incurable. Hematuria to be long-term issue, ok to remove foley cath. Not candidate for cystectomy. No coum for afib Waters/t hematuria.    ROS  n/a  Past Medical History  Past Medical History  Diagnosis Date  . Blood in stool   . Diverticulitis   . GERD  (gastroesophageal reflux disease)   . Glaucoma   . Ulcer   . History of transfusion of whole blood   . Prostate disorder   . Shortness of breath 10-18-11    with exertion, cardiac cath done 7-8 yrs ago negative.  . Vertigo 10-18-11    inner ear issues occ. -tx. Bonine as needed  . Arthritis 10-18-11    back, fingers  . H/O hiatal hernia 10-18-11    noted on recent CXR  . Adenomatous colon polyp   . Urothelial carcinoma (Craig [facility]) 05/2014  . PAF (paroxysmal atrial fibrillation) (Craig Waters)   . Rheumatoid arthritis (Craig Waters)   . Discitis of thoracic region 07/04/2012   Past Surgical History  Past Surgical History  Procedure Laterality Date  . Gastric ulcer  10-18-11    '91-blleding ulcer with blood transfusions after  . Appendectomy  10-18-11    age 47  . Cataract extraction, bilateral  10-18-11    bilateral   . Eye surgery  10-18-11    laser eye surgery s/p cataract bilaterally  . Cardiac catheterization  10-18-11    7-8 yrs ago-mild blockage,no tx. indicated  . Prostatectomy  10/27/2011    Procedure: PROSTATECTOMY SUPRAPUBIC;  Surgeon: Craig Rud, MD;  Location: [facility];  Service: Urology;  Laterality: N/A;  Placement of suprapubic tube  . Cystoscopy  10/27/2011    Procedure: CYSTOSCOPY FLEXIBLE;  Surgeon: Craig Bali  Joya San, MD;  Location: [facility];  Service: Urology;  Laterality: N/A;  . Transurethral resection of bladder tumor with gyrus (turbt-gyrus)  06/2014    T1 HG tumor.  Craig [facility].   Family History  Family History  Problem Relation Age of Onset  . Breast cancer Sister   . Dementia Mother   . Heart attack Father   . Colon cancer Neg Hx    Social History  reports that he quit smoking about 36 years ago. His smoking use included Cigarettes. He has a 5 pack-year smoking history. His smokeless tobacco use includes Chew. He reports that he does not drink alcohol or use illicit drugs. Allergies No Known Allergies Home medications Prior to Admission medications   Medication Sig Start  Date End Date Taking? Authorizing Provider  acetaminophen (TYLENOL) 650 MG CR tablet Take 1,300 mg by mouth every 6 (six) hours as needed for pain.   Yes Historical Provider, MD  acetaminophen-codeine (TYLENOL #3) 300-30 MG tablet Take 1 tablet by mouth daily as needed for moderate pain.   Yes Historical Provider, MD  acidophilus (RISAQUAD) CAPS capsule Take 1 capsule by mouth daily.   Yes Historical Provider, MD  amiodarone (PACERONE) 200 MG tablet Take 1 tablet (200 mg total) by mouth 2 (two) times daily. 03/26/15  Yes Craig Poisson, MD  b complex vitamins tablet Take 1 tablet by mouth daily.   Yes Historical Provider, MD  bisacodyl (DULCOLAX) 5 MG EC tablet Take 5 mg by mouth daily as needed for moderate constipation.   Yes Historical Provider, MD  diltiazem (CARDIZEM CD) 240 MG 24 hr capsule Take 1 capsule (240 mg total) by mouth daily. 03/19/15  Yes Craig Poisson, MD  hyoscyamine (LEVSIN SL) 0.125 MG SL tablet Take 0.125 mg by mouth every 4 (four) hours as needed (bladder spasms).  02/01/15  Yes Historical Provider, MD  ondansetron (ZOFRAN) 4 MG tablet Take 1 tablet (4 mg total) by mouth every 6 (six) hours as needed for nausea. 02/22/15  Yes Craig Hazel, MD  polyethylene glycol powder (GLYCOLAX/MIRALAX) powder Take 255 g by mouth daily. Patient taking differently: Take 17 g by mouth 2 (two) times daily as needed (for constipation).  11/27/14  Yes Craig Craze, NP  predniSONE (DELTASONE) 5 MG tablet Take 15 mg by mouth daily with breakfast. .   Yes Historical Provider, MD  promethazine (PHENERGAN) 12.5 MG tablet Take 12.5 mg by mouth every 6 (six) hours as needed for nausea or vomiting.   Yes Historical Provider, MD  ciprofloxacin (CIPRO) 500 MG tablet Take 1 tablet (500 mg total) by mouth 2 (two) times daily. For 3days Patient not taking: Reported on 05/06/2015 04/09/15   Craig Polite, MD  Multiple Vitamin (MULTIVITAMIN WITH MINERALS) TABS tablet Take 1 tablet by mouth daily. Patient not  taking: Reported on 05/06/2015 05/15/14   Craig Hau, MD   Liver Function Tests  Recent Labs Lab 05/06/15 1731  AST 18  ALT 12*  ALKPHOS 108  BILITOT 0.5  PROT 7.2  ALBUMIN 3.5    Recent Labs Lab 05/06/15 1731  LIPASE 21   CBC  Recent Labs Lab 05/06/15 1731 05/07/15 0407  WBC 13.3* 14.1*  HGB 13.0 12.4*  HCT 41.3 40.0  MCV 91.2 92.6  PLT 296 062   Basic Metabolic Panel  Recent Labs Lab 05/06/15 1731 05/07/15 0407 05/07/15 1430  NA 137 136 133*  K 5.5* 5.3* 5.0  CL 101 102 103  CO2 24 21*  21*  GLUCOSE 116* 100* 113*  BUN 36* 41* 43*  CREATININE 3.01* 4.08* 4.66*  CALCIUM 9.0 8.8* 8.0*    Filed Vitals:   05/07/15 1347 05/07/15 1400 05/07/15 1607 05/07/15 1658  BP: 153/83 149/76 158/86 167/82  Pulse: 87 86 89 91  Temp:   98.8 F (37.1 C) 98.8 F (37.1 C)  TempSrc:   Oral Oral  Resp: 18 15 17 20   Height:    5' 11"  (1.803 m)  Weight:    92.987 kg (205 lb)  SpO2: 94% 95% 96% 96%   Exam Alert, no distress, calm No rash, cyanosis or gangrene Sclera anicteric, throat clear NO jvd, flat neck veins Chest clear to bases bilat RRR no MRG Abd soft ntnd no mass, no ascites, no SP bulging Normal male penis/ scrotum MS no joint effusions Ext trace pretib edema bilat, no UE edema Neuro alert, pleasant Ox 3  UA turbid, red, large Hb, largeLE/neg nitrite, 7.5, >300 prot, 1.027, epi none, TNTC rbc and wbc's  Date   Creat   eGFR  CKD 2013-14 0.73- 1.26 Apr 2014 1.08   62   Oct 2016 12.06 >> 1.85 at dc 3- 32  Stage III- V Dec 2016 1.66- 1.91  31- 37  Stage III Jan 12  3.01   12 Jan 13  4.08   11   Home meds > tylenol, tyl 3, amiodarone, cardizem CD 240, Levsin, zofran, miralax, prednisone 15 /Waters, phenergan, cipro, MVI  CT abd > Left ureteral stent in place, mild dilatation of renal pelvis and prox ureter in spite of stent. Mild R hydroureteronephrosis similar to prior , ureteral dilatation throughout its course. NO stones on R. One 71m stone  in L ureter. Constipated. Diffuse bladder wall thickening, irregularity, small L LN poss nodal disease. R hepatic lobe lesion inc'Waters in size minimally, poss metastatic disease progression.   Assessment: 1 Acute on CKD 3 - prob prerenal, vs obstruction on R side. Cont IVF's.  2 Bladder Cancer, high-grade w prob mets 3 Hyperkalemia - getting better after resin 4 BP - not on BP meds, BP's normal   Plan - cont IVF's, looks like there is room for volume  RKelly SplinterMD C[place] [phone]   cell [phone]/13/2017, 5:35 PM

## 2015-05-07 NOTE — H&P (Addendum)
[facility] History and Physical  Craig Waters. LR:[id number] DOB: [birth year] DOA: 05/06/2015  Referring physician: ED physician PCP: [name], MD  Specialists:   Chief Complaint: Hematuria, dysuria, constipation and scrotum pain  HPI: Craig Waters. is a 82 y.o. male with PMH of urothelial cancer (posterior status of surgery), GI bleeding, PUD, diverticulitis, vertigo, PAF not on anticoagulants, rheumatoid arthritis, history of discitis of thoracic spine, chronic kidney disease-3, DVT not on anticoagulants due to hematuria, who presents with hematuria, dysuria and constipation.  Patient reports that she has a chronic intermittent hematuria after he had a surgery for urothelial cancer, which has worsened today. He also has a dysuria, burning on urination and increased urinary frequency. He does not have fever, chills. He has nausea, but no vomiting or diarrhea. He also has some mild lower abdominal pain. He denies chest pain, shortness of breath, cough, unilateral weakness, bloody stool. Patient also reports having scrotum pain for 2 weeks.  In ED, patient was found to have positive urinalysis for UTI, WBC 13.3, lipase 21, potassium of 5.5 without EKG changes, worsening renal function. Scrotal ultrasound showed normal sonographic appearance of the testicles, small left varicocele,tiny left epididymal head cyst. Patient admitted to inpatient for further eval and treatment. Urology, Dr. Ottis Stain was consulted by EDP.  CT abdomen/pelvis that showed no bowel obstruction,  low moderate stool burden in the right and transverse colon, with decompressed distal colon. Equivocal wall thickening of the distal sigmoid colon, may reflect minimal colitis in the appropriate clinical setting. Left ureteral stent with minimal residual hydronephrosis and proximal ureter. 5 mm stone in the distal left ureter adjacent to the stent is seen. Unchanged right hydroureteronephrosis. Diffuse bladder wall  thickening and irregularity, with minimal increased thin perivesicular inflammation from prior. Small left pelvic lymph node is unchanged, concerning for nodal disease. Right lobe hepatic lesion has minimally increased in size in the interim, concerning for progression of metastatic disease.  EKG: Independently reviewed. QTC 433, without T-wave peaking.  Where does patient live?   At home    Can patient participate in ADLs? Barely  Review of Systems:   General: no fevers, chills, no changes in body weight, has poor appetite, has fatigue HEENT: no blurry vision, hearing changes or sore throat Pulm: no dyspnea, coughing, wheezing CV: no chest pain, palpitations Abd: has nausea, no vomiting, has abdominal pain, no diarrhea, constipation GU: has dysuria, burning on urination, increased urinary frequency, hematuria  Ext: no leg edema Neuro: no unilateral weakness, numbness, or tingling, no vision change or hearing loss Skin: no rash MSK: No muscle spasm, no deformity, no limitation of range of movement in spin Heme: No easy bruising.  Travel history: No recent long distant travel.  Allergy: No Known Allergies  Past Medical History  Diagnosis Date  . Blood in stool   . Diverticulitis   . GERD (gastroesophageal reflux disease)   . Glaucoma   . Ulcer   . History of transfusion of whole blood   . Prostate disorder   . Shortness of breath 10-18-11    with exertion, cardiac cath done 7-8 yrs ago negative.  . Vertigo 10-18-11    inner ear issues occ. -tx. Bonine as needed  . Arthritis 10-18-11    back, fingers  . H/O hiatal hernia 10-18-11    noted on recent CXR  . Adenomatous colon polyp   . Urothelial carcinoma ([place]) 05/2014  . PAF (paroxysmal atrial fibrillation) ([place])   . Rheumatoid arthritis ([place])   .  Discitis of thoracic region 07/04/2012    Past Surgical History  Procedure Laterality Date  . Gastric ulcer  10-18-11    '91-blleding ulcer with blood transfusions after  .  Appendectomy  10-18-11    age 71  . Cataract extraction, bilateral  10-18-11    bilateral   . Eye surgery  10-18-11    laser eye surgery s/p cataract bilaterally  . Cardiac catheterization  10-18-11    7-8 yrs ago-mild blockage,no tx. indicated  . Prostatectomy  10/27/2011    Procedure: PROSTATECTOMY SUPRAPUBIC;  Surgeon: Ailene Rud, MD;  Location: [facility];  Service: Urology;  Laterality: N/A;  Placement of suprapubic tube  . Cystoscopy  10/27/2011    Procedure: CYSTOSCOPY FLEXIBLE;  Surgeon: Ailene Rud, MD;  Location: [facility];  Service: Urology;  Laterality: N/A;  . Transurethral resection of bladder tumor with gyrus (turbt-gyrus)  06/2014    T1 HG tumor.  [facility].    Social History:  reports that he quit smoking about 36 years ago. His smoking use included Cigarettes. He has a 5 pack-year smoking history. His smokeless tobacco use includes Chew. He reports that he does not drink alcohol or use illicit drugs.  Family History:  Family History  Problem Relation Age of Onset  . Breast cancer Sister   . Dementia Mother   . Heart attack Father   . Colon cancer Neg Hx      Prior to Admission medications   Medication Sig Start Date End Date Taking? Authorizing Provider  acetaminophen (TYLENOL) 650 MG CR tablet Take 1,300 mg by mouth every 6 (six) hours as needed for pain.   Yes Historical Provider, MD  acetaminophen-codeine (TYLENOL #3) 300-30 MG tablet Take 1 tablet by mouth daily as needed for moderate pain.   Yes Historical Provider, MD  acidophilus (RISAQUAD) CAPS capsule Take 1 capsule by mouth daily.   Yes Historical Provider, MD  amiodarone (PACERONE) 200 MG tablet Take 1 tablet (200 mg total) by mouth 2 (two) times daily. 03/26/15  Yes Hosie Poisson, MD  b complex vitamins tablet Take 1 tablet by mouth daily.   Yes Historical Provider, MD  bisacodyl (DULCOLAX) 5 MG EC tablet Take 5 mg by mouth daily as needed for moderate constipation.   Yes Historical Provider, MD   diltiazem (CARDIZEM CD) 240 MG 24 hr capsule Take 1 capsule (240 mg total) by mouth daily. 03/19/15  Yes Hosie Poisson, MD  hyoscyamine (LEVSIN SL) 0.125 MG SL tablet Take 0.125 mg by mouth every 4 (four) hours as needed (bladder spasms).  02/01/15  Yes Historical Provider, MD  ondansetron (ZOFRAN) 4 MG tablet Take 1 tablet (4 mg total) by mouth every 6 (six) hours as needed for nausea. 02/22/15  Yes Donne Hazel, MD  polyethylene glycol powder (GLYCOLAX/MIRALAX) powder Take 255 g by mouth daily. Patient taking differently: Take 17 g by mouth 2 (two) times daily as needed (for constipation).  11/27/14  Yes Willia Craze, NP  predniSONE (DELTASONE) 5 MG tablet Take 15 mg by mouth daily with breakfast. .   Yes Historical Provider, MD  promethazine (PHENERGAN) 12.5 MG tablet Take 12.5 mg by mouth every 6 (six) hours as needed for nausea or vomiting.   Yes Historical Provider, MD  ciprofloxacin (CIPRO) 500 MG tablet Take 1 tablet (500 mg total) by mouth 2 (two) times daily. For 3days Patient not taking: Reported on 05/06/2015 04/09/15   Domenic Polite, MD  Multiple Vitamin (MULTIVITAMIN WITH MINERALS) TABS  tablet Take 1 tablet by mouth daily. Patient not taking: Reported on 05/06/2015 05/15/14   Erline Hau, MD    Physical Exam: Filed Vitals:   05/06/15 2324 05/07/15 0221 05/07/15 0438 05/07/15 0445  BP: 190/93 155/87 173/94 173/94  Pulse: 85 93  94  Temp:      TempSrc:      Resp: 18 18 19 19   Height:      Weight:      SpO2: 96% 96% 95% 95%   General: Not in acute distress HEENT:       Eyes: PERRL, EOMI, no scleral icterus.       ENT: No discharge from the ears and nose, no pharynx injection, no tonsillar enlargement.        Neck: No JVD, no bruit, no mass felt. Heme: No neck lymph node enlargement. Cardiac: S1/S2, RRR, No murmurs, No gallops or rubs. Pulm: No rales, wheezing, rhonchi or rubs. Abd: Mildly distended, nontender, no rebound pain, no organomegaly, BS  present. Ext: No pitting leg edema bilaterally. 2+DP/PT pulse bilaterally. Musculoskeletal: No joint deformities, No joint redness or warmth, no limitation of ROM in spin. Skin: No rashes.  Neuro: Alert, oriented X3, cranial nerves II-XII grossly intact, moves all extremities  Psych: Patient is not psychotic, no suicidal or hemocidal ideation.  Labs on Admission:  Basic Metabolic Panel:  Recent Labs Lab 05/06/15 1731  NA 137  K 5.5*  CL 101  CO2 24  GLUCOSE 116*  BUN 36*  CREATININE 3.01*  CALCIUM 9.0   Liver Function Tests:  Recent Labs Lab 05/06/15 1731  AST 18  ALT 12*  ALKPHOS 108  BILITOT 0.5  PROT 7.2  ALBUMIN 3.5    Recent Labs Lab 05/06/15 1731  LIPASE 21   No results for input(s): AMMONIA in the last 168 hours. CBC:  Recent Labs Lab 05/06/15 1731 05/07/15 0407  WBC 13.3* 14.1*  HGB 13.0 12.4*  HCT 41.3 40.0  MCV 91.2 92.6  PLT 296 309   Cardiac Enzymes: No results for input(s): CKTOTAL, CKMB, CKMBINDEX, TROPONINI in the last 168 hours.  BNP (last 3 results)  Recent Labs  02/15/15 1206 03/14/15 1220  BNP 522.9* 137.0*    ProBNP (last 3 results) No results for input(s): PROBNP in the last 8760 hours.  CBG: No results for input(s): GLUCAP in the last 168 hours.  Radiological Exams on Admission: Ct Abdomen Pelvis Wo Contrast  05/07/2015  CLINICAL DATA:  Stated history of small bowel obstruction. Patient reports no bowel movement for 2 days, has not passed gas today. Hematuria. EXAM: CT ABDOMEN AND PELVIS WITHOUT CONTRAST TECHNIQUE: Multidetector CT imaging of the abdomen and pelvis was performed following the standard protocol without IV contrast. COMPARISON:  CT 03/14/2015 FINDINGS: Lower chest: Elevation of right hemidiaphragm with adjacent compressive atelectasis in the right middle lobe. Coronary artery calcifications are seen. Liver: Hypodense subcapsular liver lesion involving the right lobe with likely increased in size from prior  exam, currently 5.4 cm, previously 4.2 cm. Detailed hepatic evaluation limited secondary to lack of intravenous contrast. Hepatobiliary: Gallbladder physiologically distended, no calcified stone. No biliary dilatation. Pancreas: Fatty atrophy, no ductal dilatation. Spleen: Unremarkable noncontrast appearance. Adrenal glands: No nodule. Kidneys: Left ureteral stent in place. 77mm stone is seen adjacent to the stent in the left distal ureter, similar in position to prior. The previous left nephrostomy tube is no longer seen. There is mild dilatation of the renal pelvis and proximal ureter despite stent placement. Mild  right hydroureteronephrosis is similar to prior, with ureteral dilatation throughout its course hypodense lesions within the right kidney, likely cysts. Stomach/Bowel: Stomach mildly distended with ingested contrast. There are no dilated or thickened small bowel loops. Small bowel loops are decompressed. Moderate stool in the right and transverse colon, small volume of stool in the descending and rectosigmoid colon. Equivocal colonic wall thickening involving the distal sigmoid. Appendix is not seen, surgically absent. Vascular/Lymphatic: Small retroperitoneal lymph nodes, not enlarged by size criteria. Left pelvic sidewall node, 10 mm short axis. Abdominal aorta is normal in caliber. Moderate atherosclerosis without aneurysm. An IVC filter is in place. Reproductive: Post prostatectomy per report. Bladder: Marked wall irregularity and thickening. Minimal urine in the bladder lumen. Perivesicular inflammatory change, mildly increased in the interim. Other: No free air, free fluid, or intra-abdominal fluid collection. Fat within both inguinal canals. Musculoskeletal: There are no acute or suspicious osseous abnormalities. Stable degenerative change in the spine. Anterolisthesis of L3 on L4 with associated degenerative change. No destructive lytic or blastic osseous lesion. IMPRESSION: 1. No bowel  obstruction. Low moderate stool burden in the right and transverse colon, with decompressed distal colon. Equivocal wall thickening of the distal sigmoid colon, may reflect minimal colitis in the appropriate clinical setting. 2. Left ureteral stent with minimal residual hydronephrosis and proximal ureter. 5 mm stone in the distal left ureter adjacent to the stent is seen. Unchanged right hydroureteronephrosis. 3. Diffuse bladder wall thickening and irregularity, with minimal increased thin perivesicular inflammation from prior. Small left pelvic lymph node is unchanged, concerning for nodal disease. 4. Right lobe hepatic lesion has minimally increased in size in the interim, concerning for progression of metastatic disease. Electronically Signed   By: Jeb Levering M.D.   On: 05/07/2015 01:24   US Scrotum  05/07/2015  CLINICAL DATA:  Bilateral testicular tenderness.  Chronic pain. EXAM: SCROTAL ULTRASOUND DOPPLER ULTRASOUND OF THE TESTICLES TECHNIQUE: Complete ultrasound examination of the testicles, epididymis, and other scrotal structures was performed. Color and spectral Doppler ultrasound were also utilized to evaluate blood flow to the testicles. COMPARISON:  None. FINDINGS: Right testicle Measurements: 4.4 x 2.6 x 2.7 cm. No mass or microlithiasis visualized. Left testicle Measurements: 3.8 x 2.7 x 2.8 cm. No mass or microlithiasis visualized. Right epididymis:  Normal in size and appearance. Left epididymis: Small cyst in the epididymal tail measuring 3.4 mm. Normal in size and appearance. Hydrocele:  None visualized. Varicocele:  Mild on the left. Pulsed Doppler interrogation of both testes demonstrates normal low resistance arterial and venous waveforms bilaterally. IMPRESSION: 1. Normal sonographic appearance of the testicles. 2. Small left varicocele.  Tiny left epididymal head cyst. Electronically Signed   By: Jeb Levering M.D.   On: 05/07/2015 02:01   [place] Art/ven Flow Abd Pelv  Doppler  05/07/2015  CLINICAL DATA:  Bilateral testicular tenderness.  Chronic pain. EXAM: SCROTAL ULTRASOUND DOPPLER ULTRASOUND OF THE TESTICLES TECHNIQUE: Complete ultrasound examination of the testicles, epididymis, and other scrotal structures was performed. Color and spectral Doppler ultrasound were also utilized to evaluate blood flow to the testicles. COMPARISON:  None. FINDINGS: Right testicle Measurements: 4.4 x 2.6 x 2.7 cm. No mass or microlithiasis visualized. Left testicle Measurements: 3.8 x 2.7 x 2.8 cm. No mass or microlithiasis visualized. Right epididymis:  Normal in size and appearance. Left epididymis: Small cyst in the epididymal tail measuring 3.4 mm. Normal in size and appearance. Hydrocele:  None visualized. Varicocele:  Mild on the left. Pulsed Doppler interrogation of both testes demonstrates normal  low resistance arterial and venous waveforms bilaterally. IMPRESSION: 1. Normal sonographic appearance of the testicles. 2. Small left varicocele.  Tiny left epididymal head cyst. Electronically Signed   By: Jeb Levering M.D.   On: 05/07/2015 02:01    Assessment/Plan Principal Problem:   UTI (lower urinary tract infection) Active Problems:   BPH (benign prostatic hyperplasia)   PUD (peptic ulcer disease)   GERD (gastroesophageal reflux disease)   HTN (hypertension)   Constipation   Urothelial carcinoma (HCC)   Rheumatoid arthritis (HCC)   Hyperkalemia   Bilateral hydronephrosis   Left nephrolithiasis   Acute renal failure superimposed on stage 3 chronic kidney disease (HCC)   Hematuria   PAF (paroxysmal atrial fibrillation) (HCC)   Scrotum pain   UTI (lower urinary tract infection): UA is positive and pt is symptomatically. Patient is not septic on admission, pending lactic acid level. Hemodynamically stable.  - Admit to telemetry - Ceftriaxone by IV - Follow up results of urine and blood cx and amend antibiotic regimen if needed per sensitivity results - prn  Zofran for nausea - will get Procalcitonin and trend lactic acid levels - IVF: 1.5L of NS bolus in ED, followed by 100 cc/h   History of recent DVT: Patient is status post IVC filter placement. Patient is not anticoagulated secondary to ongoing hematuria. No signs of pulmonary embolism -Observe respiratory function closely  History of bladder cancer/hematuria: Status post resection of the tumor at [facility]. Per discharge summary, his urologist said that pt would have hematuria since he has high grade invasive bladder CA, likely incurable. Per his urologist, pt is not a candidate for cystectomy  Atrial Fibrillation: CHA2DS2-VASc Score is 3, needs oral anticoagulation, but patient is no on AC at home due to hematuria. Heart rate is well controlled. -continue amiodarone, Cardizem.  Constipation; -Fleet enema x 1 -MiraLAX, Senecal  AoCKD-III: Baseline Cre is 1.66, his Cre 3.01 and BUN 36 is on admission. Likely due to UTI and prerenal secondary to dehydration. He has left ureteral stent with minimal residual hydronephrosis and proximal ureter. 5 mm stone in the distal left ureter adjacent to the stent. Unchanged right hydroureteronephrosis. EDP discussed with urologist, Dr. Ottis Stain, who did not think patient needs urgent stent placement. - IVF as above - check FeNa - Follow up renal function by BMP - May follow up with his urologist in [facility].  GERD: -Protonix  RA: stable -continue home prednisone. No signs of AI.  HTN (hypertension): Bp 190/93 -Continue Cardizem -IV hydralazine when necessary  Hyperkalemia: Potassium 5.5 without EKG change. This is due to worsening renal function. -Kayexalate 30 g 1 -IV fluids as above  Scrotum pain: Likely due to UTI and urothelial cancer. Scrotum US is negative. -Symptomatic treatment with pain medication  DVT ppx: none (patient has hematuria, cannot use heparin. Patient has DVT, cannot use SCD)  Code Status: Full code Family  Communication: Yes, patient's daguter at bed side Disposition Plan: Admit to inpatient   Date of Service 05/07/2015    Ivor Costa [facility] Pager [phone]  If 7PM-7AM, please contact night-coverage [email or web address] Password [email or web address] 05/07/2015, 6:00 AM

## 2015-05-07 NOTE — ED Notes (Signed)
Pt delay due to nurse having septic patient.

## 2015-05-07 NOTE — ED Notes (Signed)
PT HAD LARGE BM

## 2015-05-07 NOTE — Consult Note (Signed)
Urology Consult   Physician requesting consult: Ivor Costa, MD  Reason for consult: hematuria, dysuria, scrotal pain  History of Present Illness: Craig Waters. is a 82 y.o. who we are seeing in consultation for gross hematuria, dysuria and scrotal pain. He has a history of suprapubic radical prostatectomy, atrial fibrillation, CKD, DVT and newly diagnosed HGT2 MIBC.   He is a known patient of Dr. 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 He has a LEFT stent in place placed 03/29/16 by Dr. [name]. Prior to this he had been managed with a LEFT PCN. On 03/29/16 he wont to the operating room with Dr. [name] and underwent TURBT of large bladder tumor obscuring the trigone. LEFT RPG noted mild dilatation at the level of the UPJ, of note he does have a known 5 mm LEFT distal ureteral stone. Pathology revealed HG T2 disease and he was determined not to be a cystectomy candidate given his other medical issues. Plan of care was to proceed with a bladder sparing protocol. He has scheduled follow-up in 4 months with office cystoscopy.  He presents to the [facility] ED with worsening of his chronic intermittent hematuria, dysuria and irritative voiding symptoms in addition to mild lower abdominal pain. He also reports scrotal pain x 2 weeks. Urinalysis was c/w stent (nitrite negative, few bacteria, large LE). CT scan of the abdomen/pelvis was obtained which demonstrated LEFT ureteral stent in position with minimal hydronephrosis and 5 mm stone in the distal LEFT ureter in addition to unchanged RIGHT hydronephrosis. He was also noted to have an enlarged small LEFT pelvic lymph node which was new compared to prior imaging. Scrotal US was negative.  Currently voiding volitionally with low PVR.  Past Medical History  Diagnosis Date  . Blood in stool   . Diverticulitis    . GERD (gastroesophageal reflux disease)   . Glaucoma   . Ulcer   . History of transfusion of whole blood   . Prostate disorder   . Shortness of breath 10-18-11    with exertion, cardiac cath done 7-8 yrs ago negative.  . Vertigo 10-18-11    inner ear issues occ. -tx. Bonine as needed  . Arthritis 10-18-11    back, fingers  . H/O hiatal hernia 10-18-11    noted on recent CXR  . Adenomatous colon polyp   . Urothelial carcinoma ([place]) 05/2014  . PAF (paroxysmal atrial fibrillation) ([place])   . Rheumatoid arthritis ([place])   . Discitis of thoracic region 07/04/2012    Past Surgical History  Procedure Laterality Date  . Gastric ulcer  10-18-11    '91-blleding ulcer with blood transfusions after  . Appendectomy  10-18-11    age 55  . Cataract extraction, bilateral  10-18-11    bilateral   . Eye surgery  10-18-11    laser eye surgery s/p cataract bilaterally  . Cardiac catheterization  10-18-11    7-8 yrs ago-mild blockage,no tx. indicated  . Prostatectomy  10/27/2011    Procedure: PROSTATECTOMY SUPRAPUBIC;  Surgeon: Ailene Rud, MD;  Location: [facility];  Service: Urology;  Laterality: N/A;  Placement of suprapubic tube  . Cystoscopy  10/27/2011    Procedure: CYSTOSCOPY FLEXIBLE;  Surgeon: Ailene Rud, MD;  Location: [facility];  Service: Urology;  Laterality: N/A;  . Transurethral resection of bladder tumor with gyrus (turbt-gyrus)  06/2014    T1 HG  tumor.  [facility] Medications:  Home Meds:    Medication List    ASK your doctor about these medications        acetaminophen 650 MG CR tablet  Commonly known as:  TYLENOL  Take 1,300 mg by mouth every 6 (six) hours as needed for pain.     acetaminophen-codeine 300-30 MG tablet  Commonly known as:  TYLENOL #3  Take 1 tablet by mouth daily as needed for moderate pain.     acidophilus Caps capsule  Take 1 capsule by mouth daily.     amiodarone 200 MG tablet  Commonly known as:  PACERONE  Take 1 tablet  (200 mg total) by mouth 2 (two) times daily.     b complex vitamins tablet  Take 1 tablet by mouth daily.     bisacodyl 5 MG EC tablet  Commonly known as:  DULCOLAX  Take 5 mg by mouth daily as needed for moderate constipation.     ciprofloxacin 500 MG tablet  Commonly known as:  CIPRO  Take 1 tablet (500 mg total) by mouth 2 (two) times daily. For 3days     diltiazem 240 MG 24 hr capsule  Commonly known as:  CARDIZEM CD  Take 1 capsule (240 mg total) by mouth daily.     hyoscyamine 0.125 MG SL tablet  Commonly known as:  LEVSIN SL  Take 0.125 mg by mouth every 4 (four) hours as needed (bladder spasms).     multivitamin with minerals Tabs tablet  Take 1 tablet by mouth daily.     ondansetron 4 MG tablet  Commonly known as:  ZOFRAN  Take 1 tablet (4 mg total) by mouth every 6 (six) hours as needed for nausea.     polyethylene glycol powder powder  Commonly known as:  GLYCOLAX/MIRALAX  Take 255 g by mouth daily.     predniSONE 5 MG tablet  Commonly known as:  DELTASONE  Take 15 mg by mouth daily with breakfast. .     promethazine 12.5 MG tablet  Commonly known as:  PHENERGAN  Take 12.5 mg by mouth every 6 (six) hours as needed for nausea or vomiting.        Scheduled Meds: . acidophilus  1 capsule Oral Daily  . amiodarone  200 mg Oral BID  . B-complex with vitamin C  1 tablet Oral Daily  . diltiazem  240 mg Oral Daily  . pantoprazole (PROTONIX) IV  40 mg Intravenous Q24H  . predniSONE  15 mg Oral Q breakfast  . senna-docusate  1 tablet Oral BID  . sodium chloride  3 mL Intravenous Q12H   Continuous Infusions: . sodium chloride    . cefTRIAXone (ROCEPHIN)  IV Stopped (05/07/15 0317)   PRN Meds:.acetaminophen **OR** acetaminophen, bisacodyl, hydrALAZINE, hyoscyamine, ondansetron **OR** ondansetron (ZOFRAN) IV, polyethylene glycol  Allergies: No Known Allergies  Family History  Problem Relation Age of Onset  . Breast cancer Sister   . Dementia Mother   .  Heart attack Father   . Colon cancer Neg Hx     Social History:  reports that he quit smoking about 36 years ago. His smoking use included Cigarettes. He has a 5 pack-year smoking history. His smokeless tobacco use includes Chew. He reports that he does not drink alcohol or use illicit drugs.  ROS: A complete review of systems was performed.  All systems are negative except for pertinent findings as noted.  Physical Exam:  Vital  signs in last 24 hours: Temp:  [98.3 F (36.8 C)] 98.3 F (36.8 C) (01/12 1839) Pulse Rate:  [78-96] 96 (01/13 0621) Resp:  [18-19] 19 (01/13 0621) BP: (149-190)/(80-94) 151/83 mmHg (01/13 0621) SpO2:  [95 %-96 %] 95 % (01/13 0621) Weight:  [92.987 kg (205 lb)] 92.987 kg (205 lb) (01/12 1454)  Constitutional:  Alert and oriented, No acute distress Cardiovascular: Regular rate and rhythm, No JVD Respiratory: Normal respiratory effort, Lungs clear bilaterally GI: Abdomen is soft, nontender, nondistended, no abdominal masses GU: No CVA tenderness, scrotum TTP, no mass, abscess, fluctuance, dried blood at urethral meatus, otherwise normal phallus Lymphatic: No lymphadenopathy Neurologic: Grossly intact, no focal deficits Psychiatric: Normal mood and affect EXT: 1 + pitting edema of BLE  Laboratory Data:   Recent Labs  05/06/15 1731 05/07/15 0407  WBC 13.3* 14.1*  HGB 13.0 12.4*  HCT 41.3 40.0  PLT 296 309     Recent Labs  05/06/15 1731 05/07/15 0407  NA 137 136  K 5.5* 5.3*  CL 101 102  GLUCOSE 116* 100*  BUN 36* 41*  CALCIUM 9.0 8.8*  CREATININE 3.01* 4.08*     Results for orders placed or performed during the hospital encounter of 05/06/15 (from the past 24 hour(s))  Lipase, blood     Status: None   Collection Time: 05/06/15  5:31 PM  Result Value Ref Range   Lipase 21 11 - 51 U/L  Comprehensive metabolic panel     Status: Abnormal   Collection Time: 05/06/15  5:31 PM  Result Value Ref Range   Sodium 137 135 - 145 mmol/L    Potassium 5.5 (H) 3.5 - 5.1 mmol/L   Chloride 101 101 - 111 mmol/L   CO2 24 22 - 32 mmol/L   Glucose, Bld 116 (H) 65 - 99 mg/dL   BUN 36 (H) 6 - 20 mg/dL   Creatinine, Ser 3.01 (H) 0.61 - 1.24 mg/dL   Calcium 9.0 8.9 - 10.3 mg/dL   Total Protein 7.2 6.5 - 8.1 g/dL   Albumin 3.5 3.5 - 5.0 g/dL   AST 18 15 - 41 U/L   ALT 12 (L) 17 - 63 U/L   Alkaline Phosphatase 108 38 - 126 U/L   Total Bilirubin 0.5 0.3 - 1.2 mg/dL   GFR calc non Af Amer 18 (L) >60 mL/min   GFR calc Af Amer 21 (L) >60 mL/min   Anion gap 12 5 - 15  CBC     Status: Abnormal   Collection Time: 05/06/15  5:31 PM  Result Value Ref Range   WBC 13.3 (H) 4.0 - 10.5 K/uL   RBC 4.53 4.22 - 5.81 MIL/uL   Hemoglobin 13.0 13.0 - 17.0 g/dL   HCT 41.3 39.0 - 52.0 %   MCV 91.2 78.0 - 100.0 fL   MCH 28.7 26.0 - 34.0 pg   MCHC 31.5 30.0 - 36.0 g/dL   RDW 14.6 11.5 - 15.5 %   Platelets 296 150 - 400 K/uL  Urinalysis, Routine w reflex microscopic (not at [facility])     Status: Abnormal   Collection Time: 05/06/15 10:58 PM  Result Value Ref Range   Color, Urine RED (A) YELLOW   APPearance TURBID (A) CLEAR   Specific Gravity, Urine 1.027 1.005 - 1.030   pH 7.5 5.0 - 8.0   Glucose, UA NEGATIVE NEGATIVE mg/dL   Hgb urine dipstick LARGE (A) NEGATIVE   Bilirubin Urine NEGATIVE NEGATIVE   Ketones, ur NEGATIVE NEGATIVE mg/dL  Protein, ur >300 (A) NEGATIVE mg/dL   Nitrite NEGATIVE NEGATIVE   Leukocytes, UA LARGE (A) NEGATIVE  Urine microscopic-add on     Status: Abnormal   Collection Time: 05/06/15 10:58 PM  Result Value Ref Range   Squamous Epithelial / LPF NONE SEEN NONE SEEN   WBC, UA TOO NUMEROUS TO COUNT 0 - 5 WBC/hpf   RBC / HPF TOO NUMEROUS TO COUNT 0 - 5 RBC/hpf   Bacteria, UA FEW (A) NONE SEEN   Urine-Other URINALYSIS PERFORMED ON SUPERNATANT   Lactic acid, plasma     Status: None   Collection Time: 05/07/15  4:06 AM  Result Value Ref Range   Lactic Acid, Venous 1.3 0.5 - 2.0 mmol/L  Procalcitonin     Status: None    Collection Time: 05/07/15  4:06 AM  Result Value Ref Range   Procalcitonin 0.73 ng/mL  Protime-INR     Status: None   Collection Time: 05/07/15  4:06 AM  Result Value Ref Range   Prothrombin Time 13.4 11.6 - 15.2 seconds   INR 1.00 0.00 - 1.49  APTT     Status: None   Collection Time: 05/07/15  4:06 AM  Result Value Ref Range   aPTT 36 24 - 37 seconds  CBC     Status: Abnormal   Collection Time: 05/07/15  4:07 AM  Result Value Ref Range   WBC 14.1 (H) 4.0 - 10.5 K/uL   RBC 4.32 4.22 - 5.81 MIL/uL   Hemoglobin 12.4 (L) 13.0 - 17.0 g/dL   HCT 40.0 39.0 - 52.0 %   MCV 92.6 78.0 - 100.0 fL   MCH 28.7 26.0 - 34.0 pg   MCHC 31.0 30.0 - 36.0 g/dL   RDW 14.7 11.5 - 15.5 %   Platelets 309 150 - 400 K/uL  Basic metabolic panel     Status: Abnormal   Collection Time: 05/07/15  4:07 AM  Result Value Ref Range   Sodium 136 135 - 145 mmol/L   Potassium 5.3 (H) 3.5 - 5.1 mmol/L   Chloride 102 101 - 111 mmol/L   CO2 21 (L) 22 - 32 mmol/L   Glucose, Bld 100 (H) 65 - 99 mg/dL   BUN 41 (H) 6 - 20 mg/dL   Creatinine, Ser 4.08 (H) 0.61 - 1.24 mg/dL   Calcium 8.8 (L) 8.9 - 10.3 mg/dL   GFR calc non Af Amer 12 (L) >60 mL/min   GFR calc Af Amer 14 (L) >60 mL/min   Anion gap 13 5 - 15  CBG monitoring, ED     Status: Abnormal   Collection Time: 05/07/15  7:56 AM  Result Value Ref Range   Glucose-Capillary 106 (H) 65 - 99 mg/dL   No results found for this or any previous visit (from the past 240 hour(s)).  Renal Function:  Recent Labs  05/06/15 1731 05/07/15 0407  CREATININE 3.01* 4.08*   Estimated Creatinine Clearance: 16.3 mL/min (by C-G formula based on Cr of 4.08).  Radiologic Imaging: Ct Abdomen Pelvis Wo Contrast  05/07/2015  CLINICAL DATA:  Stated history of small bowel obstruction. Patient reports no bowel movement for 2 days, has not passed gas today. Hematuria. EXAM: CT ABDOMEN AND PELVIS WITHOUT CONTRAST TECHNIQUE: Multidetector CT imaging of the abdomen and pelvis was  performed following the standard protocol without IV contrast. COMPARISON:  CT 03/14/2015 FINDINGS: Lower chest: Elevation of right hemidiaphragm with adjacent compressive atelectasis in the right middle lobe. Coronary artery calcifications are seen. Liver:  Hypodense subcapsular liver lesion involving the right lobe with likely increased in size from prior exam, currently 5.4 cm, previously 4.2 cm. Detailed hepatic evaluation limited secondary to lack of intravenous contrast. Hepatobiliary: Gallbladder physiologically distended, no calcified stone. No biliary dilatation. Pancreas: Fatty atrophy, no ductal dilatation. Spleen: Unremarkable noncontrast appearance. Adrenal glands: No nodule. Kidneys: Left ureteral stent in place. 92mm stone is seen adjacent to the stent in the left distal ureter, similar in position to prior. The previous left nephrostomy tube is no longer seen. There is mild dilatation of the renal pelvis and proximal ureter despite stent placement. Mild right hydroureteronephrosis is similar to prior, with ureteral dilatation throughout its course hypodense lesions within the right kidney, likely cysts. Stomach/Bowel: Stomach mildly distended with ingested contrast. There are no dilated or thickened small bowel loops. Small bowel loops are decompressed. Moderate stool in the right and transverse colon, small volume of stool in the descending and rectosigmoid colon. Equivocal colonic wall thickening involving the distal sigmoid. Appendix is not seen, surgically absent. Vascular/Lymphatic: Small retroperitoneal lymph nodes, not enlarged by size criteria. Left pelvic sidewall node, 10 mm short axis. Abdominal aorta is normal in caliber. Moderate atherosclerosis without aneurysm. An IVC filter is in place. Reproductive: Post prostatectomy per report. Bladder: Marked wall irregularity and thickening. Minimal urine in the bladder lumen. Perivesicular inflammatory change, mildly increased in the interim.  Other: No free air, free fluid, or intra-abdominal fluid collection. Fat within both inguinal canals. Musculoskeletal: There are no acute or suspicious osseous abnormalities. Stable degenerative change in the spine. Anterolisthesis of L3 on L4 with associated degenerative change. No destructive lytic or blastic osseous lesion. IMPRESSION: 1. No bowel obstruction. Low moderate stool burden in the right and transverse colon, with decompressed distal colon. Equivocal wall thickening of the distal sigmoid colon, may reflect minimal colitis in the appropriate clinical setting. 2. Left ureteral stent with minimal residual hydronephrosis and proximal ureter. 5 mm stone in the distal left ureter adjacent to the stent is seen. Unchanged right hydroureteronephrosis. 3. Diffuse bladder wall thickening and irregularity, with minimal increased thin perivesicular inflammation from prior. Small left pelvic lymph node is unchanged, concerning for nodal disease. 4. Right lobe hepatic lesion has minimally increased in size in the interim, concerning for progression of metastatic disease. Electronically Signed   By: Jeb Levering M.D.   On: 05/07/2015 01:24   US Scrotum  05/07/2015  CLINICAL DATA:  Bilateral testicular tenderness.  Chronic pain. EXAM: SCROTAL ULTRASOUND DOPPLER ULTRASOUND OF THE TESTICLES TECHNIQUE: Complete ultrasound examination of the testicles, epididymis, and other scrotal structures was performed. Color and spectral Doppler ultrasound were also utilized to evaluate blood flow to the testicles. COMPARISON:  None. FINDINGS: Right testicle Measurements: 4.4 x 2.6 x 2.7 cm. No mass or microlithiasis visualized. Left testicle Measurements: 3.8 x 2.7 x 2.8 cm. No mass or microlithiasis visualized. Right epididymis:  Normal in size and appearance. Left epididymis: Small cyst in the epididymal tail measuring 3.4 mm. Normal in size and appearance. Hydrocele:  None visualized. Varicocele:  Mild on the left. Pulsed  Doppler interrogation of both testes demonstrates normal low resistance arterial and venous waveforms bilaterally. IMPRESSION: 1. Normal sonographic appearance of the testicles. 2. Small left varicocele.  Tiny left epididymal head cyst. Electronically Signed   By: Jeb Levering M.D.   On: 05/07/2015 02:01   [place] Art/ven Flow Abd Pelv Doppler  05/07/2015  CLINICAL DATA:  Bilateral testicular tenderness.  Chronic pain. EXAM: SCROTAL ULTRASOUND DOPPLER ULTRASOUND OF THE  TESTICLES TECHNIQUE: Complete ultrasound examination of the testicles, epididymis, and other scrotal structures was performed. Color and spectral Doppler ultrasound were also utilized to evaluate blood flow to the testicles. COMPARISON:  None. FINDINGS: Right testicle Measurements: 4.4 x 2.6 x 2.7 cm. No mass or microlithiasis visualized. Left testicle Measurements: 3.8 x 2.7 x 2.8 cm. No mass or microlithiasis visualized. Right epididymis:  Normal in size and appearance. Left epididymis: Small cyst in the epididymal tail measuring 3.4 mm. Normal in size and appearance. Hydrocele:  None visualized. Varicocele:  Mild on the left. Pulsed Doppler interrogation of both testes demonstrates normal low resistance arterial and venous waveforms bilaterally. IMPRESSION: 1. Normal sonographic appearance of the testicles. 2. Small left varicocele.  Tiny left epididymal head cyst. Electronically Signed   By: Jeb Levering M.D.   On: 05/07/2015 02:01    I independently reviewed the above imaging studies.  Impression/Recommendation  84yM with HG MIBC on bladder sparing protocol (has not received chemo or radiation at this time) who presents with dysuria and acute on chronic CKD with creatinine of 4.08 from baseline of 1.66 (likely pre-renal from dehydration). LEFT stent in good position, RIGHT hydronephrosis is mild and stable. Voiding appropriately with minimal PVR indicating suspicion for obstructive uropathy is low at this point. Denies inability to  void. Describes urine as red tinged but watery without clot. Unclear if he has a UTI based on urinalysis alone as difficulty to interpret given presence of stent. Agree with treating empirically until cultures result. Will continue to monitor scrotal pain. He has no flank pain.  -No need for surgical intervention at this time, continue to monitor Cr and support with fluid hydration and nephrology consulation  -Measure post-void residuals x 3, If <150 do not need to recheck  -If PVR >500 place foley catheter and contact urology  -Agree with searching for alternatives for AKI as he is clearly not obstructed on the LEFT and he has stable mild RIGHT sided hydronephrosis   -If creatinine not improving despite medical management, plan would be to consult interventional radiology for RIGHT PCN placement and monitoring for improvement (bladder is hostile and retrograde access to the RIGHT ureter was not possible based on tumor location)  -Hematuria is expected given invasive urothelial carcinoma but is only an issue if it leads to significant drop in H/H or urinary retention.   -Plan to follow-up at [facility] with Dr. [name] post-discharge  -We will continue to follow along  Discussed with Dr. Thereasa Solo 05/07/2015, 8:47 AM

## 2015-05-08 ENCOUNTER — Encounter (HOSPITAL_COMMUNITY): Payer: Self-pay | Admitting: Radiology

## 2015-05-08 ENCOUNTER — Inpatient Hospital Stay (HOSPITAL_COMMUNITY): Payer: Medicare Other

## 2015-05-08 DIAGNOSIS — R319 Hematuria, unspecified: Secondary | ICD-10-CM

## 2015-05-08 DIAGNOSIS — C689 Malignant neoplasm of urinary organ, unspecified: Secondary | ICD-10-CM

## 2015-05-08 DIAGNOSIS — N183 Chronic kidney disease, stage 3 (moderate): Secondary | ICD-10-CM

## 2015-05-08 DIAGNOSIS — N179 Acute kidney failure, unspecified: Principal | ICD-10-CM

## 2015-05-08 DIAGNOSIS — I10 Essential (primary) hypertension: Secondary | ICD-10-CM

## 2015-05-08 DIAGNOSIS — I480 Paroxysmal atrial fibrillation: Secondary | ICD-10-CM

## 2015-05-08 DIAGNOSIS — N1330 Unspecified hydronephrosis: Secondary | ICD-10-CM

## 2015-05-08 LAB — BASIC METABOLIC PANEL
Anion gap: 14 (ref 5–15)
BUN: 45 mg/dL — AB (ref 6–20)
CALCIUM: 7.9 mg/dL — AB (ref 8.9–10.3)
CHLORIDE: 104 mmol/L (ref 101–111)
CO2: 17 mmol/L — ABNORMAL LOW (ref 22–32)
CREATININE: 5.72 mg/dL — AB (ref 0.61–1.24)
GFR, EST AFRICAN AMERICAN: 10 mL/min — AB (ref >60)
GFR, EST NON AFRICAN AMERICAN: 8 mL/min — AB (ref >60)
Glucose, Bld: 84 mg/dL (ref 65–99)
Potassium: 5.2 mmol/L — ABNORMAL HIGH (ref 3.5–5.1)
SODIUM: 135 mmol/L (ref 135–145)

## 2015-05-08 LAB — CBC
HCT: 36.6 % — ABNORMAL LOW (ref 39.0–52.0)
HEMATOCRIT: 36.5 % — AB (ref 39.0–52.0)
HEMOGLOBIN: 11.3 g/dL — AB (ref 13.0–17.0)
Hemoglobin: 11.4 g/dL — ABNORMAL LOW (ref 13.0–17.0)
MCH: 28.8 pg (ref 26.0–34.0)
MCH: 28.4 pg (ref 26.0–34.0)
MCHC: 30.9 g/dL (ref 30.0–36.0)
MCHC: 31.2 g/dL (ref 30.0–36.0)
MCV: 93.4 fL (ref 78.0–100.0)
MCV: 90.8 fL (ref 78.0–100.0)
PLATELETS: 219 10*3/uL (ref 150–400)
PLATELETS: 246 10*3/uL (ref 150–400)
RBC: 3.92 MIL/uL — ABNORMAL LOW (ref 4.22–5.81)
RBC: 4.02 MIL/uL — ABNORMAL LOW (ref 4.22–5.81)
RDW: 14.8 % (ref 11.5–15.5)
RDW: 14.3 % (ref 11.5–15.5)
WBC: 12.2 10*3/uL — ABNORMAL HIGH (ref 4.0–10.5)
WBC: 12.7 10*3/uL — AB (ref 4.0–10.5)

## 2015-05-08 LAB — URINE CULTURE

## 2015-05-08 LAB — GLUCOSE, CAPILLARY: GLUCOSE-CAPILLARY: 77 mg/dL (ref 65–99)

## 2015-05-08 LAB — LACTIC ACID, PLASMA: LACTIC ACID, VENOUS: 0.9 mmol/L (ref 0.5–2.0)

## 2015-05-08 MED ORDER — IOHEXOL 300 MG/ML  SOLN
15.0000 mL | Freq: Once | INTRAMUSCULAR | Status: AC | PRN
  Administered 2015-05-08: 15 mL

## 2015-05-08 MED ORDER — LIDOCAINE HCL 1 % IJ SOLN
INTRAMUSCULAR | Status: AC
  Filled 2015-05-08: qty 20

## 2015-05-08 MED ORDER — FENTANYL CITRATE (PF) 100 MCG/2ML IJ SOLN
INTRAMUSCULAR | Status: AC
  Filled 2015-05-08: qty 4

## 2015-05-08 MED ORDER — MIDAZOLAM HCL 2 MG/2ML IJ SOLN
INTRAMUSCULAR | Status: AC | PRN
  Administered 2015-05-08 (×4): 1 mg via INTRAVENOUS

## 2015-05-08 MED ORDER — FENTANYL CITRATE (PF) 100 MCG/2ML IJ SOLN
INTRAMUSCULAR | Status: AC | PRN
  Administered 2015-05-08: 50 ug via INTRAVENOUS

## 2015-05-08 MED ORDER — MIDAZOLAM HCL 2 MG/2ML IJ SOLN
INTRAMUSCULAR | Status: AC
  Filled 2015-05-08: qty 6

## 2015-05-08 NOTE — Progress Notes (Signed)
Chief Complaint: Patient was seen in consultation today for bilateral hydronephrosis at the request of Dr. Louis Meckel  Referring Physician(s): Dr. Louis Meckel  History of Present Illness: Craig Waters. is a 82 y.o. male with hx of urothelial carcinoma. He has had prior (L)ureteral stent and PCN due to obstructive uropathy in the past. His (L) PCN was removed. He is now in ARF again likely due to obstructive uropathy. Imaging shows (R)hydro. There is mild (L)hydro with stent in good position. Chart, PMHx, meds, labs reviewed.  Past Medical History  Diagnosis Date  . Blood in stool   . Diverticulitis   . GERD (gastroesophageal reflux disease)   . Glaucoma   . Ulcer   . History of transfusion of whole blood   . Prostate disorder   . Shortness of breath 10-18-11    with exertion, cardiac cath done 7-8 yrs ago negative.  . Vertigo 10-18-11    inner ear issues occ. -tx. [place] as needed  . Arthritis 10-18-11    back, fingers  . H/O hiatal hernia 10-18-11    noted on recent CXR  . Adenomatous colon polyp   . Urothelial carcinoma ([place]) 05/2014  . PAF (paroxysmal atrial fibrillation) ([place])   . Rheumatoid arthritis ([place])   . Discitis of thoracic region 07/04/2012    Past Surgical History  Procedure Laterality Date  . Gastric ulcer  10-18-11    '91-blleding ulcer with blood transfusions after  . Appendectomy  10-18-11    age 38  . Cataract extraction, bilateral  10-18-11    bilateral   . Eye surgery  10-18-11    laser eye surgery s/p cataract bilaterally  . Cardiac catheterization  10-18-11    7-8 yrs ago-mild blockage,no tx. indicated  . Prostatectomy  10/27/2011    Procedure: PROSTATECTOMY SUPRAPUBIC;  Surgeon: Ailene Rud, MD;  Location: [facility];  Service: Urology;  Laterality: N/A;  Placement of suprapubic tube  . Cystoscopy  10/27/2011    Procedure: CYSTOSCOPY FLEXIBLE;  Surgeon: Ailene Rud, MD;  Location: [facility];  Service: Urology;  Laterality: N/A;  .  Transurethral resection of bladder tumor with gyrus (turbt-gyrus)  06/2014    T1 HG tumor.  [facility].    Allergies: Review of patient's allergies indicates no known allergies.  Medications:  Current facility-administered medications:  .  0.9 %  sodium chloride infusion, , Intravenous, Continuous, Roney Jaffe, MD, Last Rate: 125 mL/hr at 05/08/15 0151 .  acetaminophen (TYLENOL) tablet 650 mg, 650 mg, Oral, Q6H PRN **OR** acetaminophen (TYLENOL) suppository 650 mg, 650 mg, Rectal, Q6H PRN, Ivor Costa, MD .  acidophilus (RISAQUAD) capsule 1 capsule, 1 capsule, Oral, Daily, Ivor Costa, MD, 1 capsule at 05/08/15 0841 .  amiodarone (PACERONE) tablet 200 mg, 200 mg, Oral, BID, Ivor Costa, MD, 200 mg at 05/08/15 0841 .  B-complex with vitamin C tablet 1 tablet, 1 tablet, Oral, Daily, Ivor Costa, MD, 1 tablet at 05/08/15 0841 .  bisacodyl (DULCOLAX) EC tablet 5 mg, 5 mg, Oral, Daily PRN, Ivor Costa, MD, 5 mg at 05/07/15 1047 .  cefTRIAXone (ROCEPHIN) 1 g in dextrose 5 % 50 mL IVPB, 1 g, Intravenous, Q24H, Ivor Costa, MD, Last Rate: 100 mL/hr at 05/08/15 0218, 1 g at 05/08/15 0218 .  diltiazem (CARDIZEM CD) 24 hr capsule 240 mg, 240 mg, Oral, Daily, Ivor Costa, MD, 240 mg at 05/08/15 0840 .  hydrALAZINE (APRESOLINE) injection 5 mg, 5 mg, Intravenous, Q2H PRN, Ivor Costa, MD .  hyoscyamine (  LEVSIN SL) SL tablet 0.125 mg, 0.125 mg, Oral, Q4H PRN, Ivor Costa, MD .  ondansetron (ZOFRAN) tablet 4 mg, 4 mg, Oral, Q6H PRN **OR** ondansetron (ZOFRAN) injection 4 mg, 4 mg, Intravenous, Q6H PRN, Ivor Costa, MD .  pantoprazole (PROTONIX) injection 40 mg, 40 mg, Intravenous, Q24H, Ivor Costa, MD, 40 mg at 05/07/15 2236 .  polyethylene glycol (MIRALAX / GLYCOLAX) packet 17 g, 17 g, Oral, BID PRN, Ivor Costa, MD .  predniSONE (DELTASONE) tablet 15 mg, 15 mg, Oral, Q breakfast, Ivor Costa, MD, 15 mg at 05/08/15 0841 .  senna-docusate (Senokot-S) tablet 1 tablet, 1 tablet, Oral, BID, Ivor Costa, MD, 1 tablet at 05/08/15  0841 .  sodium chloride 0.9 % injection 3 mL, 3 mL, Intravenous, Q12H, Ivor Costa, MD, 3 mL at 05/08/15 [id number]    Family History  Problem Relation Age of Onset  . Breast cancer Sister   . Dementia Mother   . Heart attack Father   . Colon cancer Neg Hx     Social History   Social History  . Marital Status: Married    Spouse Name: N/A  . Number of Children: N/A  . Years of Education: N/A   Occupational History  . [employer] Self Employed   Social History Main Topics  . Smoking status: Former Smoker -- 1.00 packs/day for 5 years    Types: Cigarettes    Quit date: 09/26/1978  . Smokeless tobacco: Current User    Types: Chew    Last Attempt to Quit: 11/01/2011  . Alcohol Use: No  . Drug Use: No  . Sexual Activity: Not Asked   Other Topics Concern  . None   Social History Narrative   Married, one daughter. One cup of coffee a day. He is semiretired.     Review of Systems: A 12 point ROS discussed and pertinent positives are indicated in the HPI above.  All other systems are negative.  Review of Systems  Vital Signs: BP 158/82 mmHg  Pulse 88  Temp(Src) 98.1 F (36.7 C) (Oral)  Resp 18  Ht 5\' 11"  (1.803 m)  Wt 205 lb (92.987 kg)  BMI 28.60 kg/m2  SpO2 95%  Physical Exam  Constitutional: He is oriented to person, place, and time. He appears well-developed. No distress.  HENT:  Mouth/Throat: Oropharynx is clear and moist.  Neck: Normal range of motion. No tracheal deviation present.  Cardiovascular: Normal rate, regular rhythm and normal heart sounds.   Pulmonary/Chest: Effort normal and breath sounds normal. No respiratory distress.  Abdominal: Soft. He exhibits distension. There is no tenderness.  Neurological: He is alert and oriented to person, place, and time.  Skin: Skin is warm and dry.  Psychiatric: He has a normal mood and affect. Judgment normal.    Mallampati Score:  MD Evaluation Airway: WNL Heart: WNL Abdomen: WNL Chest/  Lungs: WNL ASA  Classification: 3 Mallampati/Airway Score: Two  Imaging: Ct Abdomen Pelvis Wo Contrast  05/07/2015  CLINICAL DATA:  Stated history of small bowel obstruction. Patient reports no bowel movement for 2 days, has not passed gas today. Hematuria. EXAM: CT ABDOMEN AND PELVIS WITHOUT CONTRAST TECHNIQUE: Multidetector CT imaging of the abdomen and pelvis was performed following the standard protocol without IV contrast. COMPARISON:  CT 03/14/2015 FINDINGS: Lower chest: Elevation of right hemidiaphragm with adjacent compressive atelectasis in the right middle lobe. Coronary artery calcifications are seen. Liver: Hypodense subcapsular liver lesion involving the right lobe with likely increased in size from  prior exam, currently 5.4 cm, previously 4.2 cm. Detailed hepatic evaluation limited secondary to lack of intravenous contrast. Hepatobiliary: Gallbladder physiologically distended, no calcified stone. No biliary dilatation. Pancreas: Fatty atrophy, no ductal dilatation. Spleen: Unremarkable noncontrast appearance. Adrenal glands: No nodule. Kidneys: Left ureteral stent in place. 59mm stone is seen adjacent to the stent in the left distal ureter, similar in position to prior. The previous left nephrostomy tube is no longer seen. There is mild dilatation of the renal pelvis and proximal ureter despite stent placement. Mild right hydroureteronephrosis is similar to prior, with ureteral dilatation throughout its course hypodense lesions within the right kidney, likely cysts. Stomach/Bowel: Stomach mildly distended with ingested contrast. There are no dilated or thickened small bowel loops. Small bowel loops are decompressed. Moderate stool in the right and transverse colon, small volume of stool in the descending and rectosigmoid colon. Equivocal colonic wall thickening involving the distal sigmoid. Appendix is not seen, surgically absent. Vascular/Lymphatic: Small retroperitoneal lymph nodes, not enlarged  by size criteria. Left pelvic sidewall node, 10 mm short axis. Abdominal aorta is normal in caliber. Moderate atherosclerosis without aneurysm. An IVC filter is in place. Reproductive: Post prostatectomy per report. Bladder: Marked wall irregularity and thickening. Minimal urine in the bladder lumen. Perivesicular inflammatory change, mildly increased in the interim. Other: No free air, free fluid, or intra-abdominal fluid collection. Fat within both inguinal canals. Musculoskeletal: There are no acute or suspicious osseous abnormalities. Stable degenerative change in the spine. Anterolisthesis of L3 on L4 with associated degenerative change. No destructive lytic or blastic osseous lesion. IMPRESSION: 1. No bowel obstruction. Low moderate stool burden in the right and transverse colon, with decompressed distal colon. Equivocal wall thickening of the distal sigmoid colon, may reflect minimal colitis in the appropriate clinical setting. 2. Left ureteral stent with minimal residual hydronephrosis and proximal ureter. 5 mm stone in the distal left ureter adjacent to the stent is seen. Unchanged right hydroureteronephrosis. 3. Diffuse bladder wall thickening and irregularity, with minimal increased thin perivesicular inflammation from prior. Small left pelvic lymph node is unchanged, concerning for nodal disease. 4. Right lobe hepatic lesion has minimally increased in size in the interim, concerning for progression of metastatic disease. Electronically Signed   By: Jeb Levering M.D.   On: 05/07/2015 01:24   US Scrotum  05/07/2015  CLINICAL DATA:  Bilateral testicular tenderness.  Chronic pain. EXAM: SCROTAL ULTRASOUND DOPPLER ULTRASOUND OF THE TESTICLES TECHNIQUE: Complete ultrasound examination of the testicles, epididymis, and other scrotal structures was performed. Color and spectral Doppler ultrasound were also utilized to evaluate blood flow to the testicles. COMPARISON:  None. FINDINGS: Right testicle  Measurements: 4.4 x 2.6 x 2.7 cm. No mass or microlithiasis visualized. Left testicle Measurements: 3.8 x 2.7 x 2.8 cm. No mass or microlithiasis visualized. Right epididymis:  Normal in size and appearance. Left epididymis: Small cyst in the epididymal tail measuring 3.4 mm. Normal in size and appearance. Hydrocele:  None visualized. Varicocele:  Mild on the left. Pulsed Doppler interrogation of both testes demonstrates normal low resistance arterial and venous waveforms bilaterally. IMPRESSION: 1. Normal sonographic appearance of the testicles. 2. Small left varicocele.  Tiny left epididymal head cyst. Electronically Signed   By: Jeb Levering M.D.   On: 05/07/2015 02:01   [place] Art/ven Flow Abd Pelv Doppler  05/07/2015  CLINICAL DATA:  Bilateral testicular tenderness.  Chronic pain. EXAM: SCROTAL ULTRASOUND DOPPLER ULTRASOUND OF THE TESTICLES TECHNIQUE: Complete ultrasound examination of the testicles, epididymis, and other scrotal structures was  performed. Color and spectral Doppler ultrasound were also utilized to evaluate blood flow to the testicles. COMPARISON:  None. FINDINGS: Right testicle Measurements: 4.4 x 2.6 x 2.7 cm. No mass or microlithiasis visualized. Left testicle Measurements: 3.8 x 2.7 x 2.8 cm. No mass or microlithiasis visualized. Right epididymis:  Normal in size and appearance. Left epididymis: Small cyst in the epididymal tail measuring 3.4 mm. Normal in size and appearance. Hydrocele:  None visualized. Varicocele:  Mild on the left. Pulsed Doppler interrogation of both testes demonstrates normal low resistance arterial and venous waveforms bilaterally. IMPRESSION: 1. Normal sonographic appearance of the testicles. 2. Small left varicocele.  Tiny left epididymal head cyst. Electronically Signed   By: Jeb Levering M.D.   On: 05/07/2015 02:01    Labs:  CBC:  Recent Labs  04/09/15 0518 05/06/15 1731 05/07/15 0407 05/08/15 0430  WBC 12.3* 13.3* 14.1* 12.2*  HGB 10.7* 13.0  12.4* 11.3*  HCT 33.4* 41.3 40.0 36.6*  PLT 181 296 309 219    COAGS:  Recent Labs  02/16/15 0735 03/17/15 0309 05/07/15 0406  INR 1.12 1.23 1.00  APTT  --   --  36    BMP:  Recent Labs  05/06/15 1731 05/07/15 0407 05/07/15 1430 05/08/15 0430  NA 137 136 133* 135  K 5.5* 5.3* 5.0 5.2*  CL 101 102 103 104  CO2 24 21* 21* 17*  GLUCOSE 116* 100* 113* 84  BUN 36* 41* 43* 45*  CALCIUM 9.0 8.8* 8.0* 7.9*  CREATININE 3.01* 4.08* 4.66* 5.72*  GFRNONAA 18* 12* 11* 8*  GFRAA 21* 14* 12* 10*    LIVER FUNCTION TESTS:  Recent Labs  03/16/15 0334 03/29/15 0900 04/05/15 0423 05/06/15 1731  BILITOT 0.6 0.5 0.4 0.5  AST 16 25 15 18   ALT 20 17 14* 12*  ALKPHOS 96 110 93 108  PROT 5.3* 6.1* 5.7* 7.2  ALBUMIN 2.4* 2.8* 2.6* 3.5    TUMOR MARKERS: No results for input(s): AFPTM, CEA, CA199, CHROMGRNA in the last 8760 hours.  Assessment and Plan: AKI from obstructive uropathy Plan for (R) and possible (L)PCN Labs ok. Has not received any recent anticoagulation. Risks and Benefits discussed with the patient including, but not limited to infection, bleeding, significant bleeding causing loss or decrease in renal function or damage to adjacent structures.  All of the patient's questions were answered, patient is agreeable to proceed. Consent signed and in chart.    Thank you for this interesting consult.  A copy of this report was sent to the requesting provider on this date.  Electronically Signed: Ascencion Dike 05/08/2015, 10:01 AM   I spent a total of 20 minutes in face to face in clinical consultation, greater than 50% of which was counseling/coordinating care for placement of nephrostomy tube(s)

## 2015-05-08 NOTE — Progress Notes (Signed)
ID: [id number] with locally advanced bladder cancer causing left-sided ureteral obstruction for which she has a double-J ureteral stent which is in good position.  His last resection was in December 2016, at which point the right ureter was not visualized due to progression of his tumor.  His CT scan on it admission demonstrated no significant progression of his hydronephrosis.  He was having some hematuria.  Interval: Over the past 24 hours, the patient has made very little urine.  He is otherwise without complaint.  He denies any pain.  Is on any fevers or chills.  His bladder scans have been minimal.  Filed Vitals:   05/07/15 1607 05/07/15 1658 05/07/15 2036 05/08/15 0531  BP: 158/86 167/82 173/84 158/82  Pulse: 89 91 94 88  Temp: 98.8 F (37.1 C) 98.8 F (37.1 C) 99.4 F (37.4 C) 98.1 F (36.7 C)  TempSrc: Oral Oral Oral Oral  Resp: 17 20 20 18   Height:  5\' 11"  (1.803 m)    Weight:  92.987 kg (205 lb)    SpO2: 96% 96% 94% 95%    Intake/Output Summary (Last 24 hours) at 05/08/15 1214 Last data filed at 05/08/15 0700  Gross per 24 hour  Intake 3010.41 ml  Output      0 ml  Net 3010.41 ml  NAD Abdomen is soft, NT Ext symmetric   Recent Labs  05/06/15 1731 05/07/15 0407 05/08/15 0430  WBC 13.3* 14.1* 12.2*  HGB 13.0 12.4* 11.3*  HCT 41.3 40.0 36.6*    Recent Labs  05/07/15 0407 05/07/15 1430 05/08/15 0430  NA 136 133* 135  K 5.3* 5.0 5.2*  CL 102 103 104  CO2 21* 21* 17*  GLUCOSE 100* 113* 84  BUN 41* 43* 45*  CREATININE 4.08* 4.66* 5.72*  CALCIUM 8.8* 8.0* 7.9*    Recent Labs  05/07/15 0406  INR 1.00   No results for input(s): PSA in the last 72 hours. No results for input(s): LABURIN in the last 72 hours. Results for orders placed or performed during the hospital encounter of 05/06/15  MRSA PCR Screening     Status: None   Collection Time: 05/07/15  5:32 PM  Result Value Ref Range Status   MRSA by PCR NEGATIVE NEGATIVE Final    Comment:        The  GeneXpert MRSA Assay (FDA approved for NASAL specimens only), is one component of a comprehensive MRSA colonization surveillance program. It is not intended to diagnose MRSA infection nor to guide or monitor treatment for MRSA infections.      Imp: Worsening acute renal failure on chronic renal failure.  At this point he appears to be an uric.  His bladder scans of been minimal.  This would suggest that you have the patient is prerenal or has developed some obstructive renal uropathy.  Recommendations: I still maintained that this is likely a pre-renal etiology which has lead to ATN.  However, with his obstruction and progression of his bladder cancer, he may have developed obstructive renal failure.  As such, I think there is little to lose by placing nephrostomy tubes at this point.  He does have a stent on the left side which appears to be in a good spot.  However, this may be obstructed as well.  I spoke with interventional radiology, they will plan to perform a nephrostogram in an antegrade fashion on the left side and place a nephrostomy tube if there is no drainage and it appears to be obstructed.  I recommended a Foley catheter be inserted as well. He should also continue with aggressive rehydration.  Nephrology is following.  I will continue to follow as well.

## 2015-05-08 NOTE — Sedation Documentation (Signed)
Pt resting with eyes close and appears to be in no distress.

## 2015-05-08 NOTE — Sedation Documentation (Signed)
Patient denies pain and is resting comfortably.  

## 2015-05-08 NOTE — Progress Notes (Signed)
Pt bladder scanned 77-80cc noted pt unable to urinate, reported to day RN to pass on to MD on rounds, as MD  Rexene Alberts) stated will follow up during morning rounds. SRP, RN

## 2015-05-08 NOTE — Progress Notes (Signed)
  [facility] Progress Note   Subjective: no complaints. Perc neph tubes placed today w good response  Filed Vitals:   05/08/15 1536 05/08/15 1543 05/08/15 1615 05/08/15 1645  BP: 148/83 150/89 155/77 157/78  Pulse: 83 83 79 80  Temp:   97.6 F (36.4 C) 97.5 F (36.4 C)  TempSrc:   Oral Oral  Resp: '15 13 16 14  '$ Height:      Weight:      SpO2: 96% 96% 95% 94%    Inpatient medications: . acidophilus  1 capsule Oral Daily  . amiodarone  200 mg Oral BID  . B-complex with vitamin C  1 tablet Oral Daily  . cefTRIAXone (ROCEPHIN)  IV  1 g Intravenous Q24H  . diltiazem  240 mg Oral Daily  . lidocaine      . pantoprazole (PROTONIX) IV  40 mg Intravenous Q24H  . predniSONE  15 mg Oral Q breakfast  . senna-docusate  1 tablet Oral BID  . sodium chloride  3 mL Intravenous Q12H   . sodium chloride 125 mL/hr at 05/08/15 1037   acetaminophen **OR** acetaminophen, bisacodyl, hydrALAZINE, hyoscyamine, ondansetron **OR** ondansetron (ZOFRAN) IV, polyethylene glycol  Exam: Alert, no distress, calm No rash, cyanosis or gangrene Sclera anicteric, throat clear NO jvd, flat neck veins Chest clear to bases bilat RRR no MRG Abd soft ntnd no mass, no ascites, no SP bulging Normal male penis/ scrotum MS no joint effusions Ext trace pretib edema bilat, no UE edema Neuro alert, pleasant Ox 3  UA turbid, red, large Hb, largeLE/neg nitrite, 7.5, >300 prot, 1.027, epi none, TNTC rbc and wbc's  Date CreateGFRCKD 2013-140.73- 1.03 Jan 75170.0174 Oct 201612.06 >> 1.85 at dc3- 32Stage III- V Dec 20161.66- 1.9131- 37Stage III Jan 123.0112 Jan 134.0811  Home meds > tylenol, tyl 3, amiodarone,  cardizem CD 240, Levsin, zofran, miralax, prednisone 15 /d, phenergan, cipro, MVI  Assessment: 1 Acute on CKD 3 - obstructive w no UOP >> had bilat perc neph tubes placed today with good UOP > 1L so far.  Not a dialysis candidate w severe comorbidities.  2 Bladder cancer - high-grade, intermittent debulking by [facility], bilat ureteral involvement 3 Hyperkalemia - getting better after resin 4 BP - not on BP meds, BP's normal   Plan - doing well, cont IVF's. Low K diet.    Kelly Splinter MD [facility] pager [phone]    cell [phone] 05/08/2015, 7:18 PM    Recent Labs Lab 05/07/15 0407 05/07/15 1430 05/08/15 0430  NA 136 133* 135  K 5.3* 5.0 5.2*  CL 102 103 104  CO2 21* 21* 17*  GLUCOSE 100* 113* 84  BUN 41* 43* 45*  CREATININE 4.08* 4.66* 5.72*  CALCIUM 8.8* 8.0* 7.9*    Recent Labs Lab 05/06/15 1731  AST 18  ALT 12*  ALKPHOS 108  BILITOT 0.5  PROT 7.2  ALBUMIN 3.5    Recent Labs Lab 05/07/15 0407 05/08/15 0430 05/08/15 1841  WBC 14.1* 12.2* 12.7*  HGB 12.4* 11.3* 11.4*  HCT 40.0 36.6* 36.5*  MCV 92.6 93.4 90.8  PLT 309 219 246

## 2015-05-08 NOTE — Progress Notes (Signed)
PT Cancellation Note  Patient Details Name: Craig Waters. MRN: [id number] DOB: [birth year]   Cancelled Treatment:    Reason Eval/Treat Not Completed: Other (comment) Pt and RN reports plan for procedure today.  Pt wishes to "save energy" for procedure and declines PT until afterwards. Will check back as schedule permits.   Mitzi Lilja,KATHrine E 05/08/2015, 10:05 AM Carmelia Bake, PT, DPT 05/08/2015 Pager: [phone]

## 2015-05-08 NOTE — Progress Notes (Signed)
OT Cancellation Note  Patient Details Name: Craig Waters. MRN: [id number] DOB: 1930-12-31   Cancelled Treatment:    Reason Eval/Treat Not Completed: Other (comment). Pt reports that he has a procedure today and does not want to participate in therapy prior to this procedure. Pt also with complaints of nausea that's limiting him at this time. Will check back as schedule allows. Thank you for the order.    Chrys Racer , MS, OTR/L, CLT Pager: [phone]  05/08/2015, 12:29 PM

## 2015-05-08 NOTE — Progress Notes (Signed)
Spoke with Dr. Louis Meckel, concerning UOP-- instructed to cont to monitor and recheck Bladder scan in am. Will follow up. SRP, RN

## 2015-05-08 NOTE — Progress Notes (Signed)
PROGRESS NOTE  Craig Waters. LR:[id number] DOB: Jun 27, 1930 DOA: 05/06/2015 PCP: [name], MD  HPI/Recap of past 24 hours:  Cr continue to increase, Npo, awaiting for nephrostomy tube placement Denies pain, no sob, no n/v, reported has not urinated since yesterday  Assessment/Plan: Principal Problem:   UTI (lower urinary tract infection) Active Problems:   BPH (benign prostatic hyperplasia)   PUD (peptic ulcer disease)   GERD (gastroesophageal reflux disease)   HTN (hypertension)   Constipation   Urothelial carcinoma (HCC)   Rheumatoid arthritis (HCC)   Hyperkalemia   Bilateral hydronephrosis   Left nephrolithiasis   Acute renal failure superimposed on stage 3 chronic kidney disease (HCC)   Hematuria   PAF (paroxysmal atrial fibrillation) ([place])   Scrotum pain  ARF on CKDIII, with mild hyperkalemia/uremia /acidosis Likely multifactorial including obstructive nephropathy, uti, prerenal, on ivf/abx,  to have percutaneous nephrostomy tube placed on 1/14 Will follow urology/nephrology/IR  Rec's.  bladder cancer/hematuria: Status post resection of the tumor at [facility]. Per discharge summary, his urologist said that pt would have hematuria since he has high grade invasive bladder CA, likely incurable. Per his urologist, pt is not a candidate for cystectomy Urology consulted inhouse.  UTI (lower urinary tract infection): UA is positive and pt is symptomatically. Patient is not septic on admission,  lactic acid level wnl. Hemodynamically stable. -  Ceftriaxone by IV - Follow up results of urine and blood cx and amend antibiotic regimen if needed per sensitivity results  Scrotum pain: Likely due to UTI and urothelial cancer. Scrotum US is negative. -Symptomatic treatment with pain medication, check gc/chlamydia  History of recent DVT: Patient is status post IVC filter placement. Patient is not anticoagulated secondary to ongoing hematuria. No signs of pulmonary  embolism  Atrial Fibrillation: CHA2DS2-VASc Score is 3, needs oral anticoagulation, but patient is no on AC at home due to hematuria. Heart rate is well controlled. -continue amiodarone, Cardizem. In NSR since admission.  Constipation; -Fleet enema x 1 -MiraLAX, Senecal  GERD: -Protonix  RA: stable -continue home prednisone. No signs of AI.  HTN (hypertension): Bp 190/93 -Continue Cardizem -IV hydralazine when necessary   DVT ppx: none (patient has hematuria, cannot use heparin. Patient has DVT, cannot use SCD)  Code Status: Full code Family Communication: patient Disposition Plan: remain in pateint   Consultants:  Urology  Interventional radiology  nephrology  Procedures:  Percutaneous nephrostomy tube placement on 1/14 by IR  Antibiotics:  Rocephin since admission   Objective: BP 158/82 mmHg  Pulse 88  Temp(Src) 98.1 F (36.7 C) (Oral)  Resp 18  Ht 5\' 11"  (1.803 m)  Wt 92.987 kg (205 lb)  BMI 28.60 kg/m2  SpO2 95%  Intake/Output Summary (Last 24 hours) at 05/08/15 1208 Last data filed at 05/08/15 0700  Gross per 24 hour  Intake 3010.41 ml  Output      0 ml  Net 3010.41 ml   Filed Weights   05/06/15 1454 05/07/15 1658  Weight: 92.987 kg (205 lb) 92.987 kg (205 lb)    Exam:   General:  NAD  Cardiovascular: RRR  Respiratory: CTABL  Abdomen: Soft/ND/NT, positive BS  Musculoskeletal: No Edema  Neuro: aaox3  Data Reviewed: Basic Metabolic Panel:  Recent Labs Lab 05/06/15 1731 05/07/15 0407 05/07/15 1430 05/08/15 0430  NA 137 136 133* 135  K 5.5* 5.3* 5.0 5.2*  CL 101 102 103 104  CO2 24 21* 21* 17*  GLUCOSE 116* 100* 113* 84  BUN 36* 41* 43*  45*  CREATININE 3.01* 4.08* 4.66* 5.72*  CALCIUM 9.0 8.8* 8.0* 7.9*   Liver Function Tests:  Recent Labs Lab 05/06/15 1731  AST 18  ALT 12*  ALKPHOS 108  BILITOT 0.5  PROT 7.2  ALBUMIN 3.5    Recent Labs Lab 05/06/15 1731  LIPASE 21   No results for input(s): AMMONIA  in the last 168 hours. CBC:  Recent Labs Lab 05/06/15 1731 05/07/15 0407 05/08/15 0430  WBC 13.3* 14.1* 12.2*  HGB 13.0 12.4* 11.3*  HCT 41.3 40.0 36.6*  MCV 91.2 92.6 93.4  PLT 296 309 219   Cardiac Enzymes:   No results for input(s): CKTOTAL, CKMB, CKMBINDEX, TROPONINI in the last 168 hours. BNP (last 3 results)  Recent Labs  02/15/15 1206 03/14/15 1220  BNP 522.9* 137.0*    ProBNP (last 3 results) No results for input(s): PROBNP in the last 8760 hours.  CBG:  Recent Labs Lab 05/07/15 0756 05/08/15 0740  GLUCAP 106* 77    Recent Results (from the past 240 hour(s))  MRSA PCR Screening     Status: None   Collection Time: 05/07/15  5:32 PM  Result Value Ref Range Status   MRSA by PCR NEGATIVE NEGATIVE Final    Comment:        The GeneXpert MRSA Assay (FDA approved for NASAL specimens only), is one component of a comprehensive MRSA colonization surveillance program. It is not intended to diagnose MRSA infection nor to guide or monitor treatment for MRSA infections.      Studies: No results found.  Scheduled Meds: . acidophilus  1 capsule Oral Daily  . amiodarone  200 mg Oral BID  . B-complex with vitamin C  1 tablet Oral Daily  . cefTRIAXone (ROCEPHIN)  IV  1 g Intravenous Q24H  . diltiazem  240 mg Oral Daily  . pantoprazole (PROTONIX) IV  40 mg Intravenous Q24H  . predniSONE  15 mg Oral Q breakfast  . senna-docusate  1 tablet Oral BID  . sodium chloride  3 mL Intravenous Q12H    Continuous Infusions: . sodium chloride 125 mL/hr at 05/08/15 1037     Time spent: 57mins  Rain Wilhide MD, PhD  [facility] Pager [phone]. If 7PM-7AM, please contact night-coverage at [email or web address], password [facility] 05/08/2015, 12:08 PM  LOS: 1 day

## 2015-05-09 ENCOUNTER — Other Ambulatory Visit: Payer: Self-pay | Admitting: Interventional Radiology

## 2015-05-09 DIAGNOSIS — K5909 Other constipation: Secondary | ICD-10-CM

## 2015-05-09 LAB — BASIC METABOLIC PANEL
Anion gap: 12 (ref 5–15)
BUN: 50 mg/dL — AB (ref 6–20)
CHLORIDE: 111 mmol/L (ref 101–111)
CO2: 18 mmol/L — ABNORMAL LOW (ref 22–32)
Calcium: 8.3 mg/dL — ABNORMAL LOW (ref 8.9–10.3)
Creatinine, Ser: 5.11 mg/dL — ABNORMAL HIGH (ref 0.61–1.24)
GFR calc Af Amer: 11 mL/min — ABNORMAL LOW (ref >60)
GFR calc non Af Amer: 9 mL/min — ABNORMAL LOW (ref >60)
Glucose, Bld: 137 mg/dL — ABNORMAL HIGH (ref 65–99)
POTASSIUM: 5 mmol/L (ref 3.5–5.1)
SODIUM: 141 mmol/L (ref 135–145)

## 2015-05-09 LAB — CBC
HCT: 34.2 % — ABNORMAL LOW (ref 39.0–52.0)
Hemoglobin: 10.8 g/dL — ABNORMAL LOW (ref 13.0–17.0)
MCH: 29 pg (ref 26.0–34.0)
MCHC: 31.6 g/dL (ref 30.0–36.0)
MCV: 91.7 fL (ref 78.0–100.0)
PLATELETS: 244 10*3/uL (ref 150–400)
RBC: 3.73 MIL/uL — ABNORMAL LOW (ref 4.22–5.81)
RDW: 14.8 % (ref 11.5–15.5)
WBC: 10.2 10*3/uL (ref 4.0–10.5)

## 2015-05-09 LAB — HIV ANTIBODY (ROUTINE TESTING W REFLEX): HIV SCREEN 4TH GENERATION: NONREACTIVE

## 2015-05-09 LAB — GLUCOSE, CAPILLARY: GLUCOSE-CAPILLARY: 111 mg/dL — AB (ref 65–99)

## 2015-05-09 MED ORDER — PANTOPRAZOLE SODIUM 40 MG PO TBEC
40.0000 mg | DELAYED_RELEASE_TABLET | Freq: Every day | ORAL | Status: DC
  Administered 2015-05-09 – 2015-05-10 (×2): 40 mg via ORAL
  Filled 2015-05-09 (×2): qty 1

## 2015-05-09 MED ORDER — STERILE WATER FOR INJECTION IV SOLN
INTRAVENOUS | Status: DC
  Administered 2015-05-09 – 2015-05-11 (×5): via INTRAVENOUS
  Filled 2015-05-09 (×5): qty 850

## 2015-05-09 MED ORDER — SENNOSIDES-DOCUSATE SODIUM 8.6-50 MG PO TABS
2.0000 | ORAL_TABLET | Freq: Two times a day (BID) | ORAL | Status: DC
  Administered 2015-05-09 – 2015-05-11 (×4): 2 via ORAL
  Filled 2015-05-09 (×4): qty 2

## 2015-05-09 MED ORDER — BISACODYL 10 MG RE SUPP: 10.0000 mg | Freq: Once | RECTAL | Status: DC

## 2015-05-09 NOTE — Progress Notes (Signed)
   [facility] Progress Note   Subjective: no complaints. Perc neph tubes placed today w good response  Filed Vitals:   05/08/15 1645 05/08/15 2109 05/09/15 0501 05/09/15 1429  BP: 157/78 158/70 129/55 135/61  Pulse: 80 85 83 81  Temp: 97.5 F (36.4 C) 97.9 F (36.6 C) 98 F (36.7 C) 97.8 F (36.6 C)  TempSrc: Oral Oral Oral Oral  Resp: '14 16 16 18  '$ Height:      Weight:      SpO2: 94% 97% 96% 98%    Inpatient medications: . acidophilus  1 capsule Oral Daily  . amiodarone  200 mg Oral BID  . B-complex with vitamin C  1 tablet Oral Daily  . bisacodyl  10 mg Rectal Once  . cefTRIAXone (ROCEPHIN)  IV  1 g Intravenous Q24H  . diltiazem  240 mg Oral Daily  . pantoprazole  40 mg Oral QHS  . predniSONE  15 mg Oral Q breakfast  . senna-docusate  2 tablet Oral BID  . sodium chloride  3 mL Intravenous Q12H   .  sodium bicarbonate 150 mEq in sterile water 1000 mL infusion     acetaminophen **OR** acetaminophen, bisacodyl, hydrALAZINE, hyoscyamine, ondansetron **OR** ondansetron (ZOFRAN) IV, polyethylene glycol  Exam: Alert, no distress, calm No rash, cyanosis or gangrene Sclera anicteric, throat clear NO jvd, flat neck veins Chest clear to bases bilat RRR no MRG Abd soft ntnd no mass, no ascites, no SP bulging Normal male penis/ scrotum MS no joint effusions Ext trace pretib edema bilat, no UE edema Neuro alert, pleasant Ox 3  UA turbid, red, large Hb, largeLE/neg nitrite, 7.5, >300 prot, 1.027, epi none, TNTC rbc and wbc's  Date CreateGFRCKD 2013-140.73- 1.03 Jan 02774.1287 Oct 201612.06 >> 1.85 at dc3- 32Stage III- V Dec 20161.66- 1.9131- 37Stage III Jan 123.0112 Jan  134.0811  Home meds > tylenol, tyl 3, amiodarone, cardizem CD 240, Levsin, zofran, miralax, prednisone 15 /d, phenergan, cipro, MVI  Assessment: 1 Acute on CKD 3 - due to obstruction from known bladder cancer.  SP bilat PCN w good response. Have changed fluids to Na bicarb for mild acidosis. Should continue to improve.  Poor candidate for dialysis w severe comorbidities. No further suggestions, will sign off.   2 Bladder cancer - high-grade, intermittent debulking by [facility], bilat ureteral involvement 3 Hyperkalemia - better 4 BP - not on BP meds, BP's normal   Plan - as above.     Kelly Splinter MD [facility] pager [phone]    cell [phone] 05/09/2015, 5:22 PM    Recent Labs Lab 05/07/15 1430 05/08/15 0430 05/09/15 0510  NA 133* 135 141  K 5.0 5.2* 5.0  CL 103 104 111  CO2 21* 17* 18*  GLUCOSE 113* 84 137*  BUN 43* 45* 50*  CREATININE 4.66* 5.72* 5.11*  CALCIUM 8.0* 7.9* 8.3*    Recent Labs Lab 05/06/15 1731  AST 18  ALT 12*  ALKPHOS 108  BILITOT 0.5  PROT 7.2  ALBUMIN 3.5    Recent Labs Lab 05/08/15 0430 05/08/15 1841 05/09/15 0510  WBC 12.2* 12.7* 10.2  HGB 11.3* 11.4* 10.8*  HCT 36.6* 36.5* 34.2*  MCV 93.4 90.8 91.7  PLT 219 246 244

## 2015-05-09 NOTE — Progress Notes (Signed)
PROGRESS NOTE  [name] [id number] DOB: 1931-01-08 DOA: 05/06/2015 PCP: [name], MD  HPI/Recap of past 24 hours:  s/p bilateral percutaneous nephrostomy tube placement, foley with minimal pink urine Denies pain, no sob, no n/v, no fever  Assessment/Plan: Principal Problem:   UTI (lower urinary tract infection) Active Problems:   BPH (benign prostatic hyperplasia)   PUD (peptic ulcer disease)   GERD (gastroesophageal reflux disease)   HTN (hypertension)   Constipation   Urothelial carcinoma (HCC)   Rheumatoid arthritis (HCC)   Hyperkalemia   Bilateral hydronephrosis   Left nephrolithiasis   Acute renal failure superimposed on stage 3 chronic kidney disease (HCC)   Hematuria   PAF (paroxysmal atrial fibrillation) (HCC)   Scrotum pain  ARF on CKDIII, with mild hyperkalemia/uremia /acidosis Likely multifactorial including obstructive nephropathy, uti, prerenal, on ivf/abx,  to have percutaneous nephrostomy tube placed on 1/14 Some bilateral ankle pitting edema, will follow instruction from nephrology for fluids management. Appreciate urology/nephrology/IR  Input, will follow rec's.  bladder cancer/hematuria: Status post resection of the tumor at [facility]. Per discharge summary, his urologist said that pt would have hematuria since he has high grade invasive bladder CA, likely incurable. Per his urologist, pt is not a candidate for cystectomy Urology consulted inhouse.  UTI (lower urinary tract infection):  UA from admission is positive and pt is symptomatic, Patient is not septic on admission,  lactic acid level wnl. Hemodynamically stable. -  Ceftriaxone by IV - blood culture negative, initial urine culture witn 40000 colonies of group b strep, urine culture from bilateral nephrostomy , no growth, urology to decide abx duration.  Leukocytosis: wbc 14 on admission, normalized on 1/15  Scrotum pain:  -Likely due to UTI and urothelial cancer. Scrotum  US is negative. -Symptomatic treatment with pain medication, check gc/chlamydia  History of recent DVT: Patient is status post IVC filter placement. Patient is not anticoagulated secondary to ongoing hematuria. No signs of pulmonary embolism  Atrial Fibrillation: CHA2DS2-VASc Score is 3, needs oral anticoagulation, but patient is no on AC at home due to hematuria. Heart rate is well controlled. -continue amiodarone, Cardizem. In NSR since admission.  Constipation; -Fleet enema x 1 -MiraLAX, Senecal  GERD: -Protonix  RA: stable -continue home prednisone. No signs of AI.  HTN (hypertension): Bp 190/93 -Continue Cardizem -IV hydralazine when necessary   DVT ppx: none (patient has hematuria, cannot use heparin. Patient has DVT, cannot use SCD)  Code Status: Full code Family Communication: patient Disposition Plan: remain in patient, pending clearance from urology   Consultants:  Urology  Interventional radiology  nephrology  Procedures:  Bilateral Percutaneous nephrostomy tube placement on 1/14 by IR  Antibiotics:  Rocephin since admission   Objective: BP 129/55 mmHg  Pulse 83  Temp(Src) 98 F (36.7 C) (Oral)  Resp 16  Ht 5\' 11"  (1.803 m)  Wt 92.987 kg (205 lb)  BMI 28.60 kg/m2  SpO2 96%  Intake/Output Summary (Last 24 hours) at 05/09/15 1213 Last data filed at 05/09/15 1143  Gross per 24 hour  Intake 2198.33 ml  Output   4535 ml  Net -2336.67 ml   Filed Weights   05/06/15 1454 05/07/15 1658  Weight: 92.987 kg (205 lb) 92.987 kg (205 lb)    Exam:   General:  NAD  Cardiovascular: RRR  Respiratory: CTABL  Abdomen: Soft/ND/NT, positive BS, s/p bilateral nephrostomy tubes placement  Musculoskeletal: pitting Edema bilateral ankle  Neuro: aaox3  Data Reviewed: Basic Metabolic Panel:  Recent Labs Lab  05/06/15 1731 05/07/15 0407 05/07/15 1430 05/08/15 0430 05/09/15 0510  NA 137 136 133* 135 141  K 5.5* 5.3* 5.0 5.2* 5.0  CL 101 102  103 104 111  CO2 24 21* 21* 17* 18*  GLUCOSE 116* 100* 113* 84 137*  BUN 36* 41* 43* 45* 50*  CREATININE 3.01* 4.08* 4.66* 5.72* 5.11*  CALCIUM 9.0 8.8* 8.0* 7.9* 8.3*   Liver Function Tests:  Recent Labs Lab 05/06/15 1731  AST 18  ALT 12*  ALKPHOS 108  BILITOT 0.5  PROT 7.2  ALBUMIN 3.5    Recent Labs Lab 05/06/15 1731  LIPASE 21   No results for input(s): AMMONIA in the last 168 hours. CBC:  Recent Labs Lab 05/06/15 1731 05/07/15 0407 05/08/15 0430 05/08/15 1841 05/09/15 0510  WBC 13.3* 14.1* 12.2* 12.7* 10.2  HGB 13.0 12.4* 11.3* 11.4* 10.8*  HCT 41.3 40.0 36.6* 36.5* 34.2*  MCV 91.2 92.6 93.4 90.8 91.7  PLT 296 309 219 246 244   Cardiac Enzymes:   No results for input(s): CKTOTAL, CKMB, CKMBINDEX, TROPONINI in the last 168 hours. BNP (last 3 results)  Recent Labs  02/15/15 1206 03/14/15 1220  BNP 522.9* 137.0*    ProBNP (last 3 results) No results for input(s): PROBNP in the last 8760 hours.  CBG:  Recent Labs Lab 05/07/15 0756 05/08/15 0740  GLUCAP 106* 77    Recent Results (from the past 240 hour(s))  Urine culture     Status: None   Collection Time: 05/06/15 10:58 PM  Result Value Ref Range Status   Specimen Description URINE, CLEAN CATCH  Final   Special Requests NONE  Final   Culture   Final    40,000 COLONIES/ml GROUP B STREP(S.AGALACTIAE)ISOLATED TESTING AGAINST S. AGALACTIAE NOT ROUTINELY PERFORMED DUE TO PREDICTABILITY OF AMP/PEN/VAN SUSCEPTIBILITY. Performed at [facility]    Report Status 05/08/2015 FINAL  Final  Culture, blood (x 2)     Status: None (Preliminary result)   Collection Time: 05/07/15  4:06 AM  Result Value Ref Range Status   Specimen Description BLOOD LEFT ANTECUBITAL  Final   Special Requests BOTTLES DRAWN AEROBIC AND ANAEROBIC 5ML  Final   Culture   Final    NO GROWTH 1 DAY Performed at [facility]    Report Status PENDING  Incomplete  Culture, blood (x 2)     Status: None  (Preliminary result)   Collection Time: 05/07/15  4:30 AM  Result Value Ref Range Status   Specimen Description BLOOD RIGHT FOREARM  Final   Special Requests BOTTLES DRAWN AEROBIC AND ANAEROBIC 5ML  Final   Culture   Final    NO GROWTH 1 DAY Performed at [facility]    Report Status PENDING  Incomplete  MRSA PCR Screening     Status: None   Collection Time: 05/07/15  5:32 PM  Result Value Ref Range Status   MRSA by PCR NEGATIVE NEGATIVE Final    Comment:        The GeneXpert MRSA Assay (FDA approved for NASAL specimens only), is one component of a comprehensive MRSA colonization surveillance program. It is not intended to diagnose MRSA infection nor to guide or monitor treatment for MRSA infections.   Body fluid culture     Status: None (Preliminary result)   Collection Time: 05/08/15  4:03 PM  Result Value Ref Range Status   Specimen Description KIDNEY LEFT  Final   Special Requests NONE  Final   Gram Stain  Final    ABUNDANT WBC PRESENT, PREDOMINANTLY MONONUCLEAR NO ORGANISMS SEEN    Culture   Final    NO GROWTH < 24 HOURS Performed at [facility]    Report Status PENDING  Incomplete  Body fluid culture     Status: None (Preliminary result)   Collection Time: 05/08/15  4:03 PM  Result Value Ref Range Status   Specimen Description KIDNEY RIGHT  Final   Special Requests NONE  Final   Gram Stain   Final    ABUNDANT WBC PRESENT, PREDOMINANTLY MONONUCLEAR NO ORGANISMS SEEN    Culture   Final    NO GROWTH < 24 HOURS Performed at [facility]    Report Status PENDING  Incomplete     Studies: Ir Nephrostomy Placement Left  05/09/2015  CLINICAL DATA:  Anuric EXAM: IR NEPHROSTOMY PLACEMENT LEFT; IR NEPHROSTOMY PLACEMENT RIGHT FLUOROSCOPY TIME:  Three minutes ANESTHESIA/SEDATION: Moderate sedation time: 30 minutes CONTRAST:  10 cc Omnipaque 300 PROCEDURE: The procedure, risks, benefits, and alternatives were explained to the patient. Questions  regarding the procedure were encouraged and answered. The patient understands and consents to the procedure. The back was prepped with ChloraPrep in a sterile fashion, and a sterile drape was applied covering the operative field. A sterile gown and sterile gloves were used for the procedure. Under sonographic guidance, a 21 gauge needle was inserted into a posterior lower pole calyx of the right kidney. Contrast was injected opacifying the collecting system. The needle was removed over a 018 wire which was up sized to a 3 J. A 10 French dilator followed by a 10 [place] nephrostomy or advanced over the wire. It was looped and string fixed within the renal pelvis. It was sewn to the skin. Contrast was injected. Under sonographic guidance, a 21 gauge needle was advanced into the left renal pelvis. Contrast and gas were injected. A second needle was then advanced into the lower pole calyx and removed over a 018 wire which was up sized to a 3 J. A 10 French dilator followed by a 10 [place] drain were inserted. It was looped and string fixed in the renal pelvis. Contrast was injected. FINDINGS: Imaging documents access into both lower pole collecting systems via 21 gauge needles. Contrast fills the renal collecting systems bilaterally demonstrating hydronephrosis an ureteral obstruction. Final imaging demonstrates bilateral 10 French nephrostomy catheters. COMPLICATIONS: None IMPRESSION: Successful bilateral nephrostomy catheter placement for ureteral obstruction. Electronically Signed   By: Marybelle Killings M.D.   On: 05/09/2015 08:11   Ir Nephrostomy Placement Right  05/09/2015  CLINICAL DATA:  Anuric EXAM: IR NEPHROSTOMY PLACEMENT LEFT; IR NEPHROSTOMY PLACEMENT RIGHT FLUOROSCOPY TIME:  Three minutes ANESTHESIA/SEDATION: Moderate sedation time: 30 minutes CONTRAST:  10 cc Omnipaque 300 PROCEDURE: The procedure, risks, benefits, and alternatives were explained to the patient. Questions regarding the procedure were  encouraged and answered. The patient understands and consents to the procedure. The back was prepped with ChloraPrep in a sterile fashion, and a sterile drape was applied covering the operative field. A sterile gown and sterile gloves were used for the procedure. Under sonographic guidance, a 21 gauge needle was inserted into a posterior lower pole calyx of the right kidney. Contrast was injected opacifying the collecting system. The needle was removed over a 018 wire which was up sized to a 3 J. A 10 French dilator followed by a 10 [place] nephrostomy or advanced over the wire. It was looped and string fixed within the renal pelvis. It  was sewn to the skin. Contrast was injected. Under sonographic guidance, a 21 gauge needle was advanced into the left renal pelvis. Contrast and gas were injected. A second needle was then advanced into the lower pole calyx and removed over a 018 wire which was up sized to a 3 J. A 10 French dilator followed by a 10 [place] drain were inserted. It was looped and string fixed in the renal pelvis. Contrast was injected. FINDINGS: Imaging documents access into both lower pole collecting systems via 21 gauge needles. Contrast fills the renal collecting systems bilaterally demonstrating hydronephrosis an ureteral obstruction. Final imaging demonstrates bilateral 10 French nephrostomy catheters. COMPLICATIONS: None IMPRESSION: Successful bilateral nephrostomy catheter placement for ureteral obstruction. Electronically Signed   By: Marybelle Killings M.D.   On: 05/09/2015 08:11    Scheduled Meds: . acidophilus  1 capsule Oral Daily  . amiodarone  200 mg Oral BID  . B-complex with vitamin C  1 tablet Oral Daily  . cefTRIAXone (ROCEPHIN)  IV  1 g Intravenous Q24H  . diltiazem  240 mg Oral Daily  . pantoprazole  40 mg Oral QHS  . predniSONE  15 mg Oral Q breakfast  . senna-docusate  1 tablet Oral BID  . sodium chloride  3 mL Intravenous Q12H    Continuous Infusions: . sodium chloride  125 mL/hr at 05/08/15 2016     Time spent: 19mins  Kendrik Mcshan MD, PhD  [facility] Pager [phone]. If 7PM-7AM, please contact night-coverage at [email or web address], password [facility] 05/09/2015, 12:13 PM  LOS: 2 days

## 2015-05-09 NOTE — Evaluation (Signed)
Physical Therapy Evaluation Patient Details Name: Pheonix Obas. MRN: [id number] DOB: 05-13-1930 Today's Date: 05/09/2015   History of Present Illness  82 yo male admitted with UTI. Hx of urothelial cancer, RA, DVT, IVC filter, vertigo, PAF, CKD, glaucoma.   Clinical Impression  On eval, pt required Min guard assist for mobility-walked ~60 feet with RW. Distance limited by catheter tube holder discomfort. Pt c/o feeling weak. Recommend HHPT vs no follow up depending on progress.     Follow Up Recommendations Home health PT vs No PT follow up (depending on progress-may not need if progresses well)    Equipment Recommendations  None recommended by PT    Recommendations for Other Services       Precautions / Restrictions Precautions Precautions: Fall Precaution Comments: bil neph tubes Restrictions Weight Bearing Restrictions: No      Mobility  Bed Mobility Overal bed mobility: Modified Independent                Transfers Overall transfer level: Needs assistance Equipment used: Rolling walker (2 wheeled) Transfers: Sit to/from Stand Sit to Stand: Supervision            Ambulation/Gait Ambulation/Gait assistance: Min guard Ambulation Distance (Feet): 60 Feet Assistive device: Rolling walker (2 wheeled) Gait Pattern/deviations: Step-through pattern;Wide base of support     General Gait Details: close guard for safety. Distance limited due to irritation/discomfort in genital area from catheter tube holder.   Stairs            Wheelchair Mobility    Modified Rankin (Stroke Patients Only)       Balance                                             Pertinent Vitals/Pain Pain Assessment: Faces Faces Pain Scale: Hurts little more Pain Location: soreness at drain sites; pt also c/o discomfort around genital area from catheter tube sticker/holder Pain Intervention(s): Monitored during session    [place]  expects to be discharged to:: Private residence Living Arrangements: Children Available Help at Discharge: Family Type of Home: House Home Access: Level entry     Home Layout: One level Home Equipment: Environmental consultant - 2 wheels;Cane - single point;Bedside commode;Shower seat      Prior Function Level of Independence: Independent               Hand Dominance        Extremity/Trunk Assessment   Upper Extremity Assessment: Generalized weakness           Lower Extremity Assessment: Generalized weakness      Cervical / Trunk Assessment: Normal  Communication   Communication: No difficulties  Cognition Arousal/Alertness: Awake/alert Behavior During Therapy: WFL for tasks assessed/performed Overall Cognitive Status: Within Functional Limits for tasks assessed                      General Comments      Exercises        Assessment/Plan    PT Assessment Patient needs continued PT services  PT Diagnosis Difficulty walking;Generalized weakness   PT Problem List Decreased strength;Decreased activity tolerance;Decreased balance;Pain;Decreased mobility  PT Treatment Interventions DME instruction;Gait training;Functional mobility training;Therapeutic activities;Patient/family education;Balance training;Therapeutic exercise   PT Goals (Current goals can be found in the Care Plan section) Acute Rehab PT Goals Patient Stated Goal: none stated PT  Goal Formulation: With patient Time For Goal Achievement: 05/23/15 Potential to Achieve Goals: Good    Frequency Min 3X/week   Barriers to discharge        Co-evaluation               End of Session   Activity Tolerance: Patient tolerated treatment well Patient left: in chair;with call bell/phone within reach           Time: 1145-1158 PT Time Calculation (min) (ACUTE ONLY): 13 min   Charges:   PT Evaluation $PT Eval Moderate Complexity: 1 Procedure     PT G Codes:        Weston Anna,  MPT Pager: [phone]

## 2015-05-09 NOTE — Progress Notes (Signed)
Resumed care of patient. Agree with previous assessment. Will continue to monitor. 

## 2015-05-09 NOTE — Progress Notes (Signed)
ID: [id number] with locally advanced bladder cancer causing left-sided ureteral obstruction for which she has a double-J ureteral stent which is in good position.  His last resection was in December 2016, at which point the right ureter was not visualized due to progression of his tumor.  His CT scan on it admission demonstrated no significant progression of his hydronephrosis.  He was having some hematuria.  Interval: Patient received bilateral neph tubes yesterday and has had a great response in his UOP.   No complaints today Foley has had minimal output - urine pink  Filed Vitals:   05/08/15 1615 05/08/15 1645 05/08/15 2109 05/09/15 0501  BP: 155/77 157/78 158/70 129/55  Pulse: 79 80 85 83  Temp: 97.6 F (36.4 C) 97.5 F (36.4 C) 97.9 F (36.6 C) 98 F (36.7 C)  TempSrc: Oral Oral Oral Oral  Resp: 16 14 16 16   Height:      Weight:      SpO2: 95% 94% 97% 96%    Intake/Output Summary (Last 24 hours) at 05/09/15 0926 Last data filed at 05/09/15 0600  Gross per 24 hour  Intake 1958.33 ml  Output   3235 ml  Net -1276.67 ml  NAD Abdomen is soft, NT Right nephrostomy tube urine straw colored, insertion site dressed, nontender Left nephrostomy tube urine clear, insertion site dressed and nontender No suprapubic tenderness Foley catheter is draining pink urine, minimal Ext symmetric   Recent Labs  05/08/15 0430 05/08/15 1841 05/09/15 0510  WBC 12.2* 12.7* 10.2  HGB 11.3* 11.4* 10.8*  HCT 36.6* 36.5* 34.2*    Recent Labs  05/07/15 1430 05/08/15 0430 05/09/15 0510  NA 133* 135 141  K 5.0 5.2* 5.0  CL 103 104 111  CO2 21* 17* 18*  GLUCOSE 113* 84 137*  BUN 43* 45* 50*  CREATININE 4.66* 5.72* 5.11*  CALCIUM 8.0* 7.9* 8.3*    Recent Labs  05/07/15 0406  INR 1.00   No results for input(s): PSA in the last 72 hours. No results for input(s): LABURIN in the last 72 hours. Results for orders placed or performed during the hospital encounter of 05/06/15  Urine culture      Status: None   Collection Time: 05/06/15 10:58 PM  Result Value Ref Range Status   Specimen Description URINE, CLEAN CATCH  Final   Special Requests NONE  Final   Culture   Final    40,000 COLONIES/ml GROUP B STREP(S.AGALACTIAE)ISOLATED TESTING AGAINST S. AGALACTIAE NOT ROUTINELY PERFORMED DUE TO PREDICTABILITY OF AMP/PEN/VAN SUSCEPTIBILITY. Performed at [facility]    Report Status 05/08/2015 FINAL  Final  Culture, blood (x 2)     Status: None (Preliminary result)   Collection Time: 05/07/15  4:06 AM  Result Value Ref Range Status   Specimen Description BLOOD LEFT ANTECUBITAL  Final   Special Requests BOTTLES DRAWN AEROBIC AND ANAEROBIC 5ML  Final   Culture   Final    NO GROWTH 1 DAY Performed at [facility]    Report Status PENDING  Incomplete  Culture, blood (x 2)     Status: None (Preliminary result)   Collection Time: 05/07/15  4:30 AM  Result Value Ref Range Status   Specimen Description BLOOD RIGHT FOREARM  Final   Special Requests BOTTLES DRAWN AEROBIC AND ANAEROBIC 5ML  Final   Culture   Final    NO GROWTH 1 DAY Performed at [facility]    Report Status PENDING  Incomplete  MRSA PCR Screening  Status: None   Collection Time: 05/07/15  5:32 PM  Result Value Ref Range Status   MRSA by PCR NEGATIVE NEGATIVE Final    Comment:        The GeneXpert MRSA Assay (FDA approved for NASAL specimens only), is one component of a comprehensive MRSA colonization surveillance program. It is not intended to diagnose MRSA infection nor to guide or monitor treatment for MRSA infections.   Body fluid culture     Status: None (Preliminary result)   Collection Time: 05/08/15  4:03 PM  Result Value Ref Range Status   Specimen Description KIDNEY LEFT  Final   Special Requests NONE  Final   Gram Stain   Final    ABUNDANT WBC PRESENT, PREDOMINANTLY MONONUCLEAR NO ORGANISMS SEEN Performed at [facility]    Culture PENDING  Incomplete   Report  Status PENDING  Incomplete  Body fluid culture     Status: None (Preliminary result)   Collection Time: 05/08/15  4:03 PM  Result Value Ref Range Status   Specimen Description KIDNEY RIGHT  Final   Special Requests NONE  Final   Gram Stain   Final    ABUNDANT WBC PRESENT, PREDOMINANTLY MONONUCLEAR NO ORGANISMS SEEN Performed at [facility]    Culture PENDING  Incomplete   Report Status PENDING  Incomplete     Imp: Worsening acute renal failure on chronic renal failure. The patient's urine output has significantly improved after bilateral percutaneous nephrostomy tube placement. This includes the patient's left side which previously had a stent, suggesting that the stent that was placed approximately 6 weeks ago has failed. The patient clearly has had progression of his disease now obstructing both ureters.  Recommendations: At this point, the patient will need to continue with his nephrostomy tubes. I would not suggest internalizing these at this point unless the tumor is debulked enough within the bladder so that it does not re-obstruct.  Given very little drainage from his bladder, the Foley catheter could be removed at this point. We will continue to follow his creatinine.  The patient has established himself at the [facility] for the his bladder cancer. Previously, the patient had a prostatectomy from Dr. Gaynelle Arabian and identifies him as his primary urologist. The question moving forward is where he will continue to get care for his bladder cancer and the associated issues that arise as his cancer progresses.

## 2015-05-10 ENCOUNTER — Ambulatory Visit
Admit: 2015-05-10 | Discharge: 2015-05-10 | Disposition: A | Discharge status: Home / Self Care | Payer: Medicare Other | Attending: Radiation Oncology | Admitting: Radiation Oncology

## 2015-05-10 DIAGNOSIS — C689 Malignant neoplasm of urinary organ, unspecified: Secondary | ICD-10-CM

## 2015-05-10 LAB — BASIC METABOLIC PANEL
Anion gap: 10 (ref 5–15)
BUN: 40 mg/dL — AB (ref 6–20)
CHLORIDE: 110 mmol/L (ref 101–111)
CO2: 25 mmol/L (ref 22–32)
CREATININE: 2.71 mg/dL — AB (ref 0.61–1.24)
Calcium: 8.1 mg/dL — ABNORMAL LOW (ref 8.9–10.3)
GFR calc Af Amer: 24 mL/min — ABNORMAL LOW (ref >60)
GFR calc non Af Amer: 20 mL/min — ABNORMAL LOW (ref >60)
GLUCOSE: 117 mg/dL — AB (ref 65–99)
Potassium: 4.2 mmol/L (ref 3.5–5.1)
SODIUM: 145 mmol/L (ref 135–145)

## 2015-05-10 LAB — CBC
HCT: 31.9 % — ABNORMAL LOW (ref 39.0–52.0)
Hemoglobin: 10.1 g/dL — ABNORMAL LOW (ref 13.0–17.0)
MCH: 28.7 pg (ref 26.0–34.0)
MCHC: 31.7 g/dL (ref 30.0–36.0)
MCV: 90.6 fL (ref 78.0–100.0)
PLATELETS: 242 10*3/uL (ref 150–400)
RBC: 3.52 MIL/uL — ABNORMAL LOW (ref 4.22–5.81)
RDW: 14.9 % (ref 11.5–15.5)
WBC: 9.6 10*3/uL (ref 4.0–10.5)

## 2015-05-10 LAB — GC/CHLAMYDIA PROBE AMP (~~LOC~~) NOT AT ARMC
CHLAMYDIA, DNA PROBE: NEGATIVE
NEISSERIA GONORRHEA: NEGATIVE

## 2015-05-10 LAB — GLUCOSE, CAPILLARY: Glucose-Capillary: 121 mg/dL — ABNORMAL HIGH (ref 65–99)

## 2015-05-10 MED ORDER — PROMETHAZINE HCL 25 MG/ML IJ SOLN
12.5000 mg | Freq: Four times a day (QID) | INTRAMUSCULAR | Status: DC | PRN
  Administered 2015-05-10 – 2015-05-11 (×3): 12.5 mg via INTRAVENOUS
  Filled 2015-05-10 (×3): qty 1

## 2015-05-10 NOTE — Care Management Important Message (Signed)
Important Message  Patient Details  Name: Quention Scura. MRN: [id number] Date of Birth: Jun 03, 1930   Medicare Important Message Given:  Yes    Camillo Flaming 05/10/2015, 10:42 AMImportant Message  Patient Details  Name: Tavarez Afonso. MRN: [id number] Date of Birth: 05-Jun-1930   Medicare Important Message Given:  Yes    Camillo Flaming 05/10/2015, 10:42 AM

## 2015-05-10 NOTE — Care Management Note (Signed)
Case Management Note  Patient Details  Name: Craig Waters. MRN: [id number] Date of Birth: 05-02-1930  Subjective/Objective: [facility] chosen for [facility]-drain mgmnt, HHPT. Rep Santiago Glad w/[facility] following.                   Action/Plan:d/c home w/[facility]HC.   Expected Discharge Date:   (UNKNOWN)               Expected Discharge Plan:  [place]  In-House Referral:     Discharge planning Services  CM Consult  Post Acute Care Choice:    Choice offered to:  Patient  DME Arranged:    DME Agency:     HH Arranged:  RN, PT [place] Agency:  [place]  Status of Service:  In process, will continue to follow  Medicare Important Message Given:  Yes Date Medicare IM Given:    Medicare IM give by:    Date Additional Medicare IM Given:    Additional Medicare Important Message give by:     If discussed at [place] of Stay Meetings, dates discussed:    Additional Comments:  Dessa Phi, RN 05/10/2015, 3:43 PM

## 2015-05-10 NOTE — Progress Notes (Addendum)
Subjective: Patient reports : bowel movements with laxatives, resolving nausea. Urine from perc tubes. Wants Urology Rx in [place]. Does not want to return to [facility].    Note Urologic hx of: open prostatectomy for BPH, 2013, with urethral stone removal2014. Pt developed gross painless hematuria while visiting at [facility], with Urology evaluation showing bladder cancer. F/u TUR-BT at [facility].   Objective: Vital signs in last 24 hours: Temp:  [97.8 F (36.6 C)-98.2 F (36.8 C)] 98.2 F (36.8 C) (01/16 0533) Pulse Rate:  [78-81] 80 (01/16 0533) Resp:  [16-18] 16 (01/16 0533) BP: (135-148)/(61-70) 148/70 mmHg (01/16 0533) SpO2:  [98 %] 98 % (01/16 0533)A  Intake/Output from previous day: 01/15 0701 - 01/16 0700 In: 4198.3 [P.O.:720; I.V.:3458.3] Out: 5350 [Urine:5350] Intake/Output this shift: Total I/O In: 360 [P.O.:360] Out: 500 [Urine:500]  Past Medical History  Diagnosis Date  . Blood in stool   . Diverticulitis   . GERD (gastroesophageal reflux disease)   . Glaucoma   . Ulcer   . History of transfusion of whole blood   . Prostate disorder   . Shortness of breath 10-18-11    with exertion, cardiac cath done 7-8 yrs ago negative.  . Vertigo 10-18-11    inner ear issues occ. -tx. [place] as needed  . Arthritis 10-18-11    back, fingers  . H/O hiatal hernia 10-18-11    noted on recent CXR  . Adenomatous colon polyp   . Urothelial carcinoma ([place]) 05/2014  . PAF (paroxysmal atrial fibrillation) ([place])   . Rheumatoid arthritis ([place])   . Discitis of thoracic region 07/04/2012    Physical Exam:  Lungs - Normal respiratory effort, chest expands symmetrically.  Abdomen - Soft, non-tender & non-distended. Bilateral perc nephrostomies: draining serosanguinous urine.   Lab Results:  Recent Labs  05/08/15 1841 05/09/15 0510 05/10/15 0408  WBC 12.7* 10.2 9.6  HGB 11.4* 10.8* 10.1*  HCT 36.5* 34.2* 31.9*   BMET  Recent Labs  05/09/15 0510 05/10/15 0408  NA 141 145  K 5.0 4.2   CL 111 110  CO2 18* 25  GLUCOSE 137* 117*  BUN 50* 40*  CREATININE 5.11* 2.71*  CALCIUM 8.3* 8.1*   No results for input(s): LABURIN in the last 72 hours. Results for orders placed or performed during the hospital encounter of 05/06/15  Urine culture     Status: None   Collection Time: 05/06/15 10:58 PM  Result Value Ref Range Status   Specimen Description URINE, CLEAN CATCH  Final   Special Requests NONE  Final   Culture   Final    40,000 COLONIES/ml GROUP B STREP(S.AGALACTIAE)ISOLATED TESTING AGAINST S. AGALACTIAE NOT ROUTINELY PERFORMED DUE TO PREDICTABILITY OF AMP/PEN/VAN SUSCEPTIBILITY. Performed at [facility]    Report Status 05/08/2015 FINAL  Final  Culture, blood (x 2)     Status: None (Preliminary result)   Collection Time: 05/07/15  4:06 AM  Result Value Ref Range Status   Specimen Description BLOOD LEFT ANTECUBITAL  Final   Special Requests BOTTLES DRAWN AEROBIC AND ANAEROBIC 5ML  Final   Culture   Final    NO GROWTH 2 DAYS Performed at [facility]    Report Status PENDING  Incomplete  Culture, blood (x 2)     Status: None (Preliminary result)   Collection Time: 05/07/15  4:30 AM  Result Value Ref Range Status   Specimen Description BLOOD RIGHT FOREARM  Final   Special Requests BOTTLES DRAWN AEROBIC AND ANAEROBIC 5ML  Final  Culture   Final    NO GROWTH 2 DAYS Performed at [facility]    Report Status PENDING  Incomplete  MRSA PCR Screening     Status: None   Collection Time: 05/07/15  5:32 PM  Result Value Ref Range Status   MRSA by PCR NEGATIVE NEGATIVE Final    Comment:        The GeneXpert MRSA Assay (FDA approved for NASAL specimens only), is one component of a comprehensive MRSA colonization surveillance program. It is not intended to diagnose MRSA infection nor to guide or monitor treatment for MRSA infections.   Body fluid culture     Status: None (Preliminary result)   Collection Time: 05/08/15  4:03 PM  Result  Value Ref Range Status   Specimen Description KIDNEY LEFT  Final   Special Requests NONE  Final   Gram Stain   Final    ABUNDANT WBC PRESENT, PREDOMINANTLY MONONUCLEAR NO ORGANISMS SEEN    Culture   Final    NO GROWTH < 24 HOURS Performed at [facility]    Report Status PENDING  Incomplete  Body fluid culture     Status: None (Preliminary result)   Collection Time: 05/08/15  4:03 PM  Result Value Ref Range Status   Specimen Description KIDNEY RIGHT  Final   Special Requests NONE  Final   Gram Stain   Final    ABUNDANT WBC PRESENT, PREDOMINANTLY MONONUCLEAR NO ORGANISMS SEEN    Culture   Final    NO GROWTH < 24 HOURS Performed at [facility]    Report Status PENDING  Incomplete    Studies/Results: No results found.  Assessment:  Current AKD/CKD Diabetes Rheumatoid Arthritis  DJD; and bladder cancer. Thought to not be a candidate for cystectomy at [facility]. No report of Radiation therapy or Oncology evaluation noted by pt or daughter. He has an IVC Filter with hx of DVT.    Review of daughter ( [name]'s ) notes: 1. Not a chemo candidate                                                                   2. Not a cystectomy candidate                                                                  3.  Not thought to have metastatic cancer, although he noticed enlarged lymph nodes outside bladder.                                                                  4. Although x-ray mentioned stone, Dr.Daniel Ruckstalis did not find it in the ureter. Daughter is confused to find that father has a new JJ stent, although no stone was identified.     Plan:  1. Call Dr. Felipa Eth and see if he has records from [facility]. If not, then will request records from [facility].  If so, will ask for records.  ( NB: Dr. Felipa Eth has NO urology notes).  2. Phone call to daughter, [name]: review notes from [facility]. 3. Discuss case with Oncology and Radiation therapy-may be best to present  to Cancer Conference. 4. Perc tubes to straight drain.   Naphtali Riede I Gerber Penza 05/10/2015, 10:28 AM    Addendum: family conference with pt and daughter: 45 minutes.

## 2015-05-10 NOTE — Evaluation (Signed)
Occupational Therapy Evaluation Patient Details Name: Craig Waters. MRN: [id number] DOB: 1931/02/27 Today's Date: 05/10/2015    History of Present Illness 82 yo male admitted with UTI. Hx of urothelial cancer, RA, DVT, IVC filter, vertigo, PAF, CKD, glaucoma.    Clinical Impression   Patient presenting with increased pain, increased lethargy, and decreased ADL & functional mobility independence secondary to above. Patient independent to mod I PTA. Patient currently functioning at a min assist level for functional mobility/transfers and up to mod assist for ADLs. Patient will benefit from acute OT to increase overall independence in the areas of ADLs, functional mobility, and overall safety in order to safely discharge home with daughter.     Follow Up Recommendations  No OT follow up;Supervision - Intermittent    Equipment Recommendations  None recommended by OT    Recommendations for Other Services  None at this time   Precautions / Restrictions Precautions Precautions: Fall Precaution Comments: bil neph tubes Restrictions Weight Bearing Restrictions: No    Mobility Bed Mobility Overal bed mobility: Modified Independent Transfers Overall transfer level: Needs assistance Equipment used: None Transfers: Sit to/from Stand Sit to Stand: Min guard General transfer comment: Min guard for safety    Balance Overall balance assessment: Needs assistance Sitting-balance support: No upper extremity supported;Feet supported Sitting balance-Leahy Scale: Good     Standing balance support: No upper extremity supported;During functional activity Standing balance-Leahy Scale: Fair    ADL Overall ADL's : Needs assistance/impaired Eating/Feeding: Set up;Sitting   Grooming: Set up;Sitting   Upper Body Bathing: Minimal assitance;Sitting   Lower Body Bathing: Moderate assistance;Sit to/from stand   Upper Body Dressing : Minimal assistance;Sitting   Lower Body Dressing:  Moderate assistance;Sit to/from stand   Toilet Transfer: Min guard;Grab bars;Comfort height toilet;Ambulation   Toileting- Clothing Manipulation and Hygiene: Sitting/lateral lean;Supervision/safety     Clinical cytogeneticist Details (indicate cue type and reason): did not occur Functional mobility during ADLs: Min guard;Cueing for safety General ADL Comments: Educated on use of 3-n-1 over toilet seat for safe and effective toilet transfers    Pertinent Vitals/Pain Pain Assessment: Faces Faces Pain Scale: Hurts little more Pain Location: drainsites, genital area while sitting EOB, stomach(nausea) Pain Descriptors / Indicators: Grimacing;Guarding;Discomfort;Cramping Pain Intervention(s): Limited activity within patient's tolerance;Monitored during session;Repositioned     Hand Dominance Right   Extremity/Trunk Assessment Upper Extremity Assessment Upper Extremity Assessment: Generalized weakness   Lower Extremity Assessment Lower Extremity Assessment: Generalized weakness   Cervical / Trunk Assessment Cervical / Trunk Assessment: Normal   Communication Communication Communication: No difficulties   Cognition Arousal/Alertness: Awake/alert Behavior During Therapy: WFL for tasks assessed/performed Overall Cognitive Status: Within Functional Limits for tasks assessed              Home Living Family/patient expects to be discharged to:: Private residence Living Arrangements: Children Available Help at Discharge: Family;Available 24 hours/day (daughter who does not work) Type of Home: House Home Access: Level entry     Home Layout: One level     Bathroom Shower/Tub: [place] unit;Door   [employer] Toilet: Standard     Home Equipment: Environmental consultant - 2 wheels;Cane - single point;Bedside commode;Shower seat    Prior Functioning/Environment Level of Independence: Independent     OT Diagnosis: Generalized weakness;Acute pain   OT Problem List: Decreased strength;Decreased  activity tolerance;Impaired balance (sitting and/or standing);Decreased safety awareness;Decreased knowledge of use of DME or AE;Pain   OT Treatment/Interventions: Self-care/ADL training;Therapeutic exercise;Energy conservation;DME and/or AE instruction;Therapeutic activities;Patient/family education;Balance training    OT Goals(Current  goals can be found in the care plan section) Acute Rehab OT Goals Patient Stated Goal: none stated OT Goal Formulation: With patient Time For Goal Achievement: 05/24/15 Potential to Achieve Goals: Good ADL Goals Pt Will Perform Grooming: standing;with modified independence Pt Will Perform Lower Body Bathing: with modified independence;sit to/from stand Pt Will Perform Lower Body Dressing: with modified independence;sit to/from stand Pt Will Transfer to Toilet: with modified independence;ambulating;bedside commode Pt Will Perform Tub/Shower Transfer: Tub transfer;ambulating;rolling walker;with modified independence Additional ADL Goal #1: Pt will be educated on energy conservation techniques in order to increase independence and safety with ADLs. Pt will be able to verbalize at least 3 techniques independently.   OT Frequency: Min 2X/week   Barriers to D/C: None known at this time   End of Session Nurse Communication: Mobility status;Other (comment) (pt up in chair, had BM during OT session)  Activity Tolerance: Patient limited by lethargy Patient left: in chair;with call bell/phone within reach;with chair alarm set   Time: 0953-1006 OT Time Calculation (min): 13 min Charges:  OT General Charges $OT Visit: 1 Procedure OT Evaluation $OT Eval Moderate Complexity: 1 Procedure  Chrys Racer , MS, OTR/L, CLT Pager: [phone]  05/10/2015, 10:15 AM

## 2015-05-10 NOTE — Progress Notes (Signed)
PROGRESS NOTE  Craig Waters. [id number] DOB: February 22, 1931 DOA: 05/06/2015 PCP: [name], MD  HPI/Recap of past 24 hours:  s/p bilateral percutaneous nephrostomy tube placement, foley with minimal pink urine Denies pain, no sob, no n/v, no fever, cr improving  Assessment/Plan: Principal Problem:   UTI (lower urinary tract infection) Active Problems:   BPH (benign prostatic hyperplasia)   PUD (peptic ulcer disease)   GERD (gastroesophageal reflux disease)   HTN (hypertension)   Constipation   Urothelial carcinoma (HCC)   Rheumatoid arthritis (HCC)   Hyperkalemia   Bilateral hydronephrosis   Left nephrolithiasis   Acute renal failure superimposed on stage 3 chronic kidney disease (HCC)   Hematuria   PAF (paroxysmal atrial fibrillation) (HCC)   Scrotum pain  ARF on CKDIII, with mild hyperkalemia/uremia /acidosis Likely multifactorial including obstructive nephropathy, uti, prerenal, on ivf/abx,  to have percutaneous nephrostomy tube placed on 1/14 Some bilateral ankle pitting edema, will follow instruction from nephrology for fluids management. Will follow urology recommendations about abx and foley Appreciate urology/nephrology/IR  Input, will follow rec's.  bladder cancer/hematuria: Status post resection of the tumor at [facility]. Per discharge summary, his urologist said that pt would have hematuria since he has high grade invasive bladder CA, likely incurable. Per his urologist, pt is not a candidate for cystectomy Urology consulted inhouse.  UTI (lower urinary tract infection):  UA from admission is positive and pt is symptomatic, Patient is not septic on admission,  lactic acid level wnl. Hemodynamically stable. -  Ceftriaxone by IV - blood culture negative, initial urine culture witn 40000 colonies of group b strep, urine culture from bilateral nephrostomy , no growth, urology to decide abx duration.  Leukocytosis: wbc 14 on admission, normalized on  1/15  Scrotum pain:  -Likely due to UTI and urothelial cancer. Scrotum US is negative. -Symptomatic treatment with pain medication, negative gc/chlamydia  History of recent DVT: Patient is status post IVC filter placement. Patient is not anticoagulated secondary to ongoing hematuria. No signs of pulmonary embolism  Atrial Fibrillation: CHA2DS2-VASc Score is 3, needs oral anticoagulation, but patient is no on AC at home due to hematuria. Heart rate is well controlled. -continue amiodarone, Cardizem. In NSR since admission.  Constipation; -Fleet enema x 1 -MiraLAX, Senecal  GERD: -Protonix  RA: stable -continue home prednisone. No signs of AI.  HTN (hypertension):  Bp 190/93 initial on admission -Continue Cardizem -IV hydralazine when necessary -better   DVT ppx: none (patient has hematuria, cannot use heparin. Patient has DVT, cannot use SCD)  Code Status: Full code Family Communication: patient Disposition Plan: remain in patient, pending clearance from urology   Consultants:  Urology  Interventional radiology  nephrology  Procedures:  Bilateral Percutaneous nephrostomy tube placement on 1/14 by IR  Antibiotics:  Rocephin since admission   Objective: BP 148/70 mmHg  Pulse 80  Temp(Src) 98.2 F (36.8 C) (Oral)  Resp 16  Ht 5\' 11"  (1.803 m)  Wt 92.987 kg (205 lb)  BMI 28.60 kg/m2  SpO2 98%  Intake/Output Summary (Last 24 hours) at 05/10/15 0843 Last data filed at 05/10/15 [id number]  Gross per 24 hour  Intake 4558.34 ml  Output   5350 ml  Net -791.66 ml   Filed Weights   05/06/15 1454 05/07/15 1658  Weight: 92.987 kg (205 lb) 92.987 kg (205 lb)    Exam:   General:  NAD  Cardiovascular: RRR  Respiratory: CTABL  Abdomen: Soft/ND/NT, positive BS, s/p bilateral nephrostomy tubes placement  Musculoskeletal: pitting Edema  bilateral ankle  Neuro: aaox3  Data Reviewed: Basic Metabolic Panel:  Recent Labs Lab 05/07/15 0407 05/07/15 1430  05/08/15 0430 05/09/15 0510 05/10/15 0408  NA 136 133* 135 141 145  K 5.3* 5.0 5.2* 5.0 4.2  CL 102 103 104 111 110  CO2 21* 21* 17* 18* 25  GLUCOSE 100* 113* 84 137* 117*  BUN 41* 43* 45* 50* 40*  CREATININE 4.08* 4.66* 5.72* 5.11* 2.71*  CALCIUM 8.8* 8.0* 7.9* 8.3* 8.1*   Liver Function Tests:  Recent Labs Lab 05/06/15 1731  AST 18  ALT 12*  ALKPHOS 108  BILITOT 0.5  PROT 7.2  ALBUMIN 3.5    Recent Labs Lab 05/06/15 1731  LIPASE 21   No results for input(s): AMMONIA in the last 168 hours. CBC:  Recent Labs Lab 05/07/15 0407 05/08/15 0430 05/08/15 1841 05/09/15 0510 05/10/15 0408  WBC 14.1* 12.2* 12.7* 10.2 9.6  HGB 12.4* 11.3* 11.4* 10.8* 10.1*  HCT 40.0 36.6* 36.5* 34.2* 31.9*  MCV 92.6 93.4 90.8 91.7 90.6  PLT 309 219 246 244 242   Cardiac Enzymes:   No results for input(s): CKTOTAL, CKMB, CKMBINDEX, TROPONINI in the last 168 hours. BNP (last 3 results)  Recent Labs  02/15/15 1206 03/14/15 1220  BNP 522.9* 137.0*    ProBNP (last 3 results) No results for input(s): PROBNP in the last 8760 hours.  CBG:  Recent Labs Lab 05/07/15 0756 05/08/15 0740 05/09/15 0756 05/10/15 0738  GLUCAP 106* 77 111* 121*    Recent Results (from the past 240 hour(s))  Urine culture     Status: None   Collection Time: 05/06/15 10:58 PM  Result Value Ref Range Status   Specimen Description URINE, CLEAN CATCH  Final   Special Requests NONE  Final   Culture   Final    40,000 COLONIES/ml GROUP B STREP(S.AGALACTIAE)ISOLATED TESTING AGAINST S. AGALACTIAE NOT ROUTINELY PERFORMED DUE TO PREDICTABILITY OF AMP/PEN/VAN SUSCEPTIBILITY. Performed at [facility]    Report Status 05/08/2015 FINAL  Final  Culture, blood (x 2)     Status: None (Preliminary result)   Collection Time: 05/07/15  4:06 AM  Result Value Ref Range Status   Specimen Description BLOOD LEFT ANTECUBITAL  Final   Special Requests BOTTLES DRAWN AEROBIC AND ANAEROBIC 5ML  Final   Culture    Final    NO GROWTH 2 DAYS Performed at [facility]    Report Status PENDING  Incomplete  Culture, blood (x 2)     Status: None (Preliminary result)   Collection Time: 05/07/15  4:30 AM  Result Value Ref Range Status   Specimen Description BLOOD RIGHT FOREARM  Final   Special Requests BOTTLES DRAWN AEROBIC AND ANAEROBIC 5ML  Final   Culture   Final    NO GROWTH 2 DAYS Performed at [facility]    Report Status PENDING  Incomplete  MRSA PCR Screening     Status: None   Collection Time: 05/07/15  5:32 PM  Result Value Ref Range Status   MRSA by PCR NEGATIVE NEGATIVE Final    Comment:        The GeneXpert MRSA Assay (FDA approved for NASAL specimens only), is one component of a comprehensive MRSA colonization surveillance program. It is not intended to diagnose MRSA infection nor to guide or monitor treatment for MRSA infections.   Body fluid culture     Status: None (Preliminary result)   Collection Time: 05/08/15  4:03 PM  Result Value Ref Range  Status   Specimen Description KIDNEY LEFT  Final   Special Requests NONE  Final   Gram Stain   Final    ABUNDANT WBC PRESENT, PREDOMINANTLY MONONUCLEAR NO ORGANISMS SEEN    Culture   Final    NO GROWTH < 24 HOURS Performed at [facility]    Report Status PENDING  Incomplete  Body fluid culture     Status: None (Preliminary result)   Collection Time: 05/08/15  4:03 PM  Result Value Ref Range Status   Specimen Description KIDNEY RIGHT  Final   Special Requests NONE  Final   Gram Stain   Final    ABUNDANT WBC PRESENT, PREDOMINANTLY MONONUCLEAR NO ORGANISMS SEEN    Culture   Final    NO GROWTH < 24 HOURS Performed at [facility]    Report Status PENDING  Incomplete     Studies: No results found.  Scheduled Meds: . acidophilus  1 capsule Oral Daily  . amiodarone  200 mg Oral BID  . B-complex with vitamin C  1 tablet Oral Daily  . bisacodyl  10 mg Rectal Once  . cefTRIAXone (ROCEPHIN)   IV  1 g Intravenous Q24H  . diltiazem  240 mg Oral Daily  . pantoprazole  40 mg Oral QHS  . predniSONE  15 mg Oral Q breakfast  . senna-docusate  2 tablet Oral BID  . sodium chloride  3 mL Intravenous Q12H    Continuous Infusions: .  sodium bicarbonate 150 mEq in sterile water 1000 mL infusion 125 mL/hr at 05/10/15 0151     Time spent: 80mins  Craig Dolin MD, PhD  [facility] Pager [phone]. If 7PM-7AM, please contact night-coverage at [email or web address], password [facility] 05/10/2015, 8:43 AM  LOS: 3 days

## 2015-05-10 NOTE — Consult Note (Signed)
Radiation Oncology         [phone]) [phone] ________________________________  Initial inpatient Consultation  Name: Craig Waters. MRN: [id number]  Date: 05/06/2015  DOB: [birth year]  ML:[id number],KCL Marcello Moores, MD  No ref. provider found   REFERRING PHYSICIAN: No ref. provider found  DIAGNOSIS: The primary encounter diagnosis was AKI (acute kidney injury) ([place]). Diagnoses of SBO (small bowel obstruction) ([place]), Epididymitis, Persistent testicular pain, UTI (lower urinary tract infection), Calculus of ureter, Constipation, unspecified constipation type, Renal failure, Urothelial carcinoma ([place]), and Bilateral hydronephrosis were also pertinent to this visit.    ICD-9-CM ICD-10-CM   1. AKI (acute kidney injury) ([place]) 584.9 N17.9   2. SBO (small bowel obstruction) ([facility]) 560.9 K56.69 CT Abdomen Pelvis Wo Contrast     CT Abdomen Pelvis Wo Contrast  3. Epididymitis 604.90 N45.1 [place] Art/Ven Flow Abd Pelv Doppler  4. Persistent testicular pain 608.9 N50.9 [place] Art/Ven Flow Abd Pelv Doppler  5. UTI (lower urinary tract infection) 599.0 N39.0   6. Calculus of ureter 592.1 N20.1   7. Constipation, unspecified constipation type 564.00 K59.00   8. Renal failure 586 N19   9. Urothelial carcinoma ([place]) 199.1 C68.9 Home Health     Face-to-face encounter (required for Medicare/Medicaid patients)  10. Bilateral hydronephrosis 36 N13.30 Home Health     Face-to-face encounter (required for Medicare/Medicaid patients)    HISTORY OF PRESENT ILLNESS::Craig Waters. is a 82 y.o. 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 Gaynelle Arabian. The patient underwent a suprapubic prostatectomy in 2013 for BPH. He was diagnosed with a bladder mass during an admission at [facility] in February 2016. Transurethral resection of  bladder tumor on 07/07/14 identified papillary urothelial carcinoma, high-grade invading the lamina propria and the muscularis propria was present but  negative for carcinoma. The patient was not felt to be a evident for cystectomy and was also not interested in moving forward with this. BCG was also considered but the patient and physician agreed the toxicity of the approach was more than he was interested in proceeding with. Since been followed with repeat cystoscopy. The patient has also been under the care of the hospitalist at [facility] and has been consulted on several occasions during hospitalizations by [facility]. Apparently in October 2016 the patient presented to the ED with hematuria acute kidney injury, and underwent left percutaneous nephrostomy for due to hydronephrosis. The patient stabilized and this was able to be removed. During his hospitalization on 02/15/2015 a CT scan was performed and revealed abnormal lateral wall thickening was noted and pelvic sidewall lymphadenopathy left greater than right up to 1 cm was present. There is also left greater than right retroperitoneal adenopathy, there is also a hypodense lesion of the right lobe of the liver measuring 4.2 cm and concerning for metastatic disease. After this is performed his tumor is felt to be stage II high-grade urothelial carcinoma. He underwent a repeat excision on 03/30/2015 at which time cystourethrogram  was performed with fulguration of 0.5-2 cm lesion, a 2-5 cm lesion, and the lesion greater than 5 cm. Epirubicin 2 mg was instilled in the bladder per Dr. [name]' notes. Also at that time it appears that placement of a left ureteral stent was performed. No specimens were sent to pathology.  Also reviewing [facility] notes, it appears that the patient was being referred to medical oncology for further discussion of adjuvant therapy. Unfortunately, the patient has not yet met with medical  oncology and is admitted currently for acute kidney injury, gross hematuria, and has undergone bilateral percutaneous nephrostomy tubes due to a creatinine with a max creatinine of up  to 5.77, since nephrostomy tubes and hydration, his creatinine is 2.71 with a BUN of 40. A noncontrast CT scan during this admission was performed on 05/07/2015 revealing increase in the size of the hepatic lesion now measuring 5.4 cm, his left ureteral stent was in place, a 5 mm stone was seen adjacent to the stent and similar to prior, mild dilation of the renal pelvis and proximal ureter state despite stent placement was seen, mild right hydroureteronephrosis with similar to prior and ureteral dilation throughout its course hypodense lesions within the right kidney were seen. Small retroperitoneal lymph nodes seen but did not meet criteria to be placed and adenopathy category, O1 centimeter left pelvic sidewall node was present. Marked wall irregularity and thickening was identified again of the urinary bladder. Perivesicular inflammatory change inflammatory change increased in the antrum was also present. We are asked to see patient for consideration of the role of radiotherapy for his cancer.   PREVIOUS RADIATION THERAPY: No  PAST MEDICAL HISTORY:  has a past medical history of Blood in stool; Diverticulitis; GERD (gastroesophageal reflux disease); Glaucoma; Ulcer; History of transfusion of whole blood; Prostate disorder; Shortness of breath (10-18-11); Vertigo (10-18-11); Arthritis (10-18-11); H/O hiatal hernia (10-18-11); Adenomatous colon polyp; Urothelial carcinoma ([place]) (05/2014); PAF (paroxysmal atrial fibrillation) ([place]); Rheumatoid arthritis ([place]); and Discitis of thoracic region (07/04/2012).    PAST SURGICAL HISTORY: Past Surgical History  Procedure Laterality Date  . Gastric ulcer  10-18-11    '91-blleding ulcer with blood transfusions after  . Appendectomy  10-18-11    age 45  . Cataract extraction, bilateral  10-18-11    bilateral   . Eye surgery  10-18-11    laser eye surgery s/p cataract bilaterally  . Cardiac catheterization  10-18-11    7-8 yrs ago-mild blockage,no tx. indicated  .  Prostatectomy  10/27/2011    Procedure: PROSTATECTOMY SUPRAPUBIC;  Surgeon: Ailene Rud, MD;  Location: [facility];  Service: Urology;  Laterality: N/A;  Placement of suprapubic tube  . Cystoscopy  10/27/2011    Procedure: CYSTOSCOPY FLEXIBLE;  Surgeon: Ailene Rud, MD;  Location: [facility];  Service: Urology;  Laterality: N/A;  . Transurethral resection of bladder tumor with gyrus (turbt-gyrus)  06/2014    T1 HG tumor.  [facility].    FAMILY HISTORY: family history includes Breast cancer in his sister; Dementia in his mother; Heart attack in his father. There is no history of Colon cancer.  SOCIAL HISTORY:  Social History   Social History  . Marital Status: Married    Spouse Name: N/A  . Number of Children: N/A  . Years of Education: N/A   Occupational History  . [employer]   Social History Main Topics  . Smoking status: Former Smoker -- 1.00 packs/day for 5 years    Types: Cigarettes    Quit date: 09/26/1978  . Smokeless tobacco: Current User    Types: Chew    Last Attempt to Quit: 11/01/2011  . Alcohol Use: No  . Drug Use: No  . Sexual Activity: Not on file   Other Topics Concern  . Not on file   Social History Narrative   Married, one daughter. One cup of coffee a day. He is semiretired.    ALLERGIES: Review of patient's allergies indicates no known allergies.  MEDICATIONS:  Current Facility-Administered Medications  Medication Dose Route Frequency Provider Last Rate Last Dose  . acetaminophen (TYLENOL) tablet 650 mg  650 mg Oral Q6H PRN Ivor Costa, MD   650 mg at 05/10/15 1257   Or  . acetaminophen (TYLENOL) suppository 650 mg  650 mg Rectal Q6H PRN Ivor Costa, MD      . acidophilus (RISAQUAD) capsule 1 capsule  1 capsule Oral Daily Ivor Costa, MD   1 capsule at 05/10/15 1020  . amiodarone (PACERONE) tablet 200 mg  200 mg Oral BID Ivor Costa, MD   200 mg at 05/10/15 2106  . B-complex with vitamin C tablet 1 tablet  1 tablet  Oral Daily Ivor Costa, MD   1 tablet at 05/10/15 1020  . bisacodyl (DULCOLAX) EC tablet 5 mg  5 mg Oral Daily PRN Ivor Costa, MD   5 mg at 05/09/15 1345  . bisacodyl (DULCOLAX) suppository 10 mg  10 mg Rectal Once Florencia Reasons, MD   10 mg at 05/09/15 1600  . cefTRIAXone (ROCEPHIN) 1 g in dextrose 5 % 50 mL IVPB  1 g Intravenous Q24H Ivor Costa, MD 100 mL/hr at 05/10/15 0225 1 g at 05/10/15 0225  . diltiazem (CARDIZEM CD) 24 hr capsule 240 mg  240 mg Oral Daily Ivor Costa, MD   240 mg at 05/10/15 1020  . hydrALAZINE (APRESOLINE) injection 5 mg  5 mg Intravenous Q2H PRN Ivor Costa, MD      . hyoscyamine (LEVSIN SL) SL tablet 0.125 mg  0.125 mg Oral Q4H PRN Ivor Costa, MD   0.125 mg at 05/10/15 2106  . ondansetron (ZOFRAN) tablet 4 mg  4 mg Oral Q6H PRN Ivor Costa, MD       Or  . ondansetron [facility]) injection 4 mg  4 mg Intravenous Q6H PRN Ivor Costa, MD   4 mg at 05/10/15 1740  . pantoprazole (PROTONIX) EC tablet 40 mg  40 mg Oral QHS Florencia Reasons, MD   40 mg at 05/10/15 2106  . polyethylene glycol (MIRALAX / GLYCOLAX) packet 17 g  17 g Oral BID PRN Ivor Costa, MD   17 g at 05/09/15 2007  . predniSONE (DELTASONE) tablet 15 mg  15 mg Oral Q breakfast Ivor Costa, MD   15 mg at 05/10/15 0809  . promethazine (PHENERGAN) injection 12.5 mg  12.5 mg Intravenous Q6H PRN Carolan Clines, MD   12.5 mg at 05/10/15 2107  . senna-docusate (Senokot-S) tablet 2 tablet  2 tablet Oral BID Florencia Reasons, MD   2 tablet at 05/10/15 2106  . sodium bicarbonate 150 mEq in sterile water 1,000 mL infusion   Intravenous Continuous Roney Jaffe, MD 125 mL/hr at 05/10/15 2048    . sodium chloride 0.9 % injection 3 mL  3 mL Intravenous Q12H Ivor Costa, MD   3 mL at 05/10/15 2106    REVIEW OF SYSTEMS:  The patient reports that he has been experiencing intermittent nausea and abdominal pain. He reports a history of constipation which is not a new symptom for him. He states that he has to take cathartics at times or disimpact himself. He also has a  Foley catheter which appears to be draining light cherry colored urine. His nephrostomy tubes are also putting out clear yellow urine. He describes a sense of severe pain in the testicles but as per history this is been worked up and has been negative based on ultrasound. He denies any chest pain, shortness of breath, fevers or chills.  He denies any unintended recent weight change. Complete review of systems is obtained and is otherwise negative.   PHYSICAL EXAM:  height is 5' 11"  (1.803 m) and weight is 205 lb (92.987 kg). His oral temperature is 98.2 F (36.8 C). His blood pressure is 148/88 and his pulse is 83. His respiration is 18 and oxygen saturation is 96%.   In general this is a chronically ill-appearing Caucasian male in no acute distress. Alert and oriented 4 in appropriate throughout the examination. Cardiovascular exam reveals a regular rate and rhythm no clicks rubs or murmurs auscultated. Chest is clear to auscultation bilaterally and the abdomen is notable for bowel sounds in all 4 quadrants, no palpable hepatosplenomegaly or fascial defects are appreciated. His posterior thorax was evaluated and bilateral nephrostomy tube drains are in place with intact dressings. Lower extremities are negative for pretibial pitting edema bilaterally.  KPS = 70  100 - Normal; no complaints; no evidence of disease. 90   - Able to carry on normal activity; minor signs or symptoms of disease. 80   - Normal activity with effort; some signs or symptoms of disease. 22   - Cares for self; unable to carry on normal activity or to do active work. 60   - Requires occasional assistance, but is able to care for most of his personal needs. 50   - Requires considerable assistance and frequent medical care. 66   - Disabled; requires special care and assistance. 22   - Severely disabled; hospital admission is indicated although death not imminent. 58   - Very sick; hospital admission necessary; active supportive  treatment necessary. 10   - Moribund; fatal processes progressing rapidly. 0     - Dead  Karnofsky DA, Abelmann [place], Craver LS and Burchenal JH [phone]) The use of the nitrogen mustards in the palliative treatment of carcinoma: with particular reference to bronchogenic carcinoma Cancer 1 634-56  LABORATORY DATA:  Lab Results  Component Value Date   WBC 9.6 05/10/2015   HGB 10.1* 05/10/2015   HCT 31.9* 05/10/2015   MCV 90.6 05/10/2015   PLT 242 05/10/2015   Lab Results  Component Value Date   NA 145 05/10/2015   K 4.2 05/10/2015   CL 110 05/10/2015   CO2 25 05/10/2015   Lab Results  Component Value Date   ALT 12* 05/06/2015   AST 18 05/06/2015   ALKPHOS 108 05/06/2015   BILITOT 0.5 05/06/2015     RADIOGRAPHY: Ct Abdomen Pelvis Wo Contrast  05/07/2015  CLINICAL DATA:  Stated history of small bowel obstruction. Patient reports no bowel movement for 2 days, has not passed gas today. Hematuria. EXAM: CT ABDOMEN AND PELVIS WITHOUT CONTRAST TECHNIQUE: Multidetector CT imaging of the abdomen and pelvis was performed following the standard protocol without IV contrast. COMPARISON:  CT 03/14/2015 FINDINGS: Lower chest: Elevation of right hemidiaphragm with adjacent compressive atelectasis in the right middle lobe. Coronary artery calcifications are seen. Liver: Hypodense subcapsular liver lesion involving the right lobe with likely increased in size from prior exam, currently 5.4 cm, previously 4.2 cm. Detailed hepatic evaluation limited secondary to lack of intravenous contrast. Hepatobiliary: Gallbladder physiologically distended, no calcified stone. No biliary dilatation. Pancreas: Fatty atrophy, no ductal dilatation. Spleen: Unremarkable noncontrast appearance. Adrenal glands: No nodule. Kidneys: Left ureteral stent in place. 39m stone is seen adjacent to the stent in the left distal ureter, similar in position to prior. The previous left nephrostomy tube is no longer seen. There is mild  dilatation  of the renal pelvis and proximal ureter despite stent placement. Mild right hydroureteronephrosis is similar to prior, with ureteral dilatation throughout its course hypodense lesions within the right kidney, likely cysts. Stomach/Bowel: Stomach mildly distended with ingested contrast. There are no dilated or thickened small bowel loops. Small bowel loops are decompressed. Moderate stool in the right and transverse colon, small volume of stool in the descending and rectosigmoid colon. Equivocal colonic wall thickening involving the distal sigmoid. Appendix is not seen, surgically absent. Vascular/Lymphatic: Small retroperitoneal lymph nodes, not enlarged by size criteria. Left pelvic sidewall node, 10 mm short axis. Abdominal aorta is normal in caliber. Moderate atherosclerosis without aneurysm. An IVC filter is in place. Reproductive: Post prostatectomy per report. Bladder: Marked wall irregularity and thickening. Minimal urine in the bladder lumen. Perivesicular inflammatory change, mildly increased in the interim. Other: No free air, free fluid, or intra-abdominal fluid collection. Fat within both inguinal canals. Musculoskeletal: There are no acute or suspicious osseous abnormalities. Stable degenerative change in the spine. Anterolisthesis of L3 on L4 with associated degenerative change. No destructive lytic or blastic osseous lesion. IMPRESSION: 1. No bowel obstruction. Low moderate stool burden in the right and transverse colon, with decompressed distal colon. Equivocal wall thickening of the distal sigmoid colon, may reflect minimal colitis in the appropriate clinical setting. 2. Left ureteral stent with minimal residual hydronephrosis and proximal ureter. 5 mm stone in the distal left ureter adjacent to the stent is seen. Unchanged right hydroureteronephrosis. 3. Diffuse bladder wall thickening and irregularity, with minimal increased thin perivesicular inflammation from prior. Small left pelvic  lymph node is unchanged, concerning for nodal disease. 4. Right lobe hepatic lesion has minimally increased in size in the interim, concerning for progression of metastatic disease. Electronically Signed   By: Jeb Levering M.D.   On: 05/07/2015 01:24   US Scrotum  05/07/2015  CLINICAL DATA:  Bilateral testicular tenderness.  Chronic pain. EXAM: SCROTAL ULTRASOUND DOPPLER ULTRASOUND OF THE TESTICLES TECHNIQUE: Complete ultrasound examination of the testicles, epididymis, and other scrotal structures was performed. Color and spectral Doppler ultrasound were also utilized to evaluate blood flow to the testicles. COMPARISON:  None. FINDINGS: Right testicle Measurements: 4.4 x 2.6 x 2.7 cm. No mass or microlithiasis visualized. Left testicle Measurements: 3.8 x 2.7 x 2.8 cm. No mass or microlithiasis visualized. Right epididymis:  Normal in size and appearance. Left epididymis: Small cyst in the epididymal tail measuring 3.4 mm. Normal in size and appearance. Hydrocele:  None visualized. Varicocele:  Mild on the left. Pulsed Doppler interrogation of both testes demonstrates normal low resistance arterial and venous waveforms bilaterally. IMPRESSION: 1. Normal sonographic appearance of the testicles. 2. Small left varicocele.  Tiny left epididymal head cyst. Electronically Signed   By: Jeb Levering M.D.   On: 05/07/2015 02:01   [place] Art/ven Flow Abd Pelv Doppler  05/07/2015  CLINICAL DATA:  Bilateral testicular tenderness.  Chronic pain. EXAM: SCROTAL ULTRASOUND DOPPLER ULTRASOUND OF THE TESTICLES TECHNIQUE: Complete ultrasound examination of the testicles, epididymis, and other scrotal structures was performed. Color and spectral Doppler ultrasound were also utilized to evaluate blood flow to the testicles. COMPARISON:  None. FINDINGS: Right testicle Measurements: 4.4 x 2.6 x 2.7 cm. No mass or microlithiasis visualized. Left testicle Measurements: 3.8 x 2.7 x 2.8 cm. No mass or microlithiasis visualized.  Right epididymis:  Normal in size and appearance. Left epididymis: Small cyst in the epididymal tail measuring 3.4 mm. Normal in size and appearance. Hydrocele:  None visualized. Varicocele:  Mild  on the left. Pulsed Doppler interrogation of both testes demonstrates normal low resistance arterial and venous waveforms bilaterally. IMPRESSION: 1. Normal sonographic appearance of the testicles. 2. Small left varicocele.  Tiny left epididymal head cyst. Electronically Signed   By: Jeb Levering M.D.   On: 05/07/2015 02:01   Ir Nephrostomy Placement Left  05/09/2015  CLINICAL DATA:  Anuric EXAM: IR NEPHROSTOMY PLACEMENT LEFT; IR NEPHROSTOMY PLACEMENT RIGHT FLUOROSCOPY TIME:  Three minutes ANESTHESIA/SEDATION: Moderate sedation time: 30 minutes CONTRAST:  10 cc Omnipaque 300 PROCEDURE: The procedure, risks, benefits, and alternatives were explained to the patient. Questions regarding the procedure were encouraged and answered. The patient understands and consents to the procedure. The back was prepped with ChloraPrep in a sterile fashion, and a sterile drape was applied covering the operative field. A sterile gown and sterile gloves were used for the procedure. Under sonographic guidance, a 21 gauge needle was inserted into a posterior lower pole calyx of the right kidney. Contrast was injected opacifying the collecting system. The needle was removed over a 018 wire which was up sized to a 3 J. A 10 French dilator followed by a 10 [place] nephrostomy or advanced over the wire. It was looped and string fixed within the renal pelvis. It was sewn to the skin. Contrast was injected. Under sonographic guidance, a 21 gauge needle was advanced into the left renal pelvis. Contrast and gas were injected. A second needle was then advanced into the lower pole calyx and removed over a 018 wire which was up sized to a 3 J. A 10 French dilator followed by a 10 [place] drain were inserted. It was looped and string fixed in the renal  pelvis. Contrast was injected. FINDINGS: Imaging documents access into both lower pole collecting systems via 21 gauge needles. Contrast fills the renal collecting systems bilaterally demonstrating hydronephrosis an ureteral obstruction. Final imaging demonstrates bilateral 10 French nephrostomy catheters. COMPLICATIONS: None IMPRESSION: Successful bilateral nephrostomy catheter placement for ureteral obstruction. Electronically Signed   By: Marybelle Killings M.D.   On: 05/09/2015 08:11   Ir Nephrostomy Placement Right  05/09/2015  CLINICAL DATA:  Anuric EXAM: IR NEPHROSTOMY PLACEMENT LEFT; IR NEPHROSTOMY PLACEMENT RIGHT FLUOROSCOPY TIME:  Three minutes ANESTHESIA/SEDATION: Moderate sedation time: 30 minutes CONTRAST:  10 cc Omnipaque 300 PROCEDURE: The procedure, risks, benefits, and alternatives were explained to the patient. Questions regarding the procedure were encouraged and answered. The patient understands and consents to the procedure. The back was prepped with ChloraPrep in a sterile fashion, and a sterile drape was applied covering the operative field. A sterile gown and sterile gloves were used for the procedure. Under sonographic guidance, a 21 gauge needle was inserted into a posterior lower pole calyx of the right kidney. Contrast was injected opacifying the collecting system. The needle was removed over a 018 wire which was up sized to a 3 J. A 10 French dilator followed by a 10 [place] nephrostomy or advanced over the wire. It was looped and string fixed within the renal pelvis. It was sewn to the skin. Contrast was injected. Under sonographic guidance, a 21 gauge needle was advanced into the left renal pelvis. Contrast and gas were injected. A second needle was then advanced into the lower pole calyx and removed over a 018 wire which was up sized to a 3 J. A 10 French dilator followed by a 10 [place] drain were inserted. It was looped and string fixed in the renal pelvis. Contrast was injected.  FINDINGS: Imaging documents  access into both lower pole collecting systems via 21 gauge needles. Contrast fills the renal collecting systems bilaterally demonstrating hydronephrosis an ureteral obstruction. Final imaging demonstrates bilateral 10 French nephrostomy catheters. COMPLICATIONS: None IMPRESSION: Successful bilateral nephrostomy catheter placement for ureteral obstruction. Electronically Signed   By: Marybelle Killings M.D.   On: 05/09/2015 08:11      IMPRESSION: Stage II high-grade urothelial carcinoma of the bladder.  PLAN: Dr. Tammi Klippel has evaluated the patient, and has evaluated his previous records. Based on his current disease status, he would recommend proceeding with palliative radiation therapy to the bladder with a total of  30 Gy over 10 fractions. He discusses that the goals of therapy would be to focus on improving his symptoms particularly hematuria, while trying to also treat persistent tumor. He discusses with the patient and his family, that we would also consider including the areas of adenopathy in her fields. The patient is interested in moving forward with this and we will proceed once he is an outpatient with simulation, and formal planning. We will plan to see him back in the office within the next few days to begin simulation and likely plan to begin radiation next week. Consent will be signed the day of simulation. We also agree with the patient meeting with medical oncology, and we'll coordinate this as an outpatient as well.  Carola Rhine, PAC

## 2015-05-11 DIAGNOSIS — E875 Hyperkalemia: Secondary | ICD-10-CM

## 2015-05-11 LAB — BASIC METABOLIC PANEL
ANION GAP: 10 (ref 5–15)
BUN: 24 mg/dL — ABNORMAL HIGH (ref 6–20)
CALCIUM: 8 mg/dL — AB (ref 8.9–10.3)
CHLORIDE: 104 mmol/L (ref 101–111)
CO2: 30 mmol/L (ref 22–32)
CREATININE: 1.55 mg/dL — AB (ref 0.61–1.24)
GFR calc non Af Amer: 40 mL/min — ABNORMAL LOW (ref >60)
GFR, EST AFRICAN AMERICAN: 46 mL/min — AB (ref >60)
Glucose, Bld: 128 mg/dL — ABNORMAL HIGH (ref 65–99)
Potassium: 3.2 mmol/L — ABNORMAL LOW (ref 3.5–5.1)
SODIUM: 144 mmol/L (ref 135–145)

## 2015-05-11 LAB — GLUCOSE, CAPILLARY: GLUCOSE-CAPILLARY: 98 mg/dL (ref 65–99)

## 2015-05-11 LAB — CBC
HCT: 32.4 % — ABNORMAL LOW (ref 39.0–52.0)
Hemoglobin: 10.1 g/dL — ABNORMAL LOW (ref 13.0–17.0)
MCH: 28.7 pg (ref 26.0–34.0)
MCHC: 31.2 g/dL (ref 30.0–36.0)
MCV: 92 fL (ref 78.0–100.0)
PLATELETS: 247 10*3/uL (ref 150–400)
RBC: 3.52 MIL/uL — AB (ref 4.22–5.81)
RDW: 15 % (ref 11.5–15.5)
WBC: 9.1 10*3/uL (ref 4.0–10.5)

## 2015-05-11 MED ORDER — DILTIAZEM HCL ER COATED BEADS 240 MG PO CP24: 240.0000 mg | ORAL_CAPSULE | Freq: Every day | ORAL | Status: DC

## 2015-05-11 MED ORDER — CEPHALEXIN 500 MG PO CAPS: 500.0000 mg | ORAL_CAPSULE | Freq: Two times a day (BID) | ORAL | Status: DC

## 2015-05-11 MED ORDER — ACETAMINOPHEN-CODEINE #3 300-30 MG PO TABS: 1.0000 | ORAL_TABLET | Freq: Every day | ORAL | Status: DC | PRN

## 2015-05-11 MED ORDER — PROMETHAZINE HCL 12.5 MG PO TABS: 12.5000 mg | ORAL_TABLET | Freq: Four times a day (QID) | ORAL | Status: DC | PRN

## 2015-05-11 MED ORDER — HYOSCYAMINE SULFATE 0.125 MG SL SUBL: 0.1250 mg | SUBLINGUAL_TABLET | SUBLINGUAL | Status: DC | PRN

## 2015-05-11 MED ORDER — RISAQUAD PO CAPS: 1.0000 | ORAL_CAPSULE | Freq: Every day | ORAL | Status: DC

## 2015-05-11 MED ORDER — POTASSIUM CHLORIDE CRYS ER 20 MEQ PO TBCR
40.0000 meq | EXTENDED_RELEASE_TABLET | Freq: Once | ORAL | Status: AC
  Administered 2015-05-11: 40 meq via ORAL
  Filled 2015-05-11: qty 2

## 2015-05-11 MED ORDER — AMIODARONE HCL 200 MG PO TABS: 200.0000 mg | ORAL_TABLET | Freq: Two times a day (BID) | ORAL | Status: DC

## 2015-05-11 NOTE — Progress Notes (Signed)
Went over all discharge information with patient.  All questions answered. Discharge summary given and prescriptions sent to pharmacy.  Foley d/c-pt voided.  [facility] set up per Juliann Pulse, CM.  Will wheel patient out when ride arrives.

## 2015-05-11 NOTE — Care Management Note (Signed)
Case Management Note  Patient Details  Name: Craig Waters. MRN: [id number] Date of Birth: 01-21-1931  Subjective/Objective:   [facility] rep Santiago Glad aware of d/c & HHC orders.                 Action/Plan:d/c home w/HHC.   Expected Discharge Date:   (UNKNOWN)               Expected Discharge Plan:  [place]  In-House Referral:     Discharge planning Services  CM Consult  Post Acute Care Choice:    Choice offered to:  Patient  DME Arranged:    DME Agency:     HH Arranged:  RN, PT George Agency:  [place]  Status of Service:  Completed, signed off  Medicare Important Message Given:  Yes Date Medicare IM Given:    Medicare IM give by:    Date Additional Medicare IM Given:    Additional Medicare Important Message give by:     If discussed at [place] of Stay Meetings, dates discussed:    Additional Comments:  Dessa Phi, RN 05/11/2015, 11:33 AM

## 2015-05-11 NOTE — Progress Notes (Signed)
GU Location of Tumor / Histology: High grade papillary urothelia carcinoma   ACCESSION NUMBER: [id number] RECEIVED: 07/07/2014 ORDERING PHYSICIAN: Caren Macadam , MD PATIENT NAME: Craig Waters SURGICAL PATHOLOGY REPORT  FINAL PATHOLOGIC DIAGNOSIS MICROSCOPIC EXAMINATION AND DIAGNOSIS  BLADDER TUMOR, TRANSURETHRAL RESECTION: Papillary urothelial carcinoma, high-grade. Carcinoma invades the lamina propria. Muscularis propria is present and is negative for carcinoma.  I have personally reviewed the slides and/or other related materials referenced, and have edited the report as part of my pathologic assessment and final interpretation.  Electronically Signed Out By: Porfirio Oar, M.D., Pathology 07/13/2014 16:04:02  Gross Description Received labeled "bladder tumor" are multiple tan-brown soft tissue fragments that measure 2 x 1.5 x 0.4 cm. The specimen entirely submitted in A1.  Past/Anticipated interventions by urology, if any: Urologic hx of: open prostatectomy for BPH, 2013, with urethral stone removal2014. Pt developed gross painless hematuria while visiting at [facility], with Urology evaluation showing bladder cancer. F/u TUR-BT at [facility].   Past/Anticipated interventions by medical oncology, if any: no  Weight changes, if any: no  Bowel/Bladder complaints, if any:  bowel movements with laxatives and persistent nausea despite alternating Zofran and Phenergan. Reports taking Miralax bid, ducolax daily and probiotics daily. Reports he did have a bowel movement yesterday. Urine from perc tubes clear yellow from left tube and blood tinged from right. Denies hematuria from penis. Reports constant perineal pressure and pain.  Nausea/Vomiting, if any: reports persistent nausea despite taking zofran and phenergan. Requesting a different medication to manage nausea. Reports a decreased appetite related to nausea. Patient uncertain if the nausea is related to his pain or bouts of  constipation.   Pain issues, if any:  Reports constant perineal pain for which he alternates Tylenol 3 every four hours with an old hydrocodone prescription. Additionally, patient reports pain associated with rheumatoid arthritis.  SAFETY ISSUES:  Prior radiation? no   Pacemaker/ICD? no  Possible current pregnancy? no  Is the patient on methotrexate? no  Current Complaints / other details:  82 year old male. Reports he was sent home with a five day supply of Keflex to manage UTI and prevent infection from percutaneous nephrostomy tubes. Reports taking prednisone for RA.

## 2015-05-11 NOTE — Discharge Summary (Signed)
Discharge Summary  Craig Waters. LR:[id number] DOB: 12-16-1930  PCP: [name], MD  Admit date: 05/06/2015 Discharge date: 05/11/2015  Time spent: >50mins  Recommendations for Outpatient Follow-up:  1. F/u with PMD within two weeks, repeat cbc/bmp at follow up 2. [facility] Wilhemina Bonito 3. F/u with radiation oncology Dr. Tammi Klippel 4. Discharge home with home health  Discharge Diagnoses:  Active Hospital Problems   Diagnosis Date Noted  . UTI (lower urinary tract infection) 04/05/2015  . PAF (paroxysmal atrial fibrillation) ([place]) 05/07/2015  . Scrotum pain 05/07/2015  . Hematuria   . Acute renal failure superimposed on stage 3 chronic kidney disease ([place]) 03/14/2015  . Hyperkalemia 02/15/2015  . Rheumatoid arthritis ([place]) 02/15/2015  . Urothelial carcinoma ([place]) 02/15/2015  . Bilateral hydronephrosis 02/15/2015  . Left nephrolithiasis 02/15/2015  . Constipation 11/27/2014  . HTN (hypertension) 11/09/2011  . GERD (gastroesophageal reflux disease) 09/26/2010  . PUD (peptic ulcer disease) 09/26/2010  . BPH (benign prostatic hyperplasia) 09/26/2010    Resolved Hospital Problems   Diagnosis Date Noted Date Resolved  No resolved problems to display.    Discharge Condition: stable  Diet recommendation: heart healthy/carb modified  Filed Weights   05/06/15 1454 05/07/15 1658  Weight: 92.987 kg (205 lb) 92.987 kg (205 lb)    History of present illness:  Craig Mcgougan. is a 82 y.o. male with PMH of urothelial cancer (posterior status of surgery), GI bleeding, PUD, diverticulitis, vertigo, PAF not on anticoagulants, rheumatoid arthritis, history of discitis of thoracic spine, chronic kidney disease-3, DVT not on anticoagulants due to hematuria, who presents with hematuria, dysuria and constipation.  Patient reports that she has a chronic intermittent hematuria after he had a surgery for urothelial cancer, which has worsened today. He also has a  dysuria, burning on urination and increased urinary frequency. He does not have fever, chills. He has nausea, but no vomiting or diarrhea. He also has some mild lower abdominal pain. He denies chest pain, shortness of breath, cough, unilateral weakness, bloody stool. Patient also reports having scrotum pain for 2 weeks.  In ED, patient was found to have positive urinalysis for UTI, WBC 13.3, lipase 21, potassium of 5.5 without EKG changes, worsening renal function. Scrotal ultrasound showed normal sonographic appearance of the testicles, small left varicocele,tiny left epididymal head cyst. Patient admitted to inpatient for further eval and treatment. Urology, Dr. Ottis Stain was consulted by EDP.  CT abdomen/pelvis that showed no bowel obstruction, low moderate stool burden in the right and transverse colon, with decompressed distal colon. Equivocal wall thickening of the distal sigmoid colon, may reflect minimal colitis in the appropriate clinical setting. Left ureteral stent with minimal residual hydronephrosis and proximal ureter. 5 mm stone in the distal left ureter adjacent to the stent is seen. Unchanged right hydroureteronephrosis. Diffuse bladder wall thickening and irregularity, with minimal increased thin perivesicular inflammation from prior. Small left pelvic lymph node is unchanged, concerning for nodal disease. Right lobe hepatic lesion has minimally increased in size in the interim, concerning for progression of metastatic disease.  EKG: Independently reviewed. QTC 433, without T-wave peaking.   Hospital Course:  Principal Problem:   UTI (lower urinary tract infection) Active Problems:   BPH (benign prostatic hyperplasia)   PUD (peptic ulcer disease)   GERD (gastroesophageal reflux disease)   HTN (hypertension)   Constipation   Urothelial carcinoma ([facility])   Rheumatoid arthritis ([facility])   Hyperkalemia   Bilateral hydronephrosis   Left nephrolithiasis   Acute renal failure superimposed  on stage 3 chronic kidney disease (HCC)   Hematuria   PAF (paroxysmal atrial fibrillation) ([facility])   Scrotum pain  ARF on CKDIII, with mild hyperkalemia/uremia /acidosis Likely multifactorial including obstructive nephropathy, uti, prerenal, on ivf/abx, to have percutaneous nephrostomy tube placed on 1/14 Resolving, cr  Peaked at 5.72, cr came down to1.5 at discharge Appreciate urology/nephrology/IR Input, patient is cleared to be discharged home by urology, foley removed.  bladder cancer/hematuria:  Status post resection of the tumor at [facility]. Per discharge summary, his urologist said that pt would have hematuria since he has high grade invasive bladder CA, likely incurable. Per his urologist, pt is not a candidate for cystectomy Patient now prefer [facility] Wilhemina Bonito, radiation oncology consulted, plan to start outpatient simulation and XRT, and outpatient oncology follow up.  UTI (lower urinary tract infection):  UA from admission is positive and pt is symptomatic, Patient is not septic on admission, lactic acid level wnl. Hemodynamically stable. - blood culture negative, initial urine culture witn 40000 colonies of group b strep, urine culture from bilateral nephrostomy , no growth,  - received Ceftriaxone since admission, discharged with keflex.  Leukocytosis: wbc 14 on admission, normalized on 1/15  Scrotum pain:  -Likely due to UTI and urothelial cancer. Scrotum US is negative. -Symptomatic treatment with pain medication, negative gc/chlamydia  History of recent DVT: Patient is status post IVC filter placement. Patient is not anticoagulated secondary to ongoing hematuria. No signs of pulmonary embolism  Atrial Fibrillation: CHA2DS2-VASc Score is 3, needs oral anticoagulation, but patient is no on AC at home due to hematuria. Heart rate is well controlled. -continue amiodarone, Cardizem. In NSR since admission.  Constipation; -Fleet enema x  1 -MiraLAX, Senecal  GERD: -Protonix  RA: stable -continue home prednisone. No signs of AI.  HTN (hypertension):  Bp 190/93 initial on admission -Continue Cardizem -IV hydralazine when necessary -better  Elevated fasting blood sugar, likely steroids induced, last a1c 7 in 02/2015, diet control for now, continue pmd follow up.   DVT ppx: none (patient has hematuria, cannot use heparin. Patient has DVT, cannot use SCD)  Code Status: Full code Family Communication: patient Disposition Plan: cleared by urology to discharge home with close follow up   Consultants:  Urology  Interventional radiology  Nephrology  Radiation oncology  Procedures:  Bilateral Percutaneous nephrostomy tube placement on 1/14 by IR  Antibiotics:  Rocephin since admission   Discharge Exam: BP 162/71 mmHg  Pulse 77  Temp(Src) 99.2 F (37.3 C) (Oral)  Resp 18  Ht 5\' 11"  (1.803 m)  Wt 92.987 kg (205 lb)  BMI 28.60 kg/m2  SpO2 95%   General: NAD  Cardiovascular: RRR  Respiratory: CTABL  Abdomen: Soft/ND/NT, positive BS, s/p bilateral nephrostomy tubes placement  Musculoskeletal: pitting Edema bilateral ankle  Neuro: aaox3   Discharge Instructions You were cared for by a hospitalist during your hospital stay. If you have any questions about your discharge medications or the care you received while you were in the hospital after you are discharged, you can call the unit and asked to speak with the hospitalist on call if the hospitalist that took care of you is not available. Once you are discharged, your primary care physician will handle any further medical issues. Please note that NO REFILLS for any discharge medications will be authorized once you are discharged, as it is imperative that you return to your primary care physician (or establish a relationship with a primary care physician if you  do not have one) for your aftercare needs so that they can reassess your need for  medications and monitor your lab values.  Discharge Instructions    Diet - low sodium heart healthy    Complete by:  As directed      Face-to-face encounter (required for Medicare/Medicaid patients)    Complete by:  As directed   I Alecia Doi certify that this patient is under my care and that I, or a nurse practitioner or physician's assistant working with me, had a face-to-face encounter that meets the physician face-to-face encounter requirements with this patient on 05/10/2015. The encounter with the patient was in whole, or in part for the following medical condition(s) which is the primary reason for home health care (List medical condition): FTT, progressive bladder cancer, s/p bilateral nephrostomy tube placement  The encounter with the patient was in whole, or in part, for the following medical condition, which is the primary reason for home health care:  FTt  I certify that, based on my findings, the following services are medically necessary home health services:   Nursing Physical therapy    Reason for Medically Necessary Home Health Services:  Skilled Nursing- Change/Decline in Patient Status  My clinical findings support the need for the above services:  OTHER SEE COMMENTS  Further, I certify that my clinical findings support that this patient is homebound due to:  Immunocompromised     Home Health    Complete by:  As directed   To provide the following care/treatments:   PT RN    S/p bilateral nephrostomy tube placement by interventional radiology, wound care,     Increase activity slowly    Complete by:  As directed             Medication List    STOP taking these medications        acetaminophen 650 MG CR tablet  Commonly known as:  TYLENOL     ciprofloxacin 500 MG tablet  Commonly known as:  CIPRO     multivitamin with minerals Tabs tablet      TAKE these medications        acetaminophen-codeine 300-30 MG tablet  Commonly known as:  TYLENOL #3  Take 1 tablet  by mouth daily as needed for moderate pain.     acidophilus Caps capsule  Take 1 capsule by mouth daily.     amiodarone 200 MG tablet  Commonly known as:  PACERONE  Take 1 tablet (200 mg total) by mouth 2 (two) times daily.     b complex vitamins tablet  Take 1 tablet by mouth daily.     bisacodyl 5 MG EC tablet  Commonly known as:  DULCOLAX  Take 5 mg by mouth daily as needed for moderate constipation.     cephALEXin 500 MG capsule  Commonly known as:  KEFLEX  Take 1 capsule (500 mg total) by mouth 2 (two) times daily.     diltiazem 240 MG 24 hr capsule  Commonly known as:  CARDIZEM CD  Take 1 capsule (240 mg total) by mouth daily.     hyoscyamine 0.125 MG SL tablet  Commonly known as:  LEVSIN SL  Take 0.125 mg by mouth every 4 (four) hours as needed (bladder spasms).     ondansetron 4 MG tablet  Commonly known as:  ZOFRAN  Take 1 tablet (4 mg total) by mouth every 6 (six) hours as needed for nausea.     polyethylene glycol powder powder  Commonly known as:  GLYCOLAX/MIRALAX  Take 255 g by mouth daily.     predniSONE 5 MG tablet  Commonly known as:  DELTASONE  Take 15 mg by mouth daily with breakfast. .     promethazine 12.5 MG tablet  Commonly known as:  PHENERGAN  Take 12.5 mg by mouth every 6 (six) hours as needed for nausea or vomiting.       No Known Allergies     Follow-up Information    Follow up with [name], MD In 2 weeks.   Specialty:  Internal Medicine   Why:  hospital discharge follow up, repeat cbc/bmp at follow up   Contact information:   [address] [place] [address] [phone]       Schedule an appointment as soon as possible for a visit with Ailene Rud, MD.   Specialty:  Urology   Contact information:   [place] [address] [phone]       Schedule an appointment as soon as possible for a visit with Tyler Pita, MD.   Specialty:  Radiation Oncology   Contact  information:   [place] [id number] [phone]        The results of significant diagnostics from this hospitalization (including imaging, microbiology, ancillary and laboratory) are listed below for reference.    Significant Diagnostic Studies: Ct Abdomen Pelvis Wo Contrast  05/07/2015  CLINICAL DATA:  Stated history of small bowel obstruction. Patient reports no bowel movement for 2 days, has not passed gas today. Hematuria. EXAM: CT ABDOMEN AND PELVIS WITHOUT CONTRAST TECHNIQUE: Multidetector CT imaging of the abdomen and pelvis was performed following the standard protocol without IV contrast. COMPARISON:  CT 03/14/2015 FINDINGS: Lower chest: Elevation of right hemidiaphragm with adjacent compressive atelectasis in the right middle lobe. Coronary artery calcifications are seen. Liver: Hypodense subcapsular liver lesion involving the right lobe with likely increased in size from prior exam, currently 5.4 cm, previously 4.2 cm. Detailed hepatic evaluation limited secondary to lack of intravenous contrast. Hepatobiliary: Gallbladder physiologically distended, no calcified stone. No biliary dilatation. Pancreas: Fatty atrophy, no ductal dilatation. Spleen: Unremarkable noncontrast appearance. Adrenal glands: No nodule. Kidneys: Left ureteral stent in place. 2mm stone is seen adjacent to the stent in the left distal ureter, similar in position to prior. The previous left nephrostomy tube is no longer seen. There is mild dilatation of the renal pelvis and proximal ureter despite stent placement. Mild right hydroureteronephrosis is similar to prior, with ureteral dilatation throughout its course hypodense lesions within the right kidney, likely cysts. Stomach/Bowel: Stomach mildly distended with ingested contrast. There are no dilated or thickened small bowel loops. Small bowel loops are decompressed. Moderate stool in the right and transverse colon, small volume of stool in the  descending and rectosigmoid colon. Equivocal colonic wall thickening involving the distal sigmoid. Appendix is not seen, surgically absent. Vascular/Lymphatic: Small retroperitoneal lymph nodes, not enlarged by size criteria. Left pelvic sidewall node, 10 mm short axis. Abdominal aorta is normal in caliber. Moderate atherosclerosis without aneurysm. An IVC filter is in place. Reproductive: Post prostatectomy per report. Bladder: Marked wall irregularity and thickening. Minimal urine in the bladder lumen. Perivesicular inflammatory change, mildly increased in the interim. Other: No free air, free fluid, or intra-abdominal fluid collection. Fat within both inguinal canals. Musculoskeletal: There are no acute or suspicious osseous abnormalities. Stable degenerative change in the spine. Anterolisthesis of L3 on L4 with associated degenerative change. No  destructive lytic or blastic osseous lesion. IMPRESSION: 1. No bowel obstruction. Low moderate stool burden in the right and transverse colon, with decompressed distal colon. Equivocal wall thickening of the distal sigmoid colon, may reflect minimal colitis in the appropriate clinical setting. 2. Left ureteral stent with minimal residual hydronephrosis and proximal ureter. 5 mm stone in the distal left ureter adjacent to the stent is seen. Unchanged right hydroureteronephrosis. 3. Diffuse bladder wall thickening and irregularity, with minimal increased thin perivesicular inflammation from prior. Small left pelvic lymph node is unchanged, concerning for nodal disease. 4. Right lobe hepatic lesion has minimally increased in size in the interim, concerning for progression of metastatic disease. Electronically Signed   By: Jeb Levering M.D.   On: 05/07/2015 01:24   US Scrotum  05/07/2015  CLINICAL DATA:  Bilateral testicular tenderness.  Chronic pain. EXAM: SCROTAL ULTRASOUND DOPPLER ULTRASOUND OF THE TESTICLES TECHNIQUE: Complete ultrasound examination of the  testicles, epididymis, and other scrotal structures was performed. Color and spectral Doppler ultrasound were also utilized to evaluate blood flow to the testicles. COMPARISON:  None. FINDINGS: Right testicle Measurements: 4.4 x 2.6 x 2.7 cm. No mass or microlithiasis visualized. Left testicle Measurements: 3.8 x 2.7 x 2.8 cm. No mass or microlithiasis visualized. Right epididymis:  Normal in size and appearance. Left epididymis: Small cyst in the epididymal tail measuring 3.4 mm. Normal in size and appearance. Hydrocele:  None visualized. Varicocele:  Mild on the left. Pulsed Doppler interrogation of both testes demonstrates normal low resistance arterial and venous waveforms bilaterally. IMPRESSION: 1. Normal sonographic appearance of the testicles. 2. Small left varicocele.  Tiny left epididymal head cyst. Electronically Signed   By: Jeb Levering M.D.   On: 05/07/2015 02:01   [place] Art/ven Flow Abd Pelv Doppler  05/07/2015  CLINICAL DATA:  Bilateral testicular tenderness.  Chronic pain. EXAM: SCROTAL ULTRASOUND DOPPLER ULTRASOUND OF THE TESTICLES TECHNIQUE: Complete ultrasound examination of the testicles, epididymis, and other scrotal structures was performed. Color and spectral Doppler ultrasound were also utilized to evaluate blood flow to the testicles. COMPARISON:  None. FINDINGS: Right testicle Measurements: 4.4 x 2.6 x 2.7 cm. No mass or microlithiasis visualized. Left testicle Measurements: 3.8 x 2.7 x 2.8 cm. No mass or microlithiasis visualized. Right epididymis:  Normal in size and appearance. Left epididymis: Small cyst in the epididymal tail measuring 3.4 mm. Normal in size and appearance. Hydrocele:  None visualized. Varicocele:  Mild on the left. Pulsed Doppler interrogation of both testes demonstrates normal low resistance arterial and venous waveforms bilaterally. IMPRESSION: 1. Normal sonographic appearance of the testicles. 2. Small left varicocele.  Tiny left epididymal head cyst.  Electronically Signed   By: Jeb Levering M.D.   On: 05/07/2015 02:01   Ir Nephrostomy Placement Left  05/09/2015  CLINICAL DATA:  Anuric EXAM: IR NEPHROSTOMY PLACEMENT LEFT; IR NEPHROSTOMY PLACEMENT RIGHT FLUOROSCOPY TIME:  Three minutes ANESTHESIA/SEDATION: Moderate sedation time: 30 minutes CONTRAST:  10 cc Omnipaque 300 PROCEDURE: The procedure, risks, benefits, and alternatives were explained to the patient. Questions regarding the procedure were encouraged and answered. The patient understands and consents to the procedure. The back was prepped with ChloraPrep in a sterile fashion, and a sterile drape was applied covering the operative field. A sterile gown and sterile gloves were used for the procedure. Under sonographic guidance, a 21 gauge needle was inserted into a posterior lower pole calyx of the right kidney. Contrast was injected opacifying the collecting system. The needle was removed over a 018 wire which  was up sized to a 3 J. A 10 French dilator followed by a 10 [place] nephrostomy or advanced over the wire. It was looped and string fixed within the renal pelvis. It was sewn to the skin. Contrast was injected. Under sonographic guidance, a 21 gauge needle was advanced into the left renal pelvis. Contrast and gas were injected. A second needle was then advanced into the lower pole calyx and removed over a 018 wire which was up sized to a 3 J. A 10 French dilator followed by a 10 [place] drain were inserted. It was looped and string fixed in the renal pelvis. Contrast was injected. FINDINGS: Imaging documents access into both lower pole collecting systems via 21 gauge needles. Contrast fills the renal collecting systems bilaterally demonstrating hydronephrosis an ureteral obstruction. Final imaging demonstrates bilateral 10 French nephrostomy catheters. COMPLICATIONS: None IMPRESSION: Successful bilateral nephrostomy catheter placement for ureteral obstruction. Electronically Signed   By: Marybelle Killings M.D.   On: 05/09/2015 08:11   Ir Nephrostomy Placement Right  05/09/2015  CLINICAL DATA:  Anuric EXAM: IR NEPHROSTOMY PLACEMENT LEFT; IR NEPHROSTOMY PLACEMENT RIGHT FLUOROSCOPY TIME:  Three minutes ANESTHESIA/SEDATION: Moderate sedation time: 30 minutes CONTRAST:  10 cc Omnipaque 300 PROCEDURE: The procedure, risks, benefits, and alternatives were explained to the patient. Questions regarding the procedure were encouraged and answered. The patient understands and consents to the procedure. The back was prepped with ChloraPrep in a sterile fashion, and a sterile drape was applied covering the operative field. A sterile gown and sterile gloves were used for the procedure. Under sonographic guidance, a 21 gauge needle was inserted into a posterior lower pole calyx of the right kidney. Contrast was injected opacifying the collecting system. The needle was removed over a 018 wire which was up sized to a 3 J. A 10 French dilator followed by a 10 [place] nephrostomy or advanced over the wire. It was looped and string fixed within the renal pelvis. It was sewn to the skin. Contrast was injected. Under sonographic guidance, a 21 gauge needle was advanced into the left renal pelvis. Contrast and gas were injected. A second needle was then advanced into the lower pole calyx and removed over a 018 wire which was up sized to a 3 J. A 10 French dilator followed by a 10 [place] drain were inserted. It was looped and string fixed in the renal pelvis. Contrast was injected. FINDINGS: Imaging documents access into both lower pole collecting systems via 21 gauge needles. Contrast fills the renal collecting systems bilaterally demonstrating hydronephrosis an ureteral obstruction. Final imaging demonstrates bilateral 10 French nephrostomy catheters. COMPLICATIONS: None IMPRESSION: Successful bilateral nephrostomy catheter placement for ureteral obstruction. Electronically Signed   By: Marybelle Killings M.D.   On: 05/09/2015 08:11     Microbiology: Recent Results (from the past 240 hour(s))  Urine culture     Status: None   Collection Time: 05/06/15 10:58 PM  Result Value Ref Range Status   Specimen Description URINE, CLEAN CATCH  Final   Special Requests NONE  Final   Culture   Final    40,000 COLONIES/ml GROUP B STREP(S.AGALACTIAE)ISOLATED TESTING AGAINST S. AGALACTIAE NOT ROUTINELY PERFORMED DUE TO PREDICTABILITY OF AMP/PEN/VAN SUSCEPTIBILITY. Performed at [facility]    Report Status 05/08/2015 FINAL  Final  Culture, blood (x 2)     Status: None (Preliminary result)   Collection Time: 05/07/15  4:06 AM  Result Value Ref Range Status   Specimen Description BLOOD LEFT ANTECUBITAL  Final  Special Requests BOTTLES DRAWN AEROBIC AND ANAEROBIC 5ML  Final   Culture   Final    NO GROWTH 3 DAYS Performed at [facility]    Report Status PENDING  Incomplete  Culture, blood (x 2)     Status: None (Preliminary result)   Collection Time: 05/07/15  4:30 AM  Result Value Ref Range Status   Specimen Description BLOOD RIGHT FOREARM  Final   Special Requests BOTTLES DRAWN AEROBIC AND ANAEROBIC 5ML  Final   Culture   Final    NO GROWTH 3 DAYS Performed at [facility]    Report Status PENDING  Incomplete  MRSA PCR Screening     Status: None   Collection Time: 05/07/15  5:32 PM  Result Value Ref Range Status   MRSA by PCR NEGATIVE NEGATIVE Final    Comment:        The GeneXpert MRSA Assay (FDA approved for NASAL specimens only), is one component of a comprehensive MRSA colonization surveillance program. It is not intended to diagnose MRSA infection nor to guide or monitor treatment for MRSA infections.   Body fluid culture     Status: None (Preliminary result)   Collection Time: 05/08/15  4:03 PM  Result Value Ref Range Status   Specimen Description KIDNEY LEFT  Final   Special Requests NONE  Final   Gram Stain   Final    ABUNDANT WBC PRESENT, PREDOMINANTLY MONONUCLEAR NO  ORGANISMS SEEN    Culture   Final    NO GROWTH 2 DAYS Performed at [facility]    Report Status PENDING  Incomplete  Body fluid culture     Status: None (Preliminary result)   Collection Time: 05/08/15  4:03 PM  Result Value Ref Range Status   Specimen Description KIDNEY RIGHT  Final   Special Requests NONE  Final   Gram Stain   Final    ABUNDANT WBC PRESENT, PREDOMINANTLY MONONUCLEAR NO ORGANISMS SEEN    Culture   Final    NO GROWTH 2 DAYS Performed at [facility]    Report Status PENDING  Incomplete     Labs: Basic Metabolic Panel:  Recent Labs Lab 05/07/15 1430 05/08/15 0430 05/09/15 0510 05/10/15 0408 05/11/15 0450  NA 133* 135 141 145 144  K 5.0 5.2* 5.0 4.2 3.2*  CL 103 104 111 110 104  CO2 21* 17* 18* 25 30  GLUCOSE 113* 84 137* 117* 128*  BUN 43* 45* 50* 40* 24*  CREATININE 4.66* 5.72* 5.11* 2.71* 1.55*  CALCIUM 8.0* 7.9* 8.3* 8.1* 8.0*   Liver Function Tests:  Recent Labs Lab 05/06/15 1731  AST 18  ALT 12*  ALKPHOS 108  BILITOT 0.5  PROT 7.2  ALBUMIN 3.5    Recent Labs Lab 05/06/15 1731  LIPASE 21   No results for input(s): AMMONIA in the last 168 hours. CBC:  Recent Labs Lab 05/08/15 0430 05/08/15 1841 05/09/15 0510 05/10/15 0408 05/11/15 0450  WBC 12.2* 12.7* 10.2 9.6 9.1  HGB 11.3* 11.4* 10.8* 10.1* 10.1*  HCT 36.6* 36.5* 34.2* 31.9* 32.4*  MCV 93.4 90.8 91.7 90.6 92.0  PLT 219 246 244 242 247   Cardiac Enzymes: No results for input(s): CKTOTAL, CKMB, CKMBINDEX, TROPONINI in the last 168 hours. BNP: BNP (last 3 results)  Recent Labs  02/15/15 1206 03/14/15 1220  BNP 522.9* 137.0*    ProBNP (last 3 results) No results for input(s): PROBNP in the last 8760 hours.  CBG:  Recent Labs  Lab 05/07/15 0756 05/08/15 0740 05/09/15 0756 05/10/15 0738  GLUCAP 106* 77 111* 121*       SignedFlorencia Reasons MD, PhD  [facility] 05/11/2015, 9:34 AM

## 2015-05-11 NOTE — Progress Notes (Signed)
Subjective: Patient reports pain (L) flank from perc tube insertion suture.  [facility] notes reviewed: SIRS criteria 2/2 fever/leukocytosis:  Bladder malignancy 1. AKI (acute kidney injury) ([place]) 584.9 N17.9   2. SBO (small bowel obstruction) (HCC) 560.9 K56.69 CT Abdomen Pelvis Wo Contrast     CT Abdomen Pelvis Wo Contrast  3. Epididymitis 604.90 N45.1 [place] Art/Ven Flow Abd Pelv Doppler  4. Persistent testicular pain 608.9 N50.9 [place] Art/Ven Flow Abd Pelv Doppler  5. UTI (lower urinary tract infection) 599.0 N39.0   6. Calculus of ureter 592.1 N20.1   7. Constipation, unspecified constipation type 564.00 K59.00   8. Renal failure 586 N19   9. Urothelial carcinoma ([place]) 199.1 C68.9 Home Health     Face-to-face encounter (required for Medicare/Medicaid patients)  10. Bilateral hydronephrosis 591 N13.30 Home Health     Face-to-face encounter (required for Medicare/Medicaid patients)        He has been seen by Radiation therapy yesterday. For OP simulation.    Objective: Vital signs in last 24 hours: Temp:  [98.2 F (36.8 C)-99.2 F (37.3 C)] 99.2 F (37.3 C) (01/17 0443) Pulse Rate:  [77-83] 77 (01/17 0443) Resp:  [18] 18 (01/17 0443) BP: (148-162)/(71-88) 162/71 mmHg (01/17 0443) SpO2:  [95 %-96 %] 95 % (01/17 0443)A  Intake/Output from previous day: 01/16 0701 - 01/17 0700 In: 4540.8 [P.O.:600; Craig.V.:3820.8; IV Piggyback:100] Out: [id number] [Urine:4850] Intake/Output this shift:    Past Medical History  Diagnosis Date  . Blood in stool   . Diverticulitis   . GERD (gastroesophageal reflux disease)   . Glaucoma   . Ulcer   . History of transfusion of whole blood   . Prostate disorder   . Shortness of breath 10-18-11    with exertion, cardiac cath done 7-8 yrs ago negative.  . Vertigo 10-18-11    inner ear issues occ. -tx. [place] as needed  . Arthritis 10-18-11    back, fingers  . H/O hiatal hernia 10-18-11    noted on  recent CXR  . Adenomatous colon polyp   . Urothelial carcinoma ([place]) 05/2014  . PAF (paroxysmal atrial fibrillation) ([place])   . Rheumatoid arthritis ([place])   . Discitis of thoracic region 07/04/2012    Physical Exam:  Lungs - Normal respiratory effort, chest expands symmetrically.  Abdomen - Soft, non-tender & non-distended. Flank: perc tubes in place. L painful insertion site Lab Results:  Recent Labs  05/09/15 0510 05/10/15 0408 05/11/15 0450  WBC 10.2 9.6 9.1  HGB 10.8* 10.1* 10.1*  HCT 34.2* 31.9* 32.4*   BMET  Recent Labs  05/10/15 0408 05/11/15 0450  NA 145 144  K 4.2 3.2*  CL 110 104  CO2 25 30  GLUCOSE 117* 128*  BUN 40* 24*  CREATININE 2.71* 1.55*  CALCIUM 8.1* 8.0*   No results for input(s): LABURIN in the last 72 hours. Results for orders placed or performed during the hospital encounter of 05/06/15  Urine culture     Status: None   Collection Time: 05/06/15 10:58 PM  Result Value Ref Range Status   Specimen Description URINE, CLEAN CATCH  Final   Special Requests NONE  Final   Culture   Final    40,000 COLONIES/ml GROUP B STREP(S.AGALACTIAE)ISOLATED TESTING AGAINST S. AGALACTIAE NOT ROUTINELY PERFORMED DUE TO PREDICTABILITY OF AMP/PEN/VAN SUSCEPTIBILITY. Performed at [facility]    Report Status 05/08/2015 FINAL  Final  Culture, blood (x 2)     Status: None (Preliminary result)   Collection Time: 05/07/15  4:06 AM  Result Value Ref Range Status   Specimen Description BLOOD LEFT ANTECUBITAL  Final   Special Requests BOTTLES DRAWN AEROBIC AND ANAEROBIC 5ML  Final   Culture   Final    NO GROWTH 3 DAYS Performed at [facility]    Report Status PENDING  Incomplete  Culture, blood (x 2)     Status: None (Preliminary result)   Collection Time: 05/07/15  4:30 AM  Result Value Ref Range Status   Specimen Description BLOOD RIGHT FOREARM  Final   Special Requests BOTTLES DRAWN AEROBIC AND ANAEROBIC 5ML  Final   Culture   Final    NO  GROWTH 3 DAYS Performed at [facility]    Report Status PENDING  Incomplete  MRSA PCR Screening     Status: None   Collection Time: 05/07/15  5:32 PM  Result Value Ref Range Status   MRSA by PCR NEGATIVE NEGATIVE Final    Comment:        The GeneXpert MRSA Assay (FDA approved for NASAL specimens only), is one component of a comprehensive MRSA colonization surveillance program. It is not intended to diagnose MRSA infection nor to guide or monitor treatment for MRSA infections.   Body fluid culture     Status: None (Preliminary result)   Collection Time: 05/08/15  4:03 PM  Result Value Ref Range Status   Specimen Description KIDNEY LEFT  Final   Special Requests NONE  Final   Gram Stain   Final    ABUNDANT WBC PRESENT, PREDOMINANTLY MONONUCLEAR NO ORGANISMS SEEN    Culture   Final    NO GROWTH 2 DAYS Performed at [facility]    Report Status PENDING  Incomplete  Body fluid culture     Status: None (Preliminary result)   Collection Time: 05/08/15  4:03 PM  Result Value Ref Range Status   Specimen Description KIDNEY RIGHT  Final   Special Requests NONE  Final   Gram Stain   Final    ABUNDANT WBC PRESENT, PREDOMINANTLY MONONUCLEAR NO ORGANISMS SEEN    Culture   Final    NO GROWTH 2 DAYS Performed at [facility]    Report Status PENDING  Incomplete    Studies/Results: No results found.  Assessment: 82 yo male with  High grade invasive TCC, recurrent. He will begin Radiation Rx as OP. He has responded to bilateral perc tubes, except for local pain at Left perc tube insertion site.    He has clear urien today, and has bladder spasms, probably 2ndary to both foley and JJ stent ( obstructed) This will need to be removed at his next cystoscopy).  Plan: 1. Foley out [facility] for d/c when L perc tube is successfully treated.   50 minutes face to face discussion with patient/record review with patient/examination.   Craig Waters Craig Waters 05/11/2015, 7:43  AM

## 2015-05-12 ENCOUNTER — Encounter: Payer: Self-pay | Admitting: Radiation Oncology

## 2015-05-12 ENCOUNTER — Ambulatory Visit
Admission: RE | Admit: 2015-05-12 | Discharge: 2015-05-12 | Disposition: A | Discharge status: Home / Self Care | Payer: Medicare Other | Source: Ambulatory Visit | Attending: Radiation Oncology | Admitting: Radiation Oncology

## 2015-05-12 VITALS — BP 149/76 | HR 89 | Resp 16 | Ht 71.0 in | Wt 205.0 lb

## 2015-05-12 DIAGNOSIS — C679 Malignant neoplasm of bladder, unspecified: Secondary | ICD-10-CM | POA: Insufficient documentation

## 2015-05-12 DIAGNOSIS — R319 Hematuria, unspecified: Secondary | ICD-10-CM | POA: Diagnosis not present

## 2015-05-12 DIAGNOSIS — C689 Malignant neoplasm of urinary organ, unspecified: Secondary | ICD-10-CM

## 2015-05-12 DIAGNOSIS — Z510 Encounter for antineoplastic radiation therapy: Secondary | ICD-10-CM | POA: Insufficient documentation

## 2015-05-12 DIAGNOSIS — C61 Malignant neoplasm of prostate: Secondary | ICD-10-CM

## 2015-05-12 LAB — CULTURE, BLOOD (ROUTINE X 2)
CULTURE: NO GROWTH
Culture: NO GROWTH

## 2015-05-12 LAB — BODY FLUID CULTURE
CULTURE: NO GROWTH
Culture: NO GROWTH

## 2015-05-12 NOTE — Progress Notes (Signed)
Radiation Oncology         [phone] ________________________________  Name: [name] MRN: [id number]  Date: 05/12/2015  DOB: 03-24-1931  Follow-Up Visit Note  CC: [name], MD  Carolan Clines, MD  Diagnosis:   82 yo man with hematuria from stage IV urothelial carcinoma with liver metastasis    ICD-9-CM ICD-10-CM   1. Malignant neoplasm of urinary bladder, unspecified site (HCC) 188.9 C67.9   2. Urothelial carcinoma (HCC) 199.1 C68.9     Interval Since Last Radiation:  N/A  Narrative:  The patient returns today for routine follow-up.  I saw him in the hospital and he returns today to discuss possible radiation therapy.  Since the hospitalization we located the [facility] path report shown below from his first procedure.  The second procedure in December had no associated path report,                                Pathology report from 07/07/14 at [facility] [place]: 07/07/2014  [place] , [place]  PATIENT NAME:Wessman, Ashan  SURGICAL PATHOLOGY REPORT    FINAL PATHOLOGIC DIAGNOSIS  MICROSCOPIC EXAMINATION AND DIAGNOSIS    BLADDER TUMOR, TRANSURETHRAL RESECTION:  Papillary urothelial carcinoma, high-grade.  Carcinoma invades the lamina propria.  Muscularis propria is present and is negative for carcinoma.        I have personally reviewed the slides and/or other related  materials referenced, and have edited the report as part of my  pathologic assessment and final interpretation.    Electronically Signed Out By: Porfirio Oar, M.D., Pathology  07/13/2014 16:04:02    rtm/mqy    Specimen(s) Received  Bladder tumor      Clinical History  Bladder cancer          Gross Description  Received labeled "bladder tumor" are multiple tan-brown soft  tissue fragments that measure 2 x 1.5 x 0.4 cm. The specimen  entirely submitted  in A1.      Kathie Rhodes, M.D.,       ALLERGIES:  has No Known Allergies.  Meds: Current Outpatient Prescriptions  Medication Sig Dispense Refill  . acetaminophen-codeine (TYLENOL #3) 300-30 MG tablet Take 1 tablet by mouth daily as needed for moderate pain. 30 tablet 0  . acidophilus (RISAQUAD) CAPS capsule Take 1 capsule by mouth daily. 30 capsule 0  . amiodarone (PACERONE) 200 MG tablet Take 1 tablet (200 mg total) by mouth 2 (two) times daily. 60 tablet 0  . b complex vitamins tablet Take 1 tablet by mouth daily.    . bisacodyl (DULCOLAX) 5 MG EC tablet Take 5 mg by mouth daily as needed for moderate constipation.    . cephALEXin (KEFLEX) 500 MG capsule Take 1 capsule (500 mg total) by mouth 2 (two) times daily. 10 capsule 0  . diltiazem (CARDIZEM CD) 240 MG 24 hr capsule Take 1 capsule (240 mg total) by mouth daily. 30 capsule 1  . HYDROcodone-acetaminophen (NORCO/VICODIN) 5-325 MG tablet Take 1 tablet by mouth every 6 (six) hours as needed for moderate pain.    . hyoscyamine (LEVSIN SL) 0.125 MG SL tablet Take 1 tablet (0.125 mg total) by mouth every 4 (four) hours as needed (bladder spasms). 30 tablet 0  . ondansetron (ZOFRAN) 4 MG tablet Take 4 mg by mouth every 8 (eight) hours as needed for nausea or vomiting.    . polyethylene  glycol powder (GLYCOLAX/MIRALAX) powder Take 255 g by mouth daily. (Patient taking differently: Take 17 g by mouth 2 (two) times daily as needed (for constipation). ) 500 g 3  . predniSONE (DELTASONE) 5 MG tablet Take 15 mg by mouth daily with breakfast. .    . promethazine (PHENERGAN) 12.5 MG tablet Take 1 tablet (12.5 mg total) by mouth every 6 (six) hours as needed for nausea or vomiting. 30 tablet 0   No current facility-administered medications for this encounter.    Physical Findings: The patient is in no acute distress. Patient is alert and oriented.  height is 5\' 11"  (1.803 m) and weight is 205 lb (92.987 kg). His blood pressure is 149/76  and his pulse is 89. His respiration is 16 and oxygen saturation is 100%. .  No significant changes.  Lab Findings: Lab Results  Component Value Date   WBC 9.1 05/11/2015   HGB 10.1* 05/11/2015   HCT 32.4* 05/11/2015   PLT 247 05/11/2015    Lab Results  Component Value Date   NA 144 05/11/2015   K 3.2* 05/11/2015   CO2 30 05/11/2015   GLUCOSE 128* 05/11/2015   BUN 24* 05/11/2015   CREATININE 1.55* 05/11/2015   BILITOT 0.5 05/06/2015   ALKPHOS 108 05/06/2015   AST 18 05/06/2015   ALT 12* 05/06/2015   PROT 7.2 05/06/2015   ALBUMIN 3.5 05/06/2015   CALCIUM 8.0* 05/11/2015   ANIONGAP 10 05/11/2015    Radiographic Findings: Ct Abdomen Pelvis Wo Contrast  05/07/2015  CLINICAL DATA:  Stated history of small bowel obstruction. Patient reports no bowel movement for 2 days, has not passed gas today. Hematuria. EXAM: CT ABDOMEN AND PELVIS WITHOUT CONTRAST TECHNIQUE: Multidetector CT imaging of the abdomen and pelvis was performed following the standard protocol without IV contrast. COMPARISON:  CT 03/14/2015 FINDINGS: Lower chest: Elevation of right hemidiaphragm with adjacent compressive atelectasis in the right middle lobe. Coronary artery calcifications are seen. Liver: Hypodense subcapsular liver lesion involving the right lobe with likely increased in size from prior exam, currently 5.4 cm, previously 4.2 cm. Detailed hepatic evaluation limited secondary to lack of intravenous contrast. Hepatobiliary: Gallbladder physiologically distended, no calcified stone. No biliary dilatation. Pancreas: Fatty atrophy, no ductal dilatation. Spleen: Unremarkable noncontrast appearance. Adrenal glands: No nodule. Kidneys: Left ureteral stent in place. 69mm stone is seen adjacent to the stent in the left distal ureter, similar in position to prior. The previous left nephrostomy tube is no longer seen. There is mild dilatation of the renal pelvis and proximal ureter despite stent placement. Mild right  hydroureteronephrosis is similar to prior, with ureteral dilatation throughout its course hypodense lesions within the right kidney, likely cysts. Stomach/Bowel: Stomach mildly distended with ingested contrast. There are no dilated or thickened small bowel loops. Small bowel loops are decompressed. Moderate stool in the right and transverse colon, small volume of stool in the descending and rectosigmoid colon. Equivocal colonic wall thickening involving the distal sigmoid. Appendix is not seen, surgically absent. Vascular/Lymphatic: Small retroperitoneal lymph nodes, not enlarged by size criteria. Left pelvic sidewall node, 10 mm short axis. Abdominal aorta is normal in caliber. Moderate atherosclerosis without aneurysm. An IVC filter is in place. Reproductive: Post prostatectomy per report. Bladder: Marked wall irregularity and thickening. Minimal urine in the bladder lumen. Perivesicular inflammatory change, mildly increased in the interim. Other: No free air, free fluid, or intra-abdominal fluid collection. Fat within both inguinal canals. Musculoskeletal: There are no acute or suspicious osseous abnormalities. Stable degenerative change  in the spine. Anterolisthesis of L3 on L4 with associated degenerative change. No destructive lytic or blastic osseous lesion. IMPRESSION: 1. No bowel obstruction. Low moderate stool burden in the right and transverse colon, with decompressed distal colon. Equivocal wall thickening of the distal sigmoid colon, may reflect minimal colitis in the appropriate clinical setting. 2. Left ureteral stent with minimal residual hydronephrosis and proximal ureter. 5 mm stone in the distal left ureter adjacent to the stent is seen. Unchanged right hydroureteronephrosis. 3. Diffuse bladder wall thickening and irregularity, with minimal increased thin perivesicular inflammation from prior. Small left pelvic lymph node is unchanged, concerning for nodal disease. 4. Right lobe hepatic lesion has  minimally increased in size in the interim, concerning for progression of metastatic disease. Electronically Signed   By: Jeb Levering M.D.   On: 05/07/2015 01:24   US Scrotum  05/07/2015  CLINICAL DATA:  Bilateral testicular tenderness.  Chronic pain. EXAM: SCROTAL ULTRASOUND DOPPLER ULTRASOUND OF THE TESTICLES TECHNIQUE: Complete ultrasound examination of the testicles, epididymis, and other scrotal structures was performed. Color and spectral Doppler ultrasound were also utilized to evaluate blood flow to the testicles. COMPARISON:  None. FINDINGS: Right testicle Measurements: 4.4 x 2.6 x 2.7 cm. No mass or microlithiasis visualized. Left testicle Measurements: 3.8 x 2.7 x 2.8 cm. No mass or microlithiasis visualized. Right epididymis:  Normal in size and appearance. Left epididymis: Small cyst in the epididymal tail measuring 3.4 mm. Normal in size and appearance. Hydrocele:  None visualized. Varicocele:  Mild on the left. Pulsed Doppler interrogation of both testes demonstrates normal low resistance arterial and venous waveforms bilaterally. IMPRESSION: 1. Normal sonographic appearance of the testicles. 2. Small left varicocele.  Tiny left epididymal head cyst. Electronically Signed   By: Jeb Levering M.D.   On: 05/07/2015 02:01   [place] Art/ven Flow Abd Pelv Doppler  05/07/2015  CLINICAL DATA:  Bilateral testicular tenderness.  Chronic pain. EXAM: SCROTAL ULTRASOUND DOPPLER ULTRASOUND OF THE TESTICLES TECHNIQUE: Complete ultrasound examination of the testicles, epididymis, and other scrotal structures was performed. Color and spectral Doppler ultrasound were also utilized to evaluate blood flow to the testicles. COMPARISON:  None. FINDINGS: Right testicle Measurements: 4.4 x 2.6 x 2.7 cm. No mass or microlithiasis visualized. Left testicle Measurements: 3.8 x 2.7 x 2.8 cm. No mass or microlithiasis visualized. Right epididymis:  Normal in size and appearance. Left epididymis: Small cyst in the  epididymal tail measuring 3.4 mm. Normal in size and appearance. Hydrocele:  None visualized. Varicocele:  Mild on the left. Pulsed Doppler interrogation of both testes demonstrates normal low resistance arterial and venous waveforms bilaterally. IMPRESSION: 1. Normal sonographic appearance of the testicles. 2. Small left varicocele.  Tiny left epididymal head cyst. Electronically Signed   By: Jeb Levering M.D.   On: 05/07/2015 02:01   Ir Nephrostomy Placement Left  05/09/2015  CLINICAL DATA:  Anuric EXAM: IR NEPHROSTOMY PLACEMENT LEFT; IR NEPHROSTOMY PLACEMENT RIGHT FLUOROSCOPY TIME:  Three minutes ANESTHESIA/SEDATION: Moderate sedation time: 30 minutes CONTRAST:  10 cc Omnipaque 300 PROCEDURE: The procedure, risks, benefits, and alternatives were explained to the patient. Questions regarding the procedure were encouraged and answered. The patient understands and consents to the procedure. The back was prepped with ChloraPrep in a sterile fashion, and a sterile drape was applied covering the operative field. A sterile gown and sterile gloves were used for the procedure. Under sonographic guidance, a 21 gauge needle was inserted into a posterior lower pole calyx of the right kidney. Contrast was injected  opacifying the collecting system. The needle was removed over a 018 wire which was up sized to a 3 J. A 10 French dilator followed by a 10 [place] nephrostomy or advanced over the wire. It was looped and string fixed within the renal pelvis. It was sewn to the skin. Contrast was injected. Under sonographic guidance, a 21 gauge needle was advanced into the left renal pelvis. Contrast and gas were injected. A second needle was then advanced into the lower pole calyx and removed over a 018 wire which was up sized to a 3 J. A 10 French dilator followed by a 10 [place] drain were inserted. It was looped and string fixed in the renal pelvis. Contrast was injected. FINDINGS: Imaging documents access into both lower  pole collecting systems via 21 gauge needles. Contrast fills the renal collecting systems bilaterally demonstrating hydronephrosis an ureteral obstruction. Final imaging demonstrates bilateral 10 French nephrostomy catheters. COMPLICATIONS: None IMPRESSION: Successful bilateral nephrostomy catheter placement for ureteral obstruction. Electronically Signed   By: Marybelle Killings M.D.   On: 05/09/2015 08:11   Ir Nephrostomy Placement Right  05/09/2015  CLINICAL DATA:  Anuric EXAM: IR NEPHROSTOMY PLACEMENT LEFT; IR NEPHROSTOMY PLACEMENT RIGHT FLUOROSCOPY TIME:  Three minutes ANESTHESIA/SEDATION: Moderate sedation time: 30 minutes CONTRAST:  10 cc Omnipaque 300 PROCEDURE: The procedure, risks, benefits, and alternatives were explained to the patient. Questions regarding the procedure were encouraged and answered. The patient understands and consents to the procedure. The back was prepped with ChloraPrep in a sterile fashion, and a sterile drape was applied covering the operative field. A sterile gown and sterile gloves were used for the procedure. Under sonographic guidance, a 21 gauge needle was inserted into a posterior lower pole calyx of the right kidney. Contrast was injected opacifying the collecting system. The needle was removed over a 018 wire which was up sized to a 3 J. A 10 French dilator followed by a 10 [place] nephrostomy or advanced over the wire. It was looped and string fixed within the renal pelvis. It was sewn to the skin. Contrast was injected. Under sonographic guidance, a 21 gauge needle was advanced into the left renal pelvis. Contrast and gas were injected. A second needle was then advanced into the lower pole calyx and removed over a 018 wire which was up sized to a 3 J. A 10 French dilator followed by a 10 [place] drain were inserted. It was looped and string fixed in the renal pelvis. Contrast was injected. FINDINGS: Imaging documents access into both lower pole collecting systems via 21 gauge  needles. Contrast fills the renal collecting systems bilaterally demonstrating hydronephrosis an ureteral obstruction. Final imaging demonstrates bilateral 10 French nephrostomy catheters. COMPLICATIONS: None IMPRESSION: Successful bilateral nephrostomy catheter placement for ureteral obstruction. Electronically Signed   By: Marybelle Killings M.D.   On: 05/09/2015 08:11    Impression:  The patient has hematuria from urothelial carcinoma and may benefit from palliative radiotherapy.  Plan:  Today, I talked to the patient and family about the findings and work-up thus far.  We discussed the natural history of stage IV bladder and general treatment, highlighting the role of radiotherapy in the management of hematuria.  We discussed the available radiation techniques, and focused on the details of logistics and delivery.  We reviewed the anticipated acute and late sequelae associated with radiation in this setting.  The patient was encouraged to ask questions that I answered to the best of my ability.  I filled out a patient counseling  form during our discussion including treatment diagrams.  We retained a copy for our records.  The patient would like to proceed with radiation and will be scheduled for CT simulation.  I spent 60 minutes minutes face to face with the patient and more than 50% of that time was spent in counseling and/or coordination of care.   _____________________________________  Sheral Apley. Tammi Klippel, M.D.

## 2015-05-12 NOTE — Progress Notes (Signed)
See progress note under physician encounter. 

## 2015-05-13 ENCOUNTER — Ambulatory Visit
Admission: RE | Admit: 2015-05-13 | Discharge: 2015-05-13 | Disposition: A | Discharge status: Home / Self Care | Payer: Medicare Other | Source: Ambulatory Visit | Attending: Radiation Oncology | Admitting: Radiation Oncology

## 2015-05-13 DIAGNOSIS — C689 Malignant neoplasm of urinary organ, unspecified: Secondary | ICD-10-CM

## 2015-05-13 DIAGNOSIS — Z510 Encounter for antineoplastic radiation therapy: Secondary | ICD-10-CM | POA: Diagnosis not present

## 2015-05-13 NOTE — Progress Notes (Signed)
  Radiation Oncology         [phone] ________________________________  Name: Craig Waters. MRN: [id number]  Date: 05/13/2015  DOB: [birth year]  SIMULATION AND TREATMENT PLANNING NOTE  DIAGNOSIS:  Mr. Craig Waters. Is a 82 y.o gentleman with urothelial carcinoma for consideration of palliative radiation to the bladder.   NARRATIVE:  The patient was brought to the [facility].  Identity was confirmed.  All relevant records and images related to the planned course of therapy were reviewed.  The patient freely provided informed written consent to proceed with treatment after reviewing the details related to the planned course of therapy. The consent form was witnessed and verified by the simulation staff.  Then, the patient was set-up in a stable reproducible  supine position for radiation therapy.  CT images were obtained.  Surface markings were placed.  The CT images were loaded into the planning software.  Then the target and avoidance structures were contoured.  Treatment planning then occurred.  The radiation prescription was entered and confirmed.  Then, Dr. Tammi Klippel designed and supervised the construction of a total of 5 medically necessary complex treatment devices in the form of an alpha cradle and 4 complex MLCs shielding critical bowel treating the bladder.  Dr. Tammi Klippel has requested : Isodose Plan.  He has ordered:Nutrition Consult  The patient was also in need of dressing changes to his bilateral nephrostomy tubes. [facility] has [facility] who came out yesterday to check his sites. His dressings were taken down but not completely redressed. Without difficulty bilateral nephrostomy sites were evaluated. The pigtail catheters are in place without disturbance, and no erythema or edema was noted. Clear yellow urine was noted in the left bag with minimal debris. His right revealed light pink urine without stones or debris.  Two 4x4 Drain sponges were placed over each  nephrostomy site and fixed with paper tape. The patient will follow up with urology by phone to coordinate additional follow up and was given the contact information for interventional radiology if he is in need of troubleshooting his nephrostomy tubes. Our nursing staff will contact [facility] to also coordinate dressing supplies which can be changed daily prn.  PLAN:  The patient will receive 30 Gy in 10 fractions.  The above documentation reflects my direct findings during this shared patient visit. Please see the separate note by Dr. Tammi Klippel on this date for the remainder of the patient's plan of care.  Carola Rhine, PAC

## 2015-05-14 ENCOUNTER — Telehealth: Payer: Self-pay | Admitting: Radiation Oncology

## 2015-05-14 ENCOUNTER — Other Ambulatory Visit (HOSPITAL_COMMUNITY): Payer: Self-pay | Admitting: Interventional Radiology

## 2015-05-14 ENCOUNTER — Telehealth (HOSPITAL_COMMUNITY): Payer: Self-pay | Admitting: Interventional Radiology

## 2015-05-14 DIAGNOSIS — D494 Neoplasm of unspecified behavior of bladder: Secondary | ICD-10-CM

## 2015-05-14 NOTE — Telephone Encounter (Signed)
Confirmed Wadie Lessen, NP appointment with patient's daughter, [name].

## 2015-05-17 ENCOUNTER — Ambulatory Visit (HOSPITAL_BASED_OUTPATIENT_CLINIC_OR_DEPARTMENT_OTHER)
Admission: RE | Admit: 2015-05-17 | Discharge: 2015-05-17 | Disposition: A | Discharge status: Home / Self Care | Payer: Medicare Other | Source: Ambulatory Visit | Attending: Radiation Oncology | Admitting: Radiation Oncology

## 2015-05-17 DIAGNOSIS — N200 Calculus of kidney: Secondary | ICD-10-CM

## 2015-05-17 DIAGNOSIS — Z515 Encounter for palliative care: Secondary | ICD-10-CM | POA: Diagnosis not present

## 2015-05-17 DIAGNOSIS — K5900 Constipation, unspecified: Secondary | ICD-10-CM

## 2015-05-17 DIAGNOSIS — Z66 Do not resuscitate: Secondary | ICD-10-CM | POA: Diagnosis not present

## 2015-05-17 DIAGNOSIS — G893 Neoplasm related pain (acute) (chronic): Secondary | ICD-10-CM | POA: Diagnosis not present

## 2015-05-17 MED ORDER — ACETAMINOPHEN-CODEINE #3 300-30 MG PO TABS: 1.0000 | ORAL_TABLET | Freq: Three times a day (TID) | ORAL | Status: DC | PRN

## 2015-05-17 MED ORDER — MIRTAZAPINE 7.5 MG PO TABS: 7.5000 mg | ORAL_TABLET | Freq: Every day | ORAL | Status: DC

## 2015-05-17 NOTE — Consult Note (Signed)
Patient ID:Craig Wisniewski Jr.      DOB: 07/30/1930      MRN:[id number]     Consult Note from the Palliative Medicine Team at [facility]    Consult Requested by:     PCP: STONEKING,HAL THOMAS, MD Reason for Consultation              Phone                                                                                                                                Number:[phone]  Assessment of patients Current state:  Consult is for introduction to the concept of Palliative Medicine, clarification of Advanced Directives,  holistic support and symptom management as indicated   Craig Coolman Jr. is a 82 y.o. 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 Tannenbaum. The patient underwent a suprapubic prostatectomy in 2013 for BPH. He was diagnosed with a bladder mass during an admission at [facility] in February 2016. Transurethral resection of bladder tumor on 07/07/14 identified papillary urothelial carcinoma, high-grade invading the lamina propria and the muscularis propria was present but negative for carcinoma.   October 2016 the patient presented to the ED with hematuria acute kidney injury, and underwent left percutaneous nephrostomy for due to hydronephrosis. The patient stabilized and this was able to be removed.   During his hospitalization on 02/15/2015 a CT scan was performed and revealed abnormal lateral wall thickening was noted and pelvic sidewall lymphadenopathy left greater than right up to 1 cm was present. There is also left greater than right retroperitoneal adenopathy, there is also a hypodense lesion of the right lobe of the liver measuring 4.2 cm and concerning for metastatic disease. After this is performed his tumor is felt to be stage II high-grade urothelial carcinoma. He underwent a repeat excision on 03/30/2015 at which time cystourethrogram was performed with fulguration of 0.5-2 cm lesion, a 2-5 cm lesion, and the lesion  greater than 5 cm.  Also at that time it appears that placement of a left ureteral stent was performed. No specimens were sent to pathology. Also reviewing [facility]'s notes, it appears that the patient was being referred to medical oncology for further discussion of adjuvant therapy.  Unfortunately, the patient has not yet met with medical oncology and is admitted currently for acute kidney injury, gross hematuria, and has undergone bilateral percutaneous nephrostomy tubes due to a creatinine with a max creatinine of up to 5.77, since nephrostomy tubes and hydration, his creatinine is 2.71 with a BUN of 40.   A noncontrast CT scan during this admission was performed on 05/07/2015 revealing increase in the size of the hepatic lesion now measuring 5.4 cm, his left ureteral stent was in place, a 5 mm stone was seen adjacent to the stent and similar to prior, mild dilation of the renal pelvis and proximal ureter state despite stent   placement was seen, mild right hydroureteronephrosis with similar to prior and ureteral dilation throughout its course hypodense lesions within the right kidney were seen.   Small retroperitoneal lymph nodes seen but did not meet criteria to be placed and adenopathy category, O1 centimeter left pelvic sidewall node was present. Marked wall irregularity and thickening was identified again of the urinary bladder. Perivesicular inflammatory change inflammatory change increased in the antrum was also present.  Radiation Oncology consulted  for consideration of the role of radiotherapy for his cancer.  Today I meet [name] and his daughter in the OP clinic.  There seems to be much misunderstanding about his current medical conditions and uncertainty from patient himself and his daughter regarding patient and provider expectations   He verbalizes multiple complaints, nausea, vomiting, uncontrolled pain, constipation.  He has had 5 hospital admission with workup over the past 6  months and several ED visits.  Today we are trying to "sort out" and help with "next steps".     A discussion was had today regarding advanced directives.  Concepts specific to code status, artifical feeding and hydration, continued IV antibiotics and rehospitalization was had.  The difference between a aggressive medical intervention path  and a palliative comfort care path for this patient at this time was had.  Values and goals of care important to patient and family were attempted to be elicited.  Concept of Hospice and Palliative Care were discussed   Questions and concerns addressed. Family encouraged to call with questions or concerns.  PMT will continue to support holistically.    Goals of Care: 1.  Code Status:   DNR/DNI-documented today -no artifical feeding now or in the future -MOST form introduced   2. Scope of Treatment:  Patient is open to all  available and offered medical interventions to prolong quality life and most importantly enhance his comfort.  -patient speaks to the pain and suffering he has experienced over the past year, "things just keep getting worse, I don't have many good days"  Comfort and quality is the priority   4. Symptom Management:   Heavy symptom burden   Fatigue: -Pace yourself -Plan your day -Include naps and breaks -schedule a relaxing day -get a little exercise -fuel the body -consider complementary therapies   -deep breathing Pain:  - patient has had a lot of changes attempting to treat his pain with consideration of his renal disease, he tells me he was most recently on Tylenol #3 which was stopped and Tramadol was started which he believes has created significant nausea and vomiting and did not help the pain  Today we will start Oxycodone 5 mg po every 4 hrs prn and reassess on Monday  On f/u visit  Bowel Regimen: Constipation has been ongoing problem and contributing to nausea -Senna-S- two tablets po every night at  bedtime  Educated that if his nausea continues and he hs no relief from constipation to f/u with his PCP or be evaluated in the ER  Nausea/Vomiting:  -continue Zofran 4 mg po every 6 hrs prn  Situational Depression:   - Initiated Remeron 7.5 mg po every night at bedtime  5. Psychosocial:  Emotional support offered to patient and his daughter.  Both are receptive to continue f/u emotional support/ therapy  through this NP and Psych-mental health DNP student      Patient Documents Completed or Given: Document Given Completed  Advanced Directives Pkt    MOST X   DNR      Gone from [facility]    Hard Choices      ROS:  -abdominal pain, nausea, constipation "I only had a small BM two days ago"   PMH:  Past Medical History  Diagnosis Date  . Blood in stool   . Diverticulitis   . GERD (gastroesophageal reflux disease)   . Glaucoma   . Ulcer   . History of transfusion of whole blood   . Prostate disorder   . Shortness of breath 10-18-11    with exertion, cardiac cath done 7-8 yrs ago negative.  . Vertigo 10-18-11    inner ear issues occ. -tx. [facility] as needed  . Arthritis 10-18-11    back, fingers  . H/O hiatal hernia 10-18-11    noted on recent CXR  . Adenomatous colon polyp   . Urothelial carcinoma ([place]) 05/2014  . PAF (paroxysmal atrial fibrillation) ([place])   . Rheumatoid arthritis ([place])   . Discitis of thoracic region 07/04/2012     PSH: Past Surgical History  Procedure Laterality Date  . Gastric ulcer  10-18-11    '91-blleding ulcer with blood transfusions after  . Appendectomy  10-18-11    age 19  . Cataract extraction, bilateral  10-18-11    bilateral   . Eye surgery  10-18-11    laser eye surgery s/p cataract bilaterally  . Cardiac catheterization  10-18-11    7-8 yrs ago-mild blockage,no tx. indicated  . Prostatectomy  10/27/2011    Procedure: PROSTATECTOMY SUPRAPUBIC;  Surgeon: Ailene Rud, MD;  Location: [facility];  Service: Urology;  Laterality: N/A;   Placement of suprapubic tube  . Cystoscopy  10/27/2011    Procedure: CYSTOSCOPY FLEXIBLE;  Surgeon: Ailene Rud, MD;  Location: [facility];  Service: Urology;  Laterality: N/A;  . Transurethral resection of bladder tumor with gyrus (turbt-gyrus)  06/2014    T1 HG tumor.  [facility].   I have reviewed the [facility] and SH and  If appropriate update it with new information. No Known Allergies Scheduled Meds: Continuous Infusions: PRN Meds:.    There were no vitals taken for this visit.   PPS: 40 %  No intake or output data in the 24 hours ending 05/17/15 0838 LBM: only small stool yesterday                       Physical Exam:  General: ill appearing HEENT:  Dry buccal membranes  Labs: CBC    Component Value Date/Time   WBC 9.1 05/11/2015 0450   RBC 3.52* 05/11/2015 0450   HGB 10.1* 05/11/2015 0450   HCT 32.4* 05/11/2015 0450   PLT 247 05/11/2015 0450   MCV 92.0 05/11/2015 0450   MCH 28.7 05/11/2015 0450   MCHC 31.2 05/11/2015 0450   RDW 15.0 05/11/2015 0450   LYMPHSABS 1.3 03/14/2015 1220   MONOABS 0.9 03/14/2015 1220   EOSABS 0.1 03/14/2015 1220   BASOSABS 0.0 03/14/2015 1220    BMET    Component Value Date/Time   NA 144 05/11/2015 0450   K 3.2* 05/11/2015 0450   CL 104 05/11/2015 0450   CO2 30 05/11/2015 0450   GLUCOSE 128* 05/11/2015 0450   BUN 24* 05/11/2015 0450   CREATININE 1.55* 05/11/2015 0450   CALCIUM 8.0* 05/11/2015 0450   GFRNONAA 40* 05/11/2015 0450   GFRAA 46* 05/11/2015 0450    CMP     Component Value Date/Time   NA 144 05/11/2015 0450   K 3.2* 05/11/2015 0450  CL 104 05/11/2015 0450   CO2 30 05/11/2015 0450   GLUCOSE 128* 05/11/2015 0450   BUN 24* 05/11/2015 0450   CREATININE 1.55* 05/11/2015 0450   CALCIUM 8.0* 05/11/2015 0450   PROT 7.2 05/06/2015 1731   ALBUMIN 3.5 05/06/2015 1731   AST 18 05/06/2015 1731   ALT 12* 05/06/2015 1731   ALKPHOS 108 05/06/2015 1731   BILITOT 0.5 05/06/2015 1731   GFRNONAA 40* 05/11/2015 0450    GFRAA 46* 05/11/2015 0450   ECOG PERFORMANCE STATUS* (Eastern Cooperative Oncology Group)  0 Fully active, able to continue with all pre-disease activities without restriction. Pt score  1 Restricted in physically strenuous activity but ambulatory and able to carry out work of a light or sedentary nature, e.g., light house work, office work.   2 Ambulatory and capable of all self-care but unable to carry out any work activities. Up and about more than 50% of waking hours.    3 Capable of only limited self-care. Confined to bed or chair more than 50% of waking hours. 3  4 Completely disabled. Cannot carry on any self-care. Totally confined to bed or chair.   5 Dead.    As published in Am. J. Clin. Oncol.: Eustace Pen, M.M., Colon Flattery., [place], [employer]., [place], Sharen Hint., Drexel Iha, P.P.: Toxicity And Response Criteria Of [facility]. [place] 2:549-826, 1982.  The ECOG Performance Status is in the public domain therefore available for public use. To duplicate the scale, please cite the reference above and credit the [facility], Tyler Pita M.D., Group Chair    Time In Time Out Total Time Spent with Patient Total Overall Time  0845 1005 75 min 80 min    Greater than 50%  of this time was spent counseling and coordinating care related to the above assessment and plan.   Wadie Lessen NP  Palliative Medicine Team Team Phone # [phone] Pager [phone]  Discussed with Joaquim Lai RN/Dr Tammi Klippel

## 2015-05-18 ENCOUNTER — Emergency Department (HOSPITAL_COMMUNITY): Payer: Medicare Other

## 2015-05-18 ENCOUNTER — Inpatient Hospital Stay (HOSPITAL_COMMUNITY)
Admission: EM | Admit: 2015-05-18 | Discharge: 2015-06-02 | DRG: 375 | Disposition: A | Discharge status: Hospice | Payer: Medicare Other | Attending: Internal Medicine | Admitting: Internal Medicine

## 2015-05-18 ENCOUNTER — Encounter (HOSPITAL_COMMUNITY): Payer: Self-pay | Admitting: *Deleted

## 2015-05-18 DIAGNOSIS — D638 Anemia in other chronic diseases classified elsewhere: Secondary | ICD-10-CM | POA: Diagnosis present

## 2015-05-18 DIAGNOSIS — Z66 Do not resuscitate: Secondary | ICD-10-CM | POA: Diagnosis present

## 2015-05-18 DIAGNOSIS — C786 Secondary malignant neoplasm of retroperitoneum and peritoneum: Secondary | ICD-10-CM | POA: Diagnosis present

## 2015-05-18 DIAGNOSIS — K315 Obstruction of duodenum: Secondary | ICD-10-CM | POA: Diagnosis present

## 2015-05-18 DIAGNOSIS — C679 Malignant neoplasm of bladder, unspecified: Secondary | ICD-10-CM | POA: Diagnosis present

## 2015-05-18 DIAGNOSIS — C689 Malignant neoplasm of urinary organ, unspecified: Secondary | ICD-10-CM | POA: Diagnosis not present

## 2015-05-18 DIAGNOSIS — T380X5A Adverse effect of glucocorticoids and synthetic analogues, initial encounter: Secondary | ICD-10-CM | POA: Diagnosis present

## 2015-05-18 DIAGNOSIS — R627 Adult failure to thrive: Secondary | ICD-10-CM | POA: Diagnosis present

## 2015-05-18 DIAGNOSIS — I160 Hypertensive urgency: Secondary | ICD-10-CM | POA: Diagnosis present

## 2015-05-18 DIAGNOSIS — Z86718 Personal history of other venous thrombosis and embolism: Secondary | ICD-10-CM

## 2015-05-18 DIAGNOSIS — N179 Acute kidney failure, unspecified: Secondary | ICD-10-CM | POA: Diagnosis present

## 2015-05-18 DIAGNOSIS — Z9079 Acquired absence of other genital organ(s): Secondary | ICD-10-CM

## 2015-05-18 DIAGNOSIS — C787 Secondary malignant neoplasm of liver and intrahepatic bile duct: Secondary | ICD-10-CM | POA: Diagnosis present

## 2015-05-18 DIAGNOSIS — R140 Abdominal distension (gaseous): Secondary | ICD-10-CM | POA: Diagnosis not present

## 2015-05-18 DIAGNOSIS — Z7952 Long term (current) use of systemic steroids: Secondary | ICD-10-CM | POA: Diagnosis not present

## 2015-05-18 DIAGNOSIS — K311 Adult hypertrophic pyloric stenosis: Secondary | ICD-10-CM | POA: Diagnosis present

## 2015-05-18 DIAGNOSIS — D631 Anemia in chronic kidney disease: Secondary | ICD-10-CM | POA: Diagnosis present

## 2015-05-18 DIAGNOSIS — D630 Anemia in neoplastic disease: Secondary | ICD-10-CM | POA: Diagnosis present

## 2015-05-18 DIAGNOSIS — Z8249 Family history of ischemic heart disease and other diseases of the circulatory system: Secondary | ICD-10-CM | POA: Diagnosis not present

## 2015-05-18 DIAGNOSIS — Z87891 Personal history of nicotine dependence: Secondary | ICD-10-CM | POA: Diagnosis not present

## 2015-05-18 DIAGNOSIS — R040 Epistaxis: Secondary | ICD-10-CM | POA: Diagnosis not present

## 2015-05-18 DIAGNOSIS — N390 Urinary tract infection, site not specified: Secondary | ICD-10-CM | POA: Diagnosis not present

## 2015-05-18 DIAGNOSIS — Z515 Encounter for palliative care: Secondary | ICD-10-CM | POA: Diagnosis present

## 2015-05-18 DIAGNOSIS — N183 Chronic kidney disease, stage 3 unspecified: Secondary | ICD-10-CM | POA: Diagnosis present

## 2015-05-18 DIAGNOSIS — I129 Hypertensive chronic kidney disease with stage 1 through stage 4 chronic kidney disease, or unspecified chronic kidney disease: Secondary | ICD-10-CM | POA: Diagnosis present

## 2015-05-18 DIAGNOSIS — Z6828 Body mass index (BMI) 28.0-28.9, adult: Secondary | ICD-10-CM

## 2015-05-18 DIAGNOSIS — D72829 Elevated white blood cell count, unspecified: Secondary | ICD-10-CM | POA: Diagnosis present

## 2015-05-18 DIAGNOSIS — R52 Pain, unspecified: Secondary | ICD-10-CM | POA: Insufficient documentation

## 2015-05-18 DIAGNOSIS — M069 Rheumatoid arthritis, unspecified: Secondary | ICD-10-CM | POA: Diagnosis present

## 2015-05-18 DIAGNOSIS — Z8711 Personal history of peptic ulcer disease: Secondary | ICD-10-CM

## 2015-05-18 DIAGNOSIS — R933 Abnormal findings on diagnostic imaging of other parts of digestive tract: Secondary | ICD-10-CM | POA: Diagnosis not present

## 2015-05-18 DIAGNOSIS — R319 Hematuria, unspecified: Secondary | ICD-10-CM | POA: Diagnosis present

## 2015-05-18 DIAGNOSIS — G893 Neoplasm related pain (acute) (chronic): Secondary | ICD-10-CM | POA: Diagnosis not present

## 2015-05-18 DIAGNOSIS — E440 Moderate protein-calorie malnutrition: Secondary | ICD-10-CM | POA: Insufficient documentation

## 2015-05-18 DIAGNOSIS — K210 Gastro-esophageal reflux disease with esophagitis: Secondary | ICD-10-CM | POA: Diagnosis present

## 2015-05-18 DIAGNOSIS — E43 Unspecified severe protein-calorie malnutrition: Secondary | ICD-10-CM | POA: Diagnosis present

## 2015-05-18 DIAGNOSIS — Z936 Other artificial openings of urinary tract status: Secondary | ICD-10-CM

## 2015-05-18 DIAGNOSIS — H409 Unspecified glaucoma: Secondary | ICD-10-CM | POA: Diagnosis present

## 2015-05-18 DIAGNOSIS — R112 Nausea with vomiting, unspecified: Secondary | ICD-10-CM

## 2015-05-18 DIAGNOSIS — I480 Paroxysmal atrial fibrillation: Secondary | ICD-10-CM | POA: Diagnosis present

## 2015-05-18 LAB — COMPREHENSIVE METABOLIC PANEL
ALBUMIN: 3.1 g/dL — AB (ref 3.5–5.0)
ALK PHOS: 98 U/L (ref 38–126)
ALT: 19 U/L (ref 17–63)
ANION GAP: 10 (ref 5–15)
AST: 21 U/L (ref 15–41)
BUN: 16 mg/dL (ref 6–20)
CALCIUM: 8.6 mg/dL — AB (ref 8.9–10.3)
CHLORIDE: 101 mmol/L (ref 101–111)
CO2: 26 mmol/L (ref 22–32)
CREATININE: 1.15 mg/dL (ref 0.61–1.24)
GFR calc non Af Amer: 57 mL/min — ABNORMAL LOW (ref >60)
GLUCOSE: 122 mg/dL — AB (ref 65–99)
Potassium: 4.1 mmol/L (ref 3.5–5.1)
SODIUM: 137 mmol/L (ref 135–145)
Total Bilirubin: 0.6 mg/dL (ref 0.3–1.2)
Total Protein: 6.6 g/dL (ref 6.5–8.1)

## 2015-05-18 LAB — URINE MICROSCOPIC-ADD ON: Squamous Epithelial / LPF: NONE SEEN

## 2015-05-18 LAB — CBC
HCT: 39 % (ref 39.0–52.0)
HEMOGLOBIN: 12.2 g/dL — AB (ref 13.0–17.0)
MCH: 28.8 pg (ref 26.0–34.0)
MCHC: 31.3 g/dL (ref 30.0–36.0)
MCV: 92 fL (ref 78.0–100.0)
Platelets: 331 10*3/uL (ref 150–400)
RBC: 4.24 MIL/uL (ref 4.22–5.81)
RDW: 14.9 % (ref 11.5–15.5)
WBC: 14.8 10*3/uL — ABNORMAL HIGH (ref 4.0–10.5)

## 2015-05-18 LAB — URINALYSIS, ROUTINE W REFLEX MICROSCOPIC
BILIRUBIN URINE: NEGATIVE
GLUCOSE, UA: NEGATIVE mg/dL
Ketones, ur: NEGATIVE mg/dL
Nitrite: NEGATIVE
Protein, ur: 100 mg/dL — AB
SPECIFIC GRAVITY, URINE: 1.014 (ref 1.005–1.030)
pH: 8 (ref 5.0–8.0)

## 2015-05-18 LAB — LIPASE, BLOOD: LIPASE: 42 U/L (ref 11–51)

## 2015-05-18 MED ORDER — MIRTAZAPINE 7.5 MG PO TABS: 7.5000 mg | ORAL_TABLET | Freq: Every day | ORAL | Status: DC

## 2015-05-18 MED ORDER — AMIODARONE HCL 200 MG PO TABS: 200.0000 mg | ORAL_TABLET | Freq: Two times a day (BID) | ORAL | Status: DC

## 2015-05-18 MED ORDER — ONDANSETRON HCL 4 MG PO TABS: 4.0000 mg | ORAL_TABLET | Freq: Three times a day (TID) | ORAL | Status: DC | PRN

## 2015-05-18 MED ORDER — FAMOTIDINE 20 MG PO TABS: 10.0000 mg | ORAL_TABLET | Freq: Two times a day (BID) | ORAL | Status: DC

## 2015-05-18 MED ORDER — PREDNISONE 5 MG PO TABS: 5.0000 mg | ORAL_TABLET | Freq: Every day | ORAL | Status: DC

## 2015-05-18 MED ORDER — IOHEXOL 300 MG/ML  SOLN
100.0000 mL | Freq: Once | INTRAMUSCULAR | Status: AC | PRN
  Administered 2015-05-18: 80 mL via INTRAVENOUS

## 2015-05-18 MED ORDER — SODIUM CHLORIDE 0.9 % IV BOLUS (SEPSIS)
1000.0000 mL | Freq: Once | INTRAVENOUS | Status: AC
  Administered 2015-05-18: 1000 mL via INTRAVENOUS

## 2015-05-18 MED ORDER — FENTANYL CITRATE (PF) 100 MCG/2ML IJ SOLN
50.0000 ug | INTRAMUSCULAR | Status: DC | PRN
  Administered 2015-05-18 – 2015-05-19 (×2): 50 ug via INTRAVENOUS
  Filled 2015-05-18 (×2): qty 2

## 2015-05-18 MED ORDER — RISAQUAD PO CAPS: 1.0000 | ORAL_CAPSULE | Freq: Every day | ORAL | Status: DC

## 2015-05-18 MED ORDER — DILTIAZEM HCL ER COATED BEADS 240 MG PO CP24: 240.0000 mg | ORAL_CAPSULE | Freq: Every day | ORAL | Status: DC

## 2015-05-18 MED ORDER — LIDOCAINE HCL 2 % EX GEL
1.0000 "application " | Freq: Once | CUTANEOUS | Status: AC
  Administered 2015-05-19: 1
  Filled 2015-05-18: qty 11

## 2015-05-18 MED ORDER — ONDANSETRON HCL 4 MG/2ML IJ SOLN
4.0000 mg | INTRAMUSCULAR | Status: DC | PRN
  Administered 2015-05-18: 4 mg via INTRAVENOUS
  Filled 2015-05-18 (×2): qty 2

## 2015-05-18 NOTE — ED Notes (Signed)
Bed: [id number] Expected date:  Expected time:  Means of arrival:  Comments: EMS 82 yo male nausea and vomiting

## 2015-05-18 NOTE — ED Provider Notes (Signed)
CSN: [id number]     Arrival date & time 05/18/15  2108 History   First MD Initiated Contact with Patient 05/18/15 2135     Chief Complaint  Patient presents with  . Nausea  . Emesis     (Consider location/radiation/quality/duration/timing/severity/associated sxs/prior Treatment) Patient is a 82 y.o. male presenting with vomiting. The history is provided by the patient.  Emesis Severity:  Moderate Duration:  3 days Timing:  Rare Number of daily episodes:  Dry heaving last 2 days Quality:  Stomach contents Progression:  Worsening Chronicity:  New Recent urination:  Normal Relieved by:  Nothing Worsened by:  Nothing tried Ineffective treatments:  None tried Associated symptoms: abdominal pain   Associated symptoms: no diarrhea   Risk factors comment:  Abdominal mets from bladder cancer   Past Medical History  Diagnosis Date  . Blood in stool   . Diverticulitis   . GERD (gastroesophageal reflux disease)   . Glaucoma   . Ulcer   . History of transfusion of whole blood   . Prostate disorder   . Shortness of breath 10-18-11    with exertion, cardiac cath done 7-8 yrs ago negative.  . Vertigo 10-18-11    inner ear issues occ. -tx. [place] as needed  . Arthritis 10-18-11    back, fingers  . H/O hiatal hernia 10-18-11    noted on recent CXR  . Adenomatous colon polyp   . Urothelial carcinoma ([place]) 05/2014  . PAF (paroxysmal atrial fibrillation) ([place])   . Rheumatoid arthritis ([place])   . Discitis of thoracic region 07/04/2012   Past Surgical History  Procedure Laterality Date  . Gastric ulcer  10-18-11    '91-blleding ulcer with blood transfusions after  . Appendectomy  10-18-11    age 17  . Cataract extraction, bilateral  10-18-11    bilateral   . Eye surgery  10-18-11    laser eye surgery s/p cataract bilaterally  . Cardiac catheterization  10-18-11    7-8 yrs ago-mild blockage,no tx. indicated  . Prostatectomy  10/27/2011    Procedure: PROSTATECTOMY SUPRAPUBIC;  Surgeon:  Ailene Rud, MD;  Location: [facility];  Service: Urology;  Laterality: N/A;  Placement of suprapubic tube  . Cystoscopy  10/27/2011    Procedure: CYSTOSCOPY FLEXIBLE;  Surgeon: Ailene Rud, MD;  Location: [facility];  Service: Urology;  Laterality: N/A;  . Transurethral resection of bladder tumor with gyrus (turbt-gyrus)  06/2014    T1 HG tumor.  [facility].   Family History  Problem Relation Age of Onset  . Breast cancer Sister   . Dementia Mother   . Heart attack Father   . Colon cancer Neg Hx    Social History  Substance Use Topics  . Smoking status: Former Smoker -- 1.00 packs/day for 5 years    Types: Cigarettes    Quit date: 09/26/1978  . Smokeless tobacco: Current User    Types: Chew    Last Attempt to Quit: 11/01/2011  . Alcohol Use: No    Review of Systems  Gastrointestinal: Positive for vomiting and abdominal pain. Negative for diarrhea.  All other systems reviewed and are negative.     Allergies  Review of patient's allergies indicates no known allergies.  Home Medications   Prior to Admission medications   Medication Sig Start Date End Date Taking? Authorizing Provider  acidophilus (RISAQUAD) CAPS capsule Take 1 capsule by mouth daily. 05/11/15  Yes Florencia Reasons, MD  amiodarone (PACERONE) 200 MG tablet Take 1  tablet (200 mg total) by mouth 2 (two) times daily. 05/11/15  Yes Florencia Reasons, MD  b complex vitamins tablet Take 1 tablet by mouth daily.   Yes Historical Provider, MD  diltiazem (CARDIZEM CD) 240 MG 24 hr capsule Take 1 capsule (240 mg total) by mouth daily. 05/11/15  Yes Florencia Reasons, MD  hyoscyamine (LEVSIN SL) 0.125 MG SL tablet Take 1 tablet (0.125 mg total) by mouth every 4 (four) hours as needed (bladder spasms). 05/11/15  Yes Florencia Reasons, MD  ondansetron (ZOFRAN) 4 MG tablet Take 4 mg by mouth every 8 (eight) hours as needed for nausea or vomiting.   Yes Historical Provider, MD  oxycodone (OXY-IR) 5 MG capsule Take 5 mg by mouth every 4 (four) hours as needed.    Yes Knox Royalty, NP  polyethylene glycol powder (GLYCOLAX/MIRALAX) powder Take 255 g by mouth daily. Patient taking differently: Take 17 g by mouth 2 (two) times daily as needed (for constipation).  11/27/14  Yes Willia Craze, NP  predniSONE (DELTASONE) 5 MG tablet Take 5 mg by mouth daily with breakfast. .   Yes Historical Provider, MD  ranitidine (ZANTAC) 75 MG tablet Take 75 mg by mouth 2 (two) times daily. 05/14/15  Yes Historical Provider, MD  sennosides-docusate sodium (SENOKOT-S) 8.6-50 MG tablet Take 2 tablets by mouth at bedtime.   Yes Historical Provider, MD  acetaminophen-codeine (TYLENOL #3) 300-30 MG tablet Take 1 tablet by mouth every 8 (eight) hours as needed for moderate pain. Patient not taking: Reported on 05/18/2015 05/17/15   Knox Royalty, NP  cephALEXin (KEFLEX) 500 MG capsule Take 1 capsule (500 mg total) by mouth 2 (two) times daily. Patient not taking: Reported on 05/18/2015 05/11/15   Florencia Reasons, MD  mirtazapine (REMERON) 7.5 MG tablet Take 1 tablet (7.5 mg total) by mouth at bedtime. 05/17/15   Knox Royalty, NP  promethazine (PHENERGAN) 12.5 MG tablet Take 1 tablet (12.5 mg total) by mouth every 6 (six) hours as needed for nausea or vomiting. Patient not taking: Reported on 05/18/2015 05/11/15   Florencia Reasons, MD   BP 151/87 mmHg  Pulse 88  Temp(Src) 98.3 F (36.8 C) (Oral)  Resp 21  Ht 5\' 11"  (1.803 m)  Wt 205 lb (92.987 kg)  BMI 28.60 kg/m2  SpO2 94% Physical Exam  Constitutional: He is oriented to person, place, and time. He appears well-developed and well-nourished. No distress.  HENT:  Head: Normocephalic and atraumatic.  Eyes: Conjunctivae are normal.  Neck: Neck supple. No tracheal deviation present.  Cardiovascular: Normal rate, regular rhythm and normal heart sounds.   Pulmonary/Chest: Effort normal. No respiratory distress.  Abdominal: Soft. He exhibits distension (right lower and periumbilical). There is tenderness. There is guarding. There is no rebound.   Neurological: He is alert and oriented to person, place, and time.  Skin: Skin is warm and dry.  Psychiatric: He has a normal mood and affect.  Vitals reviewed.   ED Course  Procedures (including critical care time) Labs Review Labs Reviewed  COMPREHENSIVE METABOLIC PANEL - Abnormal; Notable for the following:    Glucose, Bld 122 (*)    Calcium 8.6 (*)    Albumin 3.1 (*)    GFR calc non Af Amer 57 (*)    All other components within normal limits  CBC - Abnormal; Notable for the following:    WBC 14.8 (*)    Hemoglobin 12.2 (*)    All other components within normal limits  URINALYSIS, ROUTINE  W REFLEX MICROSCOPIC (NOT AT [facility]) - Abnormal; Notable for the following:    APPearance CLOUDY (*)    Hgb urine dipstick LARGE (*)    Protein, ur 100 (*)    Leukocytes, UA TRACE (*)    All other components within normal limits  URINE MICROSCOPIC-ADD ON - Abnormal; Notable for the following:    Bacteria, UA MANY (*)    All other components within normal limits  LIPASE, BLOOD    Imaging Review Ct Abdomen Pelvis W Contrast  05/18/2015  CLINICAL DATA:  Right and left lower abdominal pain. Nausea and vomiting since Friday. Leukocytosis. History of hiatal hernia, gastric ulcer, appendectomy, diverticulitis, prostatectomy, bladder cancer, bilateral nephrostomies. EXAM: CT ABDOMEN AND PELVIS WITH CONTRAST TECHNIQUE: Multidetector CT imaging of the abdomen and pelvis was performed using the standard protocol following bolus administration of intravenous contrast. CONTRAST:  33mL OMNIPAQUE IOHEXOL 300 MG/ML  SOLN COMPARISON:  05/06/2015 FINDINGS: The lung bases are clear. Coronary artery and aortic calcifications. There is a heterogeneously enhancing hypoechoic solid mass in the right lobe of the liver inferiorly measuring about 6.8 cm diameter. This was present previously but is better defined on the study with IV contrast material. Appearance is consistent with metastasis. Gallbladder and bile ducts  appear normal. Pancreas, spleen, and adrenal glands are unremarkable. Calcification of the abdominal aorta without aneurysm. Inferior vena caval filter is present. Kidneys demonstrate multiple cysts bilaterally. Bilateral nephrostomy tubes are present with additional left ureteral tube. Renal collecting systems are decompressed. There is scarring or infiltration in the fat of the retroperitoneum surrounding the ureters more prominent on the right. There is extension to the inferior uncinate process of the pancreas. This could represent retroperitoneal fibrosis or neoplastic or inflammatory stranding. Appearances are similar to previous study. The stomach is distended with decompression of the duodenum past the second portion. This is associated with a area of retroperitoneal scarring or infiltration. Gastric outlet obstruction should be excluded. Small bowel and colon are decompressed. No gastric wall thickening appreciated. No free air or free fluid in the abdomen. Abdominal wall musculature appears intact. Pelvis: Bladder is decompressed. There is diffuse asymmetric bladder wall thickening, likely neoplastic. There is loss of fat plane between the anterior bladder wall and the anterior pelvic wall. This could represent direct tumor extension or scarring from previous suprapubic catheter. No significant lymphadenopathy in the pelvis. There appears to been trans urethral resection of the prostate gland. Seminal vesicles are present without enlargement. Degenerative changes in the spine. Slight anterior subluxation of L3 on L4 with associated spondylolysis. No destructive bone lesions. IMPRESSION: There appears to be gastric outlet obstruction with distended fluid-filled stomach to the level of the second portion the duodenum. There is retroperitoneal infiltration or scarring which may be causing gastric outlet obstruction. Duodenal mass or inflammation not excluded. Diffuse retroperitoneal inflammation or scarring  most prominent on the right extending down to the pelvis. Bilateral percutaneous nephrostomy tubes and left ureteral tube. Diffuse asymmetric bladder wall thickening, likely neoplastic. Loss of distinction between the anterior bladder wall and anterior abdominal wall suggests direct infiltration versus scarring. Metastatic lesion in the right lobe of the liver appears unchanged since prior study. Electronically Signed   By: Lucienne Capers M.D.   On: 05/18/2015 23:31   I have personally reviewed and evaluated these images and lab results as part of my medical decision-making.   EKG Interpretation None      MDM   Final diagnoses:  Gastric outlet obstruction  Urothelial carcinoma ([place])  82 year old male presents with lower to mid abdominal pain and distention, feeling of persistent nausea without being able to vomit, loss of appetite. Has history of metastatic abdominal cancer. Has leukocytosis increasing from previous admission so CT scan with contrast is ordered for evaluation of complication. CT demonstrating enlargement of stomach with gastric outlet obstruction secondary to retroperitoneal infiltration of metastases or scarring in the retroperitoneum. Will require inpatient management for decompression of stomach, supportive care and nutritional recommendations. Hospitalist was consulted for admission and will see the patient in the emergency department. Nursing failed NG tube twice, provided small dose of Versed to help tolerate procedure.    Leo Grosser, MD 05/19/15 [phone]

## 2015-05-18 NOTE — ED Notes (Signed)
Patient is from home. Started on Friday experiencing nausea and vomiting. Pt is experiencing right and left lower abd pain. Pt was able to eat a cheeseburger yesterday.

## 2015-05-18 NOTE — Progress Notes (Signed)
Late note from 05/17/15. Wadie Lessen, NP provided the patient with the following handwritten scripts.   Ondansetron 4 mg. Take one tablet by mouth every six hours. Qty 20. No refills.  Remeron 7.5 mg. Take one tablet by mouth at bedtime. Qty 30. No refills. Oxycodone 5 mg. Take one tablet by mouth every four hours prn pain. No refills.  Yellow copies of script given to [facility], Art therapist, to scan in.

## 2015-05-18 NOTE — ED Notes (Signed)
Pt in CT.

## 2015-05-19 ENCOUNTER — Encounter (HOSPITAL_COMMUNITY): Payer: Self-pay | Admitting: *Deleted

## 2015-05-19 ENCOUNTER — Telehealth: Payer: Self-pay | Admitting: Radiation Oncology

## 2015-05-19 MED ORDER — ONDANSETRON HCL 4 MG/2ML IJ SOLN
4.0000 mg | Freq: Four times a day (QID) | INTRAMUSCULAR | Status: DC | PRN
  Administered 2015-05-19: 4 mg via INTRAVENOUS
  Filled 2015-05-19: qty 2

## 2015-05-19 MED ORDER — METHYLPREDNISOLONE SODIUM SUCC 40 MG IJ SOLR
20.0000 mg | Freq: Every day | INTRAMUSCULAR | Status: DC
  Administered 2015-05-19 – 2015-06-02 (×15): 20 mg via INTRAVENOUS
  Filled 2015-05-19 (×15): qty 0.5

## 2015-05-19 MED ORDER — HYDROMORPHONE HCL 1 MG/ML IJ SOLN
0.5000 mg | INTRAMUSCULAR | Status: DC | PRN
  Administered 2015-05-19 (×2): 1 mg via INTRAVENOUS
  Administered 2015-05-19: 0.5 mg via INTRAVENOUS
  Filled 2015-05-19 (×3): qty 1

## 2015-05-19 MED ORDER — HYDROMORPHONE HCL 1 MG/ML IJ SOLN
0.5000 mg | INTRAMUSCULAR | Status: DC | PRN
  Administered 2015-05-19 – 2015-05-24 (×32): 1 mg via INTRAVENOUS
  Filled 2015-05-19 (×31): qty 1

## 2015-05-19 MED ORDER — SODIUM CHLORIDE 0.9 % IV SOLN
INTRAVENOUS | Status: DC
  Administered 2015-05-19 – 2015-06-01 (×16): via INTRAVENOUS

## 2015-05-19 MED ORDER — ENOXAPARIN SODIUM 40 MG/0.4ML ~~LOC~~ SOLN
40.0000 mg | SUBCUTANEOUS | Status: AC
  Administered 2015-05-19 – 2015-05-24 (×6): 40 mg via SUBCUTANEOUS
  Filled 2015-05-19 (×6): qty 0.4

## 2015-05-19 MED ORDER — PANTOPRAZOLE SODIUM 40 MG IV SOLR
40.0000 mg | Freq: Two times a day (BID) | INTRAVENOUS | Status: DC
  Administered 2015-05-19 – 2015-06-02 (×28): 40 mg via INTRAVENOUS
  Filled 2015-05-19 (×28): qty 40

## 2015-05-19 MED ORDER — ONDANSETRON HCL 4 MG/2ML IJ SOLN
4.0000 mg | INTRAMUSCULAR | Status: DC | PRN
  Administered 2015-05-19 – 2015-06-02 (×6): 4 mg via INTRAVENOUS
  Filled 2015-05-19 (×5): qty 2

## 2015-05-19 MED ORDER — MIDAZOLAM HCL 2 MG/2ML IJ SOLN
2.0000 mg | Freq: Once | INTRAMUSCULAR | Status: AC
  Administered 2015-05-19: 2 mg via INTRAVENOUS
  Filled 2015-05-19: qty 2

## 2015-05-19 NOTE — Telephone Encounter (Addendum)
Just received an alarming TELEPHONE ADVICE RECORD. Immediately phoned patient's daughter, Daevion Steffan. She reports her father was admitted to [facility]. Her understanding is that he "has a blockage in his stomach now." She goes onto explain that staff were unsuccessful in getting a NG tube down last night. She states, "he is miserable, I hate seeing him like this, he is being tortured, maybe its time for hospice." [name] understands this RN will contact Wadie Lessen, NP and ensure she is aware the patient was admitted since [facility] consulted with them on Monday. Also, [name] understands this RN will inform Dr. Tammi Klippel of these findings since patient is scheduled for a port and treat tomorrow.

## 2015-05-19 NOTE — Progress Notes (Signed)
Pt seen and examined at bedside, admitted after midnight, please see earlier admission note by Dr. Alcario Drought, admitted for FTT, unable to take PO, CT abd with GOO. Pt with known stage IV bladder cancer metastatic to liver, abdoment. Unable to place NGT in ED. D/W surgery and paged GI as well to see what else can be done.  Faye Ramsay, MD  [facility] Pager [phone]  If 7PM-7AM, please contact night-coverage [email or web address] Password [email or web address]

## 2015-05-19 NOTE — Progress Notes (Signed)
Initial Nutrition Assessment  DOCUMENTATION CODES:   Non-severe (moderate) malnutrition in context of acute illness/injury  INTERVENTION:  - RD will continue to monitor for POC/GOC - Monitor for ability to advance diet versus need for nutrition support  NUTRITION DIAGNOSIS:   Inadequate oral intake related to inability to eat as evidenced by NPO status.  GOAL:   Patient will meet greater than or equal to 90% of their needs  MONITOR:   Weight trends, Labs, Skin, I & O's  REASON FOR ASSESSMENT:   Malnutrition Screening Tool  ASSESSMENT:   82 y.o. male with h/o stage 4 bladder cancer, mets to liver, abdomen. Patient presents to the ED with 3 day history of moderate abdominal pain, N/V, and dry heaving. Unable to tolerate anything PO. Essentially no BM at all during this period. This has been worsening. He has tried nothing for symptoms.  Pt seen for MST. BMI indicates overweight status. Pt has been NPO since admission. Several attempts to place NGT at bedside have been unsuccessful. Talked with pt as he was lying in bed and no one was at bedside. He states that he is miserable and is hoping to receive relief soon from abdominal discomfort. He states that he last ate yesterday (1/2 hamburger) and that this did not cause any increase in nausea or in feelings of abdominal pressure. Pt states that abdominal pressure has been ongoing for several days and has caused associated nausea. He has attempted to vomit to relieve this but has been unsuccessful.  Saw RN note from this AM at [phone]. Will continue to monitor for [place] and plan during hospitalization.  Pt reports UBW of 205 lbs and that he last weighed this 3 weeks ago. Per chart review, pt has lost 3 lbs (1.5% body weight) in the past 1 week which is significant for time frame. Mild muscle and fat wasting noted to upper body; unable to assess lower body.  Not meeting needs. Medications reviewed. Labs reviewed; CBGs: 77-121 mg/dL, Ca:  8.6 mg/dL.    Diet Order:  Diet NPO time specified  Skin:  Reviewed, no issues  Last BM:  1/23  Height:   Ht Readings from Last 1 Encounters:  05/19/15 5\' 11"  (1.803 m)    Weight:   Wt Readings from Last 1 Encounters:  05/19/15 202 lb 6.1 oz (91.8 kg)    Ideal Body Weight:  78.18 kg (kg)  BMI:  Body mass index is 28.24 kg/(m^2).  Estimated Nutritional Needs:   Kcal:  2100-2300  Protein:  85-100 grams  Fluid:  2-2.3 L/day  EDUCATION NEEDS:   No education needs identified at this time     Jarome Matin, RD, LDN Inpatient Clinical Dietitian Pager # [phone] After hours/weekend pager # [phone]

## 2015-05-19 NOTE — Care Management Note (Signed)
Case Management Note  Patient Details  Name: Craig Waters. MRN: [id number] Date of Birth: 02-16-1931  Subjective/Objective: 82 y/o m admitted w/GOO. Readmit-UTI. From home. Active w/[facility] HHRN/HHPT-If needed please resume prior to d/c. GI cons.                  Action/Plan:d/c plan home w/resuming HHRN/PT.   Expected Discharge Date:                  Expected Discharge Plan:  [name]  In-House Referral:     Discharge planning Services  CM Consult  Post Acute Care Choice:  Home Health (Active [facility]-HHRN/PT) Choice offered to:     DME Arranged:    DME Agency:     HH Arranged:    HH Agency:     Status of Service:  In process, will continue to follow  Medicare Important Message Given:    Date Medicare IM Given:    Medicare IM give by:    Date Additional Medicare IM Given:    Additional Medicare Important Message give by:     If discussed at [place] of Stay Meetings, dates discussed:    Additional Comments:  Dessa Phi, RN 05/19/2015, 11:48 AM

## 2015-05-19 NOTE — H&P (Signed)
[facility] History and Physical  Craig Waters. [id number] DOB: 06-18-1930 DOA: 05/18/2015  Referring physician: EDP PCP: [name], MD   Chief Complaint: N/V   HPI: Craig Waters. is a 82 y.o. male with h/o stage 4 bladder cancer, mets to liver, abdomen.  Patient presents to the ED with 3 day history of moderate abdominal pain, N/V, and dry heaving.  Unable to tolerate anything PO.  Essentially no BM at all during this period.  This has been worsening.  He has tried nothing for symptoms.  Review of Systems: Systems reviewed.  As above, otherwise negative  Past Medical History  Diagnosis Date  . Blood in stool   . Diverticulitis   . GERD (gastroesophageal reflux disease)   . Glaucoma   . Ulcer   . History of transfusion of whole blood   . Prostate disorder   . Shortness of breath 10-18-11    with exertion, cardiac cath done 7-8 yrs ago negative.  . Vertigo 10-18-11    inner ear issues occ. -tx. [place] as needed  . Arthritis 10-18-11    back, fingers  . H/O hiatal hernia 10-18-11    noted on recent CXR  . Adenomatous colon polyp   . Urothelial carcinoma ([place]) 05/2014  . PAF (paroxysmal atrial fibrillation) ([place])   . Rheumatoid arthritis ([place])   . Discitis of thoracic region 07/04/2012   Past Surgical History  Procedure Laterality Date  . Gastric ulcer  10-18-11    '91-blleding ulcer with blood transfusions after  . Appendectomy  10-18-11    age 38  . Cataract extraction, bilateral  10-18-11    bilateral   . Eye surgery  10-18-11    laser eye surgery s/p cataract bilaterally  . Cardiac catheterization  10-18-11    7-8 yrs ago-mild blockage,no tx. indicated  . Prostatectomy  10/27/2011    Procedure: PROSTATECTOMY SUPRAPUBIC;  Surgeon: Ailene Rud, MD;  Location: [facility] ORS;  Service: Urology;  Laterality: N/A;  Placement of suprapubic tube  . Cystoscopy  10/27/2011    Procedure: CYSTOSCOPY FLEXIBLE;  Surgeon: Ailene Rud, MD;  Location: [facility]  ORS;  Service: Urology;  Laterality: N/A;  . Transurethral resection of bladder tumor with gyrus (turbt-gyrus)  06/2014    T1 HG tumor.  [facility].   Social History:  reports that he quit smoking about 36 years ago. His smoking use included Cigarettes. He has a 5 pack-year smoking history. His smokeless tobacco use includes Chew. He reports that he does not drink alcohol or use illicit drugs.  No Known Allergies  Family History  Problem Relation Age of Onset  . Breast cancer Sister   . Dementia Mother   . Heart attack Father   . Colon cancer Neg Hx      Prior to Admission medications   Medication Sig Start Date End Date Taking? Authorizing Provider  acidophilus (RISAQUAD) CAPS capsule Take 1 capsule by mouth daily. 05/11/15  Yes Florencia Reasons, MD  amiodarone (PACERONE) 200 MG tablet Take 1 tablet (200 mg total) by mouth 2 (two) times daily. 05/11/15  Yes Florencia Reasons, MD  b complex vitamins tablet Take 1 tablet by mouth daily.   Yes Historical Provider, MD  diltiazem (CARDIZEM CD) 240 MG 24 hr capsule Take 1 capsule (240 mg total) by mouth daily. 05/11/15  Yes Florencia Reasons, MD  hyoscyamine (LEVSIN SL) 0.125 MG SL tablet Take 1 tablet (0.125 mg total) by mouth every 4 (four) hours as needed (bladder  spasms). 05/11/15  Yes Florencia Reasons, MD  ondansetron (ZOFRAN) 4 MG tablet Take 4 mg by mouth every 8 (eight) hours as needed for nausea or vomiting.   Yes Historical Provider, MD  oxycodone (OXY-IR) 5 MG capsule Take 5 mg by mouth every 4 (four) hours as needed.   Yes Knox Royalty, NP  polyethylene glycol powder (GLYCOLAX/MIRALAX) powder Take 255 g by mouth daily. Patient taking differently: Take 17 g by mouth 2 (two) times daily as needed (for constipation).  11/27/14  Yes Willia Craze, NP  predniSONE (DELTASONE) 5 MG tablet Take 5 mg by mouth daily with breakfast. .   Yes Historical Provider, MD  ranitidine (ZANTAC) 75 MG tablet Take 75 mg by mouth 2 (two) times daily. 05/14/15  Yes Historical Provider, MD   sennosides-docusate sodium (SENOKOT-S) 8.6-50 MG tablet Take 2 tablets by mouth at bedtime.   Yes Historical Provider, MD  mirtazapine (REMERON) 7.5 MG tablet Take 1 tablet (7.5 mg total) by mouth at bedtime. 05/17/15   Knox Royalty, NP   Physical Exam: Filed Vitals:   05/18/15 2230 05/18/15 2330  BP: 172/92 177/89  Pulse: 81 87  Temp:  98.4 F (36.9 C)  Resp: 19 23    BP 177/89 mmHg  Pulse 87  Temp(Src) 98.4 F (36.9 C) (Oral)  Resp 23  Ht 5\' 11"  (1.803 m)  Wt 92.987 kg (205 lb)  BMI 28.60 kg/m2  SpO2 94%  General Appearance:    Alert, oriented, no distress, appears stated age  Head:    Normocephalic, atraumatic  Eyes:    PERRL, EOMI, sclera non-icteric        Nose:   Nares without drainage or epistaxis. Mucosa, turbinates normal  Throat:   Moist mucous membranes. Oropharynx without erythema or exudate.  Neck:   Supple. No carotid bruits.  No thyromegaly.  No lymphadenopathy.   Back:     No CVA tenderness, no spinal tenderness  Lungs:     Clear to auscultation bilaterally, without wheezes, rhonchi or rales  Chest wall:    No tenderness to palpitation  Heart:    Regular rate and rhythm without murmurs, gallops, rubs  Abdomen:     Soft, non-tender, nondistended, normal bowel sounds, no organomegaly  Genitalia:    deferred  Rectal:    deferred  Extremities:   No clubbing, cyanosis or edema.  Pulses:   2+ and symmetric all extremities  Skin:   Skin color, texture, turgor normal, no rashes or lesions  Lymph nodes:   Cervical, supraclavicular, and axillary nodes normal  Neurologic:   CNII-XII intact. Normal strength, sensation and reflexes      throughout    Labs on Admission:  Basic Metabolic Panel:  Recent Labs Lab 05/18/15 2140  NA 137  K 4.1  CL 101  CO2 26  GLUCOSE 122*  BUN 16  CREATININE 1.15  CALCIUM 8.6*   Liver Function Tests:  Recent Labs Lab 05/18/15 2140  AST 21  ALT 19  ALKPHOS 98  BILITOT 0.6  PROT 6.6  ALBUMIN 3.1*    Recent  Labs Lab 05/18/15 2140  LIPASE 42   No results for input(s): AMMONIA in the last 168 hours. CBC:  Recent Labs Lab 05/18/15 2140  WBC 14.8*  HGB 12.2*  HCT 39.0  MCV 92.0  PLT 331   Cardiac Enzymes: No results for input(s): CKTOTAL, CKMB, CKMBINDEX, TROPONINI in the last 168 hours.  BNP (last 3 results) No results for input(s): PROBNP  in the last 8760 hours. CBG: No results for input(s): GLUCAP in the last 168 hours.  Radiological Exams on Admission: Ct Abdomen Pelvis W Contrast  05/18/2015  CLINICAL DATA:  Right and left lower abdominal pain. Nausea and vomiting since Friday. Leukocytosis. History of hiatal hernia, gastric ulcer, appendectomy, diverticulitis, prostatectomy, bladder cancer, bilateral nephrostomies. EXAM: CT ABDOMEN AND PELVIS WITH CONTRAST TECHNIQUE: Multidetector CT imaging of the abdomen and pelvis was performed using the standard protocol following bolus administration of intravenous contrast. CONTRAST:  80mL OMNIPAQUE IOHEXOL 300 MG/ML  SOLN COMPARISON:  05/06/2015 FINDINGS: The lung bases are clear. Coronary artery and aortic calcifications. There is a heterogeneously enhancing hypoechoic solid mass in the right lobe of the liver inferiorly measuring about 6.8 cm diameter. This was present previously but is better defined on the study with IV contrast material. Appearance is consistent with metastasis. Gallbladder and bile ducts appear normal. Pancreas, spleen, and adrenal glands are unremarkable. Calcification of the abdominal aorta without aneurysm. Inferior vena caval filter is present. Kidneys demonstrate multiple cysts bilaterally. Bilateral nephrostomy tubes are present with additional left ureteral tube. Renal collecting systems are decompressed. There is scarring or infiltration in the fat of the retroperitoneum surrounding the ureters more prominent on the right. There is extension to the inferior uncinate process of the pancreas. This could represent  retroperitoneal fibrosis or neoplastic or inflammatory stranding. Appearances are similar to previous study. The stomach is distended with decompression of the duodenum past the second portion. This is associated with a area of retroperitoneal scarring or infiltration. Gastric outlet obstruction should be excluded. Small bowel and colon are decompressed. No gastric wall thickening appreciated. No free air or free fluid in the abdomen. Abdominal wall musculature appears intact. Pelvis: Bladder is decompressed. There is diffuse asymmetric bladder wall thickening, likely neoplastic. There is loss of fat plane between the anterior bladder wall and the anterior pelvic wall. This could represent direct tumor extension or scarring from previous suprapubic catheter. No significant lymphadenopathy in the pelvis. There appears to been trans urethral resection of the prostate gland. Seminal vesicles are present without enlargement. Degenerative changes in the spine. Slight anterior subluxation of L3 on L4 with associated spondylolysis. No destructive bone lesions. IMPRESSION: There appears to be gastric outlet obstruction with distended fluid-filled stomach to the level of the second portion the duodenum. There is retroperitoneal infiltration or scarring which may be causing gastric outlet obstruction. Duodenal mass or inflammation not excluded. Diffuse retroperitoneal inflammation or scarring most prominent on the right extending down to the pelvis. Bilateral percutaneous nephrostomy tubes and left ureteral tube. Diffuse asymmetric bladder wall thickening, likely neoplastic. Loss of distinction between the anterior bladder wall and anterior abdominal wall suggests direct infiltration versus scarring. Metastatic lesion in the right lobe of the liver appears unchanged since prior study. Electronically Signed   By: Lucienne Capers M.D.   On: 05/18/2015 23:31    EKG: Independently reviewed.  Assessment/Plan Principal  Problem:   Gastric outlet obstruction Active Problems:   Urothelial carcinoma ([place])   1. Gastric outlet obstruction - 1. IVF 2. NPO 1. Converted daily prednisone 5mg  to solumedrol 20mg  IV daily 2. Pharmacy is converting Cardizem - they say just don't give Cardizem IV boluses and instead monitor and if patient goes into A.Fib RVR then use Cardizem gtt 1. Will put patient on tele monitor 3. Holding amiodarone for the moment (has very long half life) 3. Multiple attempts to place NGT in ED have only resulted in the patient having  epistaxis and increasing suffering, will hold of for now, plan for GI consult in AM for NGT placement. 4. May wish to get surgery consult, but I strongly suspect they will not go after initial operative management in a patient with obstruction at the 2nd part of the duodenum and metastatic cancer in the abdomen. 5. Zofran IV prn nausea 6. Dilaudid IV prn pain 2. Bladder cancer - follows with rad onc here, does not appear to be on chemo at this point so oncology consult not placed at this time.    Code Status: DNI/DNR established with palliative care. Family Communication: Family at bedside Disposition Plan: Admit to inpatient   Time spent: 64 min  GARDNER, JARED M. [facility] Pager [phone]  If 7AM-7PM, please contact the day team taking care of the patient [email or web address] Password [email or web address] 05/19/2015, 12:48 AM

## 2015-05-19 NOTE — ED Notes (Signed)
Attempted NG tube once down each nostril Pt unable to tolerate procedure. Will notify provider.

## 2015-05-19 NOTE — Telephone Encounter (Signed)
Phoned patient's daughter, [name]. Explained her father's port and treatment has been pushed out until Monday, January 30th. Explained Dr. Tammi Klippel and Wadie Lessen, NP are aware of his admission and current status. Then, informed Melissa, RT on L2 of these findings.

## 2015-05-19 NOTE — ED Notes (Signed)
Attempted another time to place an NG tube after the VERSED was given to the left nostral. Pt was calmer and very cooperative for the procedure. Unsuccessful attempt. Notified Dr. Alcario Drought of this information. He reports he will notify GI in the AM. Informed patient of the plan of treatment.

## 2015-05-20 ENCOUNTER — Encounter (HOSPITAL_COMMUNITY): Payer: Self-pay | Admitting: Anesthesiology

## 2015-05-20 ENCOUNTER — Inpatient Hospital Stay (HOSPITAL_COMMUNITY): Payer: Medicare Other | Admitting: Anesthesiology

## 2015-05-20 ENCOUNTER — Encounter (HOSPITAL_COMMUNITY)
Admission: EM | Disposition: A | Discharge status: Hospice | Payer: Self-pay | Source: Home / Self Care | Attending: Internal Medicine

## 2015-05-20 ENCOUNTER — Ambulatory Visit: Payer: Medicare Other | Admitting: Radiation Oncology

## 2015-05-20 DIAGNOSIS — R933 Abnormal findings on diagnostic imaging of other parts of digestive tract: Secondary | ICD-10-CM

## 2015-05-20 DIAGNOSIS — Z515 Encounter for palliative care: Secondary | ICD-10-CM

## 2015-05-20 DIAGNOSIS — E440 Moderate protein-calorie malnutrition: Secondary | ICD-10-CM

## 2015-05-20 DIAGNOSIS — K311 Adult hypertrophic pyloric stenosis: Secondary | ICD-10-CM

## 2015-05-20 DIAGNOSIS — Z66 Do not resuscitate: Secondary | ICD-10-CM

## 2015-05-20 HISTORY — PX: ESOPHAGOGASTRODUODENOSCOPY (EGD) WITH PROPOFOL: SHX5813

## 2015-05-20 LAB — BASIC METABOLIC PANEL
Anion gap: 9 (ref 5–15)
BUN: 17 mg/dL (ref 6–20)
CHLORIDE: 108 mmol/L (ref 101–111)
CO2: 22 mmol/L (ref 22–32)
Calcium: 8.3 mg/dL — ABNORMAL LOW (ref 8.9–10.3)
Creatinine, Ser: 0.98 mg/dL (ref 0.61–1.24)
GFR calc Af Amer: >60 mL/min (ref >60)
GFR calc non Af Amer: >60 mL/min (ref >60)
GLUCOSE: 90 mg/dL (ref 65–99)
POTASSIUM: 4.2 mmol/L (ref 3.5–5.1)
Sodium: 139 mmol/L (ref 135–145)

## 2015-05-20 LAB — CBC
HCT: 35.7 % — ABNORMAL LOW (ref 39.0–52.0)
HEMOGLOBIN: 11.1 g/dL — AB (ref 13.0–17.0)
MCH: 28.5 pg (ref 26.0–34.0)
MCHC: 31.1 g/dL (ref 30.0–36.0)
MCV: 91.8 fL (ref 78.0–100.0)
PLATELETS: 334 10*3/uL (ref 150–400)
RBC: 3.89 MIL/uL — AB (ref 4.22–5.81)
RDW: 14.9 % (ref 11.5–15.5)
WBC: 13.7 10*3/uL — ABNORMAL HIGH (ref 4.0–10.5)

## 2015-05-20 SURGERY — ESOPHAGOGASTRODUODENOSCOPY (EGD) WITH PROPOFOL
Anesthesia: Monitor Anesthesia Care

## 2015-05-20 MED ORDER — PROPOFOL 500 MG/50ML IV EMUL
INTRAVENOUS | Status: DC | PRN
  Administered 2015-05-20: 40 mg via INTRAVENOUS
  Administered 2015-05-20: 20 mg via INTRAVENOUS

## 2015-05-20 MED ORDER — SODIUM CHLORIDE 0.9 % IV SOLN
INTRAVENOUS | Status: DC
Start: 2015-05-20 — End: 2015-05-20

## 2015-05-20 MED ORDER — LACTATED RINGERS IV SOLN
INTRAVENOUS | Status: DC
  Administered 2015-05-20: 14:00:00 via INTRAVENOUS
  Administered 2015-05-20: 1000 mL via INTRAVENOUS

## 2015-05-20 MED ORDER — PROPOFOL 500 MG/50ML IV EMUL
INTRAVENOUS | Status: DC | PRN
  Administered 2015-05-20: 100 ug/kg/min via INTRAVENOUS

## 2015-05-20 MED ORDER — PROPOFOL 10 MG/ML IV BOLUS
INTRAVENOUS | Status: AC
Start: 2015-05-20 — End: 2015-05-20
  Filled 2015-05-20: qty 40

## 2015-05-20 MED ORDER — LIDOCAINE HCL (CARDIAC) 20 MG/ML IV SOLN
INTRAVENOUS | Status: AC
  Filled 2015-05-20: qty 5

## 2015-05-20 SURGICAL SUPPLY — 14 items

## 2015-05-20 NOTE — Progress Notes (Addendum)
Patient ID: Craig Waters., male   DOB: 17-Sep-1930, 82 y.o.   MRN: [id number]  [facility] PROGRESS NOTE  Craig Waters. [id number] DOB: 03-09-1931 DOA: 05/18/2015 PCP: [name], MD   Brief narrative:    82 y.o. male. Hx BPH, s/p radical prostatectomy. Hx A fib. DVT, s/p IVC filter, unable to take [facility] due to hematuria, stage 4, invasive urothelial bladder cancer, TURBT resection 02/28/2015 (high grade, poor prognosis per onc at [place]), s/p nephrostomy and ureteral tubes, recent admission 1/12 - 05/11/15 with UTI. bil perc nephrostomy tubes 05/08/15,  Rad tx By Dr Tammi Klippel 1/19, scheduled for 1/26. Pt presented to [facility] ED with concern he is unable to take anything PO and was vomiting.   CT abdomen done in ED, notable for GOO, gastric distention, fluid filled stomach to D2.Multiple attempts at placing NGT unsuccessful. GI consulted for assistance.  Assessment/Plan:    Principal Problem:   Gastric outlet obstruction - in the setting of metastatic bladder cancer  - GI consulted, appreciate recommendations, plan for EGD today   Active Problems:   Urothelial carcinoma ([place]), stage IV - with mets to liver and abd - d/w Dr Lindi Adie - no role for chemo at this time, palliative and hospice recommended  - ? Venting gastrostomy recommended if needed   Malnutrition of moderate degree - in the setting of the above - NPO for now   DVT prophylaxis - Lovenox SQ  Code Status: Full.  Family Communication:  plan of care discussed with the patient and daughter at bedside  Disposition Plan: Home when acute issues resolved  IV access:  Peripheral IV  Procedures and diagnostic studies:     Ct Abdomen Pelvis W Contrast 05/18/2015  There appears to be gastric outlet obstruction with distended fluid-filled stomach to the level of the second portion the duodenum. There is retroperitoneal infiltration or scarring which may be causing gastric outlet obstruction. Duodenal mass  or inflammation not excluded. Diffuse retroperitoneal inflammation or scarring most prominent on the right extending down to the pelvis. Bilateral percutaneous nephrostomy tubes and left ureteral tube. Diffuse asymmetric bladder wall thickening, likely neoplastic. Loss of distinction between the anterior bladder wall and anterior abdominal wall suggests direct infiltration versus scarring. Metastatic lesion in the right lobe of the liver appears unchanged since prior study.   Ir Nephrostomy Placement Left 05/09/2015  Successful bilateral nephrostomy catheter placement for ureteral obstruction.  Ir Nephrostomy Placement Right 05/09/2015   Successful bilateral nephrostomy catheter placement for ureteral obstruction.   Medical Consultants:  GI PCT Surgery over the phone Oncology Dr. Lindi Adie   Other Consultants:  None   IAnti-Infectives:   None  Craig Ramsay, MD  [facility] Pager [phone]  If 7PM-7AM, please contact night-coverage [email or web address] Password [email or web address] 05/20/2015, 2:12 PM   LOS: 2 days   HPI/Subjective: No events overnight.   Objective: Filed Vitals:   05/19/15 1458 05/19/15 2147 05/20/15 0502 05/20/15 1326  BP: 158/88 173/75 153/66 199/92  Pulse:  95 89   Temp:  98.2 F (36.8 C) 98 F (36.7 C) 98.4 F (36.9 C)  TempSrc:  Oral Oral Oral  Resp:  16 16 9   Height:      Weight:      SpO2:  95% 96% 92%    Intake/Output Summary (Last 24 hours) at 05/20/15 1412 Last data filed at 05/20/15 0754  Gross per 24 hour  Intake   3000 ml  Output    650 ml  Net   2350 ml  Exam:   General:  Pt is alert, follows commands appropriately, not in acute distress  Cardiovascular: Regular rate and rhythm,  no rubs, no gallops  Respiratory: Clear to auscultation bilaterally, no wheezing, no crackles, no rhonchi  Abdomen: Soft, non tender, non distended, bowel sounds present, no guarding  Data Reviewed: Basic Metabolic Panel:  Recent Labs Lab 05/18/15 2140  05/20/15 0505  NA 137 139  K 4.1 4.2  CL 101 108  CO2 26 22  GLUCOSE 122* 90  BUN 16 17  CREATININE 1.15 0.98  CALCIUM 8.6* 8.3*   Liver Function Tests:  Recent Labs Lab 05/18/15 2140  AST 21  ALT 19  ALKPHOS 98  BILITOT 0.6  PROT 6.6  ALBUMIN 3.1*    Recent Labs Lab 05/18/15 2140  LIPASE 42   CBC:  Recent Labs Lab 05/18/15 2140 05/20/15 0505  WBC 14.8* 13.7*  HGB 12.2* 11.1*  HCT 39.0 35.7*  MCV 92.0 91.8  PLT 331 334   Scheduled Meds: . [MAR Hold] enoxaparin (LOVENOX) injection  40 mg Subcutaneous Q24H  . [MAR Hold] methylPREDNISolone (SOLU-MEDROL) injection  20 mg Intravenous Daily  . [MAR Hold] pantoprazole (PROTONIX) IV  40 mg Intravenous Q12H   Continuous Infusions: . sodium chloride 125 mL/hr at 05/20/15 0714  . sodium chloride    . lactated ringers 1,000 mL (05/20/15 1334)

## 2015-05-20 NOTE — Interval H&P Note (Signed)
History and Physical Interval Note:  05/20/2015 1:46 PM  Craig Waters.  has presented today for surgery, with the diagnosis of gastric outlet obstruction.  bladder cancer  The various methods of treatment have been discussed with the patient and family. After consideration of risks, benefits and other options for treatment, the patient has consented to  Procedure(s): ESOPHAGOGASTRODUODENOSCOPY (EGD) WITH PROPOFOL (N/A) as a surgical intervention .  The patient's history has been reviewed, patient examined, no change in status, stable for surgery.  I have reviewed the patient's chart and labs.  Questions were answered to the patient's satisfaction.     Pricilla Riffle. [place] Plan

## 2015-05-20 NOTE — H&P (View-Only) (Signed)
[facility] Consult: 10:24 AM 05/20/2015  LOS: 2 days    Referring Provider: Dr Garwin Brothers  Primary Care Physician:  [name], MD Primary Gastroenterologist:  Dr. Carlean Purl    Reason for Consultation:  ? If stent can be placed to address GOO   HPI: Craig Waters. is a 82 y.o. male.  Hx BPH, s/p radical prostatectomy. Glaucoma. Vertigo.  Discitis. RA.  Hx A fib. DVT, s/p IVC filter, no thinners due to hematuria.   Hx PUD (GIB 1991). Colon polyps.  Diverticulitis.  GERD.  05/2012 Colonoscopy for change in bowel habits.  Two 3 - 5 mm  Sessile polyps in tranverse and sigmoid.  Sigmoid tics, internal rrhoids. Path: tubular adenoma and benign lymphoid polyp.   03/2008 Colonoscopy surveillance of adenomas in 2004, 2005.  Normal mucosa, int/external rrhoids.  01/2004 Colonoscopy  for adenoma surveillance.  Six 3 to 5 mm polyps removed, int/external rrhoids.  Path: adenomatous and HP polyps  12/2002 Colonoscopy for diarrhea.  Thirteen 3 to 50mm polyps removed from descending/sigmoid, mild sigmoid tics.  Path: adenomatous and lymphoid aggregate.  10/2002 EGD for melena.  Duodenal ulcer clipped. 1987 EGD.  Non-bleeding bulbar ulcer, mild gastritis.   Diagnosed with stage 4, invasive urothelial bladder cancer, TURBT resection 02/28/2015 (high grade, poor prognosis per onc at [facility]).   S/p nephrostomy and ureteral tubes.  Not a cystectomy candidate. AKI in 01/2015. 04/05/15 portable abdomen with NSBGP but ? Early PSBO.   Admission 1/12 - 05/11/15 with UTI.  bil perc nephrostomy tubes 05/08/15.  05/07/15 CT abdomen/pelvis: non-obstructive; showed right liver lesion, likely mets. Equivocal sigmoid wall thickening, ?minimal colitis.  Urologic stents in place. Irregular bladder. Pelvic node concerning for mets.    Radiation therapy outlined by Dr Tammi Klippel 1/19, was to initiate this 1/26 .   Admitted 2 days ago with 3 day hx N/V, abdominal pain. Unable to tolerate po. No BMs for about 4 days.  Has had nausea an poor po intake for months.  Distention and lower abdominal discomfort present for several weeks and very bothersome to pt.  The actual vomiting and increased nausea started on 05/14/15.  Hematuria resolved.  Urine output into both Nephro tubes.      CT 05/18/15: GOO, gastric distention, fluid filled stomach to D2.  Retroperitoneal scarring vs infiltration mays be source of obstruction.  Can not exclude duodenal mass.  Diffuse RP inflammation/scarring extending to pelvis > on right.  Perc nephrostomy tubes, left ureteral tube in place.   Bladder asymmetric, loss of distinction between anterior bladder and abd wall.  No change of metastatic right liver lesion.  Attempts to place NGT in ED unsuccessful, caused epistaxis.  He will not allow NGT to be placed now.  So long as not taking significant po, there is no nausea.  Says he is willing to try drinking contrast for UGI series.  Surprisingly, he denies weight loss.     Past Medical History  Diagnosis Date  . Blood in stool   . Diverticulitis   . GERD (gastroesophageal reflux disease)   .  Glaucoma   . Ulcer   . History of transfusion of whole blood   . Prostate disorder   . Shortness of breath 10-18-11    with exertion, cardiac cath done 7-8 yrs ago negative.  . Vertigo 10-18-11    inner ear issues occ. -tx. [place] as needed  . Arthritis 10-18-11    back, fingers  . H/O hiatal hernia 10-18-11    noted on recent CXR  . Adenomatous colon polyp   . Urothelial carcinoma ([name]) 05/2014  . PAF (paroxysmal atrial fibrillation) ([place])   . Rheumatoid arthritis ([place])   . Discitis of thoracic region 07/04/2012    Past Surgical History  Procedure Laterality Date  . Gastric ulcer  10-18-11    '91-blleding ulcer with blood transfusions after  . Appendectomy   10-18-11    age 77  . Cataract extraction, bilateral  10-18-11    bilateral   . Eye surgery  10-18-11    laser eye surgery s/p cataract bilaterally  . Cardiac catheterization  10-18-11    7-8 yrs ago-mild blockage,no tx. indicated  . Prostatectomy  10/27/2011    Procedure: PROSTATECTOMY SUPRAPUBIC;  Surgeon: Ailene Rud, MD;  Location: [facility];  Service: Urology;  Laterality: N/A;  Placement of suprapubic tube  . Cystoscopy  10/27/2011    Procedure: CYSTOSCOPY FLEXIBLE;  Surgeon: Ailene Rud, MD;  Location: [facility];  Service: Urology;  Laterality: N/A;  . Transurethral resection of bladder tumor with gyrus (turbt-gyrus)  06/2014    T1 HG tumor.  [facility].    Prior to Admission medications   Medication Sig Start Date End Date Taking? Authorizing Provider  acidophilus (RISAQUAD) CAPS capsule Take 1 capsule by mouth daily. 05/11/15  Yes Florencia Reasons, MD  amiodarone (PACERONE) 200 MG tablet Take 1 tablet (200 mg total) by mouth 2 (two) times daily. 05/11/15  Yes Florencia Reasons, MD  b complex vitamins tablet Take 1 tablet by mouth daily.   Yes Historical Provider, MD  diltiazem (CARDIZEM CD) 240 MG 24 hr capsule Take 1 capsule (240 mg total) by mouth daily. 05/11/15  Yes Florencia Reasons, MD  hyoscyamine (LEVSIN SL) 0.125 MG SL tablet Take 1 tablet (0.125 mg total) by mouth every 4 (four) hours as needed (bladder spasms). 05/11/15  Yes Florencia Reasons, MD  ondansetron (ZOFRAN) 4 MG tablet Take 4 mg by mouth every 8 (eight) hours as needed for nausea or vomiting.   Yes Historical Provider, MD  oxycodone (OXY-IR) 5 MG capsule Take 5 mg by mouth every 4 (four) hours as needed.   Yes Knox Royalty, NP  polyethylene glycol powder (GLYCOLAX/MIRALAX) powder Take 255 g by mouth daily. Patient taking differently: Take 17 g by mouth 2 (two) times daily as needed (for constipation).  11/27/14  Yes Willia Craze, NP  predniSONE (DELTASONE) 5 MG tablet Take 5 mg by mouth daily with breakfast. .   Yes Historical Provider, MD    ranitidine (ZANTAC) 75 MG tablet Take 75 mg by mouth 2 (two) times daily. 05/14/15  Yes Historical Provider, MD  sennosides-docusate sodium (SENOKOT-S) 8.6-50 MG tablet Take 2 tablets by mouth at bedtime.   Yes Historical Provider, MD  mirtazapine (REMERON) 7.5 MG tablet Take 1 tablet (7.5 mg total) by mouth at bedtime. 05/17/15   Knox Royalty, NP    Scheduled Meds: . enoxaparin (LOVENOX) injection  40 mg Subcutaneous Q24H  . methylPREDNISolone (SOLU-MEDROL) injection  20 mg Intravenous Daily  . pantoprazole (PROTONIX) IV  40 mg Intravenous Q12H   Infusions: . sodium chloride 125 mL/hr at 05/20/15 0714   PRN Meds: fentaNYL (SUBLIMAZE) injection, HYDROmorphone (DILAUDID) injection, ondansetron (ZOFRAN) IV, ondansetron (ZOFRAN) IV   Allergies as of 05/18/2015  . (No Known Allergies)    Family History  Problem Relation Age of Onset  . Breast cancer Sister   . Dementia Mother   . Heart attack Father   . Colon cancer Neg Hx     Social History   Social History  . Marital Status: Married    Spouse Name: N/A  . Number of Children: N/A  . Years of Education: N/A   Occupational History  . [employer] Self Employed   Social History Main Topics  . Smoking status: Former Smoker -- 1.00 packs/day for 5 years    Types: Cigarettes    Quit date: 09/26/1978  . Smokeless tobacco: Current User    Types: Chew    Last Attempt to Quit: 11/01/2011  . Alcohol Use: No  . Drug Use: No  . Sexual Activity: No   Other Topics Concern  . Not on file   Social History Narrative   Married, one daughter. One cup of coffee a day. He is semiretired.    REVIEW OF SYSTEMS: Constitutional:  Somewhat ill but not terminal looking.   ENT:  Nose bleeds.   Pulm:  No sob or cough CV:  No palpitations, no LE edema.  GU:  No hematuria, no frequency GI:  Per HPI.   Heme:  No unusual bleeding   Transfusions:  In past with GI blees Neuro:  No headaches, no peripheral tingling or  numbness Derm:  No itching, no rash or sores.  Endocrine:  No sweats or chills.  No polyuria or dysuria Immunization:  Not queried Travel:  None beyond local counties in last few months.    PHYSICAL EXAM: Vital signs in last 24 hours: Filed Vitals:   05/19/15 2147 05/20/15 0502  BP: 173/75 153/66  Pulse: 95 89  Temp: 98.2 F (36.8 C) 98 F (36.7 C)  Resp: 16 16   Wt Readings from Last 3 Encounters:  05/19/15 91.8 kg (202 lb 6.1 oz)  05/12/15 92.987 kg (205 lb)  05/07/15 92.987 kg (205 lb)    General: pleasant, overweight, comfortable.  Head:  No asymmetry or swellling.    Eyes:  No  Ears:  Slightly HOH  Nose:  No discharge or fresh blood Mouth:  Clear, edentulous. Neck:  No mass, no TMG Lungs:  Clear bil.  No cough or labored breathing Heart: RRR.  No mrg.  S1/s2 audible Abdomen:  Soft, active BS.  No tinkling or tympanitic BS.  No mass or HSM.  No hernias.  Not tender.  Urine in both uro tubes, darker/deeper color on the left tube, Rectal: deferred   Musc/Skeltl: no joint swelling or pain.  Extremities:  No CCE.   Neurologic:  Oriented x 3.  Full limb strength.  No tremor Skin:  No rash, no sores  Tattoos:  None seen.  Nodes:  No cervical or inguinal adenopathy.    Psych:  Pleasant, calm, cooperative.   Intake/Output from previous day: 01/25 0701 - 01/26 0700 In: 3000 [I.V.:3000] Out: 525 [Urine:525] Intake/Output this shift: Total I/O In: -  Out: 300 [Urine:300]  LAB RESULTS:  Recent Labs  05/18/15 2140 05/20/15 0505  WBC 14.8* 13.7*  HGB 12.2* 11.1*  HCT 39.0 35.7*  PLT 331 334   BMET Lab Results  Component Value Date   NA 139 05/20/2015   NA 137 05/18/2015   NA 144 05/11/2015   K 4.2 05/20/2015   K 4.1 05/18/2015   K 3.2* 05/11/2015   CL 108 05/20/2015   CL 101 05/18/2015   CL 104 05/11/2015   CO2 22 05/20/2015   CO2 26 05/18/2015   CO2 30 05/11/2015   GLUCOSE 90 05/20/2015   GLUCOSE 122* 05/18/2015   GLUCOSE 128* 05/11/2015   BUN  17 05/20/2015   BUN 16 05/18/2015   BUN 24* 05/11/2015   CREATININE 0.98 05/20/2015   CREATININE 1.15 05/18/2015   CREATININE 1.55* 05/11/2015   CALCIUM 8.3* 05/20/2015   CALCIUM 8.6* 05/18/2015   CALCIUM 8.0* 05/11/2015   LFT  Recent Labs  05/18/15 2140  PROT 6.6  ALBUMIN 3.1*  AST 21  ALT 19  ALKPHOS 98  BILITOT 0.6   PT/INR Lab Results  Component Value Date   INR 1.00 05/07/2015   INR 1.23 03/17/2015   INR 1.12 02/16/2015   Hepatitis Panel No results for input(s): HEPBSAG, HCVAB, HEPAIGM, HEPBIGM in the last 72 hours. C-Diff No components found for: CDIFF Lipase     Component Value Date/Time   LIPASE 42 05/18/2015 2140    Drugs of Abuse  No results found for: LABOPIA, COCAINSCRNUR, LABBENZ, AMPHETMU, THCU, LABBARB   RADIOLOGY STUDIES: Ct Abdomen Pelvis W Contrast  05/18/2015  CLINICAL DATA:  Right and left lower abdominal pain. Nausea and vomiting since Friday. Leukocytosis. History of hiatal hernia, gastric ulcer, appendectomy, diverticulitis, prostatectomy, bladder cancer, bilateral nephrostomies. EXAM: CT ABDOMEN AND PELVIS WITH CONTRAST TECHNIQUE: Multidetector CT imaging of the abdomen and pelvis was performed using the standard protocol following bolus administration of intravenous contrast. CONTRAST:  51mL OMNIPAQUE IOHEXOL 300 MG/ML  SOLN COMPARISON:  05/06/2015 FINDINGS: The lung bases are clear. Coronary artery and aortic calcifications. There is a heterogeneously enhancing hypoechoic solid mass in the right lobe of the liver inferiorly measuring about 6.8 cm diameter. This was present previously but is better defined on the study with IV contrast material. Appearance is consistent with metastasis. Gallbladder and bile ducts appear normal. Pancreas, spleen, and adrenal glands are unremarkable. Calcification of the abdominal aorta without aneurysm. Inferior vena caval filter is present. Kidneys demonstrate multiple cysts bilaterally. Bilateral nephrostomy tubes  are present with additional left ureteral tube. Renal collecting systems are decompressed. There is scarring or infiltration in the fat of the retroperitoneum surrounding the ureters more prominent on the right. There is extension to the inferior uncinate process of the pancreas. This could represent retroperitoneal fibrosis or neoplastic or inflammatory stranding. Appearances are similar to previous study. The stomach is distended with decompression of the duodenum past the second portion. This is associated with a area of retroperitoneal scarring or infiltration. Gastric outlet obstruction should be excluded. Small bowel and colon are decompressed. No gastric wall thickening appreciated. No free air or free fluid in the abdomen. Abdominal wall musculature appears intact. Pelvis: Bladder is decompressed. There is diffuse asymmetric bladder wall thickening, likely neoplastic. There is loss of fat plane between the anterior bladder wall and the anterior pelvic wall. This could represent direct tumor extension or scarring from previous suprapubic catheter. No significant lymphadenopathy in the pelvis. There appears to been trans urethral resection of the prostate gland. Seminal vesicles are present without enlargement. Degenerative changes in the spine. Slight anterior subluxation of L3 on L4 with associated spondylolysis. No destructive bone lesions. IMPRESSION: There appears to be gastric outlet obstruction  with distended fluid-filled stomach to the level of the second portion the duodenum. There is retroperitoneal infiltration or scarring which may be causing gastric outlet obstruction. Duodenal mass or inflammation not excluded. Diffuse retroperitoneal inflammation or scarring most prominent on the right extending down to the pelvis. Bilateral percutaneous nephrostomy tubes and left ureteral tube. Diffuse asymmetric bladder wall thickening, likely neoplastic. Loss of distinction between the anterior bladder wall  and anterior abdominal wall suggests direct infiltration versus scarring. Metastatic lesion in the right lobe of the liver appears unchanged since prior study. Electronically Signed   By: Lucienne Capers M.D.   On: 05/18/2015 23:31    ENDOSCOPIC STUDIES: Per HPI  IMPRESSION:   *  GOO due to metastatic bladder cancer.  Obstruction appears to be at region of duodenum.     PLAN:     *  EGD this afternoon.    Azucena Freed  05/20/2015, 10:24 AM Pager: [phone]      Attending physician's note   I have taken a history, examined the patient and reviewed the chart. I agree with the Advanced Practitioner's note, impression and recommendations. Suspected obstruction at D2 by CT. Unable to place NGT to decompress and now patient declining NGT. UGI not practical if stomach cannot be decompressed. EGD this afternoon to evaluate. Consider placing orogastric tube while sedated after EGD if decompression needed.   Lucio Edward, MD [facility] [phone] Mon-Fri 8a-5p [phone] after 5p, weekends, holidays

## 2015-05-20 NOTE — Op Note (Signed)
[facility] [place], [address]   ENDOSCOPY PROCEDURE REPORT  PATIENT: Craig Waters, Craig Waters  MR#: [id number] BIRTHDATE: 10/10/1930 , 84  yrs. old GENDER: male ENDOSCOPIST: Ladene Artist, MD, [facility] REFERRED BY:  [facility] PROCEDURE DATE:  05/20/2015 PROCEDURE:  EGD, diagnostic ASA CLASS:     Class III INDICATIONS:  abnormal CT of the GI tract. Stenosis in D2 on CT with GOO. MEDICATIONS: Monitored anesthesia care and Per Anesthesia TOPICAL ANESTHETIC: none DESCRIPTION OF PROCEDURE: After the risks benefits and alternatives of the procedure were thoroughly explained, informed consent was obtained.  The PENTAX GASTOROSCOPE [id number] endoscope was introduced through the mouth and advanced to the third portion of the duodenum , Limited by retained liquids and solids in the stomach.  The instrument was slowly withdrawn as the mucosa was fully examined.    ESOPHAGUS: There was LA Class D esophagitis (Mucosal breaks involving more than 75% of esophageal circumference) noted. STOMACH: The visualized mucosa of the stomach appeared normal.  The body, fundus and cardia were obscured by retained liquids and solids. DUODENUM: Long, traversable, moderate and extrinsic acquired stenosis was found in the 3rd part of the duodenum and 2nd part duodenum.  The stenosis was approximately 10 cm long.  Retroflexed views revealed obscured by retained liquids, solids.   The scope was then withdrawn from the patient and the procedure completed. An NGT was placed while the patient was under sedated.  COMPLICATIONS: There were no immediate complications.  ENDOSCOPIC IMPRESSION: 1.   LA Class D esophagitis 2.   Retained gastric solids and liquids 3.   Long acquired extrinsic stenosis in the 3rd part of the duodenum and 2nd part duodenum  RECOMMENDATIONS: 1.  Continue PPI bid long term for esophagitis 2.  NGT suction to fully decompress stomach and then UGI series  per NGT to further evaluate duodenal stenosis  eSigned:  Ladene Artist, MD, [facility] 05/20/2015 2:29 PM

## 2015-05-20 NOTE — Anesthesia Preprocedure Evaluation (Addendum)
Anesthesia Evaluation  Patient identified by MRN, date of birth, ID band Patient awake    Reviewed: Allergy & Precautions, NPO status , Patient's Chart, lab work & pertinent test results  Airway Mallampati: II  TM Distance: >3 FB Neck ROM: Full   Comment: H/O Rheumatoid Arthritis however he denies neck symptoms and has good ROM Dental no notable dental hx.    Pulmonary shortness of breath, former smoker,    Pulmonary exam normal breath sounds clear to auscultation       Cardiovascular hypertension, Pt. on medications Normal cardiovascular exam Rhythm:Regular Rate:Normal  BP 187/91 preop.   Neuro/Psych PSYCHIATRIC DISORDERS negative neurological ROS     GI/Hepatic Neg liver ROS, hiatal hernia, PUD, GERD  Medicated,  Endo/Other  negative endocrine ROS  Renal/GU Renal disease  negative genitourinary   Musculoskeletal  (+) Arthritis , Rheumatoid disorders,    Abdominal   Peds negative pediatric ROS (+)  Hematology negative hematology ROS (+)   Anesthesia Other Findings   Reproductive/Obstetrics negative OB ROS                           Anesthesia Physical Anesthesia Plan  ASA: III  Anesthesia Plan: MAC   Post-op Pain Management:    Induction: Intravenous  Airway Management Planned: Natural Airway  Additional Equipment:   Intra-op Plan:   Post-operative Plan:   Informed Consent: I have reviewed the patients History and Physical, chart, labs and discussed the procedure including the risks, benefits and alternatives for the proposed anesthesia with the patient or authorized representative who has indicated his/her understanding and acceptance.   Dental advisory given  Plan Discussed with: CRNA  Anesthesia Plan Comments:         Anesthesia Quick Evaluation

## 2015-05-20 NOTE — Anesthesia Postprocedure Evaluation (Signed)
Anesthesia Post Note  Patient: Craig Waters.  Procedure(s) Performed: Procedure(s) (LRB): ESOPHAGOGASTRODUODENOSCOPY (EGD) WITH PROPOFOL (N/A)  Patient location during evaluation: PACU Anesthesia Type: MAC Level of consciousness: awake and alert Pain management: pain level controlled Vital Signs Assessment: post-procedure vital signs reviewed and stable Respiratory status: spontaneous breathing, nonlabored ventilation, respiratory function stable and patient connected to nasal cannula oxygen Cardiovascular status: stable and blood pressure returned to baseline Anesthetic complications: no    Last Vitals:  Filed Vitals:   05/20/15 1430 05/20/15 1507  BP: 159/59 164/77  Pulse: 83 85  Temp:    Resp: 16 15    Last Pain:  Filed Vitals:   05/20/15 1509  PainSc: 2                  Luisantonio Adinolfi J

## 2015-05-20 NOTE — Transfer of Care (Signed)
Immediate Anesthesia Transfer of Care Note  Patient: Craig Waters.  Procedure(s) Performed: Procedure(s): ESOPHAGOGASTRODUODENOSCOPY (EGD) WITH PROPOFOL (N/A)  Patient Location: PACU  Anesthesia Type:MAC  Level of Consciousness: awake, alert  and oriented  Airway & Oxygen Therapy: Patient Spontanous Breathing and Patient connected to nasal cannula oxygen  Post-op Assessment: Report given to RN and Post -op Vital signs reviewed and stable  Post vital signs: Reviewed and stable  Last Vitals:  Filed Vitals:   05/20/15 0502 05/20/15 1326  BP: 153/66 199/92  Pulse: 89   Temp: 36.7 C 36.9 C  Resp: 16 9    Complications: No apparent anesthesia complications

## 2015-05-20 NOTE — Consult Note (Signed)
Consultation Note Date: 05/20/2015   Patient Name: Craig Waters.  DOB: 17-Jul-1930  MRN: [id number]  Age / Sex: 82 y.o., male  PCP: Lajean Manes, MD Referring Physician: Theodis Blaze, MD  Reason for Consultation: Establishing goals of care, Non pain symptom management, Pain control and Psychosocial/spiritual support    Clinical Assessment/Narrative:    Craig Waters. is a 82 y.o. male. Hx BPH, s/p radical prostatectomy. Glaucoma. Vertigo. Discitis. RA. Hx A fib. DVT, s/p IVC filter,  diagnosed with stage 4, invasive urothelial bladder cancer, TURBT resection 02/28/2015 (high grade, poor prognosis per onc at [facility]). S/p nephrostomy and ureteral tubes. Not a cystectomy candidate. AKI in 01/2015.  Admission 1/12 - 05/11/15 with UTI. bil perc nephrostomy tubes 05/08/15. 05/07/15 CT abdomen/pelvis: non-obstructive; showed right liver lesion, likely mets. Equivocal sigmoid wall thickening,  Urologic stents in place.  Pelvic node concerning for mets.  Radiation therapy outlined by Dr Tammi Klippel 1/19, was to initiate this 1/26, on hold at this time.  Continued physical, functional decline over the past several months with heavy symptom burden  and many hospital admissions   Admitted 2 days ago with 3 day hx N/V, abdominal pain. Unable to tolerate po. No BMs for about 4 days. Has had nausea an poor po intake for months. Distention and lower abdominal discomfort present for several weeks and very bothersome to pt. The actual vomiting and increased nausea started on 05/14/15. Hematuria resolved. Urine output into both Nephro tubes.  CT 05/18/15: GOO, gastric distention, fluid filled stomach to D2. Retroperitoneal scarring vs infiltration mays be source of obstruction. Can not exclude duodenal mass. Diffuse RP inflammation/scarring extending to pelvis > on right. Perc nephrostomy tubes, left ureteral  tube in place. Bladder asymmetric, loss of distinction between anterior bladder and abd wall. No change of metastatic right liver lesion.   Attempts to place NGT in ED unsuccessful, caused epistaxis.   I meet Craig Waters and his daughter Craig Waters on Monday in the OP radiation-oncology clinic for an introduction to palliative medicine, and now again today reviewed medical records, received report from team, assessed the patient and then meet at the patient's bedside along with daughter/[name]  to discuss diagnosis, prognosis, GOC, EOL wishes disposition and options.   A  discussion was had specific to [facility] wishes regarding aggressiveness of medical interventions and intent.  The difference between a aggressive medical intervention path  and a palliative comfort care path for this patient at this time was had.  Values and goals of care important to patient and family were attempted to be elicited.  Concept of Hospice and Palliative Care were discussed    Questions and concerns addressed. . Family encouraged to call with questions or concerns.  PMT will continue to support holistically.    Primary Decision Maker: Craig Waters and his daughter  Craig Waters   HCPOA: none docuemtned   SUMMARY OF RECOMMENDATIONS  - priority is comfort, open to any GI intervention that will offer relief of abdominal pain and distention -patient leans to a more supportive approach to care understanding his poor prognosis  -requests oncology consult - will make decisions regarding scheduled radiation treatment dependent on outcomes of the next few days  Code Status/Advance Care Planning:  DNR      Code Status Orders        Start     Ordered   05/19/15 0002  Do not attempt resuscitation (DNR)   Continuous    Question Answer Comment  In the event  of cardiac or respiratory ARREST Do not call a "code blue"   In the event of cardiac or respiratory ARREST Do not perform Intubation, CPR, defibrillation  or ACLS   In the event of cardiac or respiratory ARREST Use medication by any route, position, wound care, and other measures to relive pain and suffering. May use oxygen, suction and manual treatment of airway obstruction as needed for comfort.      05/19/15 0002    Code Status History    Date Active Date Inactive Code Status Order ID Comments User Context   05/07/2015  2:31 AM 05/11/2015  3:15 PM Full Code [id number]  Ivor Costa, MD ED   04/05/2015  3:59 AM 04/09/2015  2:58 PM Full Code [id number]  Oswald Hillock, MD Inpatient   03/14/2015  4:26 PM 03/20/2015  6:52 PM Full Code [id number]  Elmarie Shiley, MD Inpatient   02/15/2015  8:15 PM 02/22/2015 10:17 PM Full Code [id number]  Venetia Maxon Rama, MD Inpatient   05/15/2014 12:30 AM 05/15/2014  8:16 PM Full Code [id number]  Allyne Gee, MD Inpatient   07/04/2012  8:45 PM 07/08/2012  4:33 PM Full Code [id number]  Derrill Kay, MD ED   11/08/2011 11:45 PM 11/13/2011  5:35 AM Full Code [id number]  Lenis Noon, RN Inpatient      Other Directives:None  Symptom Management:   GI: work-up in progress  Palliative Prophylaxis:   Delirium Protocol, Frequent Pain Assessment and Oral Care   Psycho-social/Spiritual:  Support System: [place] Desire for further Chaplaincy support:no Additional Recommendations: Education on Hospice  Prognosis: Hospice eligible with prognosis of 6 months or less  Discharge Planning: Pending outcomes   Chief Complaint/ Primary Diagnoses: Present on Admission:  . Gastric outlet obstruction . Urothelial carcinoma ([place])  I have reviewed the medical record, interviewed the patient and family, and examined the patient. The following aspects are pertinent.  Past Medical History  Diagnosis Date  . Blood in stool   . Diverticulitis   . GERD (gastroesophageal reflux disease)   . Glaucoma   . Ulcer   . History of transfusion of whole blood   . Prostate disorder   . Shortness of breath 10-18-11    with exertion,  cardiac cath done 7-8 yrs ago negative.  . Vertigo 10-18-11    inner ear issues occ. -tx. Bon[place] as needed  . Arthritis 10-18-11    back, fingers  . H/O hiatal hernia 10-18-11    noted on recent CXR  . Adenomatous colon polyp   . Urothelial carcinoma ([place]) 05/2014  . PAF (paroxysmal atrial fibrillation) ([place])   . Rheumatoid arthritis ([place])   . Discitis of thoracic region 07/04/2012   Social History   Social History  . Marital Status: Married    Spouse Name: N/A  . Number of Children: N/A  . Years of Education: N/A   Occupational History  . [employer]   Social History Main Topics  . Smoking status: Former Smoker -- 1.00 packs/day for 5 years    Types: Cigarettes    Quit date: 09/26/1978  . Smokeless tobacco: Current User    Types: Chew    Last Attempt to Quit: 11/01/2011  . Alcohol Use: No  . Drug Use: No  . Sexual Activity: No   Other Topics Concern  . None   Social History Narrative   Married, one daughter. One cup of coffee a day. He is semiretired.   Family History  Problem Relation Age of Onset  . Breast cancer Sister   . Dementia Mother   . Heart attack Father   . Colon cancer Neg Hx    Scheduled Meds: . enoxaparin (LOVENOX) injection  40 mg Subcutaneous Q24H  . methylPREDNISolone (SOLU-MEDROL) injection  20 mg Intravenous Daily  . pantoprazole (PROTONIX) IV  40 mg Intravenous Q12H   Continuous Infusions: . sodium chloride 125 mL/hr at 05/20/15 0714   PRN Meds:.fentaNYL (SUBLIMAZE) injection, HYDROmorphone (DILAUDID) injection, ondansetron (ZOFRAN) IV, ondansetron (ZOFRAN) IV Medications Prior to Admission:  Prior to Admission medications   Medication Sig Start Date End Date Taking? Authorizing Provider  acidophilus (RISAQUAD) CAPS capsule Take 1 capsule by mouth daily. 05/11/15  Yes Florencia Reasons, MD  amiodarone (PACERONE) 200 MG tablet Take 1 tablet (200 mg total) by mouth 2 (two) times daily. 05/11/15  Yes Florencia Reasons, MD  b  complex vitamins tablet Take 1 tablet by mouth daily.   Yes Historical Provider, MD  diltiazem (CARDIZEM CD) 240 MG 24 hr capsule Take 1 capsule (240 mg total) by mouth daily. 05/11/15  Yes Florencia Reasons, MD  hyoscyamine (LEVSIN SL) 0.125 MG SL tablet Take 1 tablet (0.125 mg total) by mouth every 4 (four) hours as needed (bladder spasms). 05/11/15  Yes Florencia Reasons, MD  ondansetron (ZOFRAN) 4 MG tablet Take 4 mg by mouth every 8 (eight) hours as needed for nausea or vomiting.   Yes Historical Provider, MD  oxycodone (OXY-IR) 5 MG capsule Take 5 mg by mouth every 4 (four) hours as needed.   Yes Knox Royalty, NP  polyethylene glycol powder (GLYCOLAX/MIRALAX) powder Take 255 g by mouth daily. Patient taking differently: Take 17 g by mouth 2 (two) times daily as needed (for constipation).  11/27/14  Yes Willia Craze, NP  predniSONE (DELTASONE) 5 MG tablet Take 5 mg by mouth daily with breakfast. .   Yes Historical Provider, MD  ranitidine (ZANTAC) 75 MG tablet Take 75 mg by mouth 2 (two) times daily. 05/14/15  Yes Historical Provider, MD  sennosides-docusate sodium (SENOKOT-S) 8.6-50 MG tablet Take 2 tablets by mouth at bedtime.   Yes Historical Provider, MD  mirtazapine (REMERON) 7.5 MG tablet Take 1 tablet (7.5 mg total) by mouth at bedtime. 05/17/15   Knox Royalty, NP   No Known Allergies  Review of Systems  Constitutional: Positive for activity change, appetite change and fatigue.  Gastrointestinal: Positive for nausea, vomiting, abdominal pain and abdominal distention.  Musculoskeletal: Positive for back pain.    Physical Exam  Constitutional: He is oriented to person, place, and time. He appears well-developed.  HENT:  Mouth/Throat: Mucous membranes are dry. No oropharyngeal exudate.  Cardiovascular: Tachycardia present.   Respiratory: He has decreased breath sounds in the right lower field and the left lower field.  GI: He exhibits distension. There is tenderness.  Musculoskeletal:       Right  shoulder: He exhibits decreased strength.  Neurological: He is alert and oriented to person, place, and time.  Skin: Skin is warm and dry.    Vital Signs: BP 153/66 mmHg  Pulse 89  Temp(Src) 98 F (36.7 C) (Oral)  Resp 16  Ht 5\' 11"  (1.803 m)  Wt 91.8 kg (202 lb 6.1 oz)  BMI 28.24 kg/m2  SpO2 96%  SpO2: SpO2: 96 % O2 Device:SpO2: 96 % O2 Flow Rate: .   IO: Intake/output summary:  Intake/Output Summary (Last 24 hours) at 05/20/15 1112 Last data filed at 05/20/15 0754  Gross  per 24 hour  Intake   3000 ml  Output    650 ml  Net   2350 ml    LBM: Last BM Date: 05/17/15 Baseline Weight: Weight: 92.987 kg (205 lb) Most recent weight: Weight: 91.8 kg (202 lb 6.1 oz)      Palliative Assessment/Data:    Additional Data Reviewed:  CBC:    Component Value Date/Time   WBC 13.7* 05/20/2015 0505   HGB 11.1* 05/20/2015 0505   HCT 35.7* 05/20/2015 0505   PLT 334 05/20/2015 0505   MCV 91.8 05/20/2015 0505   NEUTROABS 11.3* 03/14/2015 1220   LYMPHSABS 1.3 03/14/2015 1220   MONOABS 0.9 03/14/2015 1220   EOSABS 0.1 03/14/2015 1220   BASOSABS 0.0 03/14/2015 1220   Comprehensive Metabolic Panel:    Component Value Date/Time   NA 139 05/20/2015 0505   K 4.2 05/20/2015 0505   CL 108 05/20/2015 0505   CO2 22 05/20/2015 0505   BUN 17 05/20/2015 0505   CREATININE 0.98 05/20/2015 0505   GLUCOSE 90 05/20/2015 0505   CALCIUM 8.3* 05/20/2015 0505   AST 21 05/18/2015 2140   ALT 19 05/18/2015 2140   ALKPHOS 98 05/18/2015 2140   BILITOT 0.6 05/18/2015 2140   PROT 6.6 05/18/2015 2140   ALBUMIN 3.1* 05/18/2015 2140   Discussed with Dr Doyle Askew  Time In: 0800 Time Out: 0930 Time Total: 90 min Greater than 50%  of this time was spent counseling and coordinating care related to the above assessment and plan.  Signed by: Wadie Lessen, NP  Knox Royalty, NP  05/20/2015, 11:12 AM  Please contact Palliative Medicine Team phone at [phone] for questions and concerns.

## 2015-05-20 NOTE — Consult Note (Addendum)
[facility] Consult: 10:24 AM 05/20/2015  LOS: 2 days    Referring Provider: Dr Garwin Brothers  Primary Care Physician:  [name], MD Primary Gastroenterologist:  Dr. Carlean Purl    Reason for Consultation:  ? If stent can be placed to address GOO   HPI: Craig Waters. is a 82 y.o. male.  Hx BPH, s/p radical prostatectomy. Glaucoma. Vertigo.  Discitis. RA.  Hx A fib. DVT, s/p IVC filter, no thinners due to hematuria.   Hx PUD (GIB 1991). Colon polyps.  Diverticulitis.  GERD.  05/2012 Colonoscopy for change in bowel habits.  Two 3 - 5 mm  Sessile polyps in tranverse and sigmoid.  Sigmoid tics, internal rrhoids. Path: tubular adenoma and benign lymphoid polyp.   03/2008 Colonoscopy surveillance of adenomas in 2004, 2005.  Normal mucosa, int/external rrhoids.  01/2004 Colonoscopy  for adenoma surveillance.  Six 3 to 5 mm polyps removed, int/external rrhoids.  Path: adenomatous and HP polyps  12/2002 Colonoscopy for diarrhea.  Thirteen 3 to 16mm polyps removed from descending/sigmoid, mild sigmoid tics.  Path: adenomatous and lymphoid aggregate.  10/2002 EGD for melena.  Duodenal ulcer clipped. 1987 EGD.  Non-bleeding bulbar ulcer, mild gastritis.   Diagnosed with stage 4, invasive urothelial bladder cancer, TURBT resection 02/28/2015 (high grade, poor prognosis per onc at [place]).   S/p nephrostomy and ureteral tubes.  Not a cystectomy candidate. AKI in 01/2015. 04/05/15 portable abdomen with NSBGP but ? Early PSBO.   Admission 1/12 - 05/11/15 with UTI.  bil perc nephrostomy tubes 05/08/15.  05/07/15 CT abdomen/pelvis: non-obstructive; showed right liver lesion, likely mets. Equivocal sigmoid wall thickening, ?minimal colitis.  Urologic stents in place. Irregular bladder. Pelvic node concerning for mets.    Radiation therapy outlined by Dr Tammi Klippel 1/19, was to initiate this 1/26 .   Admitted 2 days ago with 3 day hx N/V, abdominal pain. Unable to tolerate po. No BMs for about 4 days.  Has had nausea an poor po intake for months.  Distention and lower abdominal discomfort present for several weeks and very bothersome to pt.  The actual vomiting and increased nausea started on 05/14/15.  Hematuria resolved.  Urine output into both Nephro tubes.      CT 05/18/15: GOO, gastric distention, fluid filled stomach to D2.  Retroperitoneal scarring vs infiltration mays be source of obstruction.  Can not exclude duodenal mass.  Diffuse RP inflammation/scarring extending to pelvis > on right.  Perc nephrostomy tubes, left ureteral tube in place.   Bladder asymmetric, loss of distinction between anterior bladder and abd wall.  No change of metastatic right liver lesion.  Attempts to place NGT in ED unsuccessful, caused epistaxis.  He will not allow NGT to be placed now.  So long as not taking significant po, there is no nausea.  Says he is willing to try drinking contrast for UGI series.  Surprisingly, he denies weight loss.     Past Medical History  Diagnosis Date  . Blood in stool   . Diverticulitis   . GERD (gastroesophageal reflux disease)   .  Glaucoma   . Ulcer   . History of transfusion of whole blood   . Prostate disorder   . Shortness of breath 10-18-11    with exertion, cardiac cath done 7-8 yrs ago negative.  . Vertigo 10-18-11    inner ear issues occ. -tx. [place] as needed  . Arthritis 10-18-11    back, fingers  . H/O hiatal hernia 10-18-11    noted on recent CXR  . Adenomatous colon polyp   . Urothelial carcinoma ([place]) 05/2014  . PAF (paroxysmal atrial fibrillation) ([place])   . Rheumatoid arthritis ([place])   . Discitis of thoracic region 07/04/2012    Past Surgical History  Procedure Laterality Date  . Gastric ulcer  10-18-11    '91-blleding ulcer with blood transfusions after  . Appendectomy   10-18-11    age 81  . Cataract extraction, bilateral  10-18-11    bilateral   . Eye surgery  10-18-11    laser eye surgery s/p cataract bilaterally  . Cardiac catheterization  10-18-11    7-8 yrs ago-mild blockage,no tx. indicated  . Prostatectomy  10/27/2011    Procedure: PROSTATECTOMY SUPRAPUBIC;  Surgeon: Ailene Rud, MD;  Location: [facility];  Service: Urology;  Laterality: N/A;  Placement of suprapubic tube  . Cystoscopy  10/27/2011    Procedure: CYSTOSCOPY FLEXIBLE;  Surgeon: Ailene Rud, MD;  Location: [facility];  Service: Urology;  Laterality: N/A;  . Transurethral resection of bladder tumor with gyrus (turbt-gyrus)  06/2014    T1 HG tumor.  [facility].    Prior to Admission medications   Medication Sig Start Date End Date Taking? Authorizing Provider  acidophilus (RISAQUAD) CAPS capsule Take 1 capsule by mouth daily. 05/11/15  Yes Florencia Reasons, MD  amiodarone (PACERONE) 200 MG tablet Take 1 tablet (200 mg total) by mouth 2 (two) times daily. 05/11/15  Yes Florencia Reasons, MD  b complex vitamins tablet Take 1 tablet by mouth daily.   Yes Historical Provider, MD  diltiazem (CARDIZEM CD) 240 MG 24 hr capsule Take 1 capsule (240 mg total) by mouth daily. 05/11/15  Yes Florencia Reasons, MD  hyoscyamine (LEVSIN SL) 0.125 MG SL tablet Take 1 tablet (0.125 mg total) by mouth every 4 (four) hours as needed (bladder spasms). 05/11/15  Yes Florencia Reasons, MD  ondansetron (ZOFRAN) 4 MG tablet Take 4 mg by mouth every 8 (eight) hours as needed for nausea or vomiting.   Yes Historical Provider, MD  oxycodone (OXY-IR) 5 MG capsule Take 5 mg by mouth every 4 (four) hours as needed.   Yes Knox Royalty, NP  polyethylene glycol powder (GLYCOLAX/MIRALAX) powder Take 255 g by mouth daily. Patient taking differently: Take 17 g by mouth 2 (two) times daily as needed (for constipation).  11/27/14  Yes Willia Craze, NP  predniSONE (DELTASONE) 5 MG tablet Take 5 mg by mouth daily with breakfast. .   Yes Historical Provider, MD    ranitidine (ZANTAC) 75 MG tablet Take 75 mg by mouth 2 (two) times daily. 05/14/15  Yes Historical Provider, MD  sennosides-docusate sodium (SENOKOT-S) 8.6-50 MG tablet Take 2 tablets by mouth at bedtime.   Yes Historical Provider, MD  mirtazapine (REMERON) 7.5 MG tablet Take 1 tablet (7.5 mg total) by mouth at bedtime. 05/17/15   Knox Royalty, NP    Scheduled Meds: . enoxaparin (LOVENOX) injection  40 mg Subcutaneous Q24H  . methylPREDNISolone (SOLU-MEDROL) injection  20 mg Intravenous Daily  . pantoprazole (PROTONIX) IV  40 mg Intravenous Q12H   Infusions: . sodium chloride 125 mL/hr at 05/20/15 0714   PRN Meds: fentaNYL (SUBLIMAZE) injection, HYDROmorphone (DILAUDID) injection, ondansetron (ZOFRAN) IV, ondansetron (ZOFRAN) IV   Allergies as of 05/18/2015  . (No Known Allergies)    Family History  Problem Relation Age of Onset  . Breast cancer Sister   . Dementia Mother   . Heart attack Father   . Colon cancer Neg Hx     Social History   Social History  . Marital Status: Married    Spouse Name: N/A  . Number of Children: N/A  . Years of Education: N/A   Occupational History  . [employer] Self Employed   Social History Main Topics  . Smoking status: Former Smoker -- 1.00 packs/day for 5 years    Types: Cigarettes    Quit date: 09/26/1978  . Smokeless tobacco: Current User    Types: Chew    Last Attempt to Quit: 11/01/2011  . Alcohol Use: No  . Drug Use: No  . Sexual Activity: No   Other Topics Concern  . Not on file   Social History Narrative   Married, one daughter. One cup of coffee a day. He is semiretired.    REVIEW OF SYSTEMS: Constitutional:  Somewhat ill but not terminal looking.   ENT:  Nose bleeds.   Pulm:  No sob or cough CV:  No palpitations, no LE edema.  GU:  No hematuria, no frequency GI:  Per HPI.   Heme:  No unusual bleeding   Transfusions:  In past with GI blees Neuro:  No headaches, no peripheral tingling or  numbness Derm:  No itching, no rash or sores.  Endocrine:  No sweats or chills.  No polyuria or dysuria Immunization:  Not queried Travel:  None beyond local counties in last few months.    PHYSICAL EXAM: Vital signs in last 24 hours: Filed Vitals:   05/19/15 2147 05/20/15 0502  BP: 173/75 153/66  Pulse: 95 89  Temp: 98.2 F (36.8 C) 98 F (36.7 C)  Resp: 16 16   Wt Readings from Last 3 Encounters:  05/19/15 91.8 kg (202 lb 6.1 oz)  05/12/15 92.987 kg (205 lb)  05/07/15 92.987 kg (205 lb)    General: pleasant, overweight, comfortable.  Head:  No asymmetry or swellling.    Eyes:  No  Ears:  Slightly HOH  Nose:  No discharge or fresh blood Mouth:  Clear, edentulous. Neck:  No mass, no TMG Lungs:  Clear bil.  No cough or labored breathing Heart: RRR.  No mrg.  S1/s2 audible Abdomen:  Soft, active BS.  No tinkling or tympanitic BS.  No mass or HSM.  No hernias.  Not tender.  Urine in both uro tubes, darker/deeper color on the left tube, Rectal: deferred   Musc/Skeltl: no joint swelling or pain.  Extremities:  No CCE.   Neurologic:  Oriented x 3.  Full limb strength.  No tremor Skin:  No rash, no sores  Tattoos:  None seen.  Nodes:  No cervical or inguinal adenopathy.    Psych:  Pleasant, calm, cooperative.   Intake/Output from previous day: 01/25 0701 - 01/26 0700 In: 3000 [I.V.:3000] Out: 525 [Urine:525] Intake/Output this shift: Total I/O In: -  Out: 300 [Urine:300]  LAB RESULTS:  Recent Labs  05/18/15 2140 05/20/15 0505  WBC 14.8* 13.7*  HGB 12.2* 11.1*  HCT 39.0 35.7*  PLT 331 334   BMET Lab Results  Component Value Date   NA 139 05/20/2015   NA 137 05/18/2015   NA 144 05/11/2015   K 4.2 05/20/2015   K 4.1 05/18/2015   K 3.2* 05/11/2015   CL 108 05/20/2015   CL 101 05/18/2015   CL 104 05/11/2015   CO2 22 05/20/2015   CO2 26 05/18/2015   CO2 30 05/11/2015   GLUCOSE 90 05/20/2015   GLUCOSE 122* 05/18/2015   GLUCOSE 128* 05/11/2015   BUN  17 05/20/2015   BUN 16 05/18/2015   BUN 24* 05/11/2015   CREATININE 0.98 05/20/2015   CREATININE 1.15 05/18/2015   CREATININE 1.55* 05/11/2015   CALCIUM 8.3* 05/20/2015   CALCIUM 8.6* 05/18/2015   CALCIUM 8.0* 05/11/2015   LFT  Recent Labs  05/18/15 2140  PROT 6.6  ALBUMIN 3.1*  AST 21  ALT 19  ALKPHOS 98  BILITOT 0.6   PT/INR Lab Results  Component Value Date   INR 1.00 05/07/2015   INR 1.23 03/17/2015   INR 1.12 02/16/2015   Hepatitis Panel No results for input(s): HEPBSAG, HCVAB, HEPAIGM, HEPBIGM in the last 72 hours. C-Diff No components found for: CDIFF Lipase     Component Value Date/Time   LIPASE 42 05/18/2015 2140    Drugs of Abuse  No results found for: LABOPIA, COCAINSCRNUR, LABBENZ, AMPHETMU, THCU, LABBARB   RADIOLOGY STUDIES: Ct Abdomen Pelvis W Contrast  05/18/2015  CLINICAL DATA:  Right and left lower abdominal pain. Nausea and vomiting since Friday. Leukocytosis. History of hiatal hernia, gastric ulcer, appendectomy, diverticulitis, prostatectomy, bladder cancer, bilateral nephrostomies. EXAM: CT ABDOMEN AND PELVIS WITH CONTRAST TECHNIQUE: Multidetector CT imaging of the abdomen and pelvis was performed using the standard protocol following bolus administration of intravenous contrast. CONTRAST:  18mL OMNIPAQUE IOHEXOL 300 MG/ML  SOLN COMPARISON:  05/06/2015 FINDINGS: The lung bases are clear. Coronary artery and aortic calcifications. There is a heterogeneously enhancing hypoechoic solid mass in the right lobe of the liver inferiorly measuring about 6.8 cm diameter. This was present previously but is better defined on the study with IV contrast material. Appearance is consistent with metastasis. Gallbladder and bile ducts appear normal. Pancreas, spleen, and adrenal glands are unremarkable. Calcification of the abdominal aorta without aneurysm. Inferior vena caval filter is present. Kidneys demonstrate multiple cysts bilaterally. Bilateral nephrostomy tubes  are present with additional left ureteral tube. Renal collecting systems are decompressed. There is scarring or infiltration in the fat of the retroperitoneum surrounding the ureters more prominent on the right. There is extension to the inferior uncinate process of the pancreas. This could represent retroperitoneal fibrosis or neoplastic or inflammatory stranding. Appearances are similar to previous study. The stomach is distended with decompression of the duodenum past the second portion. This is associated with a area of retroperitoneal scarring or infiltration. Gastric outlet obstruction should be excluded. Small bowel and colon are decompressed. No gastric wall thickening appreciated. No free air or free fluid in the abdomen. Abdominal wall musculature appears intact. Pelvis: Bladder is decompressed. There is diffuse asymmetric bladder wall thickening, likely neoplastic. There is loss of fat plane between the anterior bladder wall and the anterior pelvic wall. This could represent direct tumor extension or scarring from previous suprapubic catheter. No significant lymphadenopathy in the pelvis. There appears to been trans urethral resection of the prostate gland. Seminal vesicles are present without enlargement. Degenerative changes in the spine. Slight anterior subluxation of L3 on L4 with associated spondylolysis. No destructive bone lesions. IMPRESSION: There appears to be gastric outlet obstruction  with distended fluid-filled stomach to the level of the second portion the duodenum. There is retroperitoneal infiltration or scarring which may be causing gastric outlet obstruction. Duodenal mass or inflammation not excluded. Diffuse retroperitoneal inflammation or scarring most prominent on the right extending down to the pelvis. Bilateral percutaneous nephrostomy tubes and left ureteral tube. Diffuse asymmetric bladder wall thickening, likely neoplastic. Loss of distinction between the anterior bladder wall  and anterior abdominal wall suggests direct infiltration versus scarring. Metastatic lesion in the right lobe of the liver appears unchanged since prior study. Electronically Signed   By: Lucienne Capers M.D.   On: 05/18/2015 23:31    ENDOSCOPIC STUDIES: Per HPI  IMPRESSION:   *  GOO due to metastatic bladder cancer.  Obstruction appears to be at region of duodenum.     PLAN:     *  EGD this afternoon.    Azucena Freed  05/20/2015, 10:24 AM Pager: [phone]      Attending physician's note   I have taken a history, examined the patient and reviewed the chart. I agree with the Advanced Practitioner's note, impression and recommendations. Suspected obstruction at D2 by CT. Unable to place NGT to decompress and now patient declining NGT. UGI not practical if stomach cannot be decompressed. EGD this afternoon to evaluate. Consider placing NGT or OGT while sedated after EGD if decompression needed.   Lucio Edward, MD [facility] [phone] Mon-Fri 8a-5p [phone] after 5p, weekends, holidays

## 2015-05-21 ENCOUNTER — Ambulatory Visit: Payer: Medicare Other

## 2015-05-21 ENCOUNTER — Inpatient Hospital Stay (HOSPITAL_COMMUNITY): Payer: Medicare Other

## 2015-05-21 ENCOUNTER — Encounter (HOSPITAL_COMMUNITY): Payer: Self-pay | Admitting: Gastroenterology

## 2015-05-21 DIAGNOSIS — D72829 Elevated white blood cell count, unspecified: Secondary | ICD-10-CM | POA: Diagnosis present

## 2015-05-21 DIAGNOSIS — Z86718 Personal history of other venous thrombosis and embolism: Secondary | ICD-10-CM

## 2015-05-21 DIAGNOSIS — I160 Hypertensive urgency: Secondary | ICD-10-CM | POA: Diagnosis not present

## 2015-05-21 DIAGNOSIS — N183 Chronic kidney disease, stage 3 unspecified: Secondary | ICD-10-CM | POA: Diagnosis present

## 2015-05-21 LAB — BASIC METABOLIC PANEL
Anion gap: 10 (ref 5–15)
BUN: 19 mg/dL (ref 6–20)
CALCIUM: 8.1 mg/dL — AB (ref 8.9–10.3)
CO2: 21 mmol/L — ABNORMAL LOW (ref 22–32)
CREATININE: 0.93 mg/dL (ref 0.61–1.24)
Chloride: 105 mmol/L (ref 101–111)
GFR calc Af Amer: >60 mL/min (ref >60)
Glucose, Bld: 72 mg/dL (ref 65–99)
POTASSIUM: 4 mmol/L (ref 3.5–5.1)
SODIUM: 136 mmol/L (ref 135–145)

## 2015-05-21 LAB — CBC
HCT: 36.8 % — ABNORMAL LOW (ref 39.0–52.0)
Hemoglobin: 11.5 g/dL — ABNORMAL LOW (ref 13.0–17.0)
MCH: 28.3 pg (ref 26.0–34.0)
MCHC: 31.3 g/dL (ref 30.0–36.0)
MCV: 90.6 fL (ref 78.0–100.0)
PLATELETS: 328 10*3/uL (ref 150–400)
RBC: 4.06 MIL/uL — AB (ref 4.22–5.81)
RDW: 14.6 % (ref 11.5–15.5)
WBC: 14.8 10*3/uL — ABNORMAL HIGH (ref 4.0–10.5)

## 2015-05-21 MED ORDER — IOHEXOL 300 MG/ML  SOLN
25.0000 mL | Freq: Once | INTRAMUSCULAR | Status: AC | PRN
  Administered 2015-05-21: 150 mL via ORAL

## 2015-05-21 MED ORDER — HYDRALAZINE HCL 20 MG/ML IJ SOLN
5.0000 mg | INTRAMUSCULAR | Status: DC | PRN
  Administered 2015-05-24: 5 mg via INTRAVENOUS
  Filled 2015-05-21: qty 1

## 2015-05-21 NOTE — Progress Notes (Signed)
     [facility] Progress Note  Subjective:  Abdomen is sore but feels better overall since NGT in.  Objective:  Vital signs in last 24 hours: Temp:  [98.2 F (36.8 C)-98.4 F (36.9 C)] 98.4 F (36.9 C) (01/27 0456) Pulse Rate:  [83-93] 92 (01/27 0456) Resp:  [9-19] 16 (01/27 0456) BP: (149-199)/(59-94) 169/65 mmHg (01/27 0700) SpO2:  [92 %-96 %] 96 % (01/27 0456) Last BM Date: 05/10/15 General:  Alert, Well-developed, in NAD Heart:  Regular rate and rhythm; no murmurs Pulm:  CTAB.  No W/R/R. Abdomen:  Soft, non-distended. Normal bowel sounds.  Mild diffuse TTP. Extremities:  Without edema. Neurologic:  Alert and oriented x 4;  grossly normal neurologically. Psych:  Alert and cooperative. Normal mood and affect.  Intake/Output from previous day: 01/26 0701 - 01/27 0700 In: 3600 [I.V.:3600] Out: 3755 [Urine:3105; Emesis/NG output:650]  Lab Results:  Recent Labs  05/18/15 2140 05/20/15 0505 05/21/15 0523  WBC 14.8* 13.7* 14.8*  HGB 12.2* 11.1* 11.5*  HCT 39.0 35.7* 36.8*  PLT 331 334 328   BMET  Recent Labs  05/18/15 2140 05/20/15 0505 05/21/15 0523  NA 137 139 136  K 4.1 4.2 4.0  CL 101 108 105  CO2 26 22 21*  GLUCOSE 122* 90 72  BUN 16 17 19   CREATININE 1.15 0.98 0.93  CALCIUM 8.6* 8.3* 8.1*   LFT  Recent Labs  05/18/15 2140  PROT 6.6  ALBUMIN 3.1*  AST 21  ALT 19  ALKPHOS 98  BILITOT 0.6   Assessment / Plan: *GOO due to metastatic bladder cancer:  EGD 1/26 showed Class D esophagitis, retained solid and liquid gastric contents, and long segment of stenosis/stricture in D2-D3 from extrinsic compression.  NGT was placed for decompression.  Will obtain UGI series today to assess severity and length of strictured area to see if it would be amenable to stenting.   LOS: 3 days   ZEHR, JESSICA D.  05/21/2015, 9:05 AM  Pager number [phone]     Attending physician's note   I have taken an interval history, reviewed the chart and  examined the patient. I agree with the Advanced Practitioner's note, impression and recommendations. Symptoms improved with NGT decompression. UGI series via NGT to evaluate the length and severity of the duodenal stricture. Continue NGT suction.   Lucio Edward, MD [facility] [phone] Mon-Fri 8a-5p [phone] after 5p, weekends, holidays

## 2015-05-21 NOTE — Care Management Important Message (Signed)
Important Message  Patient Details  Name: Craig Waters. MRN: [id number] Date of Birth: 16-Feb-1931   Medicare Important Message Given:  Yes    Camillo Flaming 05/21/2015, 11:39 AMImportant Message  Patient Details  Name: Craig Waters. MRN: [id number] Date of Birth: 1931/03/06   Medicare Important Message Given:  Yes    Camillo Flaming 05/21/2015, 11:39 AM

## 2015-05-21 NOTE — Progress Notes (Signed)
PT Cancellation Note  Patient Details Name: Craig Waters. MRN: [id number] DOB: Apr 24, 1931   Cancelled Treatment:    Reason Eval/Treat Not Completed:Attempted PT eval-pt politely declined participation on today-not feeling up to moving around "I got all these tubes...not today". Will check back another day.    Weston Anna, MPT Pager: [phone]

## 2015-05-21 NOTE — Progress Notes (Addendum)
Patient ID: Craig Vives., male   DOB: June 21, 1930, 82 y.o.   MRN: [id number]  [facility] PROGRESS NOTE  Craig Guiles. [id number] DOB: 02-Jan-1931 DOA: 05/18/2015 PCP: [name], MD   Brief narrative:    82 y.o. male. Hx BPH, s/p radical prostatectomy. Hx A fib. DVT, s/p IVC filter, unable to take [facility] due to hematuria, stage 4, invasive urothelial bladder cancer, TURBT resection 02/28/2015 (high grade, poor prognosis per onc at [place]), s/p nephrostomy and ureteral tubes, recent admission 1/12 - 05/11/15 with UTI. bil perc nephrostomy tubes 05/08/15,  Rad tx by Dr Tammi Klippel 1/19, scheduled for 1/26. Pt presented to [facility] ED with concern he is unable to take anything PO and was vomiting.   CT abdomen done in ED, notable for GOO, gastric distention, fluid filled stomach to D2.Multiple attempts at placing NGT unsuccessful. GI consulted for assistance.  Assessment/Plan:    Principal Problem:   Gastric outlet obstruction - in the setting of metastatic bladder cancer  - EGD 1/26 showed Class D esophagitis, retained solid and liquid gastric contents, and long segment of stenosis/stricture in D2-D3 from extrinsic compression - NGT was placed for decompression - needs UGI series today to assess severity and length of strictured area to see if it would be amenable to stenting - appreciate GI team assistance  - continue Protonix IV - PCT also on board   Active Problems:   Urothelial carcinoma ([place]), stage IV - with mets to liver and abd - d/w Dr Lindi Adie - no role for chemo at this time, palliative and hospice recommended  - ? Venting gastrostomy recommended if needed but per GI, not recommended at this time     Leukocytosis - no clear signs of an infectious etiology, possibly from GOO - repeat CBC In AM    Malnutrition of moderate degree - in the setting of the above - NPO for now until GOO resolved     Hypertensive urgency  - place on Hydralazine as needed      History of recent DVT - Patient is status post IVC filter placement - Patient is not anticoagulated secondary to ongoing hematuria    Atrial Fibrillation - CHA2DS2-VASc Score is 3, needs oral anticoagulation, but patient is no on AC at home due to hematuria - hold Cardizem and amiodarone as pt unable to take PO     CKD-III - Baseline Cr is 1.66 - Cr has been WNL since admission       RA - on Prednisone 5 mg PO at home, converted to Solumedrol while NPO  DVT prophylaxis - Lovenox SQ  Code Status: Full.  Family Communication:  plan of care discussed with the patient and daughter over the phone  Disposition Plan: Home when acute issues resolved and GI team clears   IV access:  Peripheral IV  Procedures and diagnostic studies:     Ct Abdomen Pelvis W Contrast 05/18/2015  There appears to be gastric outlet obstruction with distended fluid-filled stomach to the level of the second portion the duodenum. There is retroperitoneal infiltration or scarring which may be causing gastric outlet obstruction. Duodenal mass or inflammation not excluded. Diffuse retroperitoneal inflammation or scarring most prominent on the right extending down to the pelvis. Bilateral percutaneous nephrostomy tubes and left ureteral tube. Diffuse asymmetric bladder wall thickening, likely neoplastic. Loss of distinction between the anterior bladder wall and anterior abdominal wall suggests direct infiltration versus scarring. Metastatic lesion in the right lobe of the liver appears unchanged since  prior study.   Ir Nephrostomy Placement Left 05/09/2015  Successful bilateral nephrostomy catheter placement for ureteral obstruction.  Ir Nephrostomy Placement Right 05/09/2015   Successful bilateral nephrostomy catheter placement for ureteral obstruction.   Medical Consultants:  GI PCT Surgery over the phone Oncology Dr. Lindi Adie   Other Consultants:  None   IAnti-Infectives:   None  Faye Ramsay,  MD  [facility] Pager [phone]  If 7PM-7AM, please contact night-coverage [email or web address] Password [email or web address] 05/21/2015, 7:03 AM   LOS: 3 days   HPI/Subjective: No events overnight.   Objective: Filed Vitals:   05/20/15 1430 05/20/15 1507 05/20/15 2143 05/21/15 0456  BP: 159/59 164/77 154/72 192/94  Pulse: 83 85 93 92  Temp:   98.4 F (36.9 C) 98.4 F (36.9 C)  TempSrc:   Oral Oral  Resp: 16 15 16 16   Height:      Weight:      SpO2: 95% 96% 96% 96%    Intake/Output Summary (Last 24 hours) at 05/21/15 0703 Last data filed at 05/21/15 0457  Gross per 24 hour  Intake 1952.08 ml  Output   3105 ml  Net -1152.92 ml    Exam:   General:  Pt is alert, follows commands appropriately, not in acute distress  Cardiovascular: Regular rate and rhythm,  no rubs, no gallops  Respiratory: Clear to auscultation bilaterally, no wheezing, no crackles, no rhonchi  Abdomen: Soft, distended, no guarding  Data Reviewed: Basic Metabolic Panel:  Recent Labs Lab 05/18/15 2140 05/20/15 0505 05/21/15 0523  NA 137 139 136  K 4.1 4.2 4.0  CL 101 108 105  CO2 26 22 21*  GLUCOSE 122* 90 72  BUN 16 17 19   CREATININE 1.15 0.98 0.93  CALCIUM 8.6* 8.3* 8.1*   Liver Function Tests:  Recent Labs Lab 05/18/15 2140  AST 21  ALT 19  ALKPHOS 98  BILITOT 0.6  PROT 6.6  ALBUMIN 3.1*    Recent Labs Lab 05/18/15 2140  LIPASE 42   CBC:  Recent Labs Lab 05/18/15 2140 05/20/15 0505 05/21/15 0523  WBC 14.8* 13.7* 14.8*  HGB 12.2* 11.1* 11.5*  HCT 39.0 35.7* 36.8*  MCV 92.0 91.8 90.6  PLT 331 334 328   Scheduled Meds: . enoxaparin (LOVENOX) injection  40 mg Subcutaneous Q24H  . methylPREDNISolone (SOLU-MEDROL) injection  20 mg Intravenous Daily  . pantoprazole (PROTONIX) IV  40 mg Intravenous Q12H   Continuous Infusions: . sodium chloride 125 mL/hr at 05/21/15 0106

## 2015-05-22 DIAGNOSIS — R112 Nausea with vomiting, unspecified: Secondary | ICD-10-CM

## 2015-05-22 DIAGNOSIS — Z86718 Personal history of other venous thrombosis and embolism: Secondary | ICD-10-CM

## 2015-05-22 DIAGNOSIS — N183 Chronic kidney disease, stage 3 (moderate): Secondary | ICD-10-CM

## 2015-05-22 DIAGNOSIS — Z66 Do not resuscitate: Secondary | ICD-10-CM | POA: Diagnosis present

## 2015-05-22 DIAGNOSIS — K315 Obstruction of duodenum: Secondary | ICD-10-CM

## 2015-05-22 LAB — CBC
HEMATOCRIT: 38.3 % — AB (ref 39.0–52.0)
Hemoglobin: 12.1 g/dL — ABNORMAL LOW (ref 13.0–17.0)
MCH: 28.7 pg (ref 26.0–34.0)
MCHC: 31.6 g/dL (ref 30.0–36.0)
MCV: 91 fL (ref 78.0–100.0)
Platelets: 288 10*3/uL (ref 150–400)
RBC: 4.21 MIL/uL — ABNORMAL LOW (ref 4.22–5.81)
RDW: 14.8 % (ref 11.5–15.5)
WBC: 13.2 10*3/uL — ABNORMAL HIGH (ref 4.0–10.5)

## 2015-05-22 LAB — BASIC METABOLIC PANEL
ANION GAP: 13 (ref 5–15)
BUN: 20 mg/dL (ref 6–20)
CALCIUM: 8.4 mg/dL — AB (ref 8.9–10.3)
CO2: 19 mmol/L — AB (ref 22–32)
Chloride: 105 mmol/L (ref 101–111)
Creatinine, Ser: 1.1 mg/dL (ref 0.61–1.24)
GFR calc non Af Amer: >60 mL/min (ref >60)
GLUCOSE: 43 mg/dL — AB (ref 65–99)
POTASSIUM: 4.2 mmol/L (ref 3.5–5.1)
Sodium: 137 mmol/L (ref 135–145)

## 2015-05-22 LAB — GLUCOSE, CAPILLARY: Glucose-Capillary: 128 mg/dL — ABNORMAL HIGH (ref 65–99)

## 2015-05-22 MED ORDER — DEXTROSE 50 % IV SOLN
INTRAVENOUS | Status: AC
  Filled 2015-05-22: qty 50

## 2015-05-22 MED ORDER — DEXTROSE 50 % IV SOLN
25.0000 mL | Freq: Once | INTRAVENOUS | Status: AC
  Administered 2015-05-22: 25 mL via INTRAVENOUS
  Filled 2015-05-22: qty 50

## 2015-05-22 NOTE — Progress Notes (Signed)
Hypoglycemic Event  CBG: 43  Treatment: D50 IV 25 mL  Symptoms: None  Follow-up CBG: [id number] CBG Result:128  Possible Reasons for Event: Inadequate meal intake  Comments/MD notified:Devine    Lenise Herald

## 2015-05-22 NOTE — Progress Notes (Signed)
PT Cancellation Note  Patient Details Name: Craig Waters. MRN: [id number] DOB: 1930/11/26   Cancelled Treatment:    Reason Eval/Treat Not Completed: Attempted PT eval-pt declined to participate on today. Pt states he would like for [place] to continue to check back. Will check back another day.    Weston Anna, MPT Pager: [phone]

## 2015-05-22 NOTE — Progress Notes (Addendum)
Patient ID: Craig Morgret., male   DOB: 08/09/1930, 82 y.o.   MRN: [id number] [facility] PROGRESS NOTE  Craig Waters. [id number] DOB: 1931/04/12 DOA: 05/18/2015 PCP: [name], MD  Brief narrative:    82 y.o. male. Hx BPH, s/p radical prostatectomy. Hx A fib. DVT, s/p IVC filter, unable to take [facility] due to hematuria, stage 4, invasive urothelial bladder cancer, TURBT resection 02/28/2015 (high grade, poor prognosis per onc at [place]), s/p nephrostomy and ureteral tubes, recent admission 1/12 - 05/11/15 with UTI. bil percutaneous nephrostomy tubes 05/08/15, Rad tx by Dr Tammi Klippel. Pt was found to have gastric outlet obstruction on this admission. He has NG tube and per GI possible stent placement 1/30.  Assessment/Plan:     Principal Problem:  Gastric outlet obstruction / Nausea and vomiting - Due to metastatic bladder cancer  - EGD 1/26 showed Class D esophagitis, retained solid and liquid gastric contents and long segment of stenosis/stricture in D2-D3 from extrinsic compression - NGT was placed for decompression - GI has seen the pt in consultation - Possible stent placement 1/30 - Continue PPI therapy   Active Problems:  Urothelial carcinoma ([place]), stage IV - Follows with Dr Lindi Adie - Per oncology, no role for chemo at this time, palliative and hospice recommended    Leukocytosis - Likely due to steroids  - No evidence of acute infectious process    Malnutrition of moderate degree - In the context of chronic illness - NPO until GOO resolved    Hypertensive urgency  - Hydralazine as needed    History of recent DVT - Patient is status post IVC filter placement - Patient is not anticoagulated secondary to ongoing hematuria and risk of bleed   Atrial Fibrillation - CHA2DS2-VASc Score 3 - Not on AC due to risk of bleed - Rate controlled off of Cardizem     Anemia of chronic disease - Due to CKD and malignancy - Hemoglobin stable     CKD stage 3  - Baseline Cr is 1.66 - Cr now WNL    RA - On Prednisone 5 mg PO at home, converted to Solumedrol while NPO   DVT Prophylaxis  - Lovenox subQ  Code Status: DNR/DNI Family Communication:  Family not at the bedside this am  Disposition Plan: not yet stable for D/C, has ongoing obstruction, NGT.  IV access:  Peripheral IV  Procedures and diagnostic studies:    Ct Abdomen Pelvis W Contrast 05/18/2015  There appears to be gastric outlet obstruction with distended fluid-filled stomach to the level of the second portion the duodenum. There is retroperitoneal infiltration or scarring which may be causing gastric outlet obstruction. Duodenal mass or inflammation not excluded. Diffuse retroperitoneal inflammation or scarring most prominent on the right extending down to the pelvis. Bilateral percutaneous nephrostomy tubes and left ureteral tube. Diffuse asymmetric bladder wall thickening, likely neoplastic. Loss of distinction between the anterior bladder wall and anterior abdominal wall suggests direct infiltration versus scarring. Metastatic lesion in the right lobe of the liver appears unchanged since prior study. Electronically Signed   By: Lucienne Capers M.D.   On: 05/18/2015 23:31   Dg Duanne Limerick W/high Density W/kub 05/21/2015   7.5-8 cm long segment of narrowing of the second and third portions of the duodenum. The most severe area of narrowing is in the third portion of the duodenum over a 3.5-4 cm segment.   Medical Consultants:  Gastroenterology PCT Surgery over the phone Oncology Dr. Lindi Adie   Other Consultants:  None  IAnti-Infectives:   None    Leisa Lenz, MD  [facility] Pager [phone]  Time spent in minutes: 25 minutes  If 7PM-7AM, please contact night-coverage [email or web address] Password [email or web address] 05/22/2015, 1:26 PM   LOS: 4 days    HPI/Subjective: No acute overnight events. No vomiting.  Objective: Filed Vitals:   05/21/15 1324 05/21/15 1500  05/21/15 2225 05/22/15 0437  BP: 160/73 150/77 143/67 140/66  Pulse: 88  95 97  Temp: 98.1 F (36.7 C)  98.2 F (36.8 C) 98.3 F (36.8 C)  TempSrc: Oral  Oral Oral  Resp: 18  18 18   Height:      Weight:      SpO2: 98%  97% 97%    Intake/Output Summary (Last 24 hours) at 05/22/15 1326 Last data filed at 05/22/15 1120  Gross per 24 hour  Intake 1012.5 ml  Output   2595 ml  Net -1582.5 ml    Exam:   General:  Pt is not in acute distress  Cardiovascular: Regular rate and rhythm, S1/S2 (+)  Respiratory: Clear to auscultation bilaterally, no wheezing, no crackles, no rhonchi  Abdomen: non tender, NGT in place  Extremities: No edema, pulses DP and PT palpable bilaterally  Neuro: Grossly nonfocal  Data Reviewed: Basic Metabolic Panel:  Recent Labs Lab 05/18/15 2140 05/20/15 0505 05/21/15 0523 05/22/15 0518  NA 137 139 136 137  K 4.1 4.2 4.0 4.2  CL 101 108 105 105  CO2 26 22 21* 19*  GLUCOSE 122* 90 72 43*  BUN 16 17 19 20   CREATININE 1.15 0.98 0.93 1.10  CALCIUM 8.6* 8.3* 8.1* 8.4*   Liver Function Tests:  Recent Labs Lab 05/18/15 2140  AST 21  ALT 19  ALKPHOS 98  BILITOT 0.6  PROT 6.6  ALBUMIN 3.1*    Recent Labs Lab 05/18/15 2140  LIPASE 42   No results for input(s): AMMONIA in the last 168 hours. CBC:  Recent Labs Lab 05/18/15 2140 05/20/15 0505 05/21/15 0523 05/22/15 0518  WBC 14.8* 13.7* 14.8* 13.2*  HGB 12.2* 11.1* 11.5* 12.1*  HCT 39.0 35.7* 36.8* 38.3*  MCV 92.0 91.8 90.6 91.0  PLT 331 334 328 288   Cardiac Enzymes: No results for input(s): CKTOTAL, CKMB, CKMBINDEX, TROPONINI in the last 168 hours. BNP: Invalid input(s): POCBNP CBG: No results for input(s): GLUCAP in the last 168 hours.  No results found for this or any previous visit (from the past 240 hour(s)).   Scheduled Meds: . dextrose      . enoxaparin (LOVENOX) injection  40 mg Subcutaneous Q24H  . methylPREDNISolone (SOLU-MEDROL) injection  20 mg Intravenous  Daily  . pantoprazole (PROTONIX) IV  40 mg Intravenous Q12H   Continuous Infusions: . sodium chloride 50 mL/hr at 05/21/15 2030

## 2015-05-22 NOTE — Progress Notes (Signed)
[facility] Progress Note  Subjective:  Says that he feels terrible.  Just has pain all over, mostly abdomen.  Says that he just wants to get pain medication whenever he can.  He understands our plan to discuss possible stenting.  Objective:  Vital signs in last 24 hours: Temp:  [98.1 F (36.7 C)-98.3 F (36.8 C)] 98.3 F (36.8 C) (01/28 0437) Pulse Rate:  [88-97] 97 (01/28 0437) Resp:  [18] 18 (01/28 0437) BP: (140-160)/(66-77) 140/66 mmHg (01/28 0437) SpO2:  [97 %-98 %] 97 % (01/28 0437) Last BM Date: 05/10/15 General:  Alert, well developed, in NAD Heart:  Regular rate and rhythm; no murmurs Pulm:  CTAB.  No W/R/R. Abdomen:  Soft, non-distended. Normal bowel sounds.  Diffuse TTP. Extremities:  Without edema. Neurologic:  Alert and oriented x 4;  grossly normal neurologically. Psych:  Alert and cooperative. Normal mood and affect.  Intake/Output from previous day: 01/27 0701 - 01/28 0700 In: 1012.5 [I.V.:1012.5] Out: 3250 [Urine:2150; Emesis/NG output:1100] Intake/Output this shift: Total I/O In: -  Out: 295 [Urine:295]  Lab Results:  Recent Labs  05/20/15 0505 05/21/15 0523 05/22/15 0518  WBC 13.7* 14.8* 13.2*  HGB 11.1* 11.5* 12.1*  HCT 35.7* 36.8* 38.3*  PLT 334 328 288   BMET  Recent Labs  05/20/15 0505 05/21/15 0523 05/22/15 0518  NA 139 136 137  K 4.2 4.0 4.2  CL 108 105 105  CO2 22 21* 19*  GLUCOSE 90 72 43*  BUN 17 19 20   CREATININE 0.98 0.93 1.10  CALCIUM 8.3* 8.1* 8.4*   Dg Ugi W/high Density W/kub  05/21/2015  CLINICAL DATA:  Duodenal obstruction.  Metastatic bladder cancer. EXAM: UPPER GI SERIES WITHOUT KUB TECHNIQUE: Routine upper GI series was performed with thin barium and water-soluble contrast. FLUOROSCOPY TIME:  Fluoroscopy Time (in minutes and seconds): 3 minutes 44 seconds COMPARISON:  CT scan dated 05/18/2015 FINDINGS: Thin barium was instilled in the duodenal ball of the via the indwelling nasogastric tube.  There was slow passage of contrast into the distal second portion of the duodenum and gradually into the third portion of the duodenum. With small amount water-soluble contrast was also introduced. After 8 minutes contrast had passed through the duodenum to just beyond the ligament of Treitz. There is a marked narrowing of the third portion of the duodenum over a ~3.5-4.0 cm segment. There is slight narrowing of second portion of the duodenum over the ~4 cm proximal to the severe narrowing. The fourth portion of the duodenum the proximal jejunum beyond the ligament of Treitz appear normal. The visualized portion of the stomach and pylorus and duodenal bulb are normal. IMPRESSION: 7.5-8 cm long segment of narrowing of the second and third portions of the duodenum. The most severe area of narrowing is in the third portion of the duodenum over a 3.5-4 cm segment. Electronically Signed   By: Lorriane Shire M.D.   On: 05/21/2015 11:24   Assessment / Plan: *GOO due to metastatic bladder cancer: EGD 1/26 showed Class D esophagitis, retained solid and liquid gastric contents, and long segment of stenosis/stricture in D2-D3 from extrinsic compression. NGT was placed for decompression. UGI better defined areas of narrowing.  Plan is to discuss with Dr. Ardis Hughs on Monday morning to see if he is a good candidate for stenting.  Continue NG decompression until then.  Will return to see him on Monday after that discussion.   LOS: 4 days   ZEHR, JESSICA D.  05/22/2015,  11:34 AM  Pager number [phone]    Attending physician's note   I have taken an interval history, reviewed the chart and examined the patient. I agree with the Advanced Practitioner's note, impression and recommendations. Barium study via NGT reviewed. The duodenal stricture is about 8 cm long and the most severe part of the stenosis is 4 cm long. The duodenal stricture could be amenable to stenting however I want to review this with colleagues on  Monday for input and opinions. Continue NGT suction over the weekend.   Lucio Edward, MD [facility] [phone] Mon-Fri 8a-5p [phone] after 5p, weekends, holidays

## 2015-05-23 DIAGNOSIS — I160 Hypertensive urgency: Secondary | ICD-10-CM

## 2015-05-23 NOTE — Progress Notes (Addendum)
Patient ID: Craig Welden., male   DOB: 12-04-1930, 82 y.o.   MRN: [id number] [facility] PROGRESS NOTE  Craig Guiles. [id number] DOB: [birth year] DOA: 05/18/2015 PCP: [name], MD  Brief narrative:    82 y.o. male. Hx BPH, s/p radical prostatectomy. Hx A fib. DVT, s/p IVC filter, unable to take [facility] due to hematuria, stage 4, invasive urothelial bladder cancer, TURBT resection 02/28/2015 (high grade, poor prognosis per onc at [facility]), s/p nephrostomy and ureteral tubes, recent admission 1/12 - 05/11/15 with UTI. bil percutaneous nephrostomy tubes 05/08/15, Rad tx by Dr Tammi Klippel. Pt was found to have gastric outlet obstruction on this admission. He has NG tube and per GI possible stent placement 1/30.  Assessment/Plan:     Principal Problem:  Gastric outlet obstruction / Nausea and vomiting - In the setting of metastatic bladder cancer - EGD 1/26 showed Class D esophagitis, retained solid and liquid gastric contents and long segment of stenosis/stricture in D2-D3 from extrinsic compression - Continue supportive care with NG tube for decompression - Patient had upper GI series with results showing about 8 cm long segment of narrowing of the second and third portion of the duodenum with the most severe area in the third portion over 3.5 cm segment. - Hopefully GI will be able to place stents we can remove NG tube. - Continue Protonix 40 mg IV every 12 hours  Active Problems:  Urothelial carcinoma ([place]), stage IV - Follows with Dr Lindi Adie - Per oncology, no role for chemo at this time, palliative and hospice recommended    Leukocytosis - Likely due to steroids  - No evidence of acute infectious process    Severe protein calorie malnutrition - In the context of chronic illness - Please note patient is not eating anything, he is nothing by mouth because of gastric outlet obstruction. Other than IV fluids he is not receiving any food  - Because of ongoing  obstruction patient is nothing by mouth   Hypertensive urgency / essential hypertension - Patient initially on hydralazine as needed for blood pressure above 150/90 - Will add metoprolol 5 mg twice daily IV - Blood pressure currently 146/66   History of recent DVT - Patient is status post IVC filter placement - Patient is not anticoagulated secondary to ongoing hematuria and risk of bleed   Atrial Fibrillation - CHA2DS2-VASc Score 3 - Not on AC due to risk of bleed - He takes Cardizem at home but this was placed on hold since he is nothing by mouth. We will place patient on low dose metoprolol twice daily.    Anemia of chronic disease - Secondary to combination of chronic kidney disease and malignancy - Hemoglobin remains stable at 12.1 - Check CBC tomorrow morning   Chronic kidney disease stage 3  - Baseline Cr is 1.66 - Creatinine is within normal limits during this admission    Rheumatoid arthritis - On Prednisone 5 mg PO at home, converted to Solumedrol while NPO   DVT Prophylaxis  - Lovenox subQ while patient in hospital  Code Status: DNR/DNI Family Communication:  Family not at the bedside this am  Disposition Plan: Patient has ongoing obstruction, has NG tube. He is not stable for discharge at this time. We first need to determine if stent can be placed so we can remove NG tube that we would have more idea when he will be able to go home.  IV access:  Peripheral IV  Procedures and diagnostic studies:  Ct Abdomen Pelvis W Contrast 05/18/2015  There appears to be gastric outlet obstruction with distended fluid-filled stomach to the level of the second portion the duodenum. There is retroperitoneal infiltration or scarring which may be causing gastric outlet obstruction. Duodenal mass or inflammation not excluded. Diffuse retroperitoneal inflammation or scarring most prominent on the right extending down to the pelvis. Bilateral percutaneous nephrostomy tubes and  left ureteral tube. Diffuse asymmetric bladder wall thickening, likely neoplastic. Loss of distinction between the anterior bladder wall and anterior abdominal wall suggests direct infiltration versus scarring. Metastatic lesion in the right lobe of the liver appears unchanged since prior study. Electronically Signed   By: Lucienne Capers M.D.   On: 05/18/2015 23:31   Dg Duanne Limerick W/high Density W/kub 05/21/2015   7.5-8 cm long segment of narrowing of the second and third portions of the duodenum. The most severe area of narrowing is in the third portion of the duodenum over a 3.5-4 cm segment.   Medical Consultants:  Gastroenterology PCT Surgery over the phone Oncology Dr. Lindi Adie   Other Consultants:  None  IAnti-Infectives:   None    Leisa Lenz, MD  [facility] Pager [phone]  Time spent in minutes: 25 minutes  If 7PM-7AM, please contact night-coverage [email or web address] Password [facility] 05/23/2015, 11:52 AM   LOS: 5 days    HPI/Subjective: No acute overnight events. No reports of vomiting. He hopes stent can be placed.   Objective: Filed Vitals:   05/22/15 1406 05/22/15 1411 05/22/15 2053 05/23/15 0641  BP: 186/95 186/95 150/77 146/66  Pulse: 107 117 95 97  Temp: 98.3 F (36.8 C) 98.3 F (36.8 C) 98.1 F (36.7 C) 98.1 F (36.7 C)  TempSrc: Oral Oral Oral Oral  Resp:   20 20  Height:      Weight:      SpO2: 97% 97% 95% 97%    Intake/Output Summary (Last 24 hours) at 05/23/15 1152 Last data filed at 05/23/15 0641  Gross per 24 hour  Intake 1842.5 ml  Output   2275 ml  Net -432.5 ml    Exam:   General:  Pt is awake, no distress  Cardiovascular: Rate controlled, appreciate S1, S2   Respiratory: No wheezing, no crackles, no rhonchi  Abdomen: has NG tube, Non tender   Extremities: No edema, pulses palpable   Neuro: Nonfocal  Data Reviewed: Basic Metabolic Panel:  Recent Labs Lab 05/18/15 2140 05/20/15 0505 05/21/15 0523 05/22/15 0518  NA 137  139 136 137  K 4.1 4.2 4.0 4.2  CL 101 108 105 105  CO2 26 22 21* 19*  GLUCOSE 122* 90 72 43*  BUN 16 17 19 20   CREATININE 1.15 0.98 0.93 1.10  CALCIUM 8.6* 8.3* 8.1* 8.4*   Liver Function Tests:  Recent Labs Lab 05/18/15 2140  AST 21  ALT 19  ALKPHOS 98  BILITOT 0.6  PROT 6.6  ALBUMIN 3.1*    Recent Labs Lab 05/18/15 2140  LIPASE 42   No results for input(s): AMMONIA in the last 168 hours. CBC:  Recent Labs Lab 05/18/15 2140 05/20/15 0505 05/21/15 0523 05/22/15 0518  WBC 14.8* 13.7* 14.8* 13.2*  HGB 12.2* 11.1* 11.5* 12.1*  HCT 39.0 35.7* 36.8* 38.3*  MCV 92.0 91.8 90.6 91.0  PLT 331 334 328 288   Cardiac Enzymes: No results for input(s): CKTOTAL, CKMB, CKMBINDEX, TROPONINI in the last 168 hours. BNP: Invalid input(s): POCBNP CBG:  Recent Labs Lab 05/22/15 0818  GLUCAP 128*    No results  found for this or any previous visit (from the past 240 hour(s)).   Scheduled Meds: . enoxaparin (LOVENOX) injection  40 mg Subcutaneous Q24H  . methylPREDNISolone (SOLU-MEDROL) injection  20 mg Intravenous Daily  . pantoprazole (PROTONIX) IV  40 mg Intravenous Q12H   Continuous Infusions: . sodium chloride 50 mL/hr at 05/23/15 1118

## 2015-05-24 ENCOUNTER — Ambulatory Visit
Admit: 2015-05-24 | Discharge: 2015-05-24 | Disposition: A | Discharge status: Home / Self Care | Payer: Medicare Other | Attending: Radiation Oncology | Admitting: Radiation Oncology

## 2015-05-24 ENCOUNTER — Ambulatory Visit: Payer: Medicare Other | Admitting: Radiation Oncology

## 2015-05-24 ENCOUNTER — Ambulatory Visit: Payer: Medicare Other

## 2015-05-24 DIAGNOSIS — D72829 Elevated white blood cell count, unspecified: Secondary | ICD-10-CM

## 2015-05-24 MED ORDER — HYDROMORPHONE HCL 1 MG/ML IJ SOLN
1.0000 mg | INTRAMUSCULAR | Status: DC | PRN
  Administered 2015-05-24 (×3): 1 mg via INTRAVENOUS
  Administered 2015-05-25 (×3): 2 mg via INTRAVENOUS
  Administered 2015-05-25: 1 mg via INTRAVENOUS
  Administered 2015-05-25: 2 mg via INTRAVENOUS
  Administered 2015-05-25: 1 mg via INTRAVENOUS
  Administered 2015-05-26 (×4): 2 mg via INTRAVENOUS
  Filled 2015-05-24 (×3): qty 2
  Filled 2015-05-24 (×2): qty 1
  Filled 2015-05-24 (×4): qty 2
  Filled 2015-05-24 (×2): qty 1
  Filled 2015-05-24: qty 2
  Filled 2015-05-24 (×2): qty 1

## 2015-05-24 MED ORDER — METOPROLOL TARTRATE 1 MG/ML IV SOLN
5.0000 mg | Freq: Two times a day (BID) | INTRAVENOUS | Status: DC
  Administered 2015-05-24 – 2015-06-02 (×19): 5 mg via INTRAVENOUS
  Filled 2015-05-24 (×16): qty 5

## 2015-05-24 MED ORDER — BOOST / RESOURCE BREEZE PO LIQD
1.0000 | Freq: Three times a day (TID) | ORAL | Status: DC
  Administered 2015-05-24 – 2015-05-30 (×6): 1 via ORAL

## 2015-05-24 NOTE — Care Management Note (Signed)
Case Management Note  Patient Details  Name: Craig Waters. MRN: [id number] Date of Birth: 1931/01/17  Subjective/Objective:[facility] following.Noted palliative care following.                    Action/Plan:d/c plan home.   Expected Discharge Date:                  Expected Discharge Plan:  [place]  In-House Referral:     Discharge planning Services  CM Consult  Post Acute Care Choice:  Home Health (Active [facility]-HHRN/PT) Choice offered to:     DME Arranged:    DME Agency:     HH Arranged:    HH Agency:     Status of Service:  In process, will continue to follow  Medicare Important Message Given:  Yes Date Medicare IM Given:    Medicare IM give by:    Date Additional Medicare IM Given:    Additional Medicare Important Message give by:     If discussed at [place] of Stay Meetings, dates discussed:    Additional Comments:  Dessa Phi, RN 05/24/2015, 3:24 PM

## 2015-05-24 NOTE — Progress Notes (Signed)
Nutrition Follow-up  DOCUMENTATION CODES:   Non-severe (moderate) malnutrition in context of acute illness/injury  INTERVENTION:  - Will order Boost Breeze po TID, each supplement provides 250 kcal and 9 grams of protein - Continue diet advancement as medically feasible - RD will continue to monitor for needs  NUTRITION DIAGNOSIS:   Inadequate oral intake related to inability to eat as evidenced by NPO status. -advanced to CLD this AM with very minimal intake  GOAL:   Patient will meet greater than or equal to 90% of their needs -unmet  MONITOR:   PO intake, Supplement acceptance, Diet advancement, Weight trends, Labs, Skin, I & O's  ASSESSMENT:   82 y.o. male with h/o stage 4 bladder cancer, mets to liver, abdomen. Patient presents to the ED with 3 day history of moderate abdominal pain, N/V, and dry heaving. Unable to tolerate anything PO. Essentially no BM at all during this period. This has been worsening. He has tried nothing for symptoms.  1/30 Pt has been NPO since previous assessment until advancement to CLD this AM at 0903. Pt states that he drank a Coke and "felt wonderful" after this; no other intakes since advancement. Pt states ongoing severe abdominal pain. Pt requested pain medication during visit and RD alerted RN to request. Pt has NGT in place with 300cc output this shift. Per chart and round this AM, pt to have EGD tomorrow (1/31) and possible stent placement.   Not meeting needs. Will order supplement and continue to monitor for POC and needs. Medications reviewed. Labs reviewed; Ca: 8.4 mg/dL.    1/25 - Pt has been NPO since admission.  - Several attempts to place NGT at bedside have been unsuccessful.  - Talked with pt as he was lying in bed and no one was at bedside.  - He states that he is miserable and is hoping to receive relief soon from abdominal discomfort.  - He states that he last ate yesterday (1/2 hamburger) and that this did not cause any  increase in nausea or in feelings of abdominal pressure.  - Pt states that abdominal pressure has been ongoing for several days and has caused associated nausea.  - He has attempted to vomit to relieve this but has been unsuccessful. - Saw RN note from this AM at [phone]. Will continue to monitor for GOC and plan during hospitalization. - Pt reports UBW of 205 lbs and that he last weighed this 3 weeks ago.  - Per chart review, pt has lost 3 lbs (1.5% body weight) in the past 1 week which is significant for time frame.  - Mild muscle and fat wasting noted to upper body; unable to assess lower body.   Diet Order:  Diet clear liquid Room service appropriate?: Yes; Fluid consistency:: Thin Diet NPO time specified  Skin:  Reviewed, no issues  Last BM:  1/23  Height:   Ht Readings from Last 1 Encounters:  05/19/15 5\' 11"  (1.803 m)    Weight:   Wt Readings from Last 1 Encounters:  05/19/15 202 lb 6.1 oz (91.8 kg)    Ideal Body Weight:  78.18 kg (kg)  BMI:  Body mass index is 28.24 kg/(m^2).  Estimated Nutritional Needs:   Kcal:  2100-2300  Protein:  85-100 grams  Fluid:  2-2.3 L/day  EDUCATION NEEDS:   No education needs identified at this time     Jarome Matin, RD, LDN Inpatient Clinical Dietitian Pager # [phone] After hours/weekend pager # [phone]

## 2015-05-24 NOTE — Progress Notes (Signed)
[facility] Progress Note    Since last GI note: He is uncomfortable, laying in bed with NG tube draining.  Bilateral nephrostomy tubes.  Objective: Vital signs in last 24 hours: Temp:  [98 F (36.7 C)-98.4 F (36.9 C)] 98.4 F (36.9 C) (01/30 0507) Pulse Rate:  [89-101] 101 (01/30 0507) Resp:  [18-20] 18 (01/30 0507) BP: (159-172)/(77-81) 172/78 mmHg (01/30 0507) SpO2:  [96 %-98 %] 98 % (01/30 0507) Last BM Date: 05/17/15 General: alert and oriented times 3 Heart: regular rate and rythm Abdomen: soft, non-tender, non-distended, normal bowel sounds   Lab Results:  Recent Labs  05/22/15 0518  WBC 13.2*  HGB 12.1*  PLT 288  MCV 91.0    Recent Labs  05/22/15 0518  NA 137  K 4.2  CL 105  CO2 19*  GLUCOSE 43*  BUN 20  CREATININE 1.10  CALCIUM 8.4*   No results for input(s): PROT, ALBUMIN, AST, ALT, ALKPHOS, BILITOT, BILIDIR, IBILI in the last 72 hours. No results for input(s): INR in the last 72 hours.   Studies/Results: No results found.   Medications: Scheduled Meds: . enoxaparin (LOVENOX) injection  40 mg Subcutaneous Q24H  . methylPREDNISolone (SOLU-MEDROL) injection  20 mg Intravenous Daily  . pantoprazole (PROTONIX) IV  40 mg Intravenous Q12H   Continuous Infusions: . sodium chloride 50 mL/hr at 05/23/15 1118   PRN Meds:.fentaNYL (SUBLIMAZE) injection, hydrALAZINE, HYDROmorphone (DILAUDID) injection, ondansetron (ZOFRAN) IV, ondansetron (ZOFRAN) IV    Assessment/Plan: 82 y.o. male with metastatic bladder cancer that is causing renal obstruction and gastric outlet obstruction  He understands risks of duodenal stenting (including perforation, bleeding, migration) and wants to proceed.  I will start to coordinate with anesthesia, endoscopy and stent company.  Likely this procedure will be Tues or Wednesday. Will have to hold lovenox appropriately as well.  He really want a soda and this is safe. I will change his diet orders for  liquids only.  Should keep NG tube in place.  Primary team to address the discomfort at his nephrotomy tube sites, may need IR or urology input on these devises.    Milus Banister, MD  05/24/2015, 8:58 AM [facility] Pager [phone]

## 2015-05-24 NOTE — Progress Notes (Signed)
Patient ID: Craig Waters., male   DOB: 1930/06/18, 82 y.o.   MRN: [id number] [facility] PROGRESS NOTE  Craig Waters. [id number] DOB: 08/08/1930 DOA: 05/18/2015 PCP: [name], MD  Brief narrative:    82 y.o. male. Hx BPH, s/p radical prostatectomy. Hx A fib. DVT, s/p IVC filter, unable to take [facility] due to hematuria, stage 4, invasive urothelial bladder cancer, TURBT resection 02/28/2015 (high grade, poor prognosis per onc at [place]), s/p nephrostomy and ureteral tubes, recent admission 1/12 - 05/11/15 with UTI. bil percutaneous nephrostomy tubes 05/08/15, Rad tx by Dr Tammi Klippel. Pt was found to have gastric outlet obstruction on this admission. He has NG tube and per GI possible stent placement Tuesday or Wednesday.   Assessment/Plan:    Principal Problem:  Gastric outlet obstruction / Nausea and vomiting - Likely secondary to metastatic bladder cancer - EGD 1/26 showed Class D esophagitis, retained solid and liquid gastric contents and long segment of stenosis/stricture in D2-D3 from extrinsic compression - Continue NG tube for decompression - Upper GI series demonstrated about 8 cm long segment of narrowing of the second and third portion of the duodenum with the most severe area in the third portion over 3.5 cm segment. - Plan for stent placement in next 1-2 days  - Continue Protonix 40 mg IV every 12 hours  Active Problems:  Urothelial carcinoma ([place]), stage IV - Follows with Dr Lindi Adie - Per oncology, no role for chemo at this time, palliative and hospice recommended  - Appreciate palliative care recommendations    Ureteral obstruction - Status post bilateral nephrostomy tubes placement 05/08/2015 by urology - Has some pain at the nephrostomy tube site which is likely related to malignancy considering recent CT scan showed that these tubes are in good position - Continue pain management efforts   Leukocytosis - Likely related to steroids - No  evidence of acute infectious process    Severe protein calorie malnutrition - In the context of chronic illness - Patient is nothing by mouth because of gastric outlet obstruction   Hypertensive urgency / essential hypertension - Patient initially on hydralazine as needed for blood pressure above 150/90 - Added metoprolol 5 mg twice daily IV   History of recent DVT - Patient is status post IVC filter placement - Patient is not anticoagulated secondary to ongoing hematuria and risk of bleed   Atrial Fibrillation - CHA2DS2-VASc Score 3 - Not on AC due to risk of bleed - Started low dose metoprolol IV 5 mg Q 12 hours for rate and BP control     Anemia of chronic disease - Secondary to combination of chronic kidney disease and malignancy - Hemoglobin stable - CBC in am   Chronic kidney disease stage 3  - Baseline Cr is 1.66 - Creatinine WNL on this admission     Rheumatoid arthritis - On Prednisone 5 mg PO at home which is converted to Solumedrol while NPO   DVT Prophylaxis  - Lovenox subQ   Code Status: DNR/DNI Family Communication:  Family not at the bedside this am  Disposition Plan: Patient has ongoing obstruction, has NG tube. He is not stable for discharge at this time.  IV access:  Peripheral IV  Procedures and diagnostic studies:     Ct Abdomen Pelvis W Contrast 05/18/2015  There appears to be gastric outlet obstruction with distended fluid-filled stomach to the level of the second portion the duodenum. There is retroperitoneal infiltration or scarring which may be causing gastric outlet  obstruction. Duodenal mass or inflammation not excluded. Diffuse retroperitoneal inflammation or scarring most prominent on the right extending down to the pelvis. Bilateral percutaneous nephrostomy tubes and left ureteral tube. Diffuse asymmetric bladder wall thickening, likely neoplastic. Loss of distinction between the anterior bladder wall and anterior abdominal wall  suggests direct infiltration versus scarring. Metastatic lesion in the right lobe of the liver appears unchanged since prior study. Electronically Signed   By: Lucienne Capers M.D.   On: 05/18/2015 23:31   Dg Duanne Limerick W/high Density W/kub 05/21/2015   7.5-8 cm long segment of narrowing of the second and third portions of the duodenum. The most severe area of narrowing is in the third portion of the duodenum over a 3.5-4 cm segment.   Medical Consultants:  Gastroenterology PCT Surgery over the phone Oncology Dr. Lindi Adie   Other Consultants:  None  IAnti-Infectives:   None    Leisa Lenz, MD  [facility] Pager [phone]  Time spent in minutes: 25 minutes  If 7PM-7AM, please contact night-coverage [email or web address] Password [email or web address] 05/24/2015, 11:24 AM   LOS: 6 days    HPI/Subjective: No acute overnight events. Pain at the nephrostomy tubes site.  Objective: Filed Vitals:   05/23/15 0641 05/23/15 1408 05/23/15 2157 05/24/15 0507  BP: 146/66 159/81 162/77 172/78  Pulse: 97 89 98 101  Temp: 98.1 F (36.7 C) 98 F (36.7 C) 98 F (36.7 C) 98.4 F (36.9 C)  TempSrc: Oral Oral Oral Oral  Resp: 20 20 20 18   Height:      Weight:      SpO2: 97% 96% 98% 98%    Intake/Output Summary (Last 24 hours) at 05/24/15 1124 Last data filed at 05/24/15 0600  Gross per 24 hour  Intake   1320 ml  Output   1500 ml  Net   -180 ml    Exam:   General:  Pt is not in distress  Cardiovascular: tachycardic, appreciate S1, S2   Respiratory: Bilateral air entry, no wheezing  Abdomen: NG tube in place, nontender abdomen, tender at nephrostomy tube site  Extremities: No swelling, pulses palpable  Neuro: No focal deficit  Data Reviewed: Basic Metabolic Panel:  Recent Labs Lab 05/18/15 2140 05/20/15 0505 05/21/15 0523 05/22/15 0518  NA 137 139 136 137  K 4.1 4.2 4.0 4.2  CL 101 108 105 105  CO2 26 22 21* 19*  GLUCOSE 122* 90 72 43*  BUN 16 17 19 20   CREATININE 1.15 0.98 0.93  1.10  CALCIUM 8.6* 8.3* 8.1* 8.4*   Liver Function Tests:  Recent Labs Lab 05/18/15 2140  AST 21  ALT 19  ALKPHOS 98  BILITOT 0.6  PROT 6.6  ALBUMIN 3.1*    Recent Labs Lab 05/18/15 2140  LIPASE 42   No results for input(s): AMMONIA in the last 168 hours. CBC:  Recent Labs Lab 05/18/15 2140 05/20/15 0505 05/21/15 0523 05/22/15 0518  WBC 14.8* 13.7* 14.8* 13.2*  HGB 12.2* 11.1* 11.5* 12.1*  HCT 39.0 35.7* 36.8* 38.3*  MCV 92.0 91.8 90.6 91.0  PLT 331 334 328 288   Cardiac Enzymes: No results for input(s): CKTOTAL, CKMB, CKMBINDEX, TROPONINI in the last 168 hours. BNP: Invalid input(s): POCBNP CBG:  Recent Labs Lab 05/22/15 0818  GLUCAP 128*    No results found for this or any previous visit (from the past 240 hour(s)).   Scheduled Meds: . methylPREDNISolone (SOLU-MEDROL) injection  20 mg Intravenous Daily  . pantoprazole (PROTONIX) IV  40 mg Intravenous Q12H  Continuous Infusions: . sodium chloride 50 mL/hr at 05/23/15 1118

## 2015-05-25 ENCOUNTER — Ambulatory Visit: Admission: RE | Admit: 2015-05-25 | Payer: Medicare Other | Source: Ambulatory Visit | Admitting: Radiation Oncology

## 2015-05-25 ENCOUNTER — Inpatient Hospital Stay (HOSPITAL_COMMUNITY): Payer: Medicare Other | Admitting: Certified Registered Nurse Anesthetist

## 2015-05-25 ENCOUNTER — Ambulatory Visit
Admit: 2015-05-25 | Discharge: 2015-05-25 | Disposition: A | Discharge status: Home / Self Care | Payer: Medicare Other | Attending: Radiation Oncology | Admitting: Radiation Oncology

## 2015-05-25 ENCOUNTER — Inpatient Hospital Stay (HOSPITAL_COMMUNITY): Payer: Medicare Other

## 2015-05-25 ENCOUNTER — Encounter (HOSPITAL_COMMUNITY)
Admission: EM | Disposition: A | Discharge status: Hospice | Payer: Medicare Other | Source: Home / Self Care | Attending: Internal Medicine

## 2015-05-25 ENCOUNTER — Encounter (HOSPITAL_COMMUNITY): Payer: Self-pay | Admitting: Gastroenterology

## 2015-05-25 HISTORY — PX: DUODENAL STENT PLACEMENT: SHX5541

## 2015-05-25 HISTORY — PX: ESOPHAGOGASTRODUODENOSCOPY (EGD) WITH PROPOFOL: SHX5813

## 2015-05-25 SURGERY — ESOPHAGOGASTRODUODENOSCOPY (EGD) WITH PROPOFOL
Anesthesia: General

## 2015-05-25 MED ORDER — FENTANYL CITRATE (PF) 100 MCG/2ML IJ SOLN
INTRAMUSCULAR | Status: DC | PRN
  Administered 2015-05-25 (×2): 50 ug via INTRAVENOUS

## 2015-05-25 MED ORDER — ONDANSETRON HCL 4 MG/2ML IJ SOLN
INTRAMUSCULAR | Status: DC | PRN
  Administered 2015-05-25: 4 mg via INTRAVENOUS

## 2015-05-25 MED ORDER — PROPOFOL 10 MG/ML IV BOLUS
INTRAVENOUS | Status: DC | PRN
  Administered 2015-05-25: 150 mg via INTRAVENOUS
  Administered 2015-05-25: 20 mg via INTRAVENOUS

## 2015-05-25 MED ORDER — PROPOFOL 10 MG/ML IV BOLUS
INTRAVENOUS | Status: AC
  Filled 2015-05-25: qty 40

## 2015-05-25 MED ORDER — ONDANSETRON HCL 4 MG/2ML IJ SOLN
INTRAMUSCULAR | Status: AC
  Filled 2015-05-25: qty 2

## 2015-05-25 MED ORDER — DEXAMETHASONE SODIUM PHOSPHATE 10 MG/ML IJ SOLN
INTRAMUSCULAR | Status: DC | PRN
  Administered 2015-05-25: 10 mg via INTRAVENOUS

## 2015-05-25 MED ORDER — FENTANYL CITRATE (PF) 100 MCG/2ML IJ SOLN
INTRAMUSCULAR | Status: AC
  Filled 2015-05-25: qty 2

## 2015-05-25 MED ORDER — SUCCINYLCHOLINE CHLORIDE 20 MG/ML IJ SOLN
INTRAMUSCULAR | Status: DC | PRN
  Administered 2015-05-25: 100 mg via INTRAVENOUS

## 2015-05-25 MED ORDER — LIDOCAINE HCL (CARDIAC) 20 MG/ML IV SOLN
INTRAVENOUS | Status: AC
  Filled 2015-05-25: qty 5

## 2015-05-25 MED ORDER — SODIUM CHLORIDE 0.9 % IV SOLN
INTRAVENOUS | Status: DC
  Administered 2015-05-25: 16:00:00 via INTRAVENOUS

## 2015-05-25 MED ORDER — LIDOCAINE HCL (CARDIAC) 20 MG/ML IV SOLN
INTRAVENOUS | Status: DC | PRN
  Administered 2015-05-25: 100 mg via INTRAVENOUS

## 2015-05-25 MED ORDER — DEXAMETHASONE SODIUM PHOSPHATE 10 MG/ML IJ SOLN
INTRAMUSCULAR | Status: AC
  Filled 2015-05-25: qty 1

## 2015-05-25 MED ORDER — EPHEDRINE SULFATE 50 MG/ML IJ SOLN
INTRAMUSCULAR | Status: DC | PRN
  Administered 2015-05-25 (×2): 10 mg via INTRAVENOUS

## 2015-05-25 SURGICAL SUPPLY — 14 items

## 2015-05-25 NOTE — Progress Notes (Signed)
PT Cancellation Note  Patient Details Name: Craig Waters. MRN: [id number] DOB: [birth year]   Cancelled Treatment:    Reason Eval/Treat Not Completed: Other (comment);Pain limiting ability to participate; patient reports going down later for stent placement.  Reports also finally got comfortable due to nephrostomy tubes causing pain and afraid to move as may increase pain.  Will attempt tomorrow for PT eval.   Reginia Naas 05/25/2015, 12:29 PM  Magda Kiel, [place] 05/25/2015

## 2015-05-25 NOTE — Anesthesia Postprocedure Evaluation (Signed)
Anesthesia Post Note  Patient: Craig Waters.  Procedure(s) Performed: Procedure(s) (LRB): ESOPHAGOGASTRODUODENOSCOPY (EGD) WITH PROPOFOL (N/A) DUODENAL STENT PLACEMENT (N/A)  Patient location during evaluation: PACU Anesthesia Type: General Level of consciousness: awake and alert Pain management: pain level controlled Vital Signs Assessment: post-procedure vital signs reviewed and stable Respiratory status: spontaneous breathing, nonlabored ventilation, respiratory function stable and patient connected to nasal cannula oxygen Cardiovascular status: blood pressure returned to baseline and stable Postop Assessment: no signs of nausea or vomiting Anesthetic complications: no    Last Vitals:  Filed Vitals:   05/25/15 1339 05/25/15 1506  BP: 185/83 172/84  Pulse: 82 93  Temp:  36.6 C  Resp: 13 19    Last Pain:  Filed Vitals:   05/25/15 1509  PainSc: 4                  Tammala Weider JENNETTE

## 2015-05-25 NOTE — Progress Notes (Signed)
Daily Progress Note   Patient Name: Craig Waters.       Date: 05/25/2015 DOB: 15-Oct-1930  Age: 82 y.o. MRN#: [id number] Attending Physician: Robbie Lis, MD Primary Care Physician: [name], MD Admit Date: 05/18/2015  Reason for Consultation/Follow-up: Establishing goals of care and Psychosocial/spiritual support, symptom recommendations   Subjective:  -continued conversation regarding [place] and disposition options.  Discussed importance of converting to oral pain medications  -discussed hospice benefit and patient is hopeful to receive services, will write for choice.   Length of Stay: 7 days  Current Medications: Scheduled Meds:  . feeding supplement  1 Container Oral TID BM  . methylPREDNISolone (SOLU-MEDROL) injection  20 mg Intravenous Daily  . metoprolol  5 mg Intravenous Q12H  . pantoprazole (PROTONIX) IV  40 mg Intravenous Q12H    Continuous Infusions: . sodium chloride 50 mL/hr at 05/24/15 2343    PRN Meds: fentaNYL (SUBLIMAZE) injection, hydrALAZINE, HYDROmorphone (DILAUDID) injection, ondansetron (ZOFRAN) IV, ondansetron (ZOFRAN) IV  Physical Exam: Physical Exam  Constitutional: He is oriented to person, place, and time. He appears lethargic. He appears ill.  HENT:  Mouth/Throat: Oropharynx is clear and moist. Mucous membranes are dry.  Cardiovascular: Normal rate, regular rhythm and normal heart sounds.   Pulmonary/Chest: He has decreased breath sounds in the right lower field and the left lower field.  Abdominal: Soft. Bowel sounds are normal. There is no tenderness.  Musculoskeletal:       Right shoulder: He exhibits decreased strength.  Neurological: He is oriented to person, place, and time. He appears lethargic. He displays atrophy.  Skin:  Skin is warm and dry.                Vital Signs: BP 165/82 mmHg  Pulse 95  Temp(Src) 98.2 F (36.8 C) (Oral)  Resp 20  Ht 5\' 11"  (1.803 m)  Wt 91.8 kg (202 lb 6.1 oz)  BMI 28.24 kg/m2  SpO2 100% SpO2: SpO2: 100 % O2 Device: O2 Device: Not Delivered O2 Flow Rate:    Intake/output summary:  Intake/Output Summary (Last 24 hours) at 05/25/15 0814 Last data filed at 05/25/15 0735  Gross per 24 hour  Intake    955 ml  Output   2660 ml  Net  -1705 ml   LBM: Last BM Date: 05/17/15  Baseline Weight: Weight: 92.987 kg (205 lb) Most recent weight: Weight: 91.8 kg (202 lb 6.1 oz)       Palliative Assessment/Data: Flowsheet Rows        Most Recent Value   Intake Tab    Referral Department  Hospitalist   Unit at Time of Referral  Cardiac/Telemetry Unit   Palliative Care Primary Diagnosis  Cancer   Date Notified  05/19/15   Palliative Care Type  New Palliative care   Reason for referral  Clarify Goals of Care   Date of Admission  05/18/15   Date first seen by Palliative Care  05/20/15   # of days Palliative referral response time  1 Day(s)   # of days IP prior to Palliative referral  1   Clinical Assessment    Psychosocial & Spiritual Assessment    Palliative Care Outcomes       Additional Data Reviewed: CBC    Component Value Date/Time   WBC 13.2* 05/22/2015 0518   RBC 4.21* 05/22/2015 0518   HGB 12.1* 05/22/2015 0518   HCT 38.3* 05/22/2015 0518   PLT 288 05/22/2015 0518   MCV 91.0 05/22/2015 0518   MCH 28.7 05/22/2015 0518   MCHC 31.6 05/22/2015 0518   RDW 14.8 05/22/2015 0518   LYMPHSABS 1.3 03/14/2015 1220   MONOABS 0.9 03/14/2015 1220   EOSABS 0.1 03/14/2015 1220   BASOSABS 0.0 03/14/2015 1220    CMP     Component Value Date/Time   NA 137 05/22/2015 0518   K 4.2 05/22/2015 0518   CL 105 05/22/2015 0518   CO2 19* 05/22/2015 0518   GLUCOSE 43* 05/22/2015 0518   BUN 20 05/22/2015 0518   CREATININE 1.10 05/22/2015 0518   CALCIUM 8.4* 05/22/2015 0518    PROT 6.6 05/18/2015 2140   ALBUMIN 3.1* 05/18/2015 2140   AST 21 05/18/2015 2140   ALT 19 05/18/2015 2140   ALKPHOS 98 05/18/2015 2140   BILITOT 0.6 05/18/2015 2140   GFRNONAA >60 05/22/2015 0518   GFRAA >60 05/22/2015 0518       Problem List:  Patient Active Problem List   Diagnosis Date Noted  . DNR (do not resuscitate)   . Palliative care encounter   . Nausea with vomiting   . Hypertensive urgency 05/21/2015  . CKD (chronic kidney disease) stage 3, GFR 30-59 ml/min 05/21/2015  . History of DVT (deep vein thrombosis) 05/21/2015  . Leukocytosis 05/21/2015  . Duodenal stricture   . Abnormal CT scan, small bowel   . Malnutrition of moderate degree 05/19/2015  . Gastric outlet obstruction 05/18/2015  . Atrial fibrillation with RVR ([place]) 03/14/2015  . Urothelial carcinoma ([place]) 02/15/2015  . Rheumatoid arthritis ([place]) 02/15/2015     Palliative Care Assessment & Plan    1.Code Status:  DNR    Code Status Orders        Start     Ordered   05/19/15 0002  Do not attempt resuscitation (DNR)   Continuous    Question Answer Comment  In the event of cardiac or respiratory ARREST Do not call a "code blue"   In the event of cardiac or respiratory ARREST Do not perform Intubation, CPR, defibrillation or ACLS   In the event of cardiac or respiratory ARREST Use medication by any route, position, wound care, and other measures to relive pain and suffering. May use oxygen, suction and manual treatment of airway obstruction as needed for comfort.      05/19/15 0002  Code Status History    Date Active Date Inactive Code Status Order ID Comments User Context   05/07/2015  2:31 AM 05/11/2015  3:15 PM Full Code [id number]  Ivor Costa, MD ED   04/05/2015  3:59 AM 04/09/2015  2:58 PM Full Code [id number]  Oswald Hillock, MD Inpatient   03/14/2015  4:26 PM 03/20/2015  6:52 PM Full Code [id number]  Elmarie Shiley, MD Inpatient   02/15/2015  8:15 PM 02/22/2015 10:17 PM Full Code [id number]   Venetia Maxon Rama, MD Inpatient   05/15/2014 12:30 AM 05/15/2014  8:16 PM Full Code [id number]  Allyne Gee, MD Inpatient   07/04/2012  8:45 PM 07/08/2012  4:33 PM Full Code [id number]  Derrill Kay, MD ED   11/08/2011 11:45 PM 11/13/2011  5:35 AM Full Code [id number]  Lenis Noon, RN Inpatient    Advance Directive Documentation        Most Recent Value   Type of Advance Directive  Healthcare Power of Attorney   Pre-existing out of facility DNR order (yellow form or pink MOST form)     "MOST" Form in Place?         Desire for further Chaplaincy support:no  Psycho-social Needs: Education on Hospice  3. Symptom Management:      1. Covert IV medication to oral agents in anticipation for discharge home                Oxycodone 5 mg po every 3 hrs prn  4. Palliative Prophylaxis:   Aspiration, Bowel Regimen, Frequent Pain Assessment and Oral Care   6. Discharge Planning:  Pending  Discussed with Dr Charlies Silvers   Thank you for allowing the Palliative Medicine Team to assist in the care of this patient.   Time In:  0800 Time Out: 0825 Total Time 25 min Prolonged Time Billed  no         Knox Royalty, NP  05/25/2015, 8:14 AM  Please contact Palliative Medicine Team phone at [phone] for questions and concerns.

## 2015-05-25 NOTE — Transfer of Care (Signed)
Immediate Anesthesia Transfer of Care Note  Patient: Craig Waters.  Procedure(s) Performed: Procedure(s): ESOPHAGOGASTRODUODENOSCOPY (EGD) WITH PROPOFOL (N/A) DUODENAL STENT PLACEMENT (N/A)  Patient Location: PACU  Anesthesia Type:General  Level of Consciousness:  sedated, patient cooperative and responds to stimulation  Airway & Oxygen Therapy:Patient Spontanous Breathing and Patient connected to face mask oxgen  Post-op Assessment:  Report given to PACU RN and Post -op Vital signs reviewed and stable  Post vital signs:  Reviewed and stable  Last Vitals:  Filed Vitals:   05/25/15 1339 05/25/15 1506  BP: 185/83 172/84  Pulse: 82 93  Temp:  36.6 C  Resp: 13 19    Complications: No apparent anesthesia complications

## 2015-05-25 NOTE — Interval H&P Note (Signed)
History and Physical Interval Note:  05/25/2015 2:13 PM  Craig Waters.  has presented today for surgery, with the diagnosis of Duodenal obstruction; stent placement  The various methods of treatment have been discussed with the patient and family. After consideration of risks, benefits and other options for treatment, the patient has consented to  Procedure(s): ESOPHAGOGASTRODUODENOSCOPY (EGD) WITH PROPOFOL (N/A) DUODENAL STENT PLACEMENT (N/A) as a surgical intervention .  The patient's history has been reviewed, patient examined, no change in status, stable for surgery.  I have reviewed the patient's chart and labs.  Questions were answered to the patient's satisfaction.     Milus Banister

## 2015-05-25 NOTE — Care Management Important Message (Signed)
Important Message  Patient Details  Name: Craig Waters. MRN: [id number] Date of Birth: 04/22/1931   Medicare Important Message Given:  Yes    Camillo Flaming 05/25/2015, 9:23 [place] Message  Patient Details  Name: Craig Waters. MRN: [id number] Date of Birth: 1930/08/26   Medicare Important Message Given:  Yes    Camillo Flaming 05/25/2015, 9:23 AM

## 2015-05-25 NOTE — Op Note (Signed)
[facility] [place], [address]   ENDOSCOPY PROCEDURE REPORT  PATIENT: Craig Waters, Craig Waters  MR#: [id number] BIRTHDATE: 07/06/1930 , 84  yrs. old GENDER: male ENDOSCOPIST: Milus Banister, MD PROCEDURE DATE:  05/25/2015 PROCEDURE:  EGD w/ transendoscopic stent ASA CLASS:     Class III INDICATIONS:  metastatic urologic cancer (bladder) causing ureteral obstruction (has bilateral nephrostomy tubes in place) and now gastric outlet obstruction in second/third duodenum (verified by EGD Dr.  Fuller Plan last week and UGI barium study recently); he is dependent on NG tube currently. MEDICATIONS: Per Anesthesia TOPICAL ANESTHETIC: none  DESCRIPTION OF PROCEDURE: After the risks benefits and alternatives of the procedure were thoroughly explained, informed consent was obtained.  The Pentax Gastroscope [id number] endoscope was introduced through the mouth and advanced to the second portion of the duodenum , Without limitations.  The instrument was slowly withdrawn as the mucosa was fully examined.   While laying supine, under general anesthesia I introduced standard adult gastroscope to inspect the stricture.  I was able to advance the scope through the stricture completely with moderate resistence only.  This appeared to be 8cm long with distal end deep into D3. Fluoroscopy was used throughout the case and the center of the stricture was felt to be adjacent to his IVC filter.  A larger diameter gastroscope was then introduced and a 12cm long uncovered, metal mesh SEMS was placed in good position across the sricture. The proximal end of the stent extended about 1cm into the duodenal bulb.  The center point of the "waist" of the stent was adjacent to the IVC filter on fluoroscopy.  This can be used as a reference point (in supine position) to gauge stent position if needed in the future.  Retroflexion was not performed.     The scope was then withdrawn from the patient  and the procedure completed.  COMPLICATIONS: There were no immediate complications.  ENDOSCOPIC IMPRESSION: While laying supine, under general anesthesia I introduced standard adult gastroscope to inspect the stricture.  This appeared to be 8cm long with distal end deep into D3.  Fluoroscopy was used throughout the case and the center of the stricture was felt to be adjacent to his IVC filter.  A larger diameter gastroscope was then introduced and a 12cm long uncovered, metal mesh SEMS was placed in good position across the sricture.  The proximal end of the stent extended about 1cm into the duodenal bulb.  The center point of the "waist" of the stent was adjacent to the IVC filter on fluoroscopy. This can be used as a reference point (in supine position) to gauge stent position if needed in the future  RECOMMENDATIONS: Duodenal obstruction at level of D2, D3.  This was treated with placement of 12cm long uncovered metal mesh stent. Clear liquids today, will advance to full liquids tomorrow pending clinical course.  eSigned:  Milus Banister, MD 05/25/2015 3:31 PM    CC: Lucio Edward.

## 2015-05-25 NOTE — Anesthesia Preprocedure Evaluation (Signed)
Anesthesia Evaluation  Patient identified by MRN, date of birth, ID band Patient awake    Reviewed: Allergy & Precautions, NPO status , Patient's Chart, lab work & pertinent test results  History of Anesthesia Complications Negative for: history of anesthetic complications  Airway Mallampati: III  TM Distance: >3 FB Neck ROM: Full    Dental no notable dental hx. (+) Dental Advisory Given, Poor Dentition   Pulmonary former smoker,    Pulmonary exam normal breath sounds clear to auscultation       Cardiovascular hypertension, Normal cardiovascular exam Rhythm:Regular Rate:Normal     Neuro/Psych negative neurological ROS  negative psych ROS   GI/Hepatic Neg liver ROS, GERD  Medicated,Gastric outlet obstruction, NG tube in place   Endo/Other  negative endocrine ROS  Renal/GU   negative genitourinary   Musculoskeletal  (+) Arthritis ,   Abdominal   Peds negative pediatric ROS (+)  Hematology negative hematology ROS (+)   Anesthesia Other Findings   Reproductive/Obstetrics negative OB ROS                             Anesthesia Physical Anesthesia Plan  ASA: III  Anesthesia Plan: General   Post-op Pain Management:    Induction: Intravenous, Rapid sequence and Cricoid pressure planned  Airway Management Planned: Oral ETT  Additional Equipment:   Intra-op Plan:   Post-operative Plan: Extubation in OR  Informed Consent: I have reviewed the patients History and Physical, chart, labs and discussed the procedure including the risks, benefits and alternatives for the proposed anesthesia with the patient or authorized representative who has indicated his/her understanding and acceptance.   Dental advisory given  Plan Discussed with: CRNA  Anesthesia Plan Comments:         Anesthesia Quick Evaluation

## 2015-05-25 NOTE — Progress Notes (Signed)
Patient's family asking for update on patient's condition and plan of care, notified Dr. Charlies Silvers and she called and talk to them.

## 2015-05-25 NOTE — Progress Notes (Signed)
Per Effey, RN on [place] patient is refusing radiation treatment today. Effey, RN reports the patient is scheduled for an EGD "at some point today." Will call again tomorrow to determine if patient is ready to start radiation therapy. Informed Faith, RT on L2 of this finding.

## 2015-05-25 NOTE — Progress Notes (Signed)
Patient ID: Craig Yokel., male   DOB: April 22, 1931, 82 y.o.   MRN: [id number] [facility] PROGRESS NOTE  Craig Guiles. [id number] DOB: 08/07/1930 DOA: 05/18/2015 PCP: [name], MD  Brief narrative:    83 y.o. male. Hx BPH, s/p radical prostatectomy. Hx A fib. DVT, s/p IVC filter, unable to take [facility] due to hematuria, stage 4, invasive urothelial bladder cancer, TURBT resection 02/28/2015 (high grade, poor prognosis per onc at [place]), s/p nephrostomy and ureteral tubes, recent admission 1/12 - 05/11/15 with UTI. bil percutaneous nephrostomy tubes 05/08/15, Rad tx by Dr Tammi Klippel. Pt was found to have gastric outlet obstruction on this admission. He has NG tube and per GI possible stent placement today.  Assessment/Plan:    Principal Problem:  Gastric outlet obstruction / Nausea and vomiting - Likely secondary to metastatic bladder cancer - EGD 1/26 showed Class D esophagitis, retained solid and liquid gastric contents and long segment of stenosis/stricture in D2-D3 from extrinsic compression - Upper GI series demonstrated about 8 cm long segment of narrowing of the second and third portion of the duodenum with the most severe area in the third portion over 3.5 cm segment. - Continue NG tube for decompression - Plan for stent placement today - Continue PPI therapy  Active Problems:  Urothelial carcinoma ([address]), stage IV - Follows with Dr Lindi Adie - Per oncology, no role for chemo at this time, palliative and hospice recommended  - Appreciate palliative care team following    Ureteral obstruction - Status post bilateral nephrostomy tubes placement 05/08/2015 by urology - Has some pain at the nephrostomy tube site which is likely related to malignancy considering recent CT scan showed that these tubes are in good position - Continue pain management efforts   Leukocytosis - Likely related to steroids - No evidence of acute infectious process  - Check CBC  tomorrow morning   Severe protein calorie malnutrition - In the context of chronic illness - Patient is nothing by mouth because of gastric outlet obstruction but the GI allowed clear liquids so patient can have soda   Hypertensive urgency / essential hypertension - Blood pressure and heart rate controlled with metoprolol 5 mg IV every 12 hours   History of recent DVT - Patient is status post IVC filter placement - Patient is not anticoagulated secondary to risk of bleed. Had hematuria on the admission.   Atrial Fibrillation - CHA2DS2-VASc Score 3 - Not on AC due to risk of bleed - Started low dose metoprolol IV 5 mg Q 12 hours for rate and BP control     Anemia of chronic disease - Secondary to combination of chronic kidney disease and malignancy - Hemoglobin stable - Follow-up CBC tomorrow morning   Chronic kidney disease stage 3  - Baseline Cr is 1.66 - Creatinine WNL on this admission     Rheumatoid arthritis - On Prednisone 5 mg PO at home which is converted to Solumedrol while NPO  DVT Prophylaxis  - Use SCDs for DVT prophylaxis  Code Status: DNR/DNI Family Communication:  Family not at the bedside this am  Disposition Plan: Plan for stent placement today. Hopefully we will be able to get NG tube out and anticipated discharge in next 2 days, probably by 05/27/2015  IV access:  Peripheral IV  Procedures and diagnostic studies:     Ct Abdomen Pelvis W Contrast 05/18/2015  There appears to be gastric outlet obstruction with distended fluid-filled stomach to the level of the second portion the  duodenum. There is retroperitoneal infiltration or scarring which may be causing gastric outlet obstruction. Duodenal mass or inflammation not excluded. Diffuse retroperitoneal inflammation or scarring most prominent on the right extending down to the pelvis. Bilateral percutaneous nephrostomy tubes and left ureteral tube. Diffuse asymmetric bladder wall thickening, likely  neoplastic. Loss of distinction between the anterior bladder wall and anterior abdominal wall suggests direct infiltration versus scarring. Metastatic lesion in the right lobe of the liver appears unchanged since prior study. Electronically Signed   By: Lucienne Capers M.D.   On: 05/18/2015 23:31   Dg Duanne Limerick W/high Density W/kub 05/21/2015   7.5-8 cm long segment of narrowing of the second and third portions of the duodenum. The most severe area of narrowing is in the third portion of the duodenum over a 3.5-4 cm segment.   Medical Consultants:  Gastroenterology PCT Surgery over the phone Oncology Dr. Lindi Adie   Other Consultants:  None  IAnti-Infectives:   None    Leisa Lenz, MD  [facility] Pager [phone]  Time spent in minutes: 25 minutes  If 7PM-7AM, please contact night-coverage [email or web address] Password [email or web address] 05/25/2015, 9:10 AM   LOS: 7 days    HPI/Subjective: No acute overnight events. Patient feels tired.  Objective: Filed Vitals:   05/24/15 2030 05/24/15 2230 05/24/15 2253 05/25/15 0510  BP: 175/81 151/61  165/82  Pulse: 88 0  95  Temp: 98 F (36.7 C)  97.9 F (36.6 C) 98.2 F (36.8 C)  TempSrc: Oral  Oral Oral  Resp: 20   20  Height:      Weight:      SpO2: 95%   100%    Intake/Output Summary (Last 24 hours) at 05/25/15 0910 Last data filed at 05/25/15 0735  Gross per 24 hour  Intake    955 ml  Output   2660 ml  Net  -1705 ml    Exam:   General:  Pt is alert, awake  Cardiovascular: Rate controlled, appreciate S1-S2  Respiratory: No wheezing, no rhonchi  Abdomen: NG tube in place, appreciate bowel sounds  Extremities: No edema, bilateral pulses  Neuro: Nonfocal  Data Reviewed: Basic Metabolic Panel:  Recent Labs Lab 05/18/15 2140 05/20/15 0505 05/21/15 0523 05/22/15 0518  NA 137 139 136 137  K 4.1 4.2 4.0 4.2  CL 101 108 105 105  CO2 26 22 21* 19*  GLUCOSE 122* 90 72 43*  BUN 16 17 19 20   CREATININE 1.15 0.98 0.93 1.10   CALCIUM 8.6* 8.3* 8.1* 8.4*   Liver Function Tests:  Recent Labs Lab 05/18/15 2140  AST 21  ALT 19  ALKPHOS 98  BILITOT 0.6  PROT 6.6  ALBUMIN 3.1*    Recent Labs Lab 05/18/15 2140  LIPASE 42   No results for input(s): AMMONIA in the last 168 hours. CBC:  Recent Labs Lab 05/18/15 2140 05/20/15 0505 05/21/15 0523 05/22/15 0518  WBC 14.8* 13.7* 14.8* 13.2*  HGB 12.2* 11.1* 11.5* 12.1*  HCT 39.0 35.7* 36.8* 38.3*  MCV 92.0 91.8 90.6 91.0  PLT 331 334 328 288   Cardiac Enzymes: No results for input(s): CKTOTAL, CKMB, CKMBINDEX, TROPONINI in the last 168 hours. BNP: Invalid input(s): POCBNP CBG:  Recent Labs Lab 05/22/15 0818  GLUCAP 128*    No results found for this or any previous visit (from the past 240 hour(s)).   Scheduled Meds: . feeding supplement  1 Container Oral TID BM  . methylPREDNISolone (SOLU-MEDROL) injection  20 mg Intravenous Daily  . metoprolol  5 mg Intravenous Q12H  . pantoprazole (PROTONIX) IV  40 mg Intravenous Q12H   Continuous Infusions: . sodium chloride 50 mL/hr at 05/24/15 2343

## 2015-05-25 NOTE — H&P (View-Only) (Signed)
[facility] Progress Note    Since last GI note: He is uncomfortable, laying in bed with NG tube draining.  Bilateral nephrostomy tubes.  Objective: Vital signs in last 24 hours: Temp:  [98 F (36.7 C)-98.4 F (36.9 C)] 98.4 F (36.9 C) (01/30 0507) Pulse Rate:  [89-101] 101 (01/30 0507) Resp:  [18-20] 18 (01/30 0507) BP: (159-172)/(77-81) 172/78 mmHg (01/30 0507) SpO2:  [96 %-98 %] 98 % (01/30 0507) Last BM Date: 05/17/15 General: alert and oriented times 3 Heart: regular rate and rythm Abdomen: soft, non-tender, non-distended, normal bowel sounds   Lab Results:  Recent Labs  05/22/15 0518  WBC 13.2*  HGB 12.1*  PLT 288  MCV 91.0    Recent Labs  05/22/15 0518  NA 137  K 4.2  CL 105  CO2 19*  GLUCOSE 43*  BUN 20  CREATININE 1.10  CALCIUM 8.4*   No results for input(s): PROT, ALBUMIN, AST, ALT, ALKPHOS, BILITOT, BILIDIR, IBILI in the last 72 hours. No results for input(s): INR in the last 72 hours.   Studies/Results: No results found.   Medications: Scheduled Meds: . enoxaparin (LOVENOX) injection  40 mg Subcutaneous Q24H  . methylPREDNISolone (SOLU-MEDROL) injection  20 mg Intravenous Daily  . pantoprazole (PROTONIX) IV  40 mg Intravenous Q12H   Continuous Infusions: . sodium chloride 50 mL/hr at 05/23/15 1118   PRN Meds:.fentaNYL (SUBLIMAZE) injection, hydrALAZINE, HYDROmorphone (DILAUDID) injection, ondansetron (ZOFRAN) IV, ondansetron (ZOFRAN) IV    Assessment/Plan: 82 y.o. male with metastatic bladder cancer that is causing renal obstruction and gastric outlet obstruction  He understands risks of duodenal stenting (including perforation, bleeding, migration) and wants to proceed.  I will start to coordinate with anesthesia, endoscopy and stent company.  Likely this procedure will be Tues or Wednesday. Will have to hold lovenox appropriately as well.  He really want a soda and this is safe. I will change his diet orders for  liquids only.  Should keep NG tube in place.  Primary team to address the discomfort at his nephrotomy tube sites, may need IR or urology input on these devises.    Milus Banister, MD  05/24/2015, 8:58 AM [facility] Pager [phone]

## 2015-05-25 NOTE — Anesthesia Procedure Notes (Signed)
Procedure Name: Intubation Date/Time: 05/25/2015 2:19 PM Performed by: Maxwell Caul Pre-anesthesia Checklist: Patient identified, Emergency Drugs available, Suction available and Patient being monitored Patient Re-evaluated:Patient Re-evaluated prior to inductionOxygen Delivery Method: Circle System Utilized Preoxygenation: Pre-oxygenation with 100% oxygen Intubation Type: IV induction, Rapid sequence and Cricoid Pressure applied Laryngoscope Size: Mac and 4 Grade View: Grade I Tube type: Oral Tube size: 7.5 mm Number of attempts: 1 Airway Equipment and Method: Stylet and Oral airway Placement Confirmation: ETT inserted through vocal cords under direct vision,  positive ETCO2 and breath sounds checked- equal and bilateral Secured at: 21 cm Tube secured with: Tape Dental Injury: Teeth and Oropharynx as per pre-operative assessment

## 2015-05-25 NOTE — Progress Notes (Signed)
Understand from [place], [place] that the patient is refusing to start radiation treatment today. Will call again tomorrow to inquire. Informed Jehnna, RT of this finding.

## 2015-05-25 NOTE — Progress Notes (Signed)
Patient refusing SCD's.   Educated pt on VTE prophylaxis, he continues to refuse.  Will inform MD via text page.

## 2015-05-25 NOTE — Plan of Care (Signed)
Problem: Safety: Goal: Ability to remain free from injury will improve Continue.    Problem: Pain Managment: Goal: General experience of comfort will improve Outcome: Progressing D- complains of pain 3-4/10 to bilateral flanks.   A- IV Dilaudid given (2mg) R- Effective pain relief.    Problem: Physical Regulation: Goal: Will remain free from infection Labs WNL.  BP 126/45 mmHg  Pulse 75  Temp(Src) 98.2 F (36.8 C) (Oral)  Resp 20  Ht 5' 11" (1.803 m)  Wt 91.8 kg (202 lb 6.1 oz)  BMI 28.24 kg/m2  SpO2 100%RA.       Problem: Tissue Perfusion: Goal: Risk factors for ineffective tissue perfusion will decrease Continue.    Problem: Fluid Volume: Goal: Ability to maintain a balanced intake and output will improve Continue.    Problem: Nutrition: Goal: Adequate nutrition will be maintained Outcome: Not Met (add Reason) NPO at this time for EGD with stent placement today.       

## 2015-05-26 ENCOUNTER — Encounter (HOSPITAL_COMMUNITY): Payer: Self-pay | Admitting: Gastroenterology

## 2015-05-26 ENCOUNTER — Encounter: Payer: Self-pay | Admitting: Radiation Oncology

## 2015-05-26 ENCOUNTER — Ambulatory Visit
Admission: RE | Admit: 2015-05-26 | Discharge: 2015-05-26 | Disposition: A | Discharge status: Home / Self Care | Payer: Medicare Other | Source: Ambulatory Visit | Attending: Radiation Oncology | Admitting: Radiation Oncology

## 2015-05-26 ENCOUNTER — Telehealth: Payer: Self-pay | Admitting: Oncology

## 2015-05-26 DIAGNOSIS — G893 Neoplasm related pain (acute) (chronic): Secondary | ICD-10-CM

## 2015-05-26 DIAGNOSIS — C689 Malignant neoplasm of urinary organ, unspecified: Secondary | ICD-10-CM

## 2015-05-26 MED ORDER — HYDROMORPHONE HCL 1 MG/ML IJ SOLN
0.5000 mg | INTRAMUSCULAR | Status: DC | PRN
  Administered 2015-05-26 – 2015-05-27 (×4): 0.5 mg via INTRAVENOUS
  Filled 2015-05-26 (×4): qty 1

## 2015-05-26 MED ORDER — OXYCODONE HCL 5 MG PO TABS
5.0000 mg | ORAL_TABLET | ORAL | Status: DC | PRN
  Administered 2015-05-26 – 2015-05-27 (×3): 5 mg via ORAL
  Filled 2015-05-26 (×3): qty 1

## 2015-05-26 MED ORDER — OXYCODONE HCL 5 MG PO TABS: 5.0000 mg | ORAL_TABLET | ORAL | Status: DC | PRN

## 2015-05-26 NOTE — Clinical Documentation Improvement (Addendum)
Hospitalist  (Query responses must be documented in the current medical record, not on the CDI BPA from.)  To assist with accurate code assignment, please document the location/anatomical site of the metastatic bladder cancer causing the gastric outlet obstruction requiring treatment with the placement of a stent at level D2 and D3.  Possible Clinical Conditions:  - Gut Metastasis  (please specify location - peritoneum, specified abdominal organ, retroperitoneal, etc)   - Other location or anatomical site  - Unable to clinically determine  Clinical Information/Indicators: "Long acquired extrinsic stenosis in the 3rd part of the duodenum and 2nd part duodenum" documented by Dr. Fuller Plan 05/20/15 at 2:29 EGD procedure note   "There appears to be gastric outlet obstruction with distended fluid-filled stomach to the level of the second portion the duodenum. There is retroperitoneal infiltration or scarring which may be causing gastric outlet obstruction." documented in the dictated CT 06/08/15     Please exercise your independent, professional judgment when responding. A specific answer is not anticipated or expected.   Thank You, Erling Conte  RN BSN [facility] [phone] Health Information Management [place]   Addendum:  I have added more specific GOO, not sure how to further specify bladder metastases, please clarify  Leisa Lenz [facility] [phone]

## 2015-05-26 NOTE — Care Management Note (Signed)
Case Management Note  Patient Details  Name: Craig Waters. MRN: [id number] Date of Birth: 1931/01/07  Subjective/Objective:  Spoke to patient/dtr Victoriano Lain in rm about home hospice choice-[facility] chosen. TC [facility] office w/referral, await call back from liason.DME needed:hospital bed, mattress,overbed table.  dtr-[name] will be the primary contact c#[phone], family will have their own transp home. Patient agree to use [facility] pcp if accepted by their service.  Await call back for eval, & acceptance.Plan for d/c in [place] notified of current d/c plan.                  Action/Plan:d/c plan home w/hospice.   Expected Discharge Date:                  Expected Discharge Plan:  Home w Hospice Care  In-House Referral:     Discharge planning Services  CM Consult  Post Acute Care Choice:  Home Health (Active [facility]-HHRN/PT) Choice offered to:     DME Arranged:    DME Agency:     HH Arranged:    HH Agency:     Status of Service:  In process, will continue to follow  Medicare Important Message Given:  Yes Date Medicare IM Given:    Medicare IM give by:    Date Additional Medicare IM Given:    Additional Medicare Important Message give by:     If discussed at [place] of Stay Meetings, dates discussed:    Additional Comments:  Dessa Phi, RN 05/26/2015, 3:53 PM

## 2015-05-26 NOTE — Progress Notes (Signed)
Nutrition Follow-up  DOCUMENTATION CODES:   Non-severe (moderate) malnutrition in context of acute illness/injury  INTERVENTION:  - Continue Boost Breeze TID - Will monitor for need to adjust supplements - Will monitor for further diet education needs prior to d/c  NUTRITION DIAGNOSIS:   Increased nutrient needs related to catabolic illness, cancer and cancer related treatments as evidenced by estimated needs. -revised, ongoing  GOAL:   Patient will meet greater than or equal to 90% of their needs -unmet  MONITOR:   PO intake, Diet advancement, Supplement acceptance, Weight trends, Labs, Skin, I & O's  REASON FOR ASSESSMENT:   Consult  ASSESSMENT:   82 y.o. male with h/o stage 4 bladder cancer, mets to liver, abdomen. Patient presents to the ED with 3 day history of moderate abdominal pain, N/V, and dry heaving. Unable to tolerate anything PO. Essentially no BM at all during this period. This has been worsening. He has tried nothing for symptoms.  2/1 New consult received: has a duodenal stent. Needs to be on FLD/pureed diet. Per chart review, pt consumed 50% of CLD yesterday for dinner and was NPO for breakfast and lunch yesterday for EGD with transendoscopic stent placement. Pt reports that for breakfast this AM he had coffee, [place] ice, and jello. He denies any nausea and states abdominal soreness which is not exacerbated by PO intakes. Pt's diet advanced to FLD following breakfast. Pt states that surgeon informed him that he will be on Soft diet at d/c. Talked with pt about the need to have very soft items, such as items that require minimal to no chewing, and that are free of all lunch and chunks. Pt lists items such as chicken noodle soup, potato salad, and coleslaw. Talked with pt about these foods and why they are inappropriate at this time. Provided him with handouts outlining appropriate foods from each food group as well as inappropriate foods from each group.  Re-iterated several times the importance of very soft foods that are free of lumps and chunks but question if pt fully comprehends this as he talks about cream of mushroom soup with added onion and broccoli soup with chunks of broccoli.   Pt not currently meeting needs. Will keep Boost Breeze order in place at this time and monitor for need for adjustment if pt not d/c'ed today. Medications reviewed. Labs reviewed; Ca: 8.4 mg/dL.    1/30 - Pt has been NPO since previous assessment until advancement to CLD this AM at 0903.  - Pt states that he drank a Coke and "felt wonderful" after this; no other intakes since advancement.  - Pt states ongoing severe abdominal pain.  - Pt requested pain medication during visit and RD alerted RN to request.  - Pt has NGT in place with 300cc output this shift.  - Per chart and round this AM, pt to have EGD tomorrow (1/31) and possible stent placement.   1/25 - Pt has been NPO since admission.  - Several attempts to place NGT at bedside have been unsuccessful.  - Talked with pt as he was lying in bed and no one was at bedside.  - He states that he is miserable and is hoping to receive relief soon from abdominal discomfort.  - He states that he last ate yesterday (1/2 hamburger) and that this did not cause any increase in nausea or in feelings of abdominal pressure.  - Pt states that abdominal pressure has been ongoing for several days and has caused associated nausea.  -  He has attempted to vomit to relieve this but has been unsuccessful. - Saw RN note from this AM at [phone]. Will continue to monitor for GOC and plan during hospitalization. - Pt reports UBW of 205 lbs and that he last weighed this 3 weeks ago.  - Per chart review, pt has lost 3 lbs (1.5% body weight) in the past 1 week which is significant for time frame.  - Mild muscle and fat wasting noted to upper body; unable to assess lower body.   Diet Order:  Diet full liquid Room service  appropriate?: Yes; Fluid consistency:: Thin  Skin:  Reviewed, no issues  Last BM:  1/26  Height:   Ht Readings from Last 1 Encounters:  05/19/15 5\' 11"  (1.803 m)    Weight:   Wt Readings from Last 1 Encounters:  05/19/15 202 lb 6.1 oz (91.8 kg)    Ideal Body Weight:  78.18 kg (kg)  BMI:  Body mass index is 28.24 kg/(m^2).  Estimated Nutritional Needs:   Kcal:  2100-2300  Protein:  85-100 grams  Fluid:  2-2.3 L/day  EDUCATION NEEDS:   No education needs identified at this time     Jarome Matin, RD, LDN Inpatient Clinical Dietitian Pager # [phone] After hours/weekend pager # [phone]

## 2015-05-26 NOTE — Evaluation (Signed)
Physical Therapy Evaluation Patient Details Name: Craig Waters. MRN: [id number] DOB: 03/05/1931 Today's Date: 05/26/2015   History of Present Illness  Craig Waters. is a 82 y.o. male with h/o stage 4 bladder cancer, mets to liver, abdomen. Patient presents to the ED with 3 day history of moderate abdominal pain, N/V, and dry heaving. Pt s/p EGD w/ transendoscopic stent (05/25/15) PMH includeds, RA, DVT, IVC filter, vertigo, PAF, CKD, glaucoma  Clinical Impression  Pt admitted with above diagnosis. Pt currently with functional limitations due to the deficits listed below (see PT Problem List).  Pt will benefit from skilled PT to increase their independence and safety with mobility to allow discharge to the venue listed below.  Pt agreeable to OOB, but declined ambulation.  Pt was able to stand and transfer with min/guard with good safety awareness with nephrostomy drains.      Follow Up Recommendations Home health PT    Equipment Recommendations  None recommended by PT    Recommendations for Other Services       Precautions / Restrictions Precautions Precautions: Fall Precaution Comments: bil neph tubes Restrictions Weight Bearing Restrictions: No      Mobility  Bed Mobility Overal bed mobility: Modified Independent             General bed mobility comments: use of bed rail with HOB elevated  Transfers Overall transfer level: Needs assistance Equipment used: Rolling walker (2 wheeled) Transfers: Sit to/from [facility] Sit to Stand: Min guard Stand pivot transfers: Min guard       General transfer comment: min/guard for safety.  Ambulation/Gait             General Gait Details: Pt declined gait.  This was first time up in chair in days per pt.  Stairs            Wheelchair Mobility    Modified Rankin (Stroke Patients Only)       Balance Overall balance assessment: Needs assistance Sitting-balance support: Feet  supported;No upper extremity supported Sitting balance-Leahy Scale: Good       Standing balance-Leahy Scale: Fair                               Pertinent Vitals/Pain Pain Assessment: 0-10 Pain Score: 2  Pain Location: bladder Pain Descriptors / Indicators: Sore Pain Intervention(s): Limited activity within patient's tolerance;Monitored during session;Repositioned    Home Living Family/patient expects to be discharged to:: Private residence Living Arrangements: Children Available Help at Discharge: Family;Available 24 hours/day (daughter) Type of Home: House Home Access: Level entry     Home Layout: One level Home Equipment: Walker - 2 wheels;Cane - single point;Bedside commode;Shower seat      Prior Function Level of Independence: Independent         Comments: Has been receiving HHPT     Hand Dominance   Dominant Hand: Right    Extremity/Trunk Assessment   Upper Extremity Assessment: Generalized weakness           Lower Extremity Assessment: Generalized weakness      Cervical / Trunk Assessment: Normal  Communication   Communication: No difficulties  Cognition Arousal/Alertness: Awake/alert Behavior During Therapy: WFL for tasks assessed/performed Overall Cognitive Status: Within Functional Limits for tasks assessed                      General Comments General comments (skin integrity, edema, etc.): Pt  demonstates good knowledge in nephrostomy drains and pinning them to gown, etc.    Exercises        Assessment/Plan    PT Assessment Patient needs continued PT services  PT Diagnosis Difficulty walking;Generalized weakness   PT Problem List Decreased strength;Decreased activity tolerance;Decreased balance;Decreased mobility;Decreased knowledge of use of DME  PT Treatment Interventions DME instruction;Gait training;Functional mobility training;Therapeutic activities;Therapeutic exercise;Balance training;Patient/family  education   PT Goals (Current goals can be found in the Care Plan section) Acute Rehab PT Goals Patient Stated Goal: none stated PT Goal Formulation: With patient Time For Goal Achievement: 06/09/15 Potential to Achieve Goals: Good    Frequency Min 3X/week   Barriers to discharge        Co-evaluation               End of Session   Activity Tolerance: Patient tolerated treatment well Patient left: in chair;with call bell/phone within reach;with chair alarm set Nurse Communication: Mobility status         Time: 1012 (no charge for time when MD was in room)-1043 PT Time Calculation (min) (ACUTE ONLY): 31 min   Charges:   PT Evaluation $PT Eval Moderate Complexity: 1 Procedure PT Treatments $Therapeutic Activity: 8-22 mins   PT G Codes:        Craig Waters 05/26/2015, 10:51 AM

## 2015-05-26 NOTE — Telephone Encounter (Signed)
PT CONFIRMED APPT FOR 2/21 FOR 10:30

## 2015-05-26 NOTE — Progress Notes (Signed)
     [facility] Progress Note  Subjective:  Doing well with clear liquid diet.  Says that abdomen is just sore.  Smiling this morning and seems in good spirits.  Objective:  Vital signs in last 24 hours: Temp:  [97.9 F (36.6 C)-98.1 F (36.7 C)] 98.1 F (36.7 C) (02/01 0424) Pulse Rate:  [75-93] 92 (02/01 0857) Resp:  [13-19] 18 (02/01 0424) BP: (126-185)/(45-85) 148/72 mmHg (02/01 0857) SpO2:  [95 %-100 %] 96 % (02/01 0857) Last BM Date: 05/20/15 General:  Alert, Well-developed, in NAD Heart:  Regular rate and rhythm; no murmurs Pulm:  CTAB.  No W/R/R. Abdomen:  Soft, non-distended. Normal bowel sounds.  Mild diffuse TTP. Extremities:  Without edema. Neurologic:  Alert and oriented x 4;  grossly normal neurologically. Psych:  Alert and cooperative. Normal mood and affect.  Intake/Output from previous day: 01/31 0701 - 02/01 0700 In: 2060 [P.O.:360; I.V.:1400] Out: 1000 [Urine:650; Emesis/NG output:350]  Dg Abd 1 View - Kub  05/25/2015  CLINICAL DATA:  Duodenal stent placement EXAM: ABDOMEN - 1 VIEW COMPARISON:  05/18/2015, 05/21/2015 FLUOROSCOPY TIME:  Radiation Exposure Index (as provided by the fluoroscopic device): 57.37 mGy If the device does not provide the exposure index: Fluoroscopy Time:  3 minutes 6 seconds Number of Acquired Images:  6 FINDINGS: Initial images demonstrate a a endoscope in second third portion of the duodenum. A wire was placed and subsequently and expandable stent was placed across narrowed area within the duodenum. Final image demonstrates stent significantly narrowed by the underlying mass lesion. IMPRESSION: Duodenal stent placement as described. Electronically Signed   By: Inez Catalina M.D.   On: 05/25/2015 15:25   Dg C-arm 1-60 Min-no Report  05/25/2015  CLINICAL DATA: dueodenal stricture C-ARM 1-60 MINUTES Fluoroscopy was utilized by the requesting physician.  No radiographic interpretation.   Assessment / Plan: *GOO due to  metastatic bladder cancer: EGD 1/26 showed Class D esophagitis, retained solid and liquid gastric contents, and long segment of stenosis/stricture in D2-D3 from extrinsic compression.  S/p duodenal stent placement on 1/31 so far with good results.  Will advance to full liquids today.  Diet should always remain full liquid-pureed/mashed food (nothing with chunks that is not mashed before putting into his mouth).  Will have nutrition see him and instructed further regarding this.  Only needs GI follow-up prn per Dr. Ardis Hughs.  [place] for discharge today from GI standpoint.   LOS: 8 days   ZEHR, JESSICA D.  05/26/2015, 9:01 AM  Pager number [phone]   I saw the above patient with our APP, and she was acting as my scribe. I agree with the above assessment and plan, which we formulated together.  Wilfrid Lund, MD [facility] Pager [phone]

## 2015-05-26 NOTE — Progress Notes (Signed)
Patient ID: Craig Bolanos., male   DOB: 1930/11/28, 83 y.o.   MRN: [id number] [facility] PROGRESS NOTE  [name] [id number] DOB: October 01, 1930 DOA: 05/18/2015 PCP: [name], MD  Brief narrative:    82 y.o. male. Hx BPH, s/p radical prostatectomy. Hx A fib. DVT, s/p IVC filter, unable to take [facility] due to hematuria, stage 4, invasive urothelial bladder cancer, TURBT resection 02/28/2015 (high grade, poor prognosis per onc at [place]), s/p nephrostomy and ureteral tubes, recent admission 1/12 - 05/11/15 with UTI. bil percutaneous nephrostomy tubes 05/08/15, Rad tx by Dr Tammi Klippel. Pt was found to have gastric outlet obstruction on this admission.   Patient had a stent placed 05/25/2015.  Assessment/Plan:    Principal Problem:  Gastric outlet obstruction / Nausea and vomiting - Likely secondary to metastatic bladder cancer - EGD 1/26 showed Class D esophagitis, retained solid and liquid gastric contents and long segment of stenosis/stricture in D2-D3 from extrinsic compression - Upper GI series demonstrated about 8 cm long segment of narrowing of the second and third portion of the duodenum with the most severe area in the third portion over 3.5 cm segment. - Stent placed 05/25/2015 and diet advanced to full liquids. Per GI, patient should remain on full liquids. - Continue PPI therapy  Active Problems:  Urothelial carcinoma ([place]), stage IV - Per oncology, no role for chemo at this time, palliative and hospice recommended. I spoke with Dr. Alen Blew of oncology who will have his office arrange f/u outpt appt  - Appreciate palliative care team following    Ureteral obstruction - Status post bilateral nephrostomy tubes placement 05/08/2015 by urology - Has some pain at the nephrostomy tube site which is likely related to malignancy considering recent CT scan showed that these tubes are in good position - Continue pain management efforts - I spoke with Dr.  Louis Meckel of GU who said it is not yet time to replace nephrotomies.    Leukocytosis - Likely related to steroids - No evidence of acute infectious process    Severe protein calorie malnutrition - In the context of chronic illness - Per GI may have full liquid diet   Hypertensive urgency / essential hypertension - Continue metoprolol 5 mg IV every 12 hours.   History of recent DVT - Patient is status post IVC filter placement - Patient is not anticoagulated secondary to risk of bleed. Had hematuria on the admission.   Atrial Fibrillation - CHA2DS2-VASc Score 3 - Not on AC due to risk of bleed - Continue metoprolol IV 5 mg Q 12 hours for rate and BP control     Anemia of chronic disease - Secondary to combination of chronic kidney disease and malignancy - Hemoglobin stable. No current indications for transfusion   Chronic kidney disease stage 3  - Baseline Cr is 1.66 - Creatinine was within normal limits at the time of the admission    Rheumatoid arthritis - Continue Solu-Medrol just to make sure he tolerates clear liquids  DVT Prophylaxis  - SCDs bilaterally  Code Status: DNR/DNI Family Communication:  Family not at the bedside this am; spoke with daughter over the phone 1/31 Disposition Plan: Possibly by 2/2  IV access:  Peripheral IV  Procedures and diagnostic studies:     Stent placement 05/25/2015 - Duodenal obstruction at level of D2, D3. This was treated with placement of 12cm long uncovered metal mesh stent.  Ct Abdomen Pelvis W Contrast 05/18/2015  There appears to be gastric outlet obstruction  with distended fluid-filled stomach to the level of the second portion the duodenum. There is retroperitoneal infiltration or scarring which may be causing gastric outlet obstruction. Duodenal mass or inflammation not excluded. Diffuse retroperitoneal inflammation or scarring most prominent on the right extending down to the pelvis. Bilateral percutaneous  nephrostomy tubes and left ureteral tube. Diffuse asymmetric bladder wall thickening, likely neoplastic. Loss of distinction between the anterior bladder wall and anterior abdominal wall suggests direct infiltration versus scarring. Metastatic lesion in the right lobe of the liver appears unchanged since prior study.  Dg Ugi W/high Density W/kub 05/21/2015   7.5-8 cm long segment of narrowing of the second and third portions of the duodenum. The most severe area of narrowing is in the third portion of the duodenum over a 3.5-4 cm segment.   Medical Consultants:  Gastroenterology PCT Surgery over the phone Oncology Dr. Lindi Adie   Other Consultants:  None  IAnti-Infectives:   None    Leisa Lenz, MD  [facility] Pager [phone]  Time spent in minutes: 25 minutes  If 7PM-7AM, please contact night-coverage [email or web address] Password [facility] 05/26/2015, 11:32 AM   LOS: 8 days    HPI/Subjective: No acute overnight events. Patient reports uncomfortable with movement at the nephrostomy tubes sites.  Objective: Filed Vitals:   05/25/15 1853 05/25/15 2212 05/26/15 0424 05/26/15 0857  BP: 166/84 169/85 135/64 148/72  Pulse: 89 93 90 92  Temp:  98 F (36.7 C) 98.1 F (36.7 C)   TempSrc:  Oral Oral   Resp:  18 18   Height:      Weight:      SpO2:  96% 97% 96%    Intake/Output Summary (Last 24 hours) at 05/26/15 1132 Last data filed at 05/26/15 0600  Gross per 24 hour  Intake   2060 ml  Output    650 ml  Net   1410 ml    Exam:   General:  Pt is not in distress   Cardiovascular: RRR, (+) S1, S2   Respiratory: Bilateral air entry, no wheezing  Abdomen: Tender at nephrostomy tubes, bowel sounds appreciated  Extremities: No swelling, palpable pulses   Neuro: No focal deficits   Data Reviewed: Basic Metabolic Panel:  Recent Labs Lab 05/20/15 0505 05/21/15 0523 05/22/15 0518  NA 139 136 137  K 4.2 4.0 4.2  CL 108 105 105  CO2 22 21* 19*  GLUCOSE 90 72 43*   BUN 17 19 20   CREATININE 0.98 0.93 1.10  CALCIUM 8.3* 8.1* 8.4*   Liver Function Tests: No results for input(s): AST, ALT, ALKPHOS, BILITOT, PROT, ALBUMIN in the last 168 hours. No results for input(s): LIPASE, AMYLASE in the last 168 hours. No results for input(s): AMMONIA in the last 168 hours. CBC:  Recent Labs Lab 05/20/15 0505 05/21/15 0523 05/22/15 0518  WBC 13.7* 14.8* 13.2*  HGB 11.1* 11.5* 12.1*  HCT 35.7* 36.8* 38.3*  MCV 91.8 90.6 91.0  PLT 334 328 288   Cardiac Enzymes: No results for input(s): CKTOTAL, CKMB, CKMBINDEX, TROPONINI in the last 168 hours. BNP: Invalid input(s): POCBNP CBG:  Recent Labs Lab 05/22/15 0818  GLUCAP 128*    No results found for this or any previous visit (from the past 240 hour(s)).   Scheduled Meds: . feeding supplement  1 Container Oral TID BM  . methylPREDNISolone (SOLU-MEDROL) injection  20 mg Intravenous Daily  . metoprolol  5 mg Intravenous Q12H  . pantoprazole (PROTONIX) IV  40 mg Intravenous Q12H   Continuous  Infusions: . sodium chloride 50 mL/hr at 05/25/15 1800

## 2015-05-26 NOTE — Care Management Note (Signed)
Case Management Note  Patient Details  Name: Shandy Porres. MRN: [id number] Date of Birth: Oct 22, 1930  Subjective/Objective: Referral for home hospice choice-patient off the floor-will offer choice once patient returns.                  Action/Plan:d/c plan home w/home hospice.   Expected Discharge Date:                  Expected Discharge Plan:  [place]  In-House Referral:     Discharge planning Services  CM Consult  Post Acute Care Choice:  Home Health (Active [facility]-HHRN/PT) Choice offered to:     DME Arranged:    DME Agency:     HH Arranged:    HH Agency:     Status of Service:  In process, will continue to follow  Medicare Important Message Given:  Yes Date Medicare IM Given:    Medicare IM give by:    Date Additional Medicare IM Given:    Additional Medicare Important Message give by:     If discussed at [place] of Stay Meetings, dates discussed:    Additional Comments:  Dessa Phi, RN 05/26/2015, 3:14 PM

## 2015-05-26 NOTE — Progress Notes (Signed)
Patient was complaining of "something sharp sticking" him in his skin at both PCN sites.  He described it has "sharp like a pin"  Upon exam of both PCNs, there are in good position. There is no erythema or signs of infection.  There is a Prolene suture in place with several knots tied to secure the tubes.  I used 2 bandaids, cut them at the gauze portion like a drain sponge, and placed them around the tube at the skin to pad/protect the skin from the suture.  There was already a Band-Aid over the site where the tubes connect (which is typically where patients complain of a sharp object sticking the skin).  Mr Thomasson rolled back over onto his back and stated he no longer felt it. He was happy.  Nurse informed and she will replace the dressing around the PCNs.   Judie Grieve Ashleigh Luckow PA-C 05/26/2015 3:46 PM

## 2015-05-27 ENCOUNTER — Telehealth: Payer: Self-pay | Admitting: Radiation Oncology

## 2015-05-27 ENCOUNTER — Ambulatory Visit: Payer: Medicare Other

## 2015-05-27 ENCOUNTER — Ambulatory Visit
Admit: 2015-05-27 | Discharge: 2015-05-27 | Disposition: A | Discharge status: Home / Self Care | Payer: Medicare Other | Attending: Radiation Oncology | Admitting: Radiation Oncology

## 2015-05-27 ENCOUNTER — Inpatient Hospital Stay (HOSPITAL_COMMUNITY): Payer: Medicare Other

## 2015-05-27 DIAGNOSIS — E43 Unspecified severe protein-calorie malnutrition: Secondary | ICD-10-CM

## 2015-05-27 LAB — URINALYSIS, ROUTINE W REFLEX MICROSCOPIC
Bilirubin Urine: NEGATIVE
GLUCOSE, UA: NEGATIVE mg/dL
KETONES UR: NEGATIVE mg/dL
Nitrite: NEGATIVE
Specific Gravity, Urine: 1.024 (ref 1.005–1.030)
pH: 6.5 (ref 5.0–8.0)

## 2015-05-27 LAB — URINE MICROSCOPIC-ADD ON

## 2015-05-27 MED ORDER — PROMETHAZINE HCL 25 MG/ML IJ SOLN: 12.5000 mg | Freq: Four times a day (QID) | INTRAMUSCULAR | Status: DC | PRN

## 2015-05-27 MED ORDER — IOHEXOL 300 MG/ML  SOLN
20.0000 mL | Freq: Once | INTRAMUSCULAR | Status: AC | PRN
  Administered 2015-05-27: 20 mL

## 2015-05-27 MED ORDER — OXYCODONE HCL ER 15 MG PO T12A
15.0000 mg | EXTENDED_RELEASE_TABLET | Freq: Two times a day (BID) | ORAL | Status: DC
  Administered 2015-05-27 – 2015-05-31 (×9): 15 mg via ORAL
  Filled 2015-05-27 (×9): qty 1

## 2015-05-27 MED ORDER — HYDROMORPHONE HCL 1 MG/ML IJ SOLN
1.0000 mg | INTRAMUSCULAR | Status: DC | PRN
  Administered 2015-05-28 (×2): 1 mg via INTRAVENOUS
  Administered 2015-05-29 (×2): 2 mg via INTRAVENOUS
  Administered 2015-05-30: 1 mg via INTRAVENOUS
  Administered 2015-05-30: 2 mg via INTRAVENOUS
  Administered 2015-05-30: 1 mg via INTRAVENOUS
  Administered 2015-05-30: 2 mg via INTRAVENOUS
  Administered 2015-05-31 (×2): 1 mg via INTRAVENOUS
  Administered 2015-05-31 – 2015-06-01 (×2): 2 mg via INTRAVENOUS
  Administered 2015-06-01: 1 mg via INTRAVENOUS
  Administered 2015-06-01: 2 mg via INTRAVENOUS
  Administered 2015-06-01 – 2015-06-02 (×2): 1 mg via INTRAVENOUS
  Administered 2015-06-02: 2 mg via INTRAVENOUS
  Filled 2015-05-27: qty 1
  Filled 2015-05-27 (×3): qty 2
  Filled 2015-05-27 (×3): qty 1
  Filled 2015-05-27 (×3): qty 2
  Filled 2015-05-27: qty 1
  Filled 2015-05-27 (×2): qty 2
  Filled 2015-05-27 (×3): qty 1
  Filled 2015-05-27: qty 2
  Filled 2015-05-27: qty 1

## 2015-05-27 MED ORDER — OXYCODONE HCL 5 MG PO TABS
5.0000 mg | ORAL_TABLET | ORAL | Status: DC | PRN
  Administered 2015-05-27 – 2015-05-31 (×18): 5 mg via ORAL
  Filled 2015-05-27 (×18): qty 1

## 2015-05-27 NOTE — Telephone Encounter (Signed)
Let's check in with him next week to see where he's at! Thanks, Bryson Ha

## 2015-05-27 NOTE — Telephone Encounter (Signed)
Patient refused radiation treatment. Phoned patient's daughter, [name]. [name] reports her father doesn't wish to move forward with any more radiation treatments. She states, "he wants to pull out all the tubes, go home, and let nature take its course." She reports he is tired of being in pain. She request radiation be cancelled for the rest of the week. Will contact daughter again next week to check in. Informed [name], RT on L2 of this finding.

## 2015-05-27 NOTE — Progress Notes (Signed)
Notified by [facility] of pt./family request for [facility] services at home after discharge. Chart and pt. Information under review by [facility] MD.  Writer spoke with patient and his daughter [name] at the bedside to initiate education related to hospice philosophy, services and team approach to care. Family voiced understanding of information provided. Per discussion plan is for discharge to home tomorrow by private vehicle.  Please send signed and completed DNR form home with pt/family. Pt. Will need prescriptions for discharge comfort medications.  DME needs discussed and daughter [name] to notify [facility] of when she wants equipment delivered to the home. Address has been verified and is correct in the chart.[name] is the contact person to be called to arrange time of delivery from [facility].  [facility] Referral Center aware of above. Please notify [facility] when pt. Is ready to leave unit at discharge- call [phone] or ([phone] after 5 pm). [facility] contact numbers have been given to [place] during visit. Above information shared with Juliann Pulse, [facility]. Please call with any questions.  [facility] Liaison [phone]

## 2015-05-27 NOTE — Care Management Note (Signed)
Case Management Note  Patient Details  Name: Alamin Sprunk. MRN: [id number] Date of Birth: 1931-02-05  Subjective/Objective: [facility]-liason following, patient w/many concerns, xrt?-[facility] will continue to address-patient not accepted to [facility] currently.  Nephrostomy tube issues. [facility] rep Santiago Glad still following-[facility] orders already placed.                   Action/Plan:d/c plan home.   Expected Discharge Date:                  Expected Discharge Plan:  [place]  In-House Referral:     Discharge planning Services  CM Consult  Post Acute Care Choice:  Home Health (Active [facility]-HHRN/PT) Choice offered to:     DME Arranged:    DME Agency:     HH Arranged:  RN, PT, OT, Nurse's Aide [place] Agency:  [place]  Status of Service:  In process, will continue to follow  Medicare Important Message Given:  Yes Date Medicare IM Given:    Medicare IM give by:    Date Additional Medicare IM Given:    Additional Medicare Important Message give by:     If discussed at [place] of Stay Meetings, dates discussed:    Additional Comments:  Dessa Phi, RN 05/27/2015, 9:44 AM

## 2015-05-27 NOTE — Progress Notes (Signed)
Daily Progress Note   Patient Name: Craig Waters.       Date: 05/27/2015 DOB: 1931-01-15  Age: 82 y.o. MRN#: [id number] Attending Physician: Robbie Lis, MD Primary Care Physician: [name], MD Admit Date: 05/18/2015  Reason for Consultation/Follow-up: Establishing goals of care and Psychosocial/spiritual support, symptom recommendations as indicated  Subjective:  -continued conversation regarding [place] and disposition options.  Discussed importance of converting to oral pain medications before discharge home  -discussed hospice benefit and patient is hopeful to receive services. [facility] has engaged   - patient tells me today that he is "not interested in anymore radiation", he refused treatment today. He hopes that his nephrostomy tubes will be "adjusted today"   His focus is comfort, quality and dignity, "at home"   Length of Stay: 9 days  Current Medications: Scheduled Meds:  . feeding supplement  1 Container Oral TID BM  . methylPREDNISolone (SOLU-MEDROL) injection  20 mg Intravenous Daily  . metoprolol  5 mg Intravenous Q12H  . oxyCODONE  15 mg Oral Q12H  . pantoprazole (PROTONIX) IV  40 mg Intravenous Q12H    Continuous Infusions: . sodium chloride 50 mL/hr at 05/26/15 1938    PRN Meds: hydrALAZINE, HYDROmorphone (DILAUDID) injection, ondansetron (ZOFRAN) IV, oxyCODONE, promethazine  Physical Exam: Physical Exam  Constitutional: He is oriented to person, place, and time. He appears lethargic. He appears ill.  HENT:  Mouth/Throat: Oropharynx is clear and moist. Mucous membranes are dry.  Cardiovascular: Normal rate, regular rhythm and normal heart sounds.   Pulmonary/Chest: He has decreased breath sounds in the right lower field and the left lower field.    Abdominal: Soft. Bowel sounds are normal. There is no tenderness.  Musculoskeletal:       Right shoulder: He exhibits decreased strength.  Neurological: He is oriented to person, place, and time. He appears lethargic. He displays atrophy.  Skin: Skin is warm and dry.                Vital Signs: BP 114/61 mmHg  Pulse 76  Temp(Src) 98.1 F (36.7 C) (Oral)  Resp 18  Ht 5\' 11"  (1.803 m)  Wt 91.8 kg (202 lb 6.1 oz)  BMI 28.24 kg/m2  SpO2 98% SpO2: SpO2: 98 % O2 Device: O2 Device: Not Delivered O2 Flow Rate: O2 Flow  Rate (L/min): 4 L/min  Intake/output summary:   Intake/Output Summary (Last 24 hours) at 05/27/15 1330 Last data filed at 05/27/15 0700  Gross per 24 hour  Intake 1994.16 ml  Output    640 ml  Net 1354.16 ml   LBM: Last BM Date: 05/20/15 Baseline Weight: Weight: 92.987 kg (205 lb) Most recent weight: Weight: 91.8 kg (202 lb 6.1 oz)       Palliative Assessment/Data: Flowsheet Rows        Most Recent Value   Intake Tab    Referral Department  Hospitalist   Unit at Time of Referral  Cardiac/Telemetry Unit   Palliative Care Primary Diagnosis  Cancer   Date Notified  05/19/15   Palliative Care Type  New Palliative care   Reason for referral  Clarify Goals of Care   Date of Admission  05/18/15   Date first seen by Palliative Care  05/20/15   # of days Palliative referral response time  1 Day(s)   # of days IP prior to Palliative referral  1   Clinical Assessment    Psychosocial & Spiritual Assessment    Palliative Care Outcomes       Additional Data Reviewed: CBC    Component Value Date/Time   WBC 13.2* 05/22/2015 0518   RBC 4.21* 05/22/2015 0518   HGB 12.1* 05/22/2015 0518   HCT 38.3* 05/22/2015 0518   PLT 288 05/22/2015 0518   MCV 91.0 05/22/2015 0518   MCH 28.7 05/22/2015 0518   MCHC 31.6 05/22/2015 0518   RDW 14.8 05/22/2015 0518   LYMPHSABS 1.3 03/14/2015 1220   MONOABS 0.9 03/14/2015 1220   EOSABS 0.1 03/14/2015 1220   BASOSABS 0.0  03/14/2015 1220    CMP     Component Value Date/Time   NA 137 05/22/2015 0518   K 4.2 05/22/2015 0518   CL 105 05/22/2015 0518   CO2 19* 05/22/2015 0518   GLUCOSE 43* 05/22/2015 0518   BUN 20 05/22/2015 0518   CREATININE 1.10 05/22/2015 0518   CALCIUM 8.4* 05/22/2015 0518   PROT 6.6 05/18/2015 2140   ALBUMIN 3.1* 05/18/2015 2140   AST 21 05/18/2015 2140   ALT 19 05/18/2015 2140   ALKPHOS 98 05/18/2015 2140   BILITOT 0.6 05/18/2015 2140   GFRNONAA >60 05/22/2015 0518   GFRAA >60 05/22/2015 0518       Problem List:  Patient Active Problem List   Diagnosis Date Noted  . Pain, cancer   . DNR (do not resuscitate)   . Palliative care encounter   . Nausea with vomiting   . Hypertensive urgency 05/21/2015  . CKD (chronic kidney disease) stage 3, GFR 30-59 ml/min 05/21/2015  . History of DVT (deep vein thrombosis) 05/21/2015  . Leukocytosis 05/21/2015  . Duodenal stricture   . Abnormal CT scan, small bowel   . Malnutrition of moderate degree 05/19/2015  . Gastric outlet obstruction 05/18/2015  . Atrial fibrillation with RVR ([place]) 03/14/2015  . Urothelial carcinoma ([place]) 02/15/2015  . Rheumatoid arthritis ([place]) 02/15/2015     Palliative Care Assessment & Plan    1.Code Status:   DNR       Code Status Orders        Start     Ordered   05/19/15 0002  Do not attempt resuscitation (DNR)   Continuous    Question Answer Comment  In the event of cardiac or respiratory ARREST Do not call a "code blue"   In the event of cardiac or  respiratory ARREST Do not perform Intubation, CPR, defibrillation or ACLS   In the event of cardiac or respiratory ARREST Use medication by any route, position, wound care, and other measures to relive pain and suffering. May use oxygen, suction and manual treatment of airway obstruction as needed for comfort.      05/19/15 0002    Code Status History    Date Active Date Inactive Code Status Order ID Comments User Context   05/07/2015   2:31 AM 05/11/2015  3:15 PM Full Code [id number]  Ivor Costa, MD ED   04/05/2015  3:59 AM 04/09/2015  2:58 PM Full Code [id number]  Oswald Hillock, MD Inpatient   03/14/2015  4:26 PM 03/20/2015  6:52 PM Full Code [id number]  Elmarie Shiley, MD Inpatient   02/15/2015  8:15 PM 02/22/2015 10:17 PM Full Code [id number]  Venetia Maxon Rama, MD Inpatient   05/15/2014 12:30 AM 05/15/2014  8:16 PM Full Code [id number]  Allyne Gee, MD Inpatient   07/04/2012  8:45 PM 07/08/2012  4:33 PM Full Code [id number]  Derrill Kay, MD ED   11/08/2011 11:45 PM 11/13/2011  5:35 AM Full Code [id number]  Lenis Noon, RN Inpatient    Advance Directive Documentation        Most Recent Value   Type of Advance Directive  Healthcare Power of Attorney   Pre-existing out of facility DNR order (yellow form or pink MOST form)     "MOST" Form in Place?         Desire for further Chaplaincy support:no  Psycho-social Needs: Education on Hospice  3. Symptom Management:      1. Covert IV medication to oral agents in anticipation for discharge home                Oxycodone 5 mg po every 3 hrs prn Add: OxyContin ER 15 mg po every 12 hrs  Use IV medication only after oral agents have been exhausted  Discussed with patetin andhis dughter side-effect of constipation  4. Palliative Prophylaxis:   Aspiration, Bowel Regimen, Frequent Pain Assessment and Oral Care   6. Discharge Planning:  Home with hospice services  Discussed with Dr Charlies Silvers   Thank you for allowing the Palliative Medicine Team to assist in the care of this patient.   Time In:  1225 Time Out: 1300 Total Time 35 min Prolonged Time Billed  no         Knox Royalty, NP  05/27/2015, 1:30 PM  Please contact Palliative Medicine Team phone at [phone] for questions and concerns.

## 2015-05-27 NOTE — Progress Notes (Addendum)
Patient ID: Craig Waters., male   DOB: 04-12-1931, 82 y.o.   MRN: [id number] [facility] PROGRESS NOTE  [name] [id number] DOB: [birth year] DOA: 05/18/2015 PCP: [name], MD  Brief narrative:    82 y.o. male. Hx BPH, s/p radical prostatectomy. Hx A fib. DVT, s/p IVC filter, unable to take [facility] due to hematuria, stage 4, invasive urothelial bladder cancer, TURBT resection 02/28/2015 (high grade, poor prognosis per onc at [place]), s/p nephrostomy and ureteral tubes, recent admission 1/12 - 05/11/15 with UTI. bil percutaneous nephrostomy tubes 05/08/15, Rad tx by Dr Tammi Klippel. Pt was found to have gastric outlet obstruction on this admission.   Patient had a stent placed 05/25/2015.  Assessment/Plan:    Principal Problem:  Gastric outlet obstruction / Nausea and vomiting - Likely secondary to metastatic bladder cancer - EGD 1/26 showed Class D esophagitis, retained solid and liquid gastric contents and long segment of stenosis/stricture in D2-D3 from extrinsic compression - Upper GI series demonstrated about 8 cm long segment of narrowing of the second and third portion of the duodenum with the most severe area in the third portion over 3.5 cm segment. - Stent placed 05/25/2015  - Per GI, continue full liquid diet - Continue Protonix  Active Problems:  Urothelial carcinoma ([place]), stage IV - Per oncology, no role for chemo at this time, palliative and hospice recommended. I spoke with Dr. Alen Blew of oncology who will have his office arrange f/u outpt appt  - Appreciate palliative care team following    Ureteral obstruction - Status post bilateral nephrostomy tubes placement 05/08/2015 by urology - We'll ask interventional radiology if they can replace these to [place] because patient is still uncomfortable.   Leukocytosis - Likely related to steroids - No evidence of acute infectious process    Severe protein calorie malnutrition - In the context of  chronic illness - Full liquid diet per GI recommendations   Hypertensive urgency / essential hypertension - Continue metoprolol 5 mg IV every 12 hours. - Blood pressure 114/61 and heart rate 76   History of recent DVT - Patient is status post IVC filter placement - Patient is not anticoagulated secondary to risk of bleed. Had hematuria on the admission.   Atrial Fibrillation - CHA2DS2-VASc Score 3 - Not on AC due to risk of bleed - Continue metoprolol IV 5 mg Q 12 hours for rate and BP control     Anemia of chronic disease - Secondary to combination of chronic kidney disease and malignancy - Hemoglobin stable. No current indications for transfusion - Check CBC in the morning   Chronic kidney disease stage 3  - Baseline Cr is 1.66 - Creatinine was within normal limits at the time of the admission    Rheumatoid arthritis - Continue Solu-Medrol while patient in hospital. He can only have opted full liquid diet 6 reasonable to continue Solu-Medrol instead of by mouth prednisone.  DVT Prophylaxis  - SCDs bilaterally while patient in hospital  Code Status: DNR/DNI Family Communication:  Family not at the bedside this am; spoke with daughter over the phone 1/31, 21, 2/2 Disposition Plan: Home once pain better controlled  IV access:  Peripheral IV  Procedures and diagnostic studies:     Stent placement 05/25/2015 - Duodenal obstruction at level of D2, D3. This was treated with placement of 12cm long uncovered metal mesh stent.  Ct Abdomen Pelvis W Contrast 05/18/2015  There appears to be gastric outlet obstruction with distended fluid-filled stomach to the  level of the second portion the duodenum. There is retroperitoneal infiltration or scarring which may be causing gastric outlet obstruction. Duodenal mass or inflammation not excluded. Diffuse retroperitoneal inflammation or scarring most prominent on the right extending down to the pelvis. Bilateral percutaneous nephrostomy  tubes and left ureteral tube. Diffuse asymmetric bladder wall thickening, likely neoplastic. Loss of distinction between the anterior bladder wall and anterior abdominal wall suggests direct infiltration versus scarring. Metastatic lesion in the right lobe of the liver appears unchanged since prior study.  Dg Ugi W/high Density W/kub 05/21/2015   7.5-8 cm long segment of narrowing of the second and third portions of the duodenum. The most severe area of narrowing is in the third portion of the duodenum over a 3.5-4 cm segment.   Medical Consultants:  Gastroenterology PCT Surgery over the phone Oncology Dr. Lindi Adie   Other Consultants:  None  IAnti-Infectives:   None    Leisa Lenz, MD  [facility] Pager [phone]  Time spent in minutes: 25 minutes  If 7PM-7AM, please contact night-coverage [email or web address] Password [email or web address] 05/27/2015, 11:37 AM   LOS: 9 days    HPI/Subjective: No acute overnight events. Patient still very uncomfortable, says he is miserable with pain especially with a nephrostomy tubes. The wires are still touching the skin, he can't sleep at night, he can get comfortable.  Objective: Filed Vitals:   05/26/15 1659 05/26/15 2056 05/27/15 0405 05/27/15 1030  BP: 162/82 156/79 166/83 114/61  Pulse: 85 88 83 76  Temp: 98.1 F (36.7 C) 97.5 F (36.4 C) 98 F (36.7 C) 98.1 F (36.7 C)  TempSrc: Oral Oral Oral Oral  Resp: 18 18 18    Height:      Weight:      SpO2: 97% 99% 96% 98%    Intake/Output Summary (Last 24 hours) at 05/27/15 1137 Last data filed at 05/27/15 0700  Gross per 24 hour  Intake 1994.16 ml  Output    870 ml  Net 1124.16 ml    Exam:   General:  Pt is alert, awake  Cardiovascular: Rate control, appreciate S1-S2  Respiratory: No wheezing, no rhonchi  Abdomen: Still pain at the nephrostomy tube sites where wires are touching the skin, bowel sounds appreciated, no tenderness in abdominal area  Extremities: No edema, pulses  palpable bilaterally  Neuro: No focal neurological deficits  Data Reviewed: Basic Metabolic Panel:  Recent Labs Lab 05/21/15 0523 05/22/15 0518  NA 136 137  K 4.0 4.2  CL 105 105  CO2 21* 19*  GLUCOSE 72 43*  BUN 19 20  CREATININE 0.93 1.10  CALCIUM 8.1* 8.4*   Liver Function Tests: No results for input(s): AST, ALT, ALKPHOS, BILITOT, PROT, ALBUMIN in the last 168 hours. No results for input(s): LIPASE, AMYLASE in the last 168 hours. No results for input(s): AMMONIA in the last 168 hours. CBC:  Recent Labs Lab 05/21/15 0523 05/22/15 0518  WBC 14.8* 13.2*  HGB 11.5* 12.1*  HCT 36.8* 38.3*  MCV 90.6 91.0  PLT 328 288   Cardiac Enzymes: No results for input(s): CKTOTAL, CKMB, CKMBINDEX, TROPONINI in the last 168 hours. BNP: Invalid input(s): POCBNP CBG:  Recent Labs Lab 05/22/15 0818  GLUCAP 128*    No results found for this or any previous visit (from the past 240 hour(s)).   Scheduled Meds: . feeding supplement  1 Container Oral TID BM  . methylPREDNISolone (SOLU-MEDROL) injection  20 mg Intravenous Daily  . metoprolol  5 mg Intravenous Q12H  . pantoprazole (  PROTONIX) IV  40 mg Intravenous Q12H   Continuous Infusions: . sodium chloride 50 mL/hr at 05/26/15 [id number]

## 2015-05-28 ENCOUNTER — Ambulatory Visit
Admit: 2015-05-28 | Discharge: 2015-05-28 | Disposition: A | Discharge status: Home / Self Care | Payer: Medicare Other | Attending: Radiation Oncology | Admitting: Radiation Oncology

## 2015-05-28 ENCOUNTER — Ambulatory Visit: Payer: Medicare Other | Admitting: Radiation Oncology

## 2015-05-28 ENCOUNTER — Ambulatory Visit: Payer: Medicare Other

## 2015-05-28 DIAGNOSIS — N390 Urinary tract infection, site not specified: Secondary | ICD-10-CM

## 2015-05-28 LAB — CBC
HCT: 37.2 % — ABNORMAL LOW (ref 39.0–52.0)
HEMOGLOBIN: 11.9 g/dL — AB (ref 13.0–17.0)
MCH: 28.7 pg (ref 26.0–34.0)
MCHC: 32 g/dL (ref 30.0–36.0)
MCV: 89.6 fL (ref 78.0–100.0)
PLATELETS: 235 10*3/uL (ref 150–400)
RBC: 4.15 MIL/uL — AB (ref 4.22–5.81)
RDW: 15.1 % (ref 11.5–15.5)
WBC: 12.1 10*3/uL — AB (ref 4.0–10.5)

## 2015-05-28 LAB — BASIC METABOLIC PANEL
ANION GAP: 7 (ref 5–15)
BUN: 26 mg/dL — ABNORMAL HIGH (ref 6–20)
CHLORIDE: 107 mmol/L (ref 101–111)
CO2: 27 mmol/L (ref 22–32)
Calcium: 8.8 mg/dL — ABNORMAL LOW (ref 8.9–10.3)
Creatinine, Ser: 1.04 mg/dL (ref 0.61–1.24)
Glucose, Bld: 123 mg/dL — ABNORMAL HIGH (ref 65–99)
POTASSIUM: 4 mmol/L (ref 3.5–5.1)
SODIUM: 141 mmol/L (ref 135–145)

## 2015-05-28 LAB — PATHOLOGIST SMEAR REVIEW

## 2015-05-28 MED ORDER — HYDROMORPHONE HCL 1 MG/ML IJ SOLN
0.5000 mg | Freq: Once | INTRAMUSCULAR | Status: AC
  Administered 2015-05-28: 0.5 mg via INTRAVENOUS
  Filled 2015-05-28: qty 1

## 2015-05-28 MED ORDER — DEXTROSE 5 % IV SOLN
1.0000 g | INTRAVENOUS | Status: DC
  Administered 2015-05-28 – 2015-06-02 (×6): 1 g via INTRAVENOUS
  Filled 2015-05-28 (×6): qty 10

## 2015-05-28 MED ORDER — PHENAZOPYRIDINE HCL 100 MG PO TABS
100.0000 mg | ORAL_TABLET | Freq: Three times a day (TID) | ORAL | Status: AC
Start: 2015-05-29 — End: 2015-05-31
  Administered 2015-05-28 – 2015-05-31 (×9): 100 mg via ORAL
  Filled 2015-05-28 (×10): qty 1

## 2015-05-28 NOTE — Progress Notes (Signed)
Pt is not able to reach an appropriate level of comfort. C/O of burning upon urination. States burn like fire. Pain both flank area. NP on called notified. SRP, RN

## 2015-05-28 NOTE — Progress Notes (Signed)
Resting and states relief from Pyridium while urinating. SRP, RN

## 2015-05-28 NOTE — Progress Notes (Signed)
Physical Therapy Treatment Patient Details Name: Craig Waters. MRN: [id number] DOB: 01-23-1931 Today's Date: 05/28/2015    History of Present Illness Craig Waters. is a 82 y.o. male with h/o stage 4 bladder cancer, mets to liver, abdomen. Patient presents to the ED with 3 day history of moderate abdominal pain, N/V, and dry heaving. Pt s/p EGD w/ transendoscopic stent (05/25/15) PMH includeds, RA, DVT, IVC filter, vertigo, PAF, CKD, glaucoma    PT Comments    Pt very weak but willing to try.  Assisted from supine to EOB.  Pt sat EOB Indep several min as RN gave IV meds.  Assisted with standing then amb only 6 feet with recliner following behind due to MAX c/o weakness.  Positioned in recliner as pt agreed to stay up. Pt plans to D/C to home.  Follow Up Recommendations  Home health PT [facility])     Equipment Recommendations       Recommendations for Other Services       Precautions / Restrictions Precautions Precautions: Fall Precaution Comments: bil neph tubes Restrictions Weight Bearing Restrictions: No    Mobility  Bed Mobility Overal bed mobility: Needs Assistance Bed Mobility: Supine to Sit     Supine to sit: Supervision     General bed mobility comments: use of rail and elevated HOB  Transfers Overall transfer level: Needs assistance Equipment used: Rolling walker (2 wheeled) Transfers: Sit to/from Stand Sit to Stand: Min assist;Mod assist         General transfer comment: increased assist to rise  Ambulation/Gait Ambulation/Gait assistance: Min assist;Mod assist;+2 safety/equipment Ambulation Distance (Feet): 6 Feet Assistive device: Rolling walker (2 wheeled)   Gait velocity: decreased   General Gait Details: pt has not attempted amb past 2 weeks.  Recliner following behind for safety.  Limited distance due to MAX c/o weakness/fatigue/no energy.    Stairs            Wheelchair Mobility    Modified Rankin (Stroke Patients Only)        Balance                                    Cognition Arousal/Alertness: Awake/alert Behavior During Therapy: WFL for tasks assessed/performed Overall Cognitive Status: Within Functional Limits for tasks assessed                      Exercises      General Comments        Pertinent Vitals/Pain Pain Assessment: Faces Faces Pain Scale: Hurts even more Pain Location: bladder after voiding and ABD "gas pains" Pain Descriptors / Indicators: Throbbing;Stabbing Pain Intervention(s): Monitored during session;Repositioned    Home Living                      Prior Function            PT Goals (current goals can now be found in the care plan section) Progress towards PT goals: Progressing toward goals    Frequency  Min 3X/week    PT Plan Current plan remains appropriate    Co-evaluation             End of Session Equipment Utilized During Treatment: Gait belt Activity Tolerance: Treatment limited secondary to medical complications (Comment);Patient limited by fatigue Patient left: in chair;with call bell/phone within reach;with chair alarm set     Time: [phone]  PT Time Calculation (min) (ACUTE ONLY): 30 min  Charges:  $Gait Training: 8-22 mins $Therapeutic Activity: 8-22 mins                    G Codes:      Rica Koyanagi  PTA [facility]  Acute  Rehab Pager      [phone]

## 2015-05-28 NOTE — Progress Notes (Signed)
Patient ID: Craig Waters., male   DOB: 01-02-1931, 82 y.o.   MRN: [id number] [facility] PROGRESS NOTE  Craig Waters. [id number] DOB: May 31, 1930 DOA: 05/18/2015 PCP: Craig Argyle, MD  Brief narrative:    82 y.o. male. Hx BPH, s/p radical prostatectomy. Hx A fib. DVT, s/p IVC filter, unable to take [facility] due to hematuria, stage 4, invasive urothelial bladder cancer, TURBT resection 02/28/2015 (high grade, poor prognosis per onc at [place]), s/p nephrostomy and ureteral tubes, recent admission 1/12 - 05/11/15 with UTI. bil percutaneous nephrostomy tubes 05/08/15, Rad tx by Dr Tammi Waters. Pt was found to have gastric outlet obstruction on this admission.   Patient had a stent placed 05/25/2015.  Barrier to discharge: Uncontrolled pain, I anticipate discharge once his pain is better controlled. Allow to use IV Dilaudid if needed for pain control.  Assessment/Plan:    Principal Problem:  Gastric outlet obstruction in the second portion of duodenum with retroperitoneal infiltration  - Based on CT scan findings, gastric outlet obstruction with distended fluid filled stomach to the level of the second portion of the duodenum with of retroperitoneal infiltration or scarring which may be causing gastric outlet obstruction - EGD done 1/26 demonstrated Class D esophagitis, retained solid and liquid gastric contents and long segment of stenosis/stricture in D2-D3 from extrinsic compression - Upper GI series demonstrated about 8 cm long segment of narrowing of the second and third portion of the duodenum with the most severe area in the third portion over 3.5 cm segment. - Stent placed 05/25/2015  - On full liquids per GI   Active Problems:  Urothelial carcinoma ([place]), stage IV / Bladder wall thickening  - Per oncology, no role for chemo at this time, palliative and hospice recommended. I spoke with Craig Waters of oncology who will have his office arrange f/u outpt appt  -  Appreciate palliative care team following    Ureteral obstruction - Status post bilateral nephrostomy tubes placement 05/08/2015 by urology - IR to help with adjustment of the tubes to allow more comfort     UTI - Started empiric Rocephin 2/3 - Check urine culture    Leukocytosis - Likely related to steroids - No evidence of acute infectious process    Severe protein calorie malnutrition - In the context of chronic illness - Full liquid diet per GI recommendations   Hypertensive urgency / essential hypertension - Continue metoprolol 5 mg IV every 12 hours. - Blood pressure 114/61 and heart rate 76   History of recent DVT - Patient is status post IVC filter placement - Patient is not anticoagulated secondary to risk of bleed. Still intermittent hematuria    Atrial Fibrillation - CHA2DS2-VASc Score 3 - Not on AC due to risk of bleed - Continue metoprolol IV 5 mg Q 12 hours for rate and BP control     Anemia of chronic disease - Secondary to combination of chronic kidney disease and malignancy - Hemoglobin stable.    Chronic kidney disease stage 3  - Baseline Cr is 1.66 - Creatinine WNL    Rheumatoid arthritis - Continue Solu-Medrol while patient in hospital. He can only have opted full liquid diet 6 reasonable to continue Solu-Medrol instead of by mouth prednisone.  DVT Prophylaxis  - SCDs bilaterally   Code Status: DNR/DNI Family Communication:  Family not at the bedside this am; spoke with daughter over the phone daily  Disposition Plan: Home once pain better controlled  IV access:  Peripheral IV  Procedures  and diagnostic studies:     Stent placement 05/25/2015 - Duodenal obstruction at level of D2, D3. This was treated with placement of 12cm long uncovered metal mesh stent.  Ct Abdomen Pelvis W Contrast 05/18/2015  There appears to be gastric outlet obstruction with distended fluid-filled stomach to the level of the second portion the duodenum. There  is retroperitoneal infiltration or scarring which may be causing gastric outlet obstruction. Duodenal mass or inflammation not excluded. Diffuse retroperitoneal inflammation or scarring most prominent on the right extending down to the pelvis. Bilateral percutaneous nephrostomy tubes and left ureteral tube. Diffuse asymmetric bladder wall thickening, likely neoplastic. Loss of distinction between the anterior bladder wall and anterior abdominal wall suggests direct infiltration versus scarring. Metastatic lesion in the right lobe of the liver appears unchanged since prior study.  Dg Ugi W/high Density W/kub 05/21/2015   7.5-8 cm long segment of narrowing of the second and third portions of the duodenum. The most severe area of narrowing is in the third portion of the duodenum over a 3.5-4 cm segment.   Medical Consultants:  Gastroenterology PCT Surgery over the phone Oncology Dr. Lindi Waters  IR  Other Consultants:  None  IAnti-Infectives:   Rocephin 05/28/2015 -->    Craig Lenz, MD  [facility] Pager [phone]  Time spent in minutes: 25 minutes  If 7PM-7AM, please contact night-coverage [email or web address] Password [email or web address] 05/28/2015, 11:40 AM   LOS: 10 days    HPI/Subjective: No acute overnight events.   Objective: Filed Vitals:   05/27/15 1540 05/27/15 2108 05/28/15 0441 05/28/15 0924  BP: 138/77 154/77 134/63 120/52  Pulse: 92 93 87   Temp: 97.9 F (36.6 C) 97.7 F (36.5 C) 97.6 F (36.4 C)   TempSrc: Oral Oral Oral   Resp: 18 18 18    Height:      Weight:      SpO2: 99% 97% 96%     Intake/Output Summary (Last 24 hours) at 05/28/15 1140 Last data filed at 05/28/15 0617  Gross per 24 hour  Intake    750 ml  Output    500 ml  Net    250 ml    Exam:   General:  Pt is sleeping, no distress  Cardiovascular: RRR, (+) S1, S2   Respiratory: bilateral air entry, no wheezing   Abdomen: (+) BS, non distended   Extremities: No swelling, palpable pulses   Neuro: Non  focal   Data Reviewed: Basic Metabolic Panel:  Recent Labs Lab 05/22/15 0518 05/28/15 0545  NA 137 141  K 4.2 4.0  CL 105 107  CO2 19* 27  GLUCOSE 43* 123*  BUN 20 26*  CREATININE 1.10 1.04  CALCIUM 8.4* 8.8*   Liver Function Tests: No results for input(s): AST, ALT, ALKPHOS, BILITOT, PROT, ALBUMIN in the last 168 hours. No results for input(s): LIPASE, AMYLASE in the last 168 hours. No results for input(s): AMMONIA in the last 168 hours. CBC:  Recent Labs Lab 05/22/15 0518 05/28/15 0545  WBC 13.2* 12.1*  HGB 12.1* 11.9*  HCT 38.3* 37.2*  MCV 91.0 89.6  PLT 288 235   Cardiac Enzymes: No results for input(s): CKTOTAL, CKMB, CKMBINDEX, TROPONINI in the last 168 hours. BNP: Invalid input(s): POCBNP CBG:  Recent Labs Lab 05/22/15 0818  GLUCAP 128*    No results found for this or any previous visit (from the past 240 hour(s)).   Scheduled Meds: . cefTRIAXone (ROCEPHIN)  IV  1 g Intravenous Q24H  . feeding supplement  1  Container Oral TID BM  . methylPREDNISolone (SOLU-MEDROL) injection  20 mg Intravenous Daily  . metoprolol  5 mg Intravenous Q12H  . oxyCODONE  15 mg Oral Q12H  . pantoprazole (PROTONIX) IV  40 mg Intravenous Q12H   Continuous Infusions: . sodium chloride 50 mL/hr at 05/28/15 0043

## 2015-05-29 LAB — BASIC METABOLIC PANEL
ANION GAP: 8 (ref 5–15)
BUN: 28 mg/dL — AB (ref 6–20)
CO2: 24 mmol/L (ref 22–32)
Calcium: 8.3 mg/dL — ABNORMAL LOW (ref 8.9–10.3)
Chloride: 107 mmol/L (ref 101–111)
Creatinine, Ser: 1.08 mg/dL (ref 0.61–1.24)
GFR calc Af Amer: >60 mL/min (ref >60)
Glucose, Bld: 100 mg/dL — ABNORMAL HIGH (ref 65–99)
POTASSIUM: 3.4 mmol/L — AB (ref 3.5–5.1)
SODIUM: 139 mmol/L (ref 135–145)

## 2015-05-29 LAB — CBC
HCT: 38.4 % — ABNORMAL LOW (ref 39.0–52.0)
Hemoglobin: 12.1 g/dL — ABNORMAL LOW (ref 13.0–17.0)
MCH: 28.3 pg (ref 26.0–34.0)
MCHC: 31.5 g/dL (ref 30.0–36.0)
MCV: 89.7 fL (ref 78.0–100.0)
Platelets: 216 10*3/uL (ref 150–400)
RBC: 4.28 MIL/uL (ref 4.22–5.81)
RDW: 15.3 % (ref 11.5–15.5)
WBC: 14.1 10*3/uL — AB (ref 4.0–10.5)

## 2015-05-29 MED ORDER — DOCUSATE SODIUM 100 MG PO CAPS
100.0000 mg | ORAL_CAPSULE | Freq: Two times a day (BID) | ORAL | Status: DC | PRN
  Administered 2015-05-29 – 2015-05-31 (×3): 100 mg via ORAL
  Filled 2015-05-29 (×3): qty 1

## 2015-05-29 NOTE — Progress Notes (Addendum)
Pt able to void with decreased pain after pyridium administered.  Will continue to follow through. SRP, RN

## 2015-05-29 NOTE — Progress Notes (Signed)
Patient ID: Craig Waters., male   DOB: [birth year], 82 y.o.   MRN: [id number] [facility] PROGRESS NOTE  [name] [id number] DOB: 1930-05-30 DOA: 05/18/2015 PCP: [name], MD  Brief narrative:    82 y.o. male. Hx BPH, s/p radical prostatectomy. Hx A fib. DVT, s/p IVC filter, unable to take [facility] due to hematuria, stage 4, invasive urothelial bladder cancer, TURBT resection 02/28/2015 (high grade, poor prognosis per onc at [place]), s/p nephrostomy and ureteral tubes, recent admission 1/12 - 05/11/15 with UTI. bil percutaneous nephrostomy tubes 05/08/15, Rad tx by Dr Tammi Klippel. Pt was found to have gastric outlet obstruction on this admission.   Patient had a stent placed 05/25/2015.  Barrier to discharge: Uncontrolled pain  Assessment/Plan:    Principal Problem:  Gastric outlet obstruction in the second portion of duodenum with retroperitoneal infiltration  - Based on CT scan findings, gastric outlet obstruction with distended fluid filled stomach to the level of the second portion of the duodenum with of retroperitoneal infiltration or scarring which may be causing gastric outlet obstruction - EGD done 1/26 demonstrated Class D esophagitis, retained solid and liquid gastric contents and long segment of stenosis/stricture in D2-D3 from extrinsic compression - Upper GI series demonstrated about 8 cm long segment of narrowing of the second and third portion of the duodenum with the most severe area in the third portion over 3.5 cm segment. - Stent placed 05/25/2015  - Continue full liquids   Active Problems:  Urothelial carcinoma ([place]), stage IV / Bladder wall thickening  - Per oncology, no role for chemo at this time, palliative and hospice recommended. I spoke with Dr. Alen Blew of oncology who will have his office arrange f/u outpt appt  - PCT following     Ureteral obstruction - Status post bilateral nephrostomy tubes placement 05/08/2015 by urology    UTI - Started empiric Rocephin 2/3 - Urine culture pending    Leukocytosis - Related to steroids    Severe protein calorie malnutrition - In the context of chronic illness - Full liquid diet per GI recommendations   Hypertensive urgency / essential hypertension - Continue metoprolol 5 mg IV every 12 hours.   History of recent DVT - Patient is status post IVC filter placement - Patient is not anticoagulated secondary to risk of bleed.    Atrial Fibrillation - CHA2DS2-VASc Score 3 - Not on AC due to risk of bleed - Continue metoprolol IV 5 mg Q 12 hours     Anemia of chronic disease - Secondary to combination of chronic kidney disease and malignancy - Hemoglobin stable.  - Still with intermittent hematuria    Chronic kidney disease stage 3  - Baseline Cr is 1.66 - Creatinine WNL    Rheumatoid arthritis - Continue Solu-Medrol while patient in hospital. He can only have opted full liquid diet 6 reasonable to continue Solu-Medrol instead of by mouth prednisone.  DVT Prophylaxis  - SCDs bilaterally in hospital   Code Status: DNR/DNI Family Communication:  Family not at the bedside this am; spoke with daughter over the phone daily  Disposition Plan: Home once pain better controlled  IV access:  Peripheral IV  Procedures and diagnostic studies:     Stent placement 05/25/2015 - Duodenal obstruction at level of D2, D3. This was treated with placement of 12cm long uncovered metal mesh stent.  Ct Abdomen Pelvis W Contrast 05/18/2015  There appears to be gastric outlet obstruction with distended fluid-filled stomach to the level of the  second portion the duodenum. There is retroperitoneal infiltration or scarring which may be causing gastric outlet obstruction. Duodenal mass or inflammation not excluded. Diffuse retroperitoneal inflammation or scarring most prominent on the right extending down to the pelvis. Bilateral percutaneous nephrostomy tubes and left ureteral  tube. Diffuse asymmetric bladder wall thickening, likely neoplastic. Loss of distinction between the anterior bladder wall and anterior abdominal wall suggests direct infiltration versus scarring. Metastatic lesion in the right lobe of the liver appears unchanged since prior study.  Dg Ugi W/high Density W/kub 05/21/2015   7.5-8 cm long segment of narrowing of the second and third portions of the duodenum. The most severe area of narrowing is in the third portion of the duodenum over a 3.5-4 cm segment.   Medical Consultants:  Gastroenterology PCT Surgery over the phone Oncology Dr. Lindi Adie  IR  Other Consultants:  None  IAnti-Infectives:   Rocephin 05/28/2015 -->    Leisa Lenz, MD  [facility] Pager [phone]  Time spent in minutes: 15 minutes  If 7PM-7AM, please contact night-coverage [email or web address] Password [email or web address] 05/29/2015, 11:08 AM   LOS: 11 days    HPI/Subjective: No acute overnight events. Pain still there. He says he feels miserable.   Objective: Filed Vitals:   05/28/15 0924 05/28/15 1518 05/28/15 2230 05/29/15 0512  BP: 120/52 121/72 133/70 137/74  Pulse:  84 78 99  Temp:  97.7 F (36.5 C) 97.4 F (36.3 C) 98.2 F (36.8 C)  TempSrc:  Oral Oral Oral  Resp:   12 16  Height:      Weight:      SpO2:  95% 95% 98%    Intake/Output Summary (Last 24 hours) at 05/29/15 1108 Last data filed at 05/29/15 0400  Gross per 24 hour  Intake    650 ml  Output    375 ml  Net    275 ml    Exam:   General:  Pt is not in distress  Cardiovascular: Rate controlled, appreciate S1, S2   Respiratory: no wheezing, no rhonchi   Abdomen: (+) BS, pain at the nephrostomies   Extremities: No edema, palpable pulses   Neuro: No focal deficits   Data Reviewed: Basic Metabolic Panel:  Recent Labs Lab 05/28/15 0545 05/29/15 0557  NA 141 139  K 4.0 3.4*  CL 107 107  CO2 27 24  GLUCOSE 123* 100*  BUN 26* 28*  CREATININE 1.04 1.08  CALCIUM 8.8* 8.3*   Liver  Function Tests: No results for input(s): AST, ALT, ALKPHOS, BILITOT, PROT, ALBUMIN in the last 168 hours. No results for input(s): LIPASE, AMYLASE in the last 168 hours. No results for input(s): AMMONIA in the last 168 hours. CBC:  Recent Labs Lab 05/28/15 0545 05/29/15 0557  WBC 12.1* 14.1*  HGB 11.9* 12.1*  HCT 37.2* 38.4*  MCV 89.6 89.7  PLT 235 216   Cardiac Enzymes: No results for input(s): CKTOTAL, CKMB, CKMBINDEX, TROPONINI in the last 168 hours. BNP: Invalid input(s): POCBNP CBG: No results for input(s): GLUCAP in the last 168 hours.  No results found for this or any previous visit (from the past 240 hour(s)).   Scheduled Meds: . cefTRIAXone (ROCEPHIN)  IV  1 g Intravenous Q24H  . feeding supplement  1 Container Oral TID BM  . methylPREDNISolone (SOLU-MEDROL) injection  20 mg Intravenous Daily  . metoprolol  5 mg Intravenous Q12H  . oxyCODONE  15 mg Oral Q12H  . pantoprazole (PROTONIX) IV  40 mg Intravenous Q12H  . phenazopyridine  100 mg Oral TID WC   Continuous Infusions: . sodium chloride 50 mL/hr at 05/28/15 0043

## 2015-05-30 NOTE — Progress Notes (Signed)
Patient ID: Craig Heyer., male   DOB: 1930/08/14, 82 y.o.   MRN: [id number] [facility] PROGRESS NOTE  Craig Guiles. [id number] DOB: [birth year] DOA: 05/18/2015 PCP: [name], MD  Brief narrative:    82 y.o. male. Hx BPH, s/p radical prostatectomy. Hx A fib. DVT, s/p IVC filter, unable to take [facility] due to hematuria, stage 4, invasive urothelial bladder cancer, TURBT resection 02/28/2015 (high grade, poor prognosis per onc at [place]), s/p nephrostomy and ureteral tubes, recent admission 1/12 - 05/11/15 with UTI. bil percutaneous nephrostomy tubes 05/08/15, Rad tx by Dr Tammi Klippel. Pt was found to have gastric outlet obstruction on this admission.   Patient had a stent placed 05/25/2015.  Assessment/Plan:    Principal Problem:  Gastric outlet obstruction in the second portion of duodenum with retroperitoneal infiltration  - Based on CT scan findings, gastric outlet obstruction with distended fluid filled stomach to the level of the second portion of the duodenum with of retroperitoneal infiltration or scarring which may be causing gastric outlet obstruction - EGD done 1/26 demonstrated Class D esophagitis, retained solid and liquid gastric contents and long segment of stenosis/stricture in D2-D3 from extrinsic compression - Upper GI series demonstrated about 8 cm long segment of narrowing of the second and third portion of the duodenum with the most severe area in the third portion over 3.5 cm segment. - Stent placed 05/25/2015  - Continue full liquids  - Continue pain management efforts   Active Problems:  Urothelial carcinoma ([place]), stage IV / Bladder wall thickening  - Per oncology, no role for chemo at this time, palliative and hospice recommended. I spoke with Dr. Alen Blew of oncology who will have his office arrange f/u outpt appt  - Palliative care on board     Ureteral obstruction - Status post bilateral nephrostomy tubes placement 05/08/2015 by  urology    UTI - Started empiric Rocephin 2/3 - F/U urine culture results    Leukocytosis - Related to steroids  - WBC count 14.1   Severe protein calorie malnutrition - In the context of chronic illness - Full liquid diet per GI recommendations   Hypertensive urgency / essential hypertension - Continue metoprolol 5 mg IV every 12 hours.   History of recent DVT - Patient is status post IVC filter placement - Patient is not anticoagulated secondary to risk of bleed.    Atrial Fibrillation - CHA2DS2-VASc Score 3 - Not on AC due to risk of bleed - Continue metoprolol IV 5 mg Q 12 hours  - BP 135/66    Anemia of chronic disease - Secondary to combination of chronic kidney disease and malignancy - Hemoglobin stable at 12.1   Chronic kidney disease stage 3  - Baseline Cr is 1.66 - Creatinine WNL    Rheumatoid arthritis - Continue Solu-Medrol while in hospital   DVT Prophylaxis  - SCDs bilaterally   Code Status: DNR/DNI Family Communication:  Family not at the bedside this am; spoke with daughter over the phone daily  Disposition Plan: Home once pain better controlled  IV access:  Peripheral IV  Procedures and diagnostic studies:     Stent placement 05/25/2015 - Duodenal obstruction at level of D2, D3. This was treated with placement of 12cm long uncovered metal mesh stent.  Ct Abdomen Pelvis W Contrast 05/18/2015  There appears to be gastric outlet obstruction with distended fluid-filled stomach to the level of the second portion the duodenum. There is retroperitoneal infiltration or scarring which may be causing  gastric outlet obstruction. Duodenal mass or inflammation not excluded. Diffuse retroperitoneal inflammation or scarring most prominent on the right extending down to the pelvis. Bilateral percutaneous nephrostomy tubes and left ureteral tube. Diffuse asymmetric bladder wall thickening, likely neoplastic. Loss of distinction between the anterior bladder  wall and anterior abdominal wall suggests direct infiltration versus scarring. Metastatic lesion in the right lobe of the liver appears unchanged since prior study.  Dg Ugi W/high Density W/kub 05/21/2015   7.5-8 cm long segment of narrowing of the second and third portions of the duodenum. The most severe area of narrowing is in the third portion of the duodenum over a 3.5-4 cm segment.   Medical Consultants:  Gastroenterology PCT Surgery over the phone Oncology Dr. Lindi Adie  IR  Other Consultants:  None  IAnti-Infectives:   Rocephin 05/28/2015 -->    Leisa Lenz, MD  [facility] Pager [phone]  Time spent in minutes: 15 minutes  If 7PM-7AM, please contact night-coverage [email or web address] Password [email or web address] 05/30/2015, 12:05 PM   LOS: 12 days    HPI/Subjective: No acute overnight events. Pain maybe little better.   Objective: Filed Vitals:   05/29/15 1401 05/29/15 2033 05/29/15 2108 05/30/15 0401  BP: 110/60 144/68 148/89 135/66  Pulse: 90 90 82 82  Temp: 98.2 F (36.8 C) 98 F (36.7 C) 97.7 F (36.5 C) 98.1 F (36.7 C)  TempSrc: Oral Oral Oral Oral  Resp: 14 12 14 18   Height:      Weight:      SpO2: 95% 95% 97% 97%    Intake/Output Summary (Last 24 hours) at 05/30/15 1205 Last data filed at 05/30/15 0855  Gross per 24 hour  Intake 1891.67 ml  Output   1051 ml  Net 840.67 ml    Exam:   General:  Pt is alert and awake  Cardiovascular: RRR, (+) S1, S2  Respiratory: bilateral air entry, no wheezing   Abdomen: bilateral nephrostomy (+), no abd tenderness   Extremities: No swelling, palpable pulses   Neuro: Nonfocal   Data Reviewed: Basic Metabolic Panel:  Recent Labs Lab 05/28/15 0545 05/29/15 0557  NA 141 139  K 4.0 3.4*  CL 107 107  CO2 27 24  GLUCOSE 123* 100*  BUN 26* 28*  CREATININE 1.04 1.08  CALCIUM 8.8* 8.3*   Liver Function Tests: No results for input(s): AST, ALT, ALKPHOS, BILITOT, PROT, ALBUMIN in the last 168 hours. No  results for input(s): LIPASE, AMYLASE in the last 168 hours. No results for input(s): AMMONIA in the last 168 hours. CBC:  Recent Labs Lab 05/28/15 0545 05/29/15 0557  WBC 12.1* 14.1*  HGB 11.9* 12.1*  HCT 37.2* 38.4*  MCV 89.6 89.7  PLT 235 216   Cardiac Enzymes: No results for input(s): CKTOTAL, CKMB, CKMBINDEX, TROPONINI in the last 168 hours. BNP: Invalid input(s): POCBNP CBG: No results for input(s): GLUCAP in the last 168 hours.  Recent Results (from the past 240 hour(s))  Culture, Urine     Status: None (Preliminary result)   Collection Time: 05/27/15  5:06 PM  Result Value Ref Range Status   Specimen Description URINE, CLEAN CATCH  Final   Special Requests NONE  Final   Culture   Final    TOO YOUNG TO READ Performed at [facility]    Report Status PENDING  Incomplete     Scheduled Meds: . cefTRIAXone (ROCEPHIN)  IV  1 g Intravenous Q24H  . feeding supplement  1 Container Oral TID BM  . methylPREDNISolone (  SOLU-MEDROL) injection  20 mg Intravenous Daily  . metoprolol  5 mg Intravenous Q12H  . oxyCODONE  15 mg Oral Q12H  . pantoprazole (PROTONIX) IV  40 mg Intravenous Q12H  . phenazopyridine  100 mg Oral TID WC   Continuous Infusions: . sodium chloride 50 mL/hr at 05/30/15 1143

## 2015-05-31 ENCOUNTER — Ambulatory Visit
Admit: 2015-05-31 | Discharge: 2015-05-31 | Disposition: A | Discharge status: Home / Self Care | Payer: Medicare Other | Attending: Radiation Oncology | Admitting: Radiation Oncology

## 2015-05-31 ENCOUNTER — Ambulatory Visit: Payer: Medicare Other

## 2015-05-31 DIAGNOSIS — R52 Pain, unspecified: Secondary | ICD-10-CM

## 2015-05-31 LAB — URINE CULTURE

## 2015-05-31 LAB — GLUCOSE, CAPILLARY: GLUCOSE-CAPILLARY: 93 mg/dL (ref 65–99)

## 2015-05-31 MED ORDER — OXYCODONE HCL ER 15 MG PO T12A
30.0000 mg | EXTENDED_RELEASE_TABLET | Freq: Two times a day (BID) | ORAL | Status: DC
  Administered 2015-05-31 – 2015-06-02 (×4): 30 mg via ORAL
  Filled 2015-05-31 (×4): qty 2

## 2015-05-31 MED ORDER — OXYCODONE HCL 5 MG PO TABS
10.0000 mg | ORAL_TABLET | ORAL | Status: DC | PRN
  Administered 2015-05-31 (×2): 10 mg via ORAL
  Filled 2015-05-31 (×2): qty 2

## 2015-05-31 MED ORDER — SENNOSIDES-DOCUSATE SODIUM 8.6-50 MG PO TABS: 2.0000 | ORAL_TABLET | Freq: Every day | ORAL | Status: DC

## 2015-05-31 MED ORDER — OXYCODONE HCL ER 15 MG PO T12A
15.0000 mg | EXTENDED_RELEASE_TABLET | Freq: Two times a day (BID) | ORAL | Status: AC
  Administered 2015-05-31: 15 mg via ORAL
  Filled 2015-05-31: qty 1

## 2015-05-31 MED ORDER — SORBITOL 70 % SOLN
30.0000 mL | Freq: Every day | Status: DC | PRN
  Filled 2015-05-31: qty 30

## 2015-05-31 MED ORDER — OXYCODONE HCL 5 MG PO TABS
5.0000 mg | ORAL_TABLET | ORAL | Status: DC | PRN
  Administered 2015-06-01: 5 mg via ORAL
  Administered 2015-06-01: 10 mg via ORAL
  Administered 2015-06-01 – 2015-06-02 (×2): 5 mg via ORAL
  Administered 2015-06-02 (×2): 10 mg via ORAL
  Filled 2015-05-31 (×3): qty 1
  Filled 2015-05-31 (×3): qty 2
  Filled 2015-05-31: qty 1

## 2015-05-31 NOTE — Progress Notes (Signed)
Patient ID: Craig Lindskog., male   DOB: [birth year], 82 y.o.   MRN: [id number] Asked to examine patient's bilateral nephrostomy tube sites due to discomfort. Pt underwent bilateral nephrostograms by Dr. Laurence Ferrari on 05/27/15 which revealed well positioned and normally functioning tubes. The retention sutures were removed from both nephrostomy insertion sites and StatLock adhesive devices were applied. On questioning patient today he says most of discomfort in his back is from lying on StatLock device. Most of the burning sensation he has now is with voiding.  Both percutaneous nephrostomies tubes were examined.  A new StatLock device was applied over the right nephrostomy. Tubes appear to be draining well and insertion sites are without obvious leaking. Creat nl; recommend reinforcement of bulky gauze dressings over both StatLock devices. Monitor urine cx/sens.

## 2015-05-31 NOTE — Progress Notes (Signed)
Nutrition Follow-up  DOCUMENTATION CODES:   Non-severe (moderate) malnutrition in context of acute illness/injury  INTERVENTION:  - Will d/c Boost Breeze - Will order Magic Cup BID with meals, each supplement provides 290 kcal and 9 grams of protein - RD will continue to monitor for needs  NUTRITION DIAGNOSIS:   Increased nutrient needs related to catabolic illness, cancer and cancer related treatments as evidenced by estimated needs. -ongoing  GOAL:   Patient will meet greater than or equal to 90% of their needs -unmet  MONITOR:   PO intake, Supplement acceptance, Diet advancement, Weight trends, Labs, Skin, I & O's  ASSESSMENT:   82 y.o. male with h/o stage 4 bladder cancer, mets to liver, abdomen. Patient presents to the ED with 3 day history of moderate abdominal pain, N/V, and dry heaving. Unable to tolerate anything PO. Essentially no BM at all during this period. This has been worsening. He has tried nothing for symptoms.  2/6 No intakes documented since previous assessment. Pt sleeping at this time with no family/visitors present. Spoke with pt concerning pt and she states that he did not want anything for breakfast when offered earlier today. She also states that pt does not like [employer] and has been refusing this item. Pt has been eating yogurt and jello mainly, per RN. Discussed with her about adding Magic Cup and monitoring for tolerance/acceptance of this item.   Pt not meeting needs. No new weight since 05/19/15. Medications reviewed. Labs reviewed; K: 3.4 mmol/L, BUN: 28 mg/dL, Ca: 8.3 mg/dL.   2/1 - New consult received: has a duodenal stent. Needs to be on FLD/pureed diet. - Per chart review, pt consumed 50% of CLD yesterday for dinner and was NPO for breakfast and lunch yesterday for EGD with transendoscopic stent placement.  - Pt reports that for breakfast this AM he had coffee, [place] ice, and jello.  - He denies any nausea and states abdominal  soreness which is not exacerbated by PO intakes.  - Pt's diet advanced to FLD following breakfast.  - Pt states that surgeon informed him that he will be on Soft diet at d/c.  - Talked with pt about the need to have very soft items, such as items that require minimal to no chewing, and that are free of all lunch and chunks.  - Provided him with handouts outlining appropriate foods from each food group as well as inappropriate foods from each group.  - Re-iterated several times the importance of very soft foods that are free of lumps and chunks   1/30 - Pt has been NPO since previous assessment until advancement to CLD this AM at 0903.  - Pt states that he drank a Coke and "felt wonderful" after this; no other intakes since advancement.  - Pt states ongoing severe abdominal pain.  - Pt requested pain medication during visit and RD alerted RN to request.  - Pt has NGT in place with 300cc output this shift.  - Per chart and round this AM, pt to have EGD tomorrow (1/31) and possible stent placement.   1/25 - Pt has been NPO since admission.  - Several attempts to place NGT at bedside have been unsuccessful.  - Talked with pt as he was lying in bed and no one was at bedside.  - He states that he is miserable and is hoping to receive relief soon from abdominal discomfort.  - He states that he last ate yesterday (1/2 hamburger) and that this did not  cause any increase in nausea or in feelings of abdominal pressure.  - Pt states that abdominal pressure has been ongoing for several days and has caused associated nausea.  - He has attempted to vomit to relieve this but has been unsuccessful. - Saw RN note from this AM at [phone]. Will continue to monitor for GOC and plan during hospitalization. - Pt reports UBW of 205 lbs and that he last weighed this 3 weeks ago.  - Per chart review, pt has lost 3 lbs (1.5% body weight) in the past 1 week which is significant for time frame.  - Mild muscle  and fat wasting noted to upper body; unable to assess lower body.   Diet Order:  Diet full liquid Room service appropriate?: Yes; Fluid consistency:: Thin  Skin:  Reviewed, no issues  Last BM:  1/29  Height:   Ht Readings from Last 1 Encounters:  05/19/15 5\' 11"  (1.803 m)    Weight:   Wt Readings from Last 1 Encounters:  05/19/15 202 lb 6.1 oz (91.8 kg)    Ideal Body Weight:  78.18 kg (kg)  BMI:  Body mass index is 28.24 kg/(m^2).  Estimated Nutritional Needs:   Kcal:  2100-2300  Protein:  85-100 grams  Fluid:  2-2.3 L/day  EDUCATION NEEDS:   No education needs identified at this time     Jarome Matin, RD, LDN Inpatient Clinical Dietitian Pager # [phone] After hours/weekend pager # [phone]

## 2015-05-31 NOTE — Progress Notes (Signed)
Pt daughter stated that she is concerned that pt legs and abdomin look more swollen than the day prior.  MD made aware, will continue to monitor closely.

## 2015-05-31 NOTE — Care Management Note (Signed)
Case Management Note  Patient Details  Name: [name]. MRN: [id number] Date of Birth: 1930-12-10  Subjective/Objective:   [facility] rep Lattie Haw states they will see patient @ home on d/c date, & all dme has been delivered to patients home. No further CM needs.                 Action/Plan:d/c home w/home hospice services.   Expected Discharge Date:                  Expected Discharge Plan:  Home w Hospice Care  In-House Referral:     Discharge planning Services  CM Consult  Post Acute Care Choice:  Home Health (Active [facility]-HHRN/PT) Choice offered to:     DME Arranged:    DME Agency:     HH Arranged:  RN [place] Agency:  [facility]  Status of Service:  In process, will continue to follow  Medicare Important Message Given:  Yes Date Medicare IM Given:    Medicare IM give by:    Date Additional Medicare IM Given:    Additional Medicare Important Message give by:     If discussed at [place] of Stay Meetings, dates discussed:    Additional Comments:  Dessa Phi, RN 05/31/2015, 11:52 AM

## 2015-05-31 NOTE — Progress Notes (Addendum)
Daily Progress Note   Patient Name: Craig Waters.       Date: 05/31/2015 DOB: Dec 05, 1930  Age: 82 y.o. MRN#: [id number] Attending Physician: Robbie Lis, MD Primary Care Physician: [name], MD Admit Date: 05/18/2015  Reason for Consultation/Follow-up: Establishing goals of care and Psychosocial/spiritual support, symptom recommendations as indicated  Subjective:  I met today with Craig Waters. He is lethargic but speaks to me when I address him. He also says that he is in pain - around his nephrostomy tubes. He is unable to describe pain further. RN says that he gets little relief except from IV Dilaudid 2 mg today. I have calculated past 24 hours of prn pain medication and will increase long acting pain medication to better control pain. He wants to go home.  I spoke with daughter, Craig Waters, who confirms increasing pain issues over the weekend. I discussed plan to increase OxyCONTIN and hopeful for home with hospice once pain better controlled (hopeful for tomorrow). Craig Waters confirms goal for pain control and to get him home with hospice as soon as possible.   Updated RN and Dr. Charlies Silvers.    Length of Stay: 13 days  Current Medications: Scheduled Meds:  . cefTRIAXone (ROCEPHIN)  IV  1 g Intravenous Q24H  . methylPREDNISolone (SOLU-MEDROL) injection  20 mg Intravenous Daily  . metoprolol  5 mg Intravenous Q12H  . oxyCODONE  15 mg Oral Q12H  . pantoprazole (PROTONIX) IV  40 mg Intravenous Q12H  . phenazopyridine  100 mg Oral TID WC    Continuous Infusions: . sodium chloride 50 mL/hr at 05/31/15 0601    PRN Meds: docusate sodium, hydrALAZINE, HYDROmorphone (DILAUDID) injection, ondansetron (ZOFRAN) IV, oxyCODONE, promethazine  Physical Exam: Physical Exam   Constitutional: He is oriented to person, place, and time. He appears well-developed. He appears lethargic. He appears ill.  HENT:  Head: Normocephalic and atraumatic.  Mouth/Throat: Mucous membranes are dry.  Cardiovascular: Normal rate and regular rhythm.   Pulmonary/Chest: Effort normal. No accessory muscle usage. No tachypnea. No respiratory distress.  Abdominal: Soft.  Bilateral nephrostomy tubes  Musculoskeletal:       Right shoulder: He exhibits decreased strength.  Neurological: He is oriented to person, place, and time. He appears lethargic.  Skin: Skin is warm and dry.  Vital Signs: BP 145/84 mmHg  Pulse 100  Temp(Src) 98.2 F (36.8 C) (Oral)  Resp 18  Ht _0  (1.803 m)  Wt 91.8 kg (202 lb 6.1 oz)  BMI 28.24 kg/m2  SpO2 99% SpO2: SpO2: 99 % O2 Device: O2 Device: Not Delivered O2 Flow Rate: O2 Flow Rate (L/min): 4 L/min  Intake/output summary:   Intake/Output Summary (Last 24 hours) at 05/31/15 1220 Last data filed at 05/31/15 1020  Gross per 24 hour  Intake  752.5 ml  Output   1590 ml  Net -837.5 ml   LBM: Last BM Date: 05/23/15 Baseline Weight: Weight: 92.987 kg (205 lb) Most recent weight: Weight: 91.8 kg (202 lb 6.1 oz)       Palliative Assessment/Data: Flowsheet Rows        Most Recent Value   Intake Tab    Referral Department  Hospitalist   Unit at Time of Referral  Cardiac/Telemetry Unit   Palliative Care Primary Diagnosis  Cancer   Date Notified  05/19/15   Palliative Care Type  New Palliative care   Reason for referral  Clarify Goals of Care   Date of Admission  05/18/15   Date first seen by Palliative Care  05/20/15   # of days Palliative referral response time  1 Day(s)   # of days IP prior to Palliative referral  1   Clinical Assessment    Psychosocial & Spiritual Assessment    Palliative Care Outcomes       Additional Data Reviewed: CBC    Component Value Date/Time   WBC 14.1* 05/29/2015 0557   RBC 4.28  05/29/2015 0557   HGB 12.1* 05/29/2015 0557   HCT 38.4* 05/29/2015 0557   PLT 216 05/29/2015 0557   MCV 89.7 05/29/2015 0557   MCH 28.3 05/29/2015 0557   MCHC 31.5 05/29/2015 0557   RDW 15.3 05/29/2015 0557   LYMPHSABS 1.3 03/14/2015 1220   MONOABS 0.9 03/14/2015 1220   EOSABS 0.1 03/14/2015 1220   BASOSABS 0.0 03/14/2015 1220    CMP     Component Value Date/Time   NA 139 05/29/2015 0557   K 3.4* 05/29/2015 0557   CL 107 05/29/2015 0557   CO2 24 05/29/2015 0557   GLUCOSE 100* 05/29/2015 0557   BUN 28* 05/29/2015 0557   CREATININE 1.08 05/29/2015 0557   CALCIUM 8.3* 05/29/2015 0557   PROT 6.6 05/18/2015 2140   ALBUMIN 3.1* 05/18/2015 2140   AST 21 05/18/2015 2140   ALT 19 05/18/2015 2140   ALKPHOS 98 05/18/2015 2140   BILITOT 0.6 05/18/2015 2140   GFRNONAA >60 05/29/2015 0557   GFRAA >60 05/29/2015 0557       Problem List:  Patient Active Problem List   Diagnosis Date Noted  . Pain, cancer   . DNR (do not resuscitate)   . Palliative care encounter   . Nausea with vomiting   . Hypertensive urgency 05/21/2015  . CKD (chronic kidney disease) stage 3, GFR 30-59 ml/min 05/21/2015  . History of DVT (deep vein thrombosis) 05/21/2015  . Leukocytosis 05/21/2015  . Duodenal stricture   . Abnormal CT scan, small bowel   . Malnutrition of moderate degree 05/19/2015  . Gastric outlet obstruction 05/18/2015  . Atrial fibrillation with RVR ([place]) 03/14/2015  . Urothelial carcinoma ([place]) 02/15/2015  . Rheumatoid arthritis ([place]) 02/15/2015     Palliative Care Assessment & Plan    1.Code Status:   DNR       Code Status Orders  Start     Ordered   05/19/15 0002  Do not attempt resuscitation (DNR)   Continuous    Question Answer Comment  In the event of cardiac or respiratory ARREST Do not call a "code blue"   In the event of cardiac or respiratory ARREST Do not perform Intubation, CPR, defibrillation or ACLS   In the event of cardiac or respiratory  ARREST Use medication by any route, position, wound care, and other measures to relive pain and suffering. May use oxygen, suction and manual treatment of airway obstruction as needed for comfort.      05/19/15 0002    Code Status History    Date Active Date Inactive Code Status Order ID Comments User Context   05/07/2015  2:31 AM 05/11/2015  3:15 PM Full Code [id number]  Ivor Costa, MD ED   04/05/2015  3:59 AM 04/09/2015  2:58 PM Full Code [id number]  Oswald Hillock, MD Inpatient   03/14/2015  4:26 PM 03/20/2015  6:52 PM Full Code [id number]  Elmarie Shiley, MD Inpatient   02/15/2015  8:15 PM 02/22/2015 10:17 PM Full Code [id number]  Venetia Maxon Rama, MD Inpatient   05/15/2014 12:30 AM 05/15/2014  8:16 PM Full Code [id number]  Allyne Gee, MD Inpatient   07/04/2012  8:45 PM 07/08/2012  4:33 PM Full Code [id number]  Derrill Kay, MD ED   11/08/2011 11:45 PM 11/13/2011  5:35 AM Full Code [id number]  Lenis Noon, RN Inpatient    Advance Directive Documentation        Most Recent Value   Type of Advance Directive  Healthcare Power of Attorney   Pre-existing out of facility DNR order (yellow form or pink MOST form)     "MOST" Form in Place?         Desire for further Chaplaincy support:no  Psycho-social Needs: Education on Hospice  3. Symptom Management:      1. Covert IV medication to oral agents in anticipation for discharge home                Agree with increase of Oxycodone 5-10 mg po every 4 hrs prn Increase: OxyContin ER 15 mg po every 12 hrs will increase to OxyContin ER 30 mg every 12 hrs (based on prn usage of total equivalent Oxy 105 mg over 24 hrs and decreasing dosage ~50% for cross tolerance)  Use IV dilaudid 1-2 mg every 2 hours only after oral agents have been exhausted.        2. Constipation: LBM 1/29. Added Senokot-S 2 tablets qhs. Sorbitol prn.   4. Palliative Prophylaxis:   Aspiration, Bowel Regimen, Frequent Pain Assessment and Oral Care   6. Discharge  Planning:  Home with hospice services   Thank you for allowing the Palliative Medicine Team to assist in the care of this patient.   Time In:  1200 Time Out: 1240 Total Time 40 min Prolonged Time Billed  no         Pershing Proud, NP  0/08/3974, 12:20 PM  Please contact Palliative Medicine Team phone at [phone] for questions and concerns.

## 2015-05-31 NOTE — Care Management Important Message (Signed)
Important Message  Patient Details  Name: Craig Waters. MRN: [id number] Date of Birth: 03-Sep-1930   Medicare Important Message Given:  Yes    Camillo Flaming 05/31/2015, 11:05 AMImportant Message  Patient Details  Name: Craig Waters. MRN: [id number] Date of Birth: July 28, 1930   Medicare Important Message Given:  Yes    Camillo Flaming 05/31/2015, 11:05 AM

## 2015-05-31 NOTE — Progress Notes (Signed)
Patient refusing radiation treatment per Joellen Jersey, RN on [place], RT on L2 of this finding.

## 2015-05-31 NOTE — Progress Notes (Signed)
PT Cancellation Note  Patient Details Name: Craig Waters. MRN: [id number] DOB: 11-Aug-1930   Cancelled Treatment:     Pt OOB in recliner by nursing but was not up for anything else, he stated.  Feels very bloated and weak.  Will check back another day.     Nathanial Rancher 05/31/2015, 12:11 PM

## 2015-05-31 NOTE — Progress Notes (Signed)
Patient ID: Drex Piersol., male   DOB: Feb 02, 1931, 82 y.o.   MRN: [id number] [facility] PROGRESS NOTE  [name] [id number] DOB: [birth year] DOA: 05/18/2015 PCP: [name], MD  Brief narrative:    82 y.o. male. Hx BPH, s/p radical prostatectomy. Hx A fib. DVT, s/p IVC filter, unable to take [facility] due to hematuria, stage 4, invasive urothelial bladder cancer, TURBT resection 02/28/2015 (high grade, poor prognosis per onc at [place]), s/p nephrostomy and ureteral tubes, recent admission 1/12 - 05/11/15 with UTI. bil percutaneous nephrostomy tubes 05/08/15, Rad tx by Dr Tammi Klippel. Pt was found to have gastric outlet obstruction on this admission.   Patient had a stent placed 05/25/2015.  Assessment/Plan:    Principal Problem:  Gastric outlet obstruction in the second portion of duodenum with retroperitoneal infiltration  - Based on CT scan findings, gastric outlet obstruction with distended fluid filled stomach to the level of the second portion of the duodenum with of retroperitoneal infiltration or scarring which may be causing gastric outlet obstruction - EGD done 1/26 demonstrated Class D esophagitis, retained solid and liquid gastric contents and long segment of stenosis/stricture in D2-D3 from extrinsic compression - Upper GI series demonstrated about 8 cm long segment of narrowing of the second and third portion of the duodenum with the most severe area in the third portion over 3.5 cm segment. - Stent placed 05/25/2015  - Continue full liquids   Active Problems:  Urothelial carcinoma ([place]), stage IV / Bladder wall thickening  - Per oncology, no role for chemo at this time, palliative and hospice recommended. I spoke with Dr. Alen Blew of oncology who will have his office arrange f/u outpt appt  - Palliative care following     Ureteral obstruction - Status post bilateral nephrostomy tubes placement 05/08/2015 by urology - Will ask IR if something can be  done to make him more comfortable, maybe replace nephrostomies or pull them out    UTI - Started empiric Rocephin 2/3 - Urine culture pending    Leukocytosis - Related to steroids  - WBC count 14.1   Severe protein calorie malnutrition - In the context of chronic illness - Full liquid diet per GI recommendations   Hypertensive urgency / essential hypertension - Continue metoprolol 5 mg IV every 12 hours. - Blood pressure controlled    History of recent DVT - Patient is status post IVC filter placement - Patient is not anticoagulated secondary to risk of bleed.    Atrial Fibrillation - CHA2DS2-VASc Score 3 - Not on AC due to risk of bleed - Continue metoprolol IV 5 mg Q 12 hours     Anemia of chronic disease - Secondary to combination of chronic kidney disease and malignancy - Hemoglobin stable - No current indication for transfusion    Chronic kidney disease stage 3  - Baseline Cr is 1.66 - Creatinine is WNL    Rheumatoid arthritis - Continue Solu-Medrol while in hospital   DVT Prophylaxis  - SCDs bilaterally in hospital   Code Status: DNR/DNI Family Communication:  Family not at the bedside this am; spoke with daughter over the phone daily  Disposition Plan: Home once pain better controlled, possibly by 06/01/2015  IV access:  Peripheral IV  Procedures and diagnostic studies:     Stent placement 05/25/2015 - Duodenal obstruction at level of D2, D3. This was treated with placement of 12cm long uncovered metal mesh stent.  Ct Abdomen Pelvis W Contrast 05/18/2015  There appears to be  gastric outlet obstruction with distended fluid-filled stomach to the level of the second portion the duodenum. There is retroperitoneal infiltration or scarring which may be causing gastric outlet obstruction. Duodenal mass or inflammation not excluded. Diffuse retroperitoneal inflammation or scarring most prominent on the right extending down to the pelvis. Bilateral  percutaneous nephrostomy tubes and left ureteral tube. Diffuse asymmetric bladder wall thickening, likely neoplastic. Loss of distinction between the anterior bladder wall and anterior abdominal wall suggests direct infiltration versus scarring. Metastatic lesion in the right lobe of the liver appears unchanged since prior study.  Dg Ugi W/high Density W/kub 05/21/2015   7.5-8 cm long segment of narrowing of the second and third portions of the duodenum. The most severe area of narrowing is in the third portion of the duodenum over a 3.5-4 cm segment.   Medical Consultants:  Gastroenterology PCT Surgery over the phone Oncology Dr. Lindi Adie  IR  Other Consultants:  None  IAnti-Infectives:   Rocephin 05/28/2015 -->    Leisa Lenz, MD  [facility] Pager [phone]  Time spent in minutes: 15 minutes  If 7PM-7AM, please contact night-coverage [email or web address] Password [email or web address] 05/31/2015, 12:32 PM   LOS: 13 days    HPI/Subjective: No acute overnight events. Pain still there.   Objective: Filed Vitals:   05/30/15 1250 05/30/15 1343 05/30/15 2052 05/31/15 0421  BP: 167/79 131/58 157/80 145/84  Pulse: 83  85 100  Temp: 98.3 F (36.8 C)  97.7 F (36.5 C) 98.2 F (36.8 C)  TempSrc: Oral  Oral Oral  Resp: 16  18 18   Height:      Weight:      SpO2: 97%  95% 99%    Intake/Output Summary (Last 24 hours) at 05/31/15 1232 Last data filed at 05/31/15 1020  Gross per 24 hour  Intake  752.5 ml  Output   1590 ml  Net -837.5 ml    Exam:   General:  Pt is not in distress  Cardiovascular: Rate controlled, appreciate S1, S2   Respiratory: no rhonchi, no wheezing   Abdomen: bilateral nephrostomy (+), appreciate BS  Extremities: No edema, palpable pulses   Neuro: No focal deficits   Data Reviewed: Basic Metabolic Panel:  Recent Labs Lab 05/28/15 0545 05/29/15 0557  NA 141 139  K 4.0 3.4*  CL 107 107  CO2 27 24  GLUCOSE 123* 100*  BUN 26* 28*  CREATININE 1.04 1.08   CALCIUM 8.8* 8.3*   Liver Function Tests: No results for input(s): AST, ALT, ALKPHOS, BILITOT, PROT, ALBUMIN in the last 168 hours. No results for input(s): LIPASE, AMYLASE in the last 168 hours. No results for input(s): AMMONIA in the last 168 hours. CBC:  Recent Labs Lab 05/28/15 0545 05/29/15 0557  WBC 12.1* 14.1*  HGB 11.9* 12.1*  HCT 37.2* 38.4*  MCV 89.6 89.7  PLT 235 216   Cardiac Enzymes: No results for input(s): CKTOTAL, CKMB, CKMBINDEX, TROPONINI in the last 168 hours. BNP: Invalid input(s): POCBNP CBG: No results for input(s): GLUCAP in the last 168 hours.  Recent Results (from the past 240 hour(s))  Culture, Urine     Status: None (Preliminary result)   Collection Time: 05/27/15  5:06 PM  Result Value Ref Range Status   Specimen Description URINE, CLEAN CATCH  Final   Special Requests NONE  Final   Culture   Final    TOO YOUNG TO READ Performed at [facility]    Report Status PENDING  Incomplete     Scheduled  Meds: . cefTRIAXone (ROCEPHIN)  IV  1 g Intravenous Q24H  . methylPREDNISolone (SOLU-MEDROL) injection  20 mg Intravenous Daily  . metoprolol  5 mg Intravenous Q12H  . oxyCODONE  15 mg Oral Q12H  . pantoprazole (PROTONIX) IV  40 mg Intravenous Q12H  . phenazopyridine  100 mg Oral TID WC   Continuous Infusions: . sodium chloride 50 mL/hr at 05/31/15 0601

## 2015-06-01 ENCOUNTER — Inpatient Hospital Stay (HOSPITAL_COMMUNITY): Payer: Medicare Other

## 2015-06-01 ENCOUNTER — Ambulatory Visit
Admit: 2015-06-01 | Discharge: 2015-06-01 | Disposition: A | Discharge status: Home / Self Care | Payer: Medicare Other | Attending: Radiation Oncology | Admitting: Radiation Oncology

## 2015-06-01 ENCOUNTER — Encounter (HOSPITAL_COMMUNITY): Payer: Self-pay | Admitting: Internal Medicine

## 2015-06-01 ENCOUNTER — Telehealth: Payer: Self-pay | Admitting: Radiation Oncology

## 2015-06-01 ENCOUNTER — Ambulatory Visit: Payer: Medicare Other

## 2015-06-01 DIAGNOSIS — R140 Abdominal distension (gaseous): Secondary | ICD-10-CM | POA: Diagnosis not present

## 2015-06-01 DIAGNOSIS — Z515 Encounter for palliative care: Secondary | ICD-10-CM | POA: Insufficient documentation

## 2015-06-01 DIAGNOSIS — R52 Pain, unspecified: Secondary | ICD-10-CM | POA: Insufficient documentation

## 2015-06-01 DIAGNOSIS — D638 Anemia in other chronic diseases classified elsewhere: Secondary | ICD-10-CM | POA: Diagnosis present

## 2015-06-01 DIAGNOSIS — I480 Paroxysmal atrial fibrillation: Secondary | ICD-10-CM | POA: Diagnosis present

## 2015-06-01 DIAGNOSIS — E43 Unspecified severe protein-calorie malnutrition: Secondary | ICD-10-CM | POA: Diagnosis present

## 2015-06-01 MED ORDER — LACTULOSE 10 GM/15ML PO SOLN
30.0000 g | Freq: Once | ORAL | Status: AC
  Administered 2015-06-01: 30 g via ORAL
  Filled 2015-06-01: qty 45

## 2015-06-01 MED ORDER — SENNOSIDES-DOCUSATE SODIUM 8.6-50 MG PO TABS
2.0000 | ORAL_TABLET | Freq: Two times a day (BID) | ORAL | Status: DC
  Administered 2015-06-01 – 2015-06-02 (×3): 2 via ORAL
  Filled 2015-06-01 (×3): qty 2

## 2015-06-01 NOTE — Progress Notes (Addendum)
Patient ID: Craig Bunker., male   DOB: Aug 22, 1930, 82 y.o.   MRN: [id number] [facility] PROGRESS NOTE  Craig Guiles. [id number] DOB: 1930-12-01 DOA: 05/18/2015 PCP: Craig Argyle, MD  Brief narrative:    82 y.o. male. Hx BPH, s/p radical prostatectomy. Hx A fib. DVT, s/p IVC filter, unable to take [facility] due to hematuria, stage 4, invasive urothelial bladder cancer, TURBT resection 02/28/2015 (high grade, poor prognosis per onc at [place]), s/p nephrostomy and ureteral tubes, recent admission 1/12 - 05/11/15 with UTI. bil percutaneous nephrostomy tubes 05/08/15, Rad tx by Dr Tammi Klippel. Pt was found to have gastric outlet obstruction on this admission.   Patient had a stent placed 05/25/2015.  Barrier to discharge: Ongoing pain and now abdominal distention for which reason order placed for abdominal ultrasound for evaluation of ascites.  Assessment/Plan:    Principal Problem:  Gastric outlet obstruction in the second portion of duodenum with retroperitoneal infiltration  - Based on CT scan findings, gastric outlet obstruction with distended fluid filled stomach to the level of the second portion of the duodenum with of retroperitoneal infiltration or scarring which may be causing gastric outlet obstruction - EGD done 1/26 demonstrated Class D esophagitis, retained solid and liquid gastric contents and long segment of stenosis/stricture in D2-D3 from extrinsic compression - Upper GI series demonstrated about 8 cm long segment of narrowing of the second and third portion of the duodenum with the most severe area in the third portion over 3.5 cm segment. - Stent placed 05/25/2015  - Continue full liquids per GI recommendation  Active Problems:   Abdominal distention - Obtain abdominal ultrasound for evaluation of possible ascites   Urothelial carcinoma ([place]), stage IV / Bladder wall thickening  - Per oncology, no role for chemo at this time, palliative and hospice  recommended. I spoke with Dr. Alen Blew of oncology who will have his office arrange f/u outpt appt  - Patient has urothelial carcinoma with bladder wall thickening, can't determine specific location - Appreciate palliative care following and addressing goals of care and pain management    Ureteral obstruction - Status post bilateral nephrostomy tubes placement 05/08/2015 by urology - Appreciate interventional radiology help very much with adjusting the nephrostomy tubes. Patient seems to be more comfortable this morning.    UTI - Started empiric Rocephin 2/3 - Urine culture with multiple species none predominant - History of has burning sensation with urination so would continue empiric Rocephin for now at least through tomorrow.   Leukocytosis - Related to steroids  - WBC count 14.1   Severe protein calorie malnutrition - In the context of chronic illness - Full liquid diet per GI recommendations   Hypertensive urgency / essential hypertension - Continue metoprolol 5 mg IV every 12 hours.   History of recent DVT - Patient is status post IVC filter placement - Not a candidate for anticoagulation because of risk of bleeding   Atrial Fibrillation - CHA2DS2-VASc Score 3 - Not on AC due to risk of bleed - Continue metoprolol IV 5 mg Q 12 hours     Anemia of chronic disease - Secondary to combination of chronic kidney disease and malignancy - Hemoglobin stable - No current indication for transfusion    Chronic kidney disease stage 3  - Baseline Cr is 1.66 - Creatinine WNL    Rheumatoid arthritis - Continue Solu-Medrol while in hospital   DVT Prophylaxis  - SCDs bilaterally due to risk of bleeding  Code Status: DNR/DNI Family Communication:  Family not at the bedside this am; spoke with daughter over the phone daily  Disposition Plan: Home once pain better controlled, possibly by 06/02/2015  IV access:  Peripheral IV  Procedures and diagnostic studies:      Stent placement 05/25/2015 - Duodenal obstruction at level of D2, D3. This was treated with placement of 12cm long uncovered metal mesh stent.  Ct Abdomen Pelvis W Contrast 05/18/2015  There appears to be gastric outlet obstruction with distended fluid-filled stomach to the level of the second portion the duodenum. There is retroperitoneal infiltration or scarring which may be causing gastric outlet obstruction. Duodenal mass or inflammation not excluded. Diffuse retroperitoneal inflammation or scarring most prominent on the right extending down to the pelvis. Bilateral percutaneous nephrostomy tubes and left ureteral tube. Diffuse asymmetric bladder wall thickening, likely neoplastic. Loss of distinction between the anterior bladder wall and anterior abdominal wall suggests direct infiltration versus scarring. Metastatic lesion in the right lobe of the liver appears unchanged since prior study.  Dg Ugi W/high Density W/kub 05/21/2015   7.5-8 cm long segment of narrowing of the second and third portions of the duodenum. The most severe area of narrowing is in the third portion of the duodenum over a 3.5-4 cm segment.   Medical Consultants:  Gastroenterology PCT Surgery over the phone Oncology Craig Waters  IR  Other Consultants:  None  IAnti-Infectives:   Rocephin 05/28/2015 -->    Leisa Lenz, MD  [facility] Pager [phone]  Time spent in minutes: 25 minutes  If 7PM-7AM, please contact night-coverage [email or web address] Password [email or web address] 06/01/2015, 10:54 AM   LOS: 14 days    HPI/Subjective: No acute overnight events. Complains of worsening abdominal swelling. Pain better controlled this morning. He seems to be in better spirits today.  Objective: Filed Vitals:   05/31/15 0421 05/31/15 1318 05/31/15 2153 06/01/15 0451  BP: 145/84 118/55 154/78 162/72  Pulse: 100 73 92 87  Temp: 98.2 F (36.8 C) 97.4 F (36.3 C) 97.5 F (36.4 C) 97.9 F (36.6 C)  TempSrc: Oral Oral Oral Oral   Resp: 18 18 16 18   Height:      Weight:      SpO2: 99% 97% 95% 95%    Intake/Output Summary (Last 24 hours) at 06/01/15 1054 Last data filed at 06/01/15 0641  Gross per 24 hour  Intake 2619.99 ml  Output   1025 ml  Net 1594.99 ml    Exam:   General:  Pt is alert, awake  Cardiovascular: RRR, appreciate S1-S2  Respiratory: Bilateral air entry, no wheezing  Abdomen: bilateral nephrostomy (+), appreciate BS, abdomen is distended  Extremities: No swelling, appreciate pulses bilaterally  Neuro: Nonfocal  Data Reviewed: Basic Metabolic Panel:  Recent Labs Lab 05/28/15 0545 05/29/15 0557  NA 141 139  K 4.0 3.4*  CL 107 107  CO2 27 24  GLUCOSE 123* 100*  BUN 26* 28*  CREATININE 1.04 1.08  CALCIUM 8.8* 8.3*   Liver Function Tests: No results for input(s): AST, ALT, ALKPHOS, BILITOT, PROT, ALBUMIN in the last 168 hours. No results for input(s): LIPASE, AMYLASE in the last 168 hours. No results for input(s): AMMONIA in the last 168 hours. CBC:  Recent Labs Lab 05/28/15 0545 05/29/15 0557  WBC 12.1* 14.1*  HGB 11.9* 12.1*  HCT 37.2* 38.4*  MCV 89.6 89.7  PLT 235 216   Cardiac Enzymes: No results for input(s): CKTOTAL, CKMB, CKMBINDEX, TROPONINI in the last 168 hours. BNP: Invalid input(s): POCBNP CBG:  Recent Labs  Lab 05/31/15 0718  GLUCAP 93    Recent Results (from the past 240 hour(s))  Culture, Urine     Status: None   Collection Time: 05/27/15  5:06 PM  Result Value Ref Range Status   Specimen Description URINE, CLEAN CATCH  Final   Special Requests NONE  Final   Culture   Final    MULTIPLE SPECIES PRESENT, SUGGEST RECOLLECTION Performed at [facility]    Report Status 05/31/2015 FINAL  Final     Scheduled Meds: . cefTRIAXone (ROCEPHIN)  IV  1 g Intravenous Q24H  . lactulose  30 g Oral Once  . methylPREDNISolone (SOLU-MEDROL) injection  20 mg Intravenous Daily  . metoprolol  5 mg Intravenous Q12H  . oxyCODONE  30 mg Oral Q12H   . pantoprazole (PROTONIX) IV  40 mg Intravenous Q12H  . senna-docusate  2 tablet Oral BID   Continuous Infusions:

## 2015-06-01 NOTE — Progress Notes (Signed)
Daily Progress Note   Patient Name: Craig Waters.       Date: 06/01/2015 DOB: 12-16-1930  Age: 82 y.o. MRN#: [id number] Attending Physician: Robbie Lis, MD Primary Care Physician: [name], MD Admit Date: 05/18/2015  Reason for Consultation/Follow-up: Establishing goals of care and Psychosocial/spiritual support, symptom recommendations as indicated  Subjective:  Mr. Bingham is more alert today and seems in better spirits. Still complaining of pain - today in his lower abd 4/10. He says that the change in pain medication has helped his pain overall. He also complaints of having some gas - discussed giving laxative to prevent further obstruction and he agrees to try. His only request is to get him home ASAP and out of the hospital. Emotional support provided.  Spoke with daughter, [name], who is also concerned about his increased abd swelling. Updated on plan.    Length of Stay: 14 days  Current Medications: Scheduled Meds:  . cefTRIAXone (ROCEPHIN)  IV  1 g Intravenous Q24H  . methylPREDNISolone (SOLU-MEDROL) injection  20 mg Intravenous Daily  . metoprolol  5 mg Intravenous Q12H  . oxyCODONE  30 mg Oral Q12H  . pantoprazole (PROTONIX) IV  40 mg Intravenous Q12H  . senna-docusate  2 tablet Oral BID    Continuous Infusions:    PRN Meds: hydrALAZINE, HYDROmorphone (DILAUDID) injection, ondansetron (ZOFRAN) IV, oxyCODONE, promethazine, sorbitol  Physical Exam: Physical Exam  Constitutional: He is oriented to person, place, and time. He appears well-developed. He appears lethargic. He appears ill.  HENT:  Head: Normocephalic and atraumatic.  Mouth/Throat: Mucous membranes are dry.  Cardiovascular: Normal rate and regular rhythm.   Pulmonary/Chest: Effort  normal. No accessory muscle usage. No tachypnea. No respiratory distress.  Abdominal: Soft.  Bilateral nephrostomy tubes  Musculoskeletal:       Right shoulder: He exhibits decreased strength.  Neurological: He is oriented to person, place, and time. He appears lethargic.  Skin: Skin is warm and dry.                Vital Signs: BP 138/80 mmHg  Pulse 95  Temp(Src) 98 F (36.7 C) (Oral)  Resp 20  Ht 5\' 11"  (1.803 m)  Wt 91.8 kg (202 lb 6.1 oz)  BMI 28.24 kg/m2  SpO2 97% SpO2: SpO2: 97 % O2 Device: O2 Device:  Not Delivered O2 Flow Rate: O2 Flow Rate (L/min): 4 L/min  Intake/output summary:   Intake/Output Summary (Last 24 hours) at 06/01/15 1345 Last data filed at 06/01/15 1110  Gross per 24 hour  Intake 2419.99 ml  Output   1225 ml  Net 1194.99 ml   LBM: Last BM Date: 05/23/15 Baseline Weight: Weight: 92.987 kg (205 lb) Most recent weight: Weight: 91.8 kg (202 lb 6.1 oz)       Palliative Assessment/Data: Flowsheet Rows        Most Recent Value   Intake Tab    Referral Department  Hospitalist   Unit at Time of Referral  Cardiac/Telemetry Unit   Palliative Care Primary Diagnosis  Cancer   Date Notified  05/19/15   Palliative Care Type  New Palliative care   Reason for referral  Clarify Goals of Care   Date of Admission  05/18/15   Date first seen by Palliative Care  05/20/15   # of days Palliative referral response time  1 Day(s)   # of days IP prior to Palliative referral  1   Clinical Assessment    Psychosocial & Spiritual Assessment    Palliative Care Outcomes       Additional Data Reviewed: CBC    Component Value Date/Time   WBC 14.1* 05/29/2015 0557   RBC 4.28 05/29/2015 0557   HGB 12.1* 05/29/2015 0557   HCT 38.4* 05/29/2015 0557   PLT 216 05/29/2015 0557   MCV 89.7 05/29/2015 0557   MCH 28.3 05/29/2015 0557   MCHC 31.5 05/29/2015 0557   RDW 15.3 05/29/2015 0557   LYMPHSABS 1.3 03/14/2015 1220   MONOABS 0.9 03/14/2015 1220   EOSABS 0.1  03/14/2015 1220   BASOSABS 0.0 03/14/2015 1220    CMP     Component Value Date/Time   NA 139 05/29/2015 0557   K 3.4* 05/29/2015 0557   CL 107 05/29/2015 0557   CO2 24 05/29/2015 0557   GLUCOSE 100* 05/29/2015 0557   BUN 28* 05/29/2015 0557   CREATININE 1.08 05/29/2015 0557   CALCIUM 8.3* 05/29/2015 0557   PROT 6.6 05/18/2015 2140   ALBUMIN 3.1* 05/18/2015 2140   AST 21 05/18/2015 2140   ALT 19 05/18/2015 2140   ALKPHOS 98 05/18/2015 2140   BILITOT 0.6 05/18/2015 2140   GFRNONAA >60 05/29/2015 0557   GFRAA >60 05/29/2015 0557       Problem List:  Patient Active Problem List   Diagnosis Date Noted  . Anemia of chronic disease 06/01/2015  . Protein-calorie malnutrition, severe ([place]) 06/01/2015  . Paroxysmal atrial fibrillation ([place]) 06/01/2015  . Abdominal distension 06/01/2015  . DNR (do not resuscitate)   . Hypertensive urgency 05/21/2015  . CKD (chronic kidney disease) stage 3, GFR 30-59 ml/min 05/21/2015  . History of DVT (deep vein thrombosis) 05/21/2015  . Leukocytosis 05/21/2015  . Gastric outlet obstruction 05/18/2015  . Urothelial carcinoma ([place]) 02/15/2015  . Rheumatoid arthritis ([place]) 02/15/2015  . UTI (lower urinary tract infection) 11/08/2011     Palliative Care Assessment & Plan    1.Code Status:   DNR       Code Status Orders        Start     Ordered   05/19/15 0002  Do not attempt resuscitation (DNR)   Continuous    Question Answer Comment  In the event of cardiac or respiratory ARREST Do not call a "code blue"   In the event of cardiac or respiratory ARREST Do not perform  Intubation, CPR, defibrillation or ACLS   In the event of cardiac or respiratory ARREST Use medication by any route, position, wound care, and other measures to relive pain and suffering. May use oxygen, suction and manual treatment of airway obstruction as needed for comfort.      05/19/15 0002    Code Status History    Date Active Date Inactive Code Status  Order ID Comments User Context   05/07/2015  2:31 AM 05/11/2015  3:15 PM Full Code [id number]  Ivor Costa, MD ED   04/05/2015  3:59 AM 04/09/2015  2:58 PM Full Code [id number]  Oswald Hillock, MD Inpatient   03/14/2015  4:26 PM 03/20/2015  6:52 PM Full Code [id number]  Elmarie Shiley, MD Inpatient   02/15/2015  8:15 PM 02/22/2015 10:17 PM Full Code [id number]  Venetia Maxon Rama, MD Inpatient   05/15/2014 12:30 AM 05/15/2014  8:16 PM Full Code [id number]  Allyne Gee, MD Inpatient   07/04/2012  8:45 PM 07/08/2012  4:33 PM Full Code [id number]  Derrill Kay, MD ED   11/08/2011 11:45 PM 11/13/2011  5:35 AM Full Code [id number]  Lenis Noon, RN Inpatient    Advance Directive Documentation        Most Recent Value   Type of Advance Directive  Healthcare Power of Attorney   Pre-existing out of facility DNR order (yellow form or pink MOST form)     "MOST" Form in Place?         Desire for further Chaplaincy support:no  Psycho-social Needs: Education on Hospice  3. Symptom Management:      1. Covert IV medication to oral agents in anticipation for discharge home                Agree with increase of Oxycodone 5-10 mg po every 4 hrs prn. Continue OxyContin ER 30 mg po every 12 hrs.  Use IV dilaudid 1-2 mg every 2 hours only after oral agents have been exhausted.        2. Constipation: LBM 1/29?Marland Kitchen Senokot-S 2 tablets BID. Sorbitol prn. Lactulose x 1 today.   4. Palliative Prophylaxis:   Aspiration, Bowel Regimen, Frequent Pain Assessment and Oral Care   6. Discharge Planning:  Home with hospice services - hopefully tomorrow.   Discussed with Dr. Charlies Silvers.   Thank you for allowing the Palliative Medicine Team to assist in the care of this patient.   Time In:  0730 Time Out: 0800 Total Time 30 min Prolonged Time Billed  no         Pershing Proud, NP  [id number], 1:45 PM  Please contact Palliative Medicine Team phone at [phone] for questions and concerns.

## 2015-06-01 NOTE — Care Management Note (Signed)
Case Management Note  Patient Details  Name: Craig Waters. MRN: [id number] Date of Birth: [birth year]  Subjective/Objective:Per patient request-spoke to dtr-[name] about d/c plans. She needed a pcp listing to choose a doctor to follow in community.  Provided w/standard pcp listing, & also [employer] pcp listing.Also informed [name] that she could choose the [facility] doctor that follows their patients.[name] voiced understanding.[name] also mentioned that [facility] is arranging a w/c.                      Action/Plan:d/c home in am w/[facility] services.   Expected Discharge Date:                  Expected Discharge Plan:  Home w Hospice Care  In-House Referral:     Discharge planning Services  CM Consult  Post Acute Care Choice:  Home Health (Active [facility]-HHRN/PT) Choice offered to:     DME Arranged:    DME Agency:     HH Arranged:  RN [place] Agency:  [facility]  Status of Service:  In process, will continue to follow  Medicare Important Message Given:  Yes Date Medicare IM Given:    Medicare IM give by:    Date Additional Medicare IM Given:    Additional Medicare Important Message give by:     If discussed at [place] of Stay Meetings, dates discussed:    Additional Comments:  Dessa Phi, RN 06/01/2015, 4:07 PM

## 2015-06-01 NOTE — Telephone Encounter (Signed)
LM for pt's daughter to call me back to discuss her father's care.

## 2015-06-01 NOTE — Progress Notes (Signed)
Physical Therapy Treatment Patient Details Name: Craig Waters. MRN: [id number] DOB: 1930-05-02 Today's Date: 06/01/2015    History of Present Illness Ozil Wadler. is a 82 y.o. male with h/o stage 4 bladder cancer, mets to liver, abdomen. Patient presents to the ED with 3 day history of moderate abdominal pain, N/V, and dry heaving. Pt s/p EGD w/ transendoscopic stent (05/25/15) PMH includeds, RA, DVT, IVC filter, vertigo, PAF, CKD, glaucoma    PT Comments    Pt feeling better today and willing to walk.  Used + 2 assist for safety and had recliner following behind as pt demonstrates limited activity tolerance.   Follow Up Recommendations  Home health PT [facility])     Equipment Recommendations       Recommendations for Other Services       Precautions / Restrictions Precautions Precautions: Fall Precaution Comments: bil neph tubes Restrictions Weight Bearing Restrictions: No    Mobility  Bed Mobility Overal bed mobility: Needs Assistance Bed Mobility: Supine to Sit     Supine to sit: Supervision     General bed mobility comments: use of rail and elevated HOB  Transfers Overall transfer level: Needs assistance Equipment used: Rolling walker (2 wheeled) Transfers: Sit to/from Stand Sit to Stand: Min assist;+2 safety/equipment         General transfer comment: increased assist to rise  Ambulation/Gait Ambulation/Gait assistance: +2 safety/equipment;Min assist;Min guard Ambulation Distance (Feet): 30 Feet (15 feet x 2 obe sitting rest break) Assistive device: Rolling walker (2 wheeled) Gait Pattern/deviations: Step-through pattern;Decreased stride length;Trunk flexed Gait velocity: decreased   General Gait Details: very limited activity tolerance.  Recliner following closely behind.  Max c/o weakness/fatigue.    Stairs            Wheelchair Mobility    Modified Rankin (Stroke Patients Only)       Balance                                     Cognition Arousal/Alertness: Awake/alert Behavior During Therapy: WFL for tasks assessed/performed Overall Cognitive Status: Within Functional Limits for tasks assessed                      Exercises      General Comments        Pertinent Vitals/Pain Pain Assessment: Faces Faces Pain Scale: Hurts a little bit Pain Location: ABD/bloating Pain Descriptors / Indicators: Tightness Pain Intervention(s): Monitored during session;Repositioned    Home Living                      Prior Function            PT Goals (current goals can now be found in the care plan section) Progress towards PT goals: Progressing toward goals    Frequency  Min 3X/week    PT Plan Current plan remains appropriate    Co-evaluation             End of Session Equipment Utilized During Treatment: Gait belt Activity Tolerance: Patient tolerated treatment well Patient left: in chair;with call bell/phone within reach;with chair alarm set     Time: 1500-1525 PT Time Calculation (min) (ACUTE ONLY): 25 min  Charges:  $Gait Training: 8-22 mins $Therapeutic Activity: 8-22 mins                    G Codes:  Rica Koyanagi  PTA [facility]  Acute  Rehab Pager      [phone]

## 2015-06-02 ENCOUNTER — Ambulatory Visit: Payer: Medicare Other

## 2015-06-02 ENCOUNTER — Ambulatory Visit
Admit: 2015-06-02 | Discharge: 2015-06-02 | Disposition: A | Discharge status: Home / Self Care | Payer: Medicare Other | Attending: Radiation Oncology | Admitting: Radiation Oncology

## 2015-06-02 DIAGNOSIS — E440 Moderate protein-calorie malnutrition: Secondary | ICD-10-CM | POA: Insufficient documentation

## 2015-06-02 DIAGNOSIS — I480 Paroxysmal atrial fibrillation: Secondary | ICD-10-CM

## 2015-06-02 DIAGNOSIS — R140 Abdominal distension (gaseous): Secondary | ICD-10-CM

## 2015-06-02 DIAGNOSIS — R112 Nausea with vomiting, unspecified: Secondary | ICD-10-CM

## 2015-06-02 LAB — URINALYSIS, ROUTINE W REFLEX MICROSCOPIC
GLUCOSE, UA: NEGATIVE mg/dL
KETONES UR: 15 mg/dL — AB
Nitrite: POSITIVE — AB
Specific Gravity, Urine: 1.023 (ref 1.005–1.030)
pH: 5.5 (ref 5.0–8.0)

## 2015-06-02 LAB — URINE MICROSCOPIC-ADD ON

## 2015-06-02 MED ORDER — OMEPRAZOLE 2 MG/ML ORAL SUSPENSION: 20.0000 mg | Freq: Every day | ORAL | Status: AC

## 2015-06-02 MED ORDER — LACTULOSE 10 GM/15ML PO SOLN: 30.0000 g | Freq: Every day | ORAL | Status: DC

## 2015-06-02 MED ORDER — ENSURE ENLIVE PO LIQD: 237.0000 mL | Freq: Two times a day (BID) | ORAL | Status: AC

## 2015-06-02 MED ORDER — ONDANSETRON 4 MG PO TBDP
4.0000 mg | ORAL_TABLET | Freq: Three times a day (TID) | ORAL | Status: DC
Start: 2015-06-02 — End: 2015-06-02
  Administered 2015-06-02: 4 mg via ORAL
  Filled 2015-06-02 (×2): qty 1

## 2015-06-02 MED ORDER — ONDANSETRON HCL 4 MG/2ML IJ SOLN: 4.0000 mg | Freq: Three times a day (TID) | INTRAMUSCULAR | Status: DC

## 2015-06-02 MED ORDER — HALOPERIDOL LACTATE 2 MG/ML PO CONC
1.0000 mg | Freq: Four times a day (QID) | ORAL | Status: DC | PRN
  Filled 2015-06-02: qty 0.5

## 2015-06-02 MED ORDER — LACTULOSE 10 GM/15ML PO SOLN: 30.0000 g | Freq: Every day | ORAL | Status: DC | PRN

## 2015-06-02 MED ORDER — FENTANYL 50 MCG/HR TD PT72
50.0000 ug | MEDICATED_PATCH | TRANSDERMAL | Status: DC
  Administered 2015-06-02: 50 ug via TRANSDERMAL
  Filled 2015-06-02: qty 1

## 2015-06-02 MED ORDER — LACTULOSE 10 GM/15ML PO SOLN
30.0000 g | Freq: Every day | ORAL | Status: DC | PRN
  Filled 2015-06-02: qty 45

## 2015-06-02 MED ORDER — PANTOPRAZOLE SODIUM 40 MG PO TBEC: 40.0000 mg | DELAYED_RELEASE_TABLET | Freq: Every day | ORAL | Status: DC

## 2015-06-02 MED ORDER — MORPHINE SULFATE (CONCENTRATE) 10 MG/0.5ML PO SOLN: 10.0000 mg | ORAL | Status: AC | PRN

## 2015-06-02 MED ORDER — LACTULOSE 10 GM/15ML PO SOLN: 30.0000 g | Freq: Two times a day (BID) | ORAL | Status: AC

## 2015-06-02 MED ORDER — OMEPRAZOLE 20 MG PO CPDR: 20.0000 mg | DELAYED_RELEASE_CAPSULE | Freq: Every day | ORAL | Status: DC

## 2015-06-02 MED ORDER — MORPHINE SULFATE (CONCENTRATE) 10 MG/0.5ML PO SOLN: 10.0000 mg | ORAL | Status: DC | PRN

## 2015-06-02 MED ORDER — PREDNISONE 5 MG/5ML PO SOLN
5.0000 mg | Freq: Every day | ORAL | Status: AC
Start: 2015-06-02 — End: ?

## 2015-06-02 MED ORDER — DILTIAZEM 12 MG/ML ORAL SUSPENSION: 30.0000 mg | Freq: Four times a day (QID) | ORAL | Status: AC

## 2015-06-02 MED ORDER — OXYCODONE HCL ER 30 MG PO T12A: 30.0000 mg | EXTENDED_RELEASE_TABLET | Freq: Two times a day (BID) | ORAL | Status: DC

## 2015-06-02 MED ORDER — FENTANYL 50 MCG/HR TD PT72: 50.0000 ug | MEDICATED_PATCH | TRANSDERMAL | Status: AC

## 2015-06-02 MED ORDER — PHENAZOPYRIDINE HCL 100 MG PO TABS: 100.0000 mg | ORAL_TABLET | Freq: Three times a day (TID) | ORAL | Status: AC

## 2015-06-02 MED ORDER — ONDANSETRON 4 MG PO TBDP: 4.0000 mg | ORAL_TABLET | Freq: Three times a day (TID) | ORAL | Status: AC

## 2015-06-02 MED ORDER — ENSURE ENLIVE PO LIQD: 237.0000 mL | Freq: Two times a day (BID) | ORAL | Status: DC

## 2015-06-02 MED ORDER — HALOPERIDOL LACTATE 2 MG/ML PO CONC: 1.0000 mg | Freq: Four times a day (QID) | ORAL | Status: AC | PRN

## 2015-06-02 MED ORDER — MORPHINE SULFATE (CONCENTRATE) 10 MG/0.5ML PO SOLN
10.0000 mg | ORAL | Status: DC | PRN
  Administered 2015-06-02: 15 mg via ORAL
  Filled 2015-06-02: qty 1

## 2015-06-02 MED ORDER — PHENAZOPYRIDINE HCL 100 MG PO TABS
100.0000 mg | ORAL_TABLET | Freq: Three times a day (TID) | ORAL | Status: DC
  Administered 2015-06-02: 100 mg via ORAL
  Filled 2015-06-02 (×2): qty 1

## 2015-06-02 MED ORDER — MORPHINE SULFATE (CONCENTRATE) 10 MG/0.5ML PO SOLN
10.0000 mg | ORAL | Status: DC | PRN
  Administered 2015-06-02: 10 mg via ORAL
  Filled 2015-06-02: qty 0.5

## 2015-06-02 NOTE — Care Management Note (Signed)
Case Management Note  Patient Details  Name: Tristian Rairigh. MRN: [id number] Date of Birth: 08/19/1930  Subjective/Objective:  [facility] liason Stacey aware of d/c-home visit scheduled for tomorrow-dme has already been delivered-arrangements for w/c delivery in process.                  Action/Plan:d/c home w/[facility].   Expected Discharge Date:                  Expected Discharge Plan:  Home w Hospice Care  In-House Referral:     Discharge planning Services  CM Consult  Post Acute Care Choice:  Home Health (Active [facility]-HHRN/PT) Choice offered to:     DME Arranged:    DME Agency:     HH Arranged:  RN [place] Agency:  [facility]  Status of Service:  Completed, signed off  Medicare Important Message Given:  Yes Date Medicare IM Given:    Medicare IM give by:    Date Additional Medicare IM Given:    Additional Medicare Important Message give by:     If discussed at [place] of Stay Meetings, dates discussed:    Additional Comments:  Dessa Phi, RN 06/02/2015, 2:30 PM

## 2015-06-02 NOTE — Care Management Note (Signed)
Case Management Note  Patient Details  Name: Stein Burgos. MRN: [id number] Date of Birth: Jul 24, 1930  Subjective/Objective: Faxed w/confirmation d/c summary to [phone] aware.  Home nurse visit schedlued for tomorrow.Family in agreement.                 Action/Plan:d/c home w/[facility].   Expected Discharge Date:                  Expected Discharge Plan:  Home w Hospice Care  In-House Referral:     Discharge planning Services  CM Consult  Post Acute Care Choice:  Home Health (Active [facility]-HHRN/PT) Choice offered to:     DME Arranged:    DME Agency:     HH Arranged:  RN [place] Agency:  [facility]  Status of Service:  Completed, signed off  Medicare Important Message Given:  Yes Date Medicare IM Given:    Medicare IM give by:    Date Additional Medicare IM Given:    Additional Medicare Important Message give by:     If discussed at [place] of Stay Meetings, dates discussed:    Additional Comments:  Dessa Phi, RN 06/02/2015, 3:49 PM

## 2015-06-02 NOTE — Progress Notes (Signed)
[facility] Liaison Note:  Followed-up via phone conversation with [name], daughter regarding home with hospice services after discharge. Patient has been approved for services by medical director, Dr. Pleas Koch. Plan is to discharge home this afternoon.  Admission RN scheduled to see patient in the home at [place] instructed to call [facility] if any needs arise prior to this visit.  Please send signed, completed DNR form home with patient.  Patient will need prescriptions for discharge comfort medications.   DME needs discussed and hospital bed and bedside table already delivered.  W/C ordered for delivery today.  Completed discharge summary will need to be faxed to [facility] at [phone] when final.  Please notify [facility] when patient is ready to leave unit at discharge-call [phone].   [facility] information and contact numbers have been given to [facility] if any needs arise overnight.  Above information shared with Kathy,CMRN.   Please call with any questions.  Thank You,  Freddi Starr RN, [facility] Liaison  [phone]

## 2015-06-02 NOTE — Discharge Summary (Addendum)
Physician Discharge Summary  Craig Waters. XAJ:[id number] DOB: 06/09/1930 DOA: 05/18/2015  PCP: [name], MD  Admit date: 05/18/2015 Discharge date: 06/02/2015  Recommendations for Outpatient Follow-up:  1. Home with palliative care/hospice care 2. Dr. Alen Blew in 2 weeks or sooner as needed  Discharge Diagnoses:  Principal Problem:   Gastric outlet obstruction Active Problems:   UTI (lower urinary tract infection)   Urothelial carcinoma (HCC) metastatic to liver, retroperitoneum with duodenal compression   Rheumatoid arthritis (HCC)   Hypertensive urgency   CKD (chronic kidney disease) stage 3, GFR 30-59 ml/min   History of DVT (deep vein thrombosis)   Leukocytosis   DNR (do not resuscitate)   Anemia of chronic disease   Protein-calorie malnutrition, severe (HCC)   Paroxysmal atrial fibrillation (HCC)   Abdominal distension   Palliative care encounter   Pain   Malnutrition of moderate degree   Discharge Condition: stable, improved  Diet recommendation: full liquid diet with supplements: Magic Cup BID with meals  Wt Readings from Last 3 Encounters:  05/19/15 91.8 kg (202 lb 6.1 oz)  05/12/15 92.987 kg (205 lb)  05/07/15 92.987 kg (205 lb)    History of present illness:   82 y.o. male. Hx BPH, s/p radical prostatectomy. Hx A fib. DVT, s/p IVC filter, unable to take [facility] due to hematuria, stage 4 invasive urothelial bladder cancer, TURBT resection 02/28/2015 (high grade, poor prognosis per onc at [place]), s/p nephrostomy and ureteral tubes, recent admission 1/12 - 05/11/15 with UTI. He had bilateral percutaneous nephrostomy tubes 05/08/15 and underwent radiation tx by Dr Tammi Klippel.  He was readmitted with nausea and vomiting and found to have a gastric outlet obstruction.     Hospital Course:   His course has been complicated by severe lower abdominal pain and dysuria and possible recurrent UTI.     Patient had a duodenal stent placed 05/25/2015.  Gastric  outlet obstruction in the second portion of duodenum with retroperitoneal infiltration of his bladder cancer based on CT findings.  EGD done 1/26 demonstrated Class D esophagitis, retained solid and liquid gastric contents and long segment of stenosis/stricture in D2-D3 from extrinsic compression.  Upper GI series demonstrated about 8 cm long segment of narrowing of the second and third portion of the duodenum with the most severe area in the third portion over 3.5 cm segment.  Dr. Ardis Hughs, [facility], placed a duodenal stent on 1/31 and advised Mr. [name] to have only full liquids for now.  He met with the nutritionist who added magic cup TID.     Active Problems:  Abdominal distention with ongoing small volume emesis, no ascites seen on [place].  His antiemetics were changed to scheduled zofran with phenergan for breakthrough symptoms and further changes can be made at home by hospice.     Urothelial carcinoma ([place]), stage IV / Bladder wall thickening, metastatic to retroperitoneal lymph nodes  Per oncology, no role for chemo at this time, palliative and hospice were recommended and the patient enrolled in hospice during this hospitalization.  F/u with Dr. Alen Blew as needed.  Appreciate palliative care assistance with GOC and pain management.  He was started on roxinal and fentanyl patch and continued on pyridium indefinitely for symptom management.   Ureteral obstruction, status post bilateral nephrostomy tubes placement 05/08/2015 by urology during previous admission.  Tubes were adjusted during this hospitalization for comfort.     UTI, urine culture grew mixed flora.  He was started on ceftriaxone and completed 6 days of  antibiotics in hospital.  I have stopped his antibiotics for now and there is a urine culture pending at the time of discharge.  If his culture grows additional bacteria, I will call in a new prescription based on culture data.     Leukocytosis, stable near baseline of  12-14.   Moderate protein calorie malnutrition, seen by nutrition who recommended supplements.   Hypertensive urgency / essential hypertension, resumed diltiazem at discharge.   History of recent DVT, status post IVC filter placement, not a candidate for anticoagulation because of risk of bleeding   Atrial Fibrillation, CHA2DS2-VASc Score 3, Not on AC due to risk of bleed   Anemia of chronic disease, secondary to combination of chronic kidney disease and malignancy, mild and asymptomatic, stable near 12 mg/dl.  AKI due to obstruction, resolved and creatinine stable near 1.   Rheumatoid arthritis, resumed prednisone at discharge.     Procedures:  Duodenal stent placement on 05/25/2015  Consultations:  GI, Drs Fuller Plan and Ardis Hughs  Discharge Exam: Filed Vitals:   06/02/15 0509 06/02/15 1307  BP: 149/80 144/73  Pulse: 105 88  Temp: 98.1 F (36.7 C) 98.1 F (36.7 C)  Resp: 20 20   Filed Vitals:   06/01/15 1330 06/01/15 2200 06/02/15 0509 06/02/15 1307  BP: 138/80 123/91 149/80 144/73  Pulse: 95 96 105 88  Temp: 98 F (36.7 C) 97.8 F (36.6 C) 98.1 F (36.7 C) 98.1 F (36.7 C)  TempSrc: Oral Oral Oral Oral  Resp: 20 18 20 20   Height:      Weight:      SpO2: 97% 98% 96% 98%    General: Adult male, NAD Cardiovascular: RRR, no mrg Respiratory: CTAB, no increased WOB ABD:  NABS, soft, TTP in suprapubic area without rebound or guarding MSK:  Trace bilateral ankle edema, decreased muscle tone and bulk  Discharge Instructions      Discharge Instructions    Call MD for:  difficulty breathing, headache or visual disturbances    Complete by:  As directed      Call MD for:  extreme fatigue    Complete by:  As directed      Call MD for:  hives    Complete by:  As directed      Call MD for:  persistant dizziness or light-headedness    Complete by:  As directed      Call MD for:  persistant nausea and vomiting    Complete by:  As directed      Call MD for:   redness, tenderness, or signs of infection (pain, swelling, redness, odor or green/yellow discharge around incision site)    Complete by:  As directed      Call MD for:  severe uncontrolled pain    Complete by:  As directed      Call MD for:  temperature >100.4    Complete by:  As directed      Increase activity slowly    Complete by:  As directed             Medication List    STOP taking these medications        acidophilus Caps capsule     amiodarone 200 MG tablet  Commonly known as:  PACERONE     b complex vitamins tablet     diltiazem 240 MG 24 hr capsule  Commonly known as:  CARDIZEM CD     hyoscyamine 0.125 MG SL tablet  Commonly known as:  LEVSIN SL     mirtazapine 7.5 MG tablet  Commonly known as:  REMERON     ondansetron 4 MG tablet  Commonly known as:  ZOFRAN     oxycodone 5 MG capsule  Commonly known as:  OXY-IR     polyethylene glycol powder powder  Commonly known as:  GLYCOLAX/MIRALAX     predniSONE 5 MG tablet  Commonly known as:  DELTASONE  Replaced by:  predniSONE 5 MG/5ML solution     ranitidine 75 MG tablet  Commonly known as:  ZANTAC      TAKE these medications        diltiazem 10 mg/ml oral suspension  Commonly known as:  CARDIZEM  Take 3 mLs (30 mg total) by mouth 4 (four) times daily.     feeding supplement (ENSURE ENLIVE) Liqd  Take 237 mLs by mouth 2 (two) times daily between meals.     fentaNYL 50 MCG/HR  Commonly known as:  DURAGESIC - dosed mcg/hr  Place 1 patch (50 mcg total) onto the skin every 3 (three) days.     haloperidol 2 MG/ML solution  Commonly known as:  HALDOL  Take 0.5 mLs (1 mg total) by mouth every 6 (six) hours as needed for agitation (hallucination).     lactulose 10 GM/15ML solution  Commonly known as:  CHRONULAC  Take 45 mLs (30 g total) by mouth 2 (two) times daily.  Start taking on:  05/28/2015     morphine CONCENTRATE 10 MG/0.5ML Soln concentrated solution  Take 0.5 mLs (10 mg total) by mouth every  30 (thirty) minutes as needed for severe pain.     omeprazole 2 mg/mL Susp  Commonly known as:  PRILOSEC  Take 10 mLs (20 mg total) by mouth daily.     ondansetron 4 MG disintegrating tablet  Commonly known as:  ZOFRAN-ODT  Take 1 tablet (4 mg total) by mouth every 8 (eight) hours.     phenazopyridine 100 MG tablet  Commonly known as:  PYRIDIUM  Take 1 tablet (100 mg total) by mouth 3 (three) times daily with meals.     predniSONE 5 MG/5ML solution  Take 5 mLs (5 mg total) by mouth daily with breakfast.     sennosides-docusate sodium 8.6-50 MG tablet  Commonly known as:  SENOKOT-S  Take 2 tablets by mouth at bedtime.       Follow-up Information    Follow up with Hospice at [facility].   Specialty:  Hospice and Palliative Medicine   Why:  Home nurse.   Contact information:   [place] [address] [phone]       Follow up with Silvano Rusk, MD. Schedule an appointment as soon as possible for a visit in 3 weeks.   Specialty:  Gastroenterology   Contact information:   [address] [place] [address] [phone]       Follow up with Betsy Coder, MD. Schedule an appointment as soon as possible for a visit in 2 weeks.   Specialty:  Oncology   Why:  As needed   Contact information:   [place] [address] [phone]        The results of significant diagnostics from this hospitalization (including imaging, microbiology, ancillary and laboratory) are listed below for reference.    Significant Diagnostic Studies: Ct Abdomen Pelvis Wo Contrast  05/07/2015  CLINICAL DATA:  Stated history of small bowel obstruction. Patient reports no bowel movement for 2 days, has not  passed gas today. Hematuria. EXAM: CT ABDOMEN AND PELVIS WITHOUT CONTRAST TECHNIQUE: Multidetector CT imaging of the abdomen and pelvis was performed following the standard protocol without IV contrast. COMPARISON:  CT 03/14/2015 FINDINGS: Lower chest:  Elevation of right hemidiaphragm with adjacent compressive atelectasis in the right middle lobe. Coronary artery calcifications are seen. Liver: Hypodense subcapsular liver lesion involving the right lobe with likely increased in size from prior exam, currently 5.4 cm, previously 4.2 cm. Detailed hepatic evaluation limited secondary to lack of intravenous contrast. Hepatobiliary: Gallbladder physiologically distended, no calcified stone. No biliary dilatation. Pancreas: Fatty atrophy, no ductal dilatation. Spleen: Unremarkable noncontrast appearance. Adrenal glands: No nodule. Kidneys: Left ureteral stent in place. 44m stone is seen adjacent to the stent in the left distal ureter, similar in position to prior. The previous left nephrostomy tube is no longer seen. There is mild dilatation of the renal pelvis and proximal ureter despite stent placement. Mild right hydroureteronephrosis is similar to prior, with ureteral dilatation throughout its course hypodense lesions within the right kidney, likely cysts. Stomach/Bowel: Stomach mildly distended with ingested contrast. There are no dilated or thickened small bowel loops. Small bowel loops are decompressed. Moderate stool in the right and transverse colon, small volume of stool in the descending and rectosigmoid colon. Equivocal colonic wall thickening involving the distal sigmoid. Appendix is not seen, surgically absent. Vascular/Lymphatic: Small retroperitoneal lymph nodes, not enlarged by size criteria. Left pelvic sidewall node, 10 mm Jeanise Durfey axis. Abdominal aorta is normal in caliber. Moderate atherosclerosis without aneurysm. An IVC filter is in place. Reproductive: Post prostatectomy per report. Bladder: Marked wall irregularity and thickening. Minimal urine in the bladder lumen. Perivesicular inflammatory change, mildly increased in the interim. Other: No free air, free fluid, or intra-abdominal fluid collection. Fat within both inguinal canals.  Musculoskeletal: There are no acute or suspicious osseous abnormalities. Stable degenerative change in the spine. Anterolisthesis of L3 on L4 with associated degenerative change. No destructive lytic or blastic osseous lesion. IMPRESSION: 1. No bowel obstruction. Low moderate stool burden in the right and transverse colon, with decompressed distal colon. Equivocal wall thickening of the distal sigmoid colon, may reflect minimal colitis in the appropriate clinical setting. 2. Left ureteral stent with minimal residual hydronephrosis and proximal ureter. 5 mm stone in the distal left ureter adjacent to the stent is seen. Unchanged right hydroureteronephrosis. 3. Diffuse bladder wall thickening and irregularity, with minimal increased thin perivesicular inflammation from prior. Small left pelvic lymph node is unchanged, concerning for nodal disease. 4. Right lobe hepatic lesion has minimally increased in size in the interim, concerning for progression of metastatic disease. Electronically Signed   By: MJeb LeveringM.D.   On: 05/07/2015 01:24   Dg Abd 1 View - Kub  05/25/2015  CLINICAL DATA:  Duodenal stent placement EXAM: ABDOMEN - 1 VIEW COMPARISON:  05/18/2015, 05/21/2015 FLUOROSCOPY TIME:  Radiation Exposure Index (as provided by the fluoroscopic device): 57.37 mGy If the device does not provide the exposure index: Fluoroscopy Time:  3 minutes 6 seconds Number of Acquired Images:  6 FINDINGS: Initial images demonstrate a a endoscope in second third portion of the duodenum. A wire was placed and subsequently and expandable stent was placed across narrowed area within the duodenum. Final image demonstrates stent significantly narrowed by the underlying mass lesion. IMPRESSION: Duodenal stent placement as described. Electronically Signed   By: MInez CatalinaM.D.   On: 05/25/2015 15:25   UKoreaScrotum  05/07/2015  CLINICAL DATA:  Bilateral testicular tenderness.  Chronic pain. EXAM: SCROTAL ULTRASOUND DOPPLER  ULTRASOUND OF THE TESTICLES TECHNIQUE: Complete ultrasound examination of the testicles, epididymis, and other scrotal structures was performed. Color and spectral Doppler ultrasound were also utilized to evaluate blood flow to the testicles. COMPARISON:  None. FINDINGS: Right testicle Measurements: 4.4 x 2.6 x 2.7 cm. No mass or microlithiasis visualized. Left testicle Measurements: 3.8 x 2.7 x 2.8 cm. No mass or microlithiasis visualized. Right epididymis:  Normal in size and appearance. Left epididymis: Small cyst in the epididymal tail measuring 3.4 mm. Normal in size and appearance. Hydrocele:  None visualized. Varicocele:  Mild on the left. Pulsed Doppler interrogation of both testes demonstrates normal low resistance arterial and venous waveforms bilaterally. IMPRESSION: 1. Normal sonographic appearance of the testicles. 2. Small left varicocele.  Tiny left epididymal head cyst. Electronically Signed   By: Jeb Levering M.D.   On: 05/07/2015 02:01   Ct Abdomen Pelvis W Contrast  05/18/2015  CLINICAL DATA:  Right and left lower abdominal pain. Nausea and vomiting since Friday. Leukocytosis. History of hiatal hernia, gastric ulcer, appendectomy, diverticulitis, prostatectomy, bladder cancer, bilateral nephrostomies. EXAM: CT ABDOMEN AND PELVIS WITH CONTRAST TECHNIQUE: Multidetector CT imaging of the abdomen and pelvis was performed using the standard protocol following bolus administration of intravenous contrast. CONTRAST:  84m OMNIPAQUE IOHEXOL 300 MG/ML  SOLN COMPARISON:  05/06/2015 FINDINGS: The lung bases are clear. Coronary artery and aortic calcifications. There is a heterogeneously enhancing hypoechoic solid mass in the right lobe of the liver inferiorly measuring about 6.8 cm diameter. This was present previously but is better defined on the study with IV contrast material. Appearance is consistent with metastasis. Gallbladder and bile ducts appear normal. Pancreas, spleen, and adrenal glands  are unremarkable. Calcification of the abdominal aorta without aneurysm. Inferior vena caval filter is present. Kidneys demonstrate multiple cysts bilaterally. Bilateral nephrostomy tubes are present with additional left ureteral tube. Renal collecting systems are decompressed. There is scarring or infiltration in the fat of the retroperitoneum surrounding the ureters more prominent on the right. There is extension to the inferior uncinate process of the pancreas. This could represent retroperitoneal fibrosis or neoplastic or inflammatory stranding. Appearances are similar to previous study. The stomach is distended with decompression of the duodenum past the second portion. This is associated with a area of retroperitoneal scarring or infiltration. Gastric outlet obstruction should be excluded. Small bowel and colon are decompressed. No gastric wall thickening appreciated. No free air or free fluid in the abdomen. Abdominal wall musculature appears intact. Pelvis: Bladder is decompressed. There is diffuse asymmetric bladder wall thickening, likely neoplastic. There is loss of fat plane between the anterior bladder wall and the anterior pelvic wall. This could represent direct tumor extension or scarring from previous suprapubic catheter. No significant lymphadenopathy in the pelvis. There appears to been trans urethral resection of the prostate gland. Seminal vesicles are present without enlargement. Degenerative changes in the spine. Slight anterior subluxation of L3 on L4 with associated spondylolysis. No destructive bone lesions. IMPRESSION: There appears to be gastric outlet obstruction with distended fluid-filled stomach to the level of the second portion the duodenum. There is retroperitoneal infiltration or scarring which may be causing gastric outlet obstruction. Duodenal mass or inflammation not excluded. Diffuse retroperitoneal inflammation or scarring most prominent on the right extending down to the  pelvis. Bilateral percutaneous nephrostomy tubes and left ureteral tube. Diffuse asymmetric bladder wall thickening, likely neoplastic. Loss of distinction between the anterior bladder wall and anterior abdominal wall suggests direct infiltration  versus scarring. Metastatic lesion in the right lobe of the liver appears unchanged since prior study. Electronically Signed   By: Lucienne Capers M.D.   On: 05/18/2015 23:31   US Abdomen Limited  06/01/2015  CLINICAL DATA:  Abdominal distention.  Clinical concern for ascites. EXAM: LIMITED ABDOMEN ULTRASOUND FOR ASCITES TECHNIQUE: Limited ultrasound survey for ascites was performed in all four abdominal quadrants. COMPARISON:  Abdomen and pelvis CT dated 03/27/2016. FINDINGS: Survey of the 4 quadrants of the abdomen demonstrated no free peritoneal fluid. IMPRESSION: No free peritoneal fluid. Electronically Signed   By: Claudie Revering M.D.   On: 06/01/2015 17:53   [place] Art/ven Flow Abd Pelv Doppler  05/07/2015  CLINICAL DATA:  Bilateral testicular tenderness.  Chronic pain. EXAM: SCROTAL ULTRASOUND DOPPLER ULTRASOUND OF THE TESTICLES TECHNIQUE: Complete ultrasound examination of the testicles, epididymis, and other scrotal structures was performed. Color and spectral Doppler ultrasound were also utilized to evaluate blood flow to the testicles. COMPARISON:  None. FINDINGS: Right testicle Measurements: 4.4 x 2.6 x 2.7 cm. No mass or microlithiasis visualized. Left testicle Measurements: 3.8 x 2.7 x 2.8 cm. No mass or microlithiasis visualized. Right epididymis:  Normal in size and appearance. Left epididymis: Small cyst in the epididymal tail measuring 3.4 mm. Normal in size and appearance. Hydrocele:  None visualized. Varicocele:  Mild on the left. Pulsed Doppler interrogation of both testes demonstrates normal low resistance arterial and venous waveforms bilaterally. IMPRESSION: 1. Normal sonographic appearance of the testicles. 2. Small left varicocele.  Tiny left  epididymal head cyst. Electronically Signed   By: Jeb Levering M.D.   On: 05/07/2015 02:01   Dg Ugi W/high Density W/kub  05/21/2015  CLINICAL DATA:  Duodenal obstruction.  Metastatic bladder cancer. EXAM: UPPER GI SERIES WITHOUT KUB TECHNIQUE: Routine upper GI series was performed with thin barium and water-soluble contrast. FLUOROSCOPY TIME:  Fluoroscopy Time (in minutes and seconds): 3 minutes 44 seconds COMPARISON:  CT scan dated 05/18/2015 FINDINGS: Thin barium was instilled in the duodenal ball of the via the indwelling nasogastric tube. There was slow passage of contrast into the distal second portion of the duodenum and gradually into the third portion of the duodenum. With small amount water-soluble contrast was also introduced. After 8 minutes contrast had passed through the duodenum to just beyond the ligament of Treitz. There is a marked narrowing of the third portion of the duodenum over a ~3.5-4.0 cm segment. There is slight narrowing of second portion of the duodenum over the ~4 cm proximal to the severe narrowing. The fourth portion of the duodenum the proximal jejunum beyond the ligament of Treitz appear normal. The visualized portion of the stomach and pylorus and duodenal bulb are normal. IMPRESSION: 7.5-8 cm long segment of narrowing of the second and third portions of the duodenum. The most severe area of narrowing is in the third portion of the duodenum over a 3.5-4 cm segment. Electronically Signed   By: Lorriane Shire M.D.   On: 05/21/2015 11:24   Dg C-arm 1-60 Min-no Report  05/25/2015  CLINICAL DATA: dueodenal stricture C-ARM 1-60 MINUTES Fluoroscopy was utilized by the requesting physician.  No radiographic interpretation.   Ir Nephrostogram Left Thru Existing Access  05/27/2015  INDICATION: Recently placed bilateral percutaneous nephrostomy tubes on 05/08/2015. Tube exit sites are painful. Evaluate for proper positioning of nephrostomy tubes. EXAM: IR NEPHROSTOGRAM EXISTING  ACCESS LEFT; IR NEPHROSTOGRAM EXISTING ACCESS RIGHT COMPARISON:  Images at the time of percutaneous nephrostomy placement 05/08/2015 MEDICATIONS: None ANESTHESIA/SEDATION: None  CONTRAST:  20 mL Omnipaque 300 - administered into the collecting system(s) FLUOROSCOPY TIME:  Fluoroscopy Time: 0 minutes 6 seconds (1 mGy). COMPLICATIONS: None immediate. Estimated blood loss: None PROCEDURE: Informed written consent was obtained from the patient after a thorough discussion of the procedural risks, benefits and alternatives. All questions were addressed. A timeout was performed prior to the initiation of the procedure. Antegrade nephrostogram spur performed through first the right, and then the left percutaneous nephrostomy tubes. Both tubes are well positioned within the renal pelves. There is a right-sided double-J ureteral stent. No hydronephrosis. Examination of the skin exit sites demonstrates some mild irritation and scabbing at the site of the retention sutures. The retention sutures were cut and removed. Adhesive fixation devices were applied bilaterally. IMPRESSION: 1. Well-positioned and normally functioning percutaneous nephrostomy tubes bilaterally. 2. Patient discomfort appears to be arising from mild irritation and scab formation at the site of the retention sutures. The retention sutures were removed and adhesive fixation devices applied. Return to interventional radiology in 4 weeks for routine nephrostomy tube exchanges. Signed, Criselda Peaches, MD [facility] Electronically Signed   By: Jacqulynn Cadet M.D.   On: 05/27/2015 16:57   Ir Nephrostogram Right Thru Existing Access  05/27/2015  INDICATION: Recently placed bilateral percutaneous nephrostomy tubes on 05/08/2015. Tube exit sites are painful. Evaluate for proper positioning of nephrostomy tubes. EXAM: IR NEPHROSTOGRAM EXISTING ACCESS LEFT; IR NEPHROSTOGRAM EXISTING ACCESS RIGHT  COMPARISON:  Images at the time of percutaneous nephrostomy placement 05/08/2015 MEDICATIONS: None ANESTHESIA/SEDATION: None CONTRAST:  20 mL Omnipaque 300 - administered into the collecting system(s) FLUOROSCOPY TIME:  Fluoroscopy Time: 0 minutes 6 seconds (1 mGy). COMPLICATIONS: None immediate. Estimated blood loss: None PROCEDURE: Informed written consent was obtained from the patient after a thorough discussion of the procedural risks, benefits and alternatives. All questions were addressed. A timeout was performed prior to the initiation of the procedure. Antegrade nephrostogram spur performed through first the right, and then the left percutaneous nephrostomy tubes. Both tubes are well positioned within the renal pelves. There is a right-sided double-J ureteral stent. No hydronephrosis. Examination of the skin exit sites demonstrates some mild irritation and scabbing at the site of the retention sutures. The retention sutures were cut and removed. Adhesive fixation devices were applied bilaterally. IMPRESSION: 1. Well-positioned and normally functioning percutaneous nephrostomy tubes bilaterally. 2. Patient discomfort appears to be arising from mild irritation and scab formation at the site of the retention sutures. The retention sutures were removed and adhesive fixation devices applied. Return to interventional radiology in 4 weeks for routine nephrostomy tube exchanges. Signed, Criselda Peaches, MD [facility] Radiology Electronically Signed   By: Jacqulynn Cadet M.D.   On: 05/27/2015 16:57   Ir Nephrostomy Placement Left  05/09/2015  CLINICAL DATA:  Anuric EXAM: IR NEPHROSTOMY PLACEMENT LEFT; IR NEPHROSTOMY PLACEMENT RIGHT FLUOROSCOPY TIME:  Three minutes ANESTHESIA/SEDATION: Moderate sedation time: 30 minutes CONTRAST:  10 cc Omnipaque 300 PROCEDURE: The procedure, risks, benefits, and alternatives were explained to the patient. Questions regarding  the procedure were encouraged and answered. The patient understands and consents to the procedure. The back was prepped with ChloraPrep in a sterile fashion, and a sterile drape was applied covering the operative field. A sterile gown and sterile gloves were used for the procedure. Under sonographic guidance, a 21 gauge needle was inserted into a posterior lower pole calyx of the right kidney. Contrast was injected opacifying the collecting system.  The needle was removed over a 018 wire which was up sized to a 3 J. A 10 French dilator followed by a 10 [place] nephrostomy or advanced over the wire. It was looped and string fixed within the renal pelvis. It was sewn to the skin. Contrast was injected. Under sonographic guidance, a 21 gauge needle was advanced into the left renal pelvis. Contrast and gas were injected. A second needle was then advanced into the lower pole calyx and removed over a 018 wire which was up sized to a 3 J. A 10 French dilator followed by a 10 [place] drain were inserted. It was looped and string fixed in the renal pelvis. Contrast was injected. FINDINGS: Imaging documents access into both lower pole collecting systems via 21 gauge needles. Contrast fills the renal collecting systems bilaterally demonstrating hydronephrosis an ureteral obstruction. Final imaging demonstrates bilateral 10 French nephrostomy catheters. COMPLICATIONS: None IMPRESSION: Successful bilateral nephrostomy catheter placement for ureteral obstruction. Electronically Signed   By: Marybelle Killings M.D.   On: 05/09/2015 08:11   Ir Nephrostomy Placement Right  05/09/2015  CLINICAL DATA:  Anuric EXAM: IR NEPHROSTOMY PLACEMENT LEFT; IR NEPHROSTOMY PLACEMENT RIGHT FLUOROSCOPY TIME:  Three minutes ANESTHESIA/SEDATION: Moderate sedation time: 30 minutes CONTRAST:  10 cc Omnipaque 300 PROCEDURE: The procedure, risks, benefits, and alternatives were explained to the patient. Questions regarding the procedure were encouraged and  answered. The patient understands and consents to the procedure. The back was prepped with ChloraPrep in a sterile fashion, and a sterile drape was applied covering the operative field. A sterile gown and sterile gloves were used for the procedure. Under sonographic guidance, a 21 gauge needle was inserted into a posterior lower pole calyx of the right kidney. Contrast was injected opacifying the collecting system. The needle was removed over a 018 wire which was up sized to a 3 J. A 10 French dilator followed by a 10 [place] nephrostomy or advanced over the wire. It was looped and string fixed within the renal pelvis. It was sewn to the skin. Contrast was injected. Under sonographic guidance, a 21 gauge needle was advanced into the left renal pelvis. Contrast and gas were injected. A second needle was then advanced into the lower pole calyx and removed over a 018 wire which was up sized to a 3 J. A 10 French dilator followed by a 10 [place] drain were inserted. It was looped and string fixed in the renal pelvis. Contrast was injected. FINDINGS: Imaging documents access into both lower pole collecting systems via 21 gauge needles. Contrast fills the renal collecting systems bilaterally demonstrating hydronephrosis an ureteral obstruction. Final imaging demonstrates bilateral 10 French nephrostomy catheters. COMPLICATIONS: None IMPRESSION: Successful bilateral nephrostomy catheter placement for ureteral obstruction. Electronically Signed   By: Marybelle Killings M.D.   On: 05/09/2015 08:11    Microbiology: Recent Results (from the past 240 hour(s))  Culture, Urine     Status: None   Collection Time: 05/27/15  5:06 PM  Result Value Ref Range Status   Specimen Description URINE, CLEAN CATCH  Final   Special Requests NONE  Final   Culture   Final    MULTIPLE SPECIES PRESENT, SUGGEST RECOLLECTION Performed at [facility]    Report Status 05/31/2015 FINAL  Final     Labs: Basic Metabolic Panel:  Recent  Labs Lab 05/28/15 0545 05/29/15 0557  NA 141 139  K 4.0 3.4*  CL 107 107  CO2 27 24  GLUCOSE 123* 100*  BUN 26* 28*  CREATININE 1.04 1.08  CALCIUM 8.8* 8.3*   Liver Function Tests: No results for input(s): AST, ALT, ALKPHOS, BILITOT, PROT, ALBUMIN in the last 168 hours. No results for input(s): LIPASE, AMYLASE in the last 168 hours. No results for input(s): AMMONIA in the last 168 hours. CBC:  Recent Labs Lab 05/28/15 0545 05/29/15 0557  WBC 12.1* 14.1*  HGB 11.9* 12.1*  HCT 37.2* 38.4*  MCV 89.6 89.7  PLT 235 216   Cardiac Enzymes: No results for input(s): CKTOTAL, CKMB, CKMBINDEX, TROPONINI in the last 168 hours. BNP: BNP (last 3 results)  Recent Labs  02/15/15 1206 03/14/15 1220  BNP 522.9* 137.0*    ProBNP (last 3 results) No results for input(s): PROBNP in the last 8760 hours.  CBG:  Recent Labs Lab 05/31/15 0718  GLUCAP 93    Time coordinating discharge: 35 minutes  Signed:  Marysa Wessner  [facility] 06/02/2015, 3:12 PM

## 2015-06-02 NOTE — Progress Notes (Signed)
[facility] Progress Note  Subjective:   Craig Waters is an 82 year old male who is status post radical prostatectomy. He has a history of A. Fib  , DVT, status postIVC filter but is unable to take anticoagulation due to hematuria. He was found to have invasive urothelial bladder cancerand had TuRBT resection in November 2016. He is status post nephrostomy and ureteral tubes and had a recent admission for a UTI January 12 through 05/11/2015. He had bilateral percutaneous nephrostoy tubes 05/08/2015 and had radiation treatment by Dr. Tammi Klippel.  He was readmitted with nausea and vomiting and was foundto have a gatric outlet obstruction.    EGD by Dr. Fuller Plan 05/20/2015   Showed LA grade D esophagitis with retained gastric solids and liquids. Long acquired extrinsic stenosis in the third part of the duodenum and second part of the duodenum. He had an upper GI on January 27 that showed a 7.5-8 cm lon egment of narrowing of the second and third portions of the duodenum. The most severe area of narrowing is in the third portion of t duodenum over a 3.5-4 cm segment.  He underwent repeat EGD with transendoscopic stenting on January 31 b Dr. Ardis Hughs.  Fluoroscopy was used throughout the case in the center of the stricture was felt to be adjacent to his IVC filter. A larger diameter gastroscope was introduced and a 12 cm long uncovered metal mesh SEMS was placed in good position across the stricture the proximal end of the stent extended about 1 cm into the duodenal bulb. Patient reports that he has had less nausea since the stent placement. He reportedly was cnstipated and wet 6 days without a bowel movement. We were asked to see patient today because he is having small volume emesis again. Upon entering the room  He shouldn't has a small amount of watery emesis in his back. He reports that he had lactulose last night and had a bowel movement  Which provided some relief.he is accompanied in his room by 2  daughters and the patient and his 2 daughters state he is going home today with hospice.  Patient denies worsening abdominal pain. He is passing gas.   Objective:  Vital signs in last 24 hours: Temp:  [97.8 F (36.6 C)-98.1 F (36.7 C)] 98.1 F (36.7 C) (02/08 1307) Pulse Rate:  [88-105] 88 (02/08 1307) Resp:  [18-20] 20 (02/08 1307) BP: (123-149)/(73-91) 144/73 mmHg (02/08 1307) SpO2:  [96 %-98 %] 98 % (02/08 1307) Last BM Date: 05/23/15 General:   Alert,  Well-developed,    in NAD Heart:  Regular rate and rhythm; no murmurs Pulm;lungs clear Abdomen:  Soft,   Tender to palpation in suprapubic area , positive bowel sounds    Intake/Output from previous day: 02/07 0701 - 02/08 0700 In: 1010 [P.O.:960; IV Piggyback:50] Out: 475 [Urine:475] Intake/Output this shift: Total I/O In: -  Out: 1 [Emesis/NG output:1]    US Abdomen Limited  06/01/2015  CLINICAL DATA:  Abdominal distention.  Clinical concern for ascites. EXAM: LIMITED ABDOMEN ULTRASOUND FOR ASCITES TECHNIQUE: Limited ultrasound survey for ascites was performed in all four abdominal quadrants. COMPARISON:  Abdomen and pelvis CT dated 03/27/2016. FINDINGS: Survey of the 4 quadrants of the abdomen demonstrated no free peritoneal fluid. IMPRESSION: No free peritoneal fluid. Electronically Signed   By: Claudie Revering M.D.   On: 06/01/2015 17:53    ASSESSMENT/PLAN:    82 year old male with invasive bladder cancer ,  Status post stent  Placement of her gastric  outlet obstruction , now with recurrent small volume emesis  And nausea. Symptoms may be in part due to his disease, exacerbated by use of pain medications. Patient has normal active bowel sounds and is passing gas , aching obstruction unlikely.  Patient instructed to adhere to a full liquid diet due to his pain meds he should be on a daily bowel regimen of Senokot and lactulose on a daily basis. Patient and his daughters  Have been instructed to contact oncology or  Dr.  Celesta Aver office  If he has worsening nausea.patient is going home today with  Hospice and is adamant that he will not stay in the hospital any longer for further testing.   Principal Problem:   Gastric outlet obstruction Active Problems:   UTI (lower urinary tract infection)   Urothelial carcinoma (HCC)   Rheumatoid arthritis (HCC)   Hypertensive urgency   CKD (chronic kidney disease) stage 3, GFR 30-59 ml/min   History of DVT (deep vein thrombosis)   Leukocytosis   DNR (do not resuscitate)   Anemia of chronic disease   Protein-calorie malnutrition, severe (HCC)   Paroxysmal atrial fibrillation (HCC)   Abdominal distension   Palliative care encounter   Pain   Malnutrition of moderate degree     LOS: 15 days   Hvozdovic, Lori P PA-C 06/02/2015, Pager [phone] Mon-Fri 8a-5p [phone] after 5p, weekends, holidays   Attending physician's note   I have taken a history, examined the patient and reviewed the chart. I agree with the Advanced Practitioner's note, impression and recommendations. Invasive bladder ca with GOO s/p duodenal stent. Symptom management. Regular bowel regimen. Follow up with palliative care and is being discharged to hospice.    Damaris Hippo, MD [phone] Mon-Fri 8a-5p [phone] after 5p, weekends, holidays

## 2015-06-02 NOTE — Progress Notes (Signed)
Pt removed dressing covering bilateral nephrostomy tubes. Stat lock that held left nephrostomy tube removed by pt. Split gauze added to bilateral sites. Abdominal pad added to left side for extra support. Will continue to monitor sites closely.

## 2015-06-03 ENCOUNTER — Encounter (HOSPITAL_COMMUNITY): Payer: Self-pay

## 2015-06-03 ENCOUNTER — Ambulatory Visit: Payer: Medicare Other

## 2015-06-03 ENCOUNTER — Inpatient Hospital Stay (HOSPITAL_COMMUNITY)
Admission: EM | Admit: 2015-06-03 | Discharge: 2015-06-23 | DRG: 871 | Disposition: E | Attending: Internal Medicine | Admitting: Internal Medicine

## 2015-06-03 ENCOUNTER — Emergency Department (HOSPITAL_COMMUNITY)

## 2015-06-03 DIAGNOSIS — A419 Sepsis, unspecified organism: Secondary | ICD-10-CM | POA: Diagnosis not present

## 2015-06-03 DIAGNOSIS — R0600 Dyspnea, unspecified: Secondary | ICD-10-CM | POA: Diagnosis not present

## 2015-06-03 DIAGNOSIS — Z936 Other artificial openings of urinary tract status: Secondary | ICD-10-CM

## 2015-06-03 DIAGNOSIS — C679 Malignant neoplasm of bladder, unspecified: Secondary | ICD-10-CM | POA: Diagnosis not present

## 2015-06-03 DIAGNOSIS — R230 Cyanosis: Secondary | ICD-10-CM | POA: Diagnosis present

## 2015-06-03 DIAGNOSIS — R52 Pain, unspecified: Secondary | ICD-10-CM | POA: Diagnosis present

## 2015-06-03 DIAGNOSIS — Z515 Encounter for palliative care: Secondary | ICD-10-CM | POA: Diagnosis present

## 2015-06-03 DIAGNOSIS — N179 Acute kidney failure, unspecified: Secondary | ICD-10-CM | POA: Diagnosis not present

## 2015-06-03 DIAGNOSIS — C787 Secondary malignant neoplasm of liver and intrahepatic bile duct: Secondary | ICD-10-CM | POA: Diagnosis present

## 2015-06-03 DIAGNOSIS — Z7952 Long term (current) use of systemic steroids: Secondary | ICD-10-CM | POA: Diagnosis not present

## 2015-06-03 DIAGNOSIS — Z87442 Personal history of urinary calculi: Secondary | ICD-10-CM | POA: Diagnosis not present

## 2015-06-03 DIAGNOSIS — Z86718 Personal history of other venous thrombosis and embolism: Secondary | ICD-10-CM | POA: Diagnosis not present

## 2015-06-03 DIAGNOSIS — R6510 Systemic inflammatory response syndrome (SIRS) of non-infectious origin without acute organ dysfunction: Secondary | ICD-10-CM

## 2015-06-03 DIAGNOSIS — G893 Neoplasm related pain (acute) (chronic): Secondary | ICD-10-CM | POA: Diagnosis present

## 2015-06-03 DIAGNOSIS — J189 Pneumonia, unspecified organism: Secondary | ICD-10-CM

## 2015-06-03 DIAGNOSIS — C689 Malignant neoplasm of urinary organ, unspecified: Secondary | ICD-10-CM | POA: Diagnosis present

## 2015-06-03 DIAGNOSIS — Z79899 Other long term (current) drug therapy: Secondary | ICD-10-CM

## 2015-06-03 DIAGNOSIS — Z66 Do not resuscitate: Secondary | ICD-10-CM | POA: Diagnosis present

## 2015-06-03 DIAGNOSIS — Z8249 Family history of ischemic heart disease and other diseases of the circulatory system: Secondary | ICD-10-CM | POA: Diagnosis not present

## 2015-06-03 DIAGNOSIS — Z803 Family history of malignant neoplasm of breast: Secondary | ICD-10-CM

## 2015-06-03 DIAGNOSIS — K219 Gastro-esophageal reflux disease without esophagitis: Secondary | ICD-10-CM | POA: Diagnosis present

## 2015-06-03 DIAGNOSIS — R0689 Other abnormalities of breathing: Secondary | ICD-10-CM | POA: Diagnosis not present

## 2015-06-03 DIAGNOSIS — N390 Urinary tract infection, site not specified: Secondary | ICD-10-CM | POA: Diagnosis present

## 2015-06-03 DIAGNOSIS — F419 Anxiety disorder, unspecified: Secondary | ICD-10-CM | POA: Diagnosis present

## 2015-06-03 DIAGNOSIS — M069 Rheumatoid arthritis, unspecified: Secondary | ICD-10-CM | POA: Diagnosis present

## 2015-06-03 DIAGNOSIS — K311 Adult hypertrophic pyloric stenosis: Secondary | ICD-10-CM | POA: Diagnosis present

## 2015-06-03 DIAGNOSIS — I129 Hypertensive chronic kidney disease with stage 1 through stage 4 chronic kidney disease, or unspecified chronic kidney disease: Secondary | ICD-10-CM | POA: Diagnosis present

## 2015-06-03 DIAGNOSIS — I480 Paroxysmal atrial fibrillation: Secondary | ICD-10-CM | POA: Diagnosis present

## 2015-06-03 DIAGNOSIS — E1122 Type 2 diabetes mellitus with diabetic chronic kidney disease: Secondary | ICD-10-CM | POA: Diagnosis present

## 2015-06-03 DIAGNOSIS — N183 Chronic kidney disease, stage 3 unspecified: Secondary | ICD-10-CM | POA: Diagnosis present

## 2015-06-03 DIAGNOSIS — Z8711 Personal history of peptic ulcer disease: Secondary | ICD-10-CM

## 2015-06-03 DIAGNOSIS — I2510 Atherosclerotic heart disease of native coronary artery without angina pectoris: Secondary | ICD-10-CM | POA: Diagnosis present

## 2015-06-03 DIAGNOSIS — J9601 Acute respiratory failure with hypoxia: Secondary | ICD-10-CM | POA: Diagnosis present

## 2015-06-03 DIAGNOSIS — R0602 Shortness of breath: Secondary | ICD-10-CM | POA: Diagnosis not present

## 2015-06-03 HISTORY — DX: Malignant neoplasm of bladder, unspecified: C67.9

## 2015-06-03 HISTORY — DX: Type 2 diabetes mellitus without complications: E11.9

## 2015-06-03 HISTORY — DX: Essential (primary) hypertension: I10

## 2015-06-03 HISTORY — DX: Personal history of other medical treatment: Z92.89

## 2015-06-03 HISTORY — DX: Atherosclerotic heart disease of native coronary artery without angina pectoris: I25.10

## 2015-06-03 HISTORY — DX: Peptic ulcer, site unspecified, unspecified as acute or chronic, without hemorrhage or perforation: K27.9

## 2015-06-03 HISTORY — DX: Rheumatoid arthritis, unspecified: M06.9

## 2015-06-03 HISTORY — DX: Calculus of kidney: N20.0

## 2015-06-03 HISTORY — DX: Malignant neoplasm of bladder, unspecified: C78.7

## 2015-06-03 HISTORY — DX: Adult hypertrophic pyloric stenosis: K31.1

## 2015-06-03 LAB — I-STAT CHEM 8, ED
BUN: 38 mg/dL — AB (ref 6–20)
CALCIUM ION: 1.1 mmol/L — AB (ref 1.13–1.30)
CREATININE: 2.6 mg/dL — AB (ref 0.61–1.24)
Chloride: 103 mmol/L (ref 101–111)
GLUCOSE: 126 mg/dL — AB (ref 65–99)
HCT: 46 % (ref 39.0–52.0)
HEMOGLOBIN: 15.6 g/dL (ref 13.0–17.0)
Potassium: 4.2 mmol/L (ref 3.5–5.1)
Sodium: 136 mmol/L (ref 135–145)
TCO2: 18 mmol/L (ref 0–100)

## 2015-06-03 LAB — CBC WITH DIFFERENTIAL/PLATELET
Basophils Absolute: 0 10*3/uL (ref 0.0–0.1)
Basophils Relative: 0 %
Eosinophils Absolute: 0 10*3/uL (ref 0.0–0.7)
Eosinophils Relative: 0 %
HEMATOCRIT: 43.2 % (ref 39.0–52.0)
HEMOGLOBIN: 13.5 g/dL (ref 13.0–17.0)
LYMPHS ABS: 1 10*3/uL (ref 0.7–4.0)
Lymphocytes Relative: 4 %
MCH: 28.5 pg (ref 26.0–34.0)
MCHC: 31.3 g/dL (ref 30.0–36.0)
MCV: 91.1 fL (ref 78.0–100.0)
MONO ABS: 0.6 10*3/uL (ref 0.1–1.0)
MONOS PCT: 2 %
NEUTROS ABS: 21.8 10*3/uL — AB (ref 1.7–7.7)
NEUTROS PCT: 94 %
Platelets: 218 10*3/uL (ref 150–400)
RBC: 4.74 MIL/uL (ref 4.22–5.81)
RDW: 16.3 % — AB (ref 11.5–15.5)
WBC: 23.4 10*3/uL — ABNORMAL HIGH (ref 4.0–10.5)

## 2015-06-03 LAB — URINE MICROSCOPIC-ADD ON

## 2015-06-03 LAB — I-STAT CG4 LACTIC ACID, ED
Lactic Acid, Venous: 5.04 mmol/L (ref 0.5–2.0)
Lactic Acid, Venous: 2.47 mmol/L (ref 0.5–2.0)

## 2015-06-03 LAB — I-STAT TROPONIN, ED: Troponin i, poc: 0.09 ng/mL (ref 0.00–0.08)

## 2015-06-03 LAB — BLOOD GAS, ARTERIAL
ACID-BASE DEFICIT: 10.9 mmol/L — AB (ref 0.0–2.0)
Bicarbonate: 14 mEq/L — ABNORMAL LOW (ref 20.0–24.0)
DRAWN BY: 308601
FIO2: 1
O2 SAT: 93.7 %
PATIENT TEMPERATURE: 37
PCO2 ART: 29.5 mmHg — AB (ref 35.0–45.0)
TCO2: 13 mmol/L (ref 0–100)
pH, Arterial: 7.298 — ABNORMAL LOW (ref 7.350–7.450)
pO2, Arterial: 77.5 mmHg — ABNORMAL LOW (ref 80.0–100.0)

## 2015-06-03 LAB — URINALYSIS, ROUTINE W REFLEX MICROSCOPIC
GLUCOSE, UA: NEGATIVE mg/dL
Ketones, ur: 15 mg/dL — AB
Nitrite: POSITIVE — AB
PH: 5.5 (ref 5.0–8.0)
PROTEIN: 100 mg/dL — AB
SPECIFIC GRAVITY, URINE: 1.024 (ref 1.005–1.030)

## 2015-06-03 MED ORDER — CEFTRIAXONE SODIUM 1 G IJ SOLR
1.0000 g | Freq: Once | INTRAMUSCULAR | Status: AC
  Administered 2015-06-03: 1 g via INTRAVENOUS
  Filled 2015-06-03: qty 10

## 2015-06-03 MED ORDER — ENSURE ENLIVE PO LIQD
237.0000 mL | Freq: Two times a day (BID) | ORAL | Status: DC
  Administered 2015-06-03: 237 mL via ORAL

## 2015-06-03 MED ORDER — MORPHINE SULFATE (CONCENTRATE) 10 MG/0.5ML PO SOLN: 10.0000 mg | ORAL | Status: DC | PRN

## 2015-06-03 MED ORDER — ENOXAPARIN SODIUM 30 MG/0.3ML ~~LOC~~ SOLN: 30.0000 mg | SUBCUTANEOUS | Status: DC

## 2015-06-03 MED ORDER — POLYETHYLENE GLYCOL 3350 17 G PO PACK: 17.0000 g | PACK | Freq: Every day | ORAL | Status: DC | PRN

## 2015-06-03 MED ORDER — HALOPERIDOL LACTATE 2 MG/ML PO CONC
1.0000 mg | Freq: Four times a day (QID) | ORAL | Status: DC | PRN
Start: 2015-06-03 — End: 2015-06-04
  Administered 2015-06-03: 1 mg via ORAL
  Filled 2015-06-03 (×2): qty 0.5

## 2015-06-03 MED ORDER — MORPHINE SULFATE (PF) 4 MG/ML IV SOLN: 4.0000 mg | INTRAVENOUS | Status: DC | PRN

## 2015-06-03 MED ORDER — GLYCOPYRROLATE 0.2 MG/ML IJ SOLN
0.2000 mg | INTRAMUSCULAR | Status: DC | PRN
  Filled 2015-06-03: qty 1

## 2015-06-03 MED ORDER — ONDANSETRON HCL 4 MG PO TABS: 4.0000 mg | ORAL_TABLET | Freq: Four times a day (QID) | ORAL | Status: DC | PRN

## 2015-06-03 MED ORDER — ATROPINE SULFATE 1 % OP SOLN
4.0000 [drp] | OPHTHALMIC | Status: DC | PRN
  Administered 2015-06-03: 4 [drp] via SUBLINGUAL
  Filled 2015-06-03: qty 2

## 2015-06-03 MED ORDER — ONDANSETRON 4 MG PO TBDP
4.0000 mg | ORAL_TABLET | Freq: Three times a day (TID) | ORAL | Status: DC
  Administered 2015-06-03: 4 mg via ORAL
  Filled 2015-06-03: qty 1

## 2015-06-03 MED ORDER — MORPHINE BOLUS VIA INFUSION
1.0000 mg | INTRAVENOUS | Status: DC | PRN
  Administered 2015-06-03 (×2): 1 mg via INTRAVENOUS
  Filled 2015-06-03 (×3): qty 1

## 2015-06-03 MED ORDER — DEXTROSE 5 % IV SOLN: 1.0000 g | INTRAVENOUS | Status: DC

## 2015-06-03 MED ORDER — DEXTROSE 5 % IV SOLN: 500.0000 mg | INTRAVENOUS | Status: DC

## 2015-06-03 MED ORDER — PHENAZOPYRIDINE HCL 100 MG PO TABS
100.0000 mg | ORAL_TABLET | Freq: Three times a day (TID) | ORAL | Status: DC
  Administered 2015-06-03 (×2): 100 mg via ORAL
  Filled 2015-06-03 (×7): qty 1

## 2015-06-03 MED ORDER — ALUM & MAG HYDROXIDE-SIMETH 200-200-20 MG/5ML PO SUSP: 30.0000 mL | Freq: Four times a day (QID) | ORAL | Status: DC | PRN

## 2015-06-03 MED ORDER — DEXTROSE 5 % IV SOLN
500.0000 mg | Freq: Once | INTRAVENOUS | Status: AC
  Administered 2015-06-03: 500 mg via INTRAVENOUS
  Filled 2015-06-03: qty 500

## 2015-06-03 MED ORDER — LACTULOSE 10 GM/15ML PO SOLN
30.0000 g | Freq: Two times a day (BID) | ORAL | Status: DC
  Administered 2015-06-03: 30 g via ORAL
  Filled 2015-06-03: qty 45

## 2015-06-03 MED ORDER — PREDNISONE 5 MG/5ML PO SOLN
5.0000 mg | Freq: Every day | ORAL | Status: DC
  Administered 2015-06-03: 5 mg via ORAL
  Filled 2015-06-03 (×2): qty 5

## 2015-06-03 MED ORDER — LACTULOSE 10 GM/15ML PO SOLN: 30.0000 g | Freq: Every day | ORAL | Status: DC | PRN

## 2015-06-03 MED ORDER — BIOTENE DRY MOUTH MT LIQD: 15.0000 mL | OROMUCOSAL | Status: DC | PRN

## 2015-06-03 MED ORDER — GUAIFENESIN-DM 100-10 MG/5ML PO SYRP: 5.0000 mL | ORAL_SOLUTION | ORAL | Status: DC | PRN

## 2015-06-03 MED ORDER — FENTANYL 50 MCG/HR TD PT72: 50.0000 ug | MEDICATED_PATCH | TRANSDERMAL | Status: DC

## 2015-06-03 MED ORDER — MORPHINE SULFATE 25 MG/ML IV SOLN
1.0000 mg/h | INTRAVENOUS | Status: DC
  Administered 2015-06-03: 1 mg/h via INTRAVENOUS
  Filled 2015-06-03: qty 10

## 2015-06-03 MED ORDER — SENNOSIDES-DOCUSATE SODIUM 8.6-50 MG PO TABS: 2.0000 | ORAL_TABLET | Freq: Every day | ORAL | Status: DC

## 2015-06-03 MED ORDER — ACETAMINOPHEN 650 MG RE SUPP: 650.0000 mg | Freq: Four times a day (QID) | RECTAL | Status: DC | PRN

## 2015-06-03 MED ORDER — ACETAMINOPHEN 325 MG PO TABS: 650.0000 mg | ORAL_TABLET | Freq: Four times a day (QID) | ORAL | Status: DC | PRN

## 2015-06-03 MED ORDER — SODIUM CHLORIDE 0.9 % IV SOLN
INTRAVENOUS | Status: DC
  Administered 2015-06-03: 09:00:00 via INTRAVENOUS

## 2015-06-03 MED ORDER — DEXTROSE 5 % IV SOLN: 500.0000 mg | Freq: Once | INTRAVENOUS | Status: DC

## 2015-06-03 MED ORDER — VANCOMYCIN HCL IN DEXTROSE 1-5 GM/200ML-% IV SOLN
1000.0000 mg | Freq: Once | INTRAVENOUS | Status: AC
  Administered 2015-06-03: 1000 mg via INTRAVENOUS
  Filled 2015-06-03: qty 200

## 2015-06-03 MED ORDER — PIPERACILLIN-TAZOBACTAM 3.375 G IVPB 30 MIN
3.3750 g | Freq: Once | INTRAVENOUS | Status: AC
  Administered 2015-06-03: 3.375 g via INTRAVENOUS
  Filled 2015-06-03: qty 50

## 2015-06-03 MED ORDER — GUAIFENESIN ER 600 MG PO TB12
600.0000 mg | ORAL_TABLET | Freq: Two times a day (BID) | ORAL | Status: DC
  Administered 2015-06-03: 600 mg via ORAL
  Filled 2015-06-03: qty 1

## 2015-06-03 MED ORDER — MORPHINE SULFATE (PF) 4 MG/ML IV SOLN
4.0000 mg | Freq: Once | INTRAVENOUS | Status: AC
  Administered 2015-06-03: 4 mg via INTRAVENOUS
  Filled 2015-06-03: qty 1

## 2015-06-03 MED ORDER — SODIUM CHLORIDE 0.9 % IV BOLUS (SEPSIS)
1000.0000 mL | Freq: Once | INTRAVENOUS | Status: AC
  Administered 2015-06-03: 1000 mL via INTRAVENOUS

## 2015-06-03 MED ORDER — LORAZEPAM 2 MG/ML IJ SOLN
1.0000 mg | INTRAMUSCULAR | Status: DC | PRN
Start: 2015-06-03 — End: 2015-06-04
  Administered 2015-06-03: 2 mg via INTRAVENOUS
  Filled 2015-06-03: qty 1

## 2015-06-03 MED ORDER — POLYVINYL ALCOHOL 1.4 % OP SOLN
1.0000 [drp] | Freq: Four times a day (QID) | OPHTHALMIC | Status: DC | PRN
  Filled 2015-06-03: qty 15

## 2015-06-03 MED ORDER — PANTOPRAZOLE SODIUM 40 MG PO PACK
40.0000 mg | PACK | Freq: Every day | ORAL | Status: DC
  Administered 2015-06-03: 40 mg
  Filled 2015-06-03: qty 20

## 2015-06-03 MED ORDER — ONDANSETRON HCL 4 MG/2ML IJ SOLN
4.0000 mg | Freq: Three times a day (TID) | INTRAMUSCULAR | Status: DC
  Administered 2015-06-03: 4 mg via INTRAVENOUS
  Filled 2015-06-03: qty 2

## 2015-06-03 MED ORDER — DEXTROSE 5 % IV SOLN
1.0000 g | Freq: Once | INTRAVENOUS | Status: AC
  Administered 2015-06-03: 1 g via INTRAVENOUS
  Filled 2015-06-03: qty 10

## 2015-06-03 MED ORDER — ONDANSETRON HCL 4 MG/2ML IJ SOLN: 4.0000 mg | Freq: Four times a day (QID) | INTRAMUSCULAR | Status: DC | PRN

## 2015-06-03 MED ORDER — OMEPRAZOLE 2 MG/ML ORAL SUSPENSION: 20.0000 mg | Freq: Every day | ORAL | Status: DC

## 2015-06-03 MED ORDER — GLYCOPYRROLATE 0.2 MG/ML IJ SOLN
0.2000 mg | INTRAMUSCULAR | Status: DC
  Administered 2015-06-03 – 2015-06-04 (×3): 0.2 mg via INTRAVENOUS
  Filled 2015-06-03 (×10): qty 1

## 2015-06-03 MED ORDER — LORAZEPAM 2 MG/ML IJ SOLN
1.0000 mg | INTRAMUSCULAR | Status: DC | PRN
  Administered 2015-06-03: 1 mg via INTRAVENOUS
  Filled 2015-06-03: qty 1

## 2015-06-03 MED ORDER — GLYCOPYRROLATE 0.2 MG/ML IJ SOLN
0.2000 mg | INTRAMUSCULAR | Status: AC
  Administered 2015-06-03: 0.2 mg via INTRAVENOUS
  Filled 2015-06-03: qty 1

## 2015-06-03 MED ORDER — LEVOFLOXACIN IN D5W 750 MG/150ML IV SOLN: 750.0000 mg | INTRAVENOUS | Status: DC

## 2015-06-03 MED ORDER — MORPHINE BOLUS VIA INFUSION
1.0000 mg | INTRAVENOUS | Status: DC | PRN
  Administered 2015-06-03: 1 mg via INTRAVENOUS
  Filled 2015-06-03 (×2): qty 2

## 2015-06-03 MED ORDER — ALBUTEROL SULFATE (2.5 MG/3ML) 0.083% IN NEBU: 2.5000 mg | INHALATION_SOLUTION | RESPIRATORY_TRACT | Status: DC | PRN

## 2015-06-03 NOTE — Progress Notes (Signed)
Pharmacy Antibiotic Follow-up Note  Craig Stlouis. is a 82 y.o. year-old male with stage IV bladder cancer re-admitted on 06/05/2015.  The patient is currently on day #7 of ceftriaxone for CAP, he was discharged 2/8 but re-presented to ED 2/9am with shortness of breath and abdominal pain.  He was on ceftriaxone from 2/3 until time of discharge.  He was started back on ceftriaxone with azithromycin on 2/9 (vanco and pip/tazo given in ED) then changed to levofloxacin per pharmacy for  PNA and UTI.  His SCr is elevated (last labs 2/3).  WBC and LA both elevated.      .  Assessment/Plan:  Based on current renal functio, start levofloxacin 750mg  IV q48h, give 1st dose 24h after azithromycin d/t risk of QTc prolongation/torsades.   Temp (24hrs), Avg:97.9 F (36.6 C), Min:97.7 F (36.5 C), Max:98.1 F (36.7 C)   Recent Labs Lab 05/28/15 0545 05/29/15 0557 05/31/2015 0526  WBC 12.1* 14.1* 23.4*    Recent Labs Lab 05/28/15 0545 05/29/15 0557 06/02/2015 0535  CREATININE 1.04 1.08 2.60*   Estimated Creatinine Clearance: 25.9 mL/min (by C-G formula based on Cr of 2.6).    No Known Allergies  Antimicrobials this admission: 2/9 >> vanco/zosyn x1 ED 2/9 >> ceftriaxone >> 2/9 2/9 >> azithromycin >> 2/9 2/9 >> levofloxacin >>   Levels/dose changes this admission:  Microbiology results: 2/9 BCx: IP 2/8 UCx: to young to read   Thank you for allowing pharmacy to be a part of this patient's care.  Doreene Eland, PharmD, BCPS.   Pager: [phone] 06/05/2015 12:54 PM

## 2015-06-03 NOTE — ED Provider Notes (Signed)
CSN: [id number]     Arrival date & time 06/02/2015  0425 History   First MD Initiated Contact with Patient 06/10/2015 0451     Chief Complaint  Patient presents with  . Shortness of Breath     (Consider location/radiation/quality/duration/timing/severity/associated sxs/prior Treatment) Patient is a 82 y.o. male presenting with shortness of breath. The history is provided by the patient.  Shortness of Breath Severity:  Moderate Onset quality:  Gradual Timing:  Constant Progression:  Worsening Chronicity:  New Context: not activity and not known allergens   Relieved by:  Nothing Worsened by:  Nothing tried Ineffective treatments:  None tried Associated symptoms: abdominal pain   Associated symptoms: no chest pain, no fever, no neck pain, no PND and no swollen glands   Risk factors: no recent alcohol use and no recent surgery     History reviewed. No pertinent past medical history. Past Surgical History  Procedure Laterality Date  . Gastric ulcer  10-18-11    '91-blleding ulcer with blood transfusions after  . Appendectomy  10-18-11    age 9  . Cataract extraction, bilateral  10-18-11    bilateral   . Eye surgery  10-18-11    laser eye surgery s/p cataract bilaterally  . Cardiac catheterization  10-18-11    7-8 yrs ago-mild blockage,no tx. indicated  . Prostatectomy  10/27/2011    Procedure: PROSTATECTOMY SUPRAPUBIC;  Surgeon: Ailene Rud, MD;  Location: [facility];  Service: Urology;  Laterality: N/A;  Placement of suprapubic tube  . Cystoscopy  10/27/2011    Procedure: CYSTOSCOPY FLEXIBLE;  Surgeon: Ailene Rud, MD;  Location: [facility];  Service: Urology;  Laterality: N/A;  . Transurethral resection of bladder tumor with gyrus (turbt-gyrus)  06/2014    T1 HG tumor.  [place] Esophagogastroduodenoscopy (egd) with propofol N/A 05/20/2015    Procedure: ESOPHAGOGASTRODUODENOSCOPY (EGD) WITH PROPOFOL;  Surgeon: Ladene Artist, MD;  Location: [facility];  Service:  Endoscopy;  Laterality: N/A;  . Esophagogastroduodenoscopy (egd) with propofol N/A 05/25/2015    Procedure: ESOPHAGOGASTRODUODENOSCOPY (EGD) WITH PROPOFOL;  Surgeon: Milus Banister, MD;  Location: [facility];  Service: Endoscopy;  Laterality: N/A;  . Duodenal stent placement N/A 05/25/2015    Procedure: DUODENAL STENT PLACEMENT;  Surgeon: Milus Banister, MD;  Location: [facility];  Service: Endoscopy;  Laterality: N/A;   Family History  Problem Relation Age of Onset  . Breast cancer Sister   . Dementia Mother   . Heart attack Father   . Colon cancer Neg Hx    Social History  Substance Use Topics  . Smoking status: Former Smoker -- 1.00 packs/day for 5 years    Types: Cigarettes    Quit date: 09/26/1978  . Smokeless tobacco: Current User    Types: Chew    Last Attempt to Quit: 11/01/2011  . Alcohol Use: No    Review of Systems  Constitutional: Negative for fever.  Respiratory: Positive for shortness of breath.   Cardiovascular: Positive for leg swelling. Negative for chest pain, palpitations and PND.  Gastrointestinal: Positive for abdominal pain.  Musculoskeletal: Negative for neck pain.  All other systems reviewed and are negative.     Allergies  Review of patient's allergies indicates no known allergies.  Home Medications   Prior to Admission medications   Medication Sig Start Date End Date Taking? Authorizing Provider  diltiazem (CARDIZEM) 10 mg/ml oral suspension Take 3 mLs (30 mg total) by mouth 4 (four) times daily. 06/02/15  Yes Noah Delaine  Short, MD  feeding supplement, ENSURE ENLIVE, (ENSURE ENLIVE) LIQD Take 237 mLs by mouth 2 (two) times daily between meals. 06/02/15  Yes Janece Canterbury, MD  fentaNYL (DURAGESIC - DOSED MCG/HR) 50 MCG/HR Place 1 patch (50 mcg total) onto the skin every 3 (three) days. 06/02/15  Yes Janece Canterbury, MD  haloperidol (HALDOL) 2 MG/ML solution Take 0.5 mLs (1 mg total) by mouth every 6 (six) hours as needed for agitation  (hallucination). 06/02/15  Yes Janece Canterbury, MD  lactulose (CHRONULAC) 10 GM/15ML solution Take 45 mLs (30 g total) by mouth 2 (two) times daily. 06/19/2015  Yes Janece Canterbury, MD  Morphine Sulfate (MORPHINE CONCENTRATE) 10 MG/0.5ML SOLN concentrated solution Take 0.5 mLs (10 mg total) by mouth every 30 (thirty) minutes as needed for severe pain. 06/02/15  Yes Janece Canterbury, MD  omeprazole (PRILOSEC) 2 mg/mL SUSP Take 10 mLs (20 mg total) by mouth daily. 06/02/15  Yes Janece Canterbury, MD  ondansetron (ZOFRAN-ODT) 4 MG disintegrating tablet Take 1 tablet (4 mg total) by mouth every 8 (eight) hours. 06/02/15  Yes Janece Canterbury, MD  phenazopyridine (PYRIDIUM) 100 MG tablet Take 1 tablet (100 mg total) by mouth 3 (three) times daily with meals. 06/02/15  Yes Janece Canterbury, MD  predniSONE 5 MG/5ML solution Take 5 mLs (5 mg total) by mouth daily with breakfast. 06/02/15  Yes Janece Canterbury, MD  sennosides-docusate sodium (SENOKOT-S) 8.6-50 MG tablet Take 2 tablets by mouth at bedtime.   Yes Historical Provider, MD   BP 110/69 mmHg  Pulse 102  Temp(Src) 97.7 F (36.5 C) (Axillary)  Resp 16  SpO2 94% Physical Exam  Constitutional: He appears well-developed.  HENT:  Head: Normocephalic and atraumatic.  Mouth/Throat: Oropharynx is clear and moist.  Eyes: Conjunctivae are normal. Pupils are equal, round, and reactive to light.  Neck: Normal range of motion. Neck supple.  Cardiovascular: Normal rate, regular rhythm and intact distal pulses.   Pulmonary/Chest: No stridor. No respiratory distress. He has rales. He exhibits no tenderness.  Abdominal: Soft. Bowel sounds are normal. He exhibits distension. There is no tenderness. There is no rebound.  Musculoskeletal: He exhibits edema.  Neurological: He is alert. He has normal reflexes.  Skin: He is not diaphoretic.  Psychiatric: He has a normal mood and affect.    ED Course  Procedures (including critical care time) Labs Review Labs Reviewed  CBC  WITH DIFFERENTIAL/PLATELET - Abnormal; Notable for the following:    WBC 23.4 (*)    RDW 16.3 (*)    Neutro Abs 21.8 (*)    All other components within normal limits  BLOOD GAS, ARTERIAL - Abnormal; Notable for the following:    pH, Arterial 7.298 (*)    pCO2 arterial 29.5 (*)    pO2, Arterial 77.5 (*)    Bicarbonate 14.0 (*)    Acid-base deficit 10.9 (*)    All other components within normal limits  I-STAT CHEM 8, ED - Abnormal; Notable for the following:    BUN 38 (*)    Creatinine, Ser 2.60 (*)    Glucose, Bld 126 (*)    Calcium, Ion 1.10 (*)    All other components within normal limits  I-STAT TROPOININ, ED - Abnormal; Notable for the following:    Troponin i, poc 0.09 (*)    All other components within normal limits  I-STAT CG4 LACTIC ACID, ED - Abnormal; Notable for the following:    Lactic Acid, Venous 5.04 (*)    All other components within normal limits  CULTURE, BLOOD (ROUTINE X 2)  CULTURE, BLOOD (ROUTINE X 2)  URINALYSIS, ROUTINE W REFLEX MICROSCOPIC (NOT AT [facility])    Imaging Review US Abdomen Limited  06/01/2015  CLINICAL DATA:  Abdominal distention.  Clinical concern for ascites. EXAM: LIMITED ABDOMEN ULTRASOUND FOR ASCITES TECHNIQUE: Limited ultrasound survey for ascites was performed in all four abdominal quadrants. COMPARISON:  Abdomen and pelvis CT dated 03/27/2016. FINDINGS: Survey of the 4 quadrants of the abdomen demonstrated no free peritoneal fluid. IMPRESSION: No free peritoneal fluid. Electronically Signed   By: Claudie Revering M.D.   On: 06/01/2015 17:53   Dg Chest Portable 1 View  06/16/2015  CLINICAL DATA:  Acute onset of worsening shortness of breath. Decreased O2 saturation. Initial encounter. EXAM: PORTABLE CHEST 1 VIEW COMPARISON:  Chest radiograph from 04/05/2015 FINDINGS: The lungs are hypoexpanded. There is mild elevation of the right hemidiaphragm. Vascular congestion is noted. Mild bibasilar opacities may reflect pneumonia, given the patient's  symptoms. There is no evidence of pleural effusion or pneumothorax. The cardiomediastinal silhouette is within normal limits. No acute osseous abnormalities are seen. IMPRESSION: Lungs hypoexpanded. Mild elevation of the right hemidiaphragm. Vascular congestion seen. Mild bibasilar opacities may reflect pneumonia, given the patient's symptoms. Electronically Signed   By: Garald Balding M.D.   On: 06/02/2015 05:49   I have personally reviewed and evaluated these images and lab results as part of my medical decision-making.   EKG Interpretation   Date/Time:  Thursday June 03 2015 04:47:02 EST Ventricular Rate:  110 PR Interval:  187 QRS Duration: 93 QT Interval:  340 QTC Calculation: 460 R Axis:   9 Text Interpretation:  Sinus tachycardia Confirmed by [facility]  MD,  Levetta Bognar ([id number]) on 06/10/2015 4:50:36 AM      MDM   Final diagnoses:  HCAP (healthcare-associated pneumonia)  SIRS (systemic inflammatory response syndrome) (HCC)  UTI (lower urinary tract infection)    Medications  sodium chloride 0.9 % bolus 1,000 mL (0 mLs Intravenous Stopped 06/11/2015 0701)  vancomycin (VANCOCIN) IVPB 1000 mg/200 mL premix (1,000 mg Intravenous New Bag/Given 06/02/2015 0603)  cefTRIAXone (ROCEPHIN) 1 g in dextrose 5 % 50 mL IVPB (0 g Intravenous Stopped 06/12/2015 0633)  sodium chloride 0.9 % bolus 1,000 mL (1,000 mLs Intravenous New Bag/Given 05/27/2015 0603)  piperacillin-tazobactam (ZOSYN) IVPB 3.375 g (3.375 g Intravenous New Bag/Given 05/26/2015 0627)   Results for orders placed or performed during the hospital encounter of 06/16/2015  CBC with Differential/Platelet  Result Value Ref Range   WBC 23.4 (H) 4.0 - 10.5 K/uL   RBC 4.74 4.22 - 5.81 MIL/uL   Hemoglobin 13.5 13.0 - 17.0 g/dL   HCT 43.2 39.0 - 52.0 %   MCV 91.1 78.0 - 100.0 fL   MCH 28.5 26.0 - 34.0 pg   MCHC 31.3 30.0 - 36.0 g/dL   RDW 16.3 (H) 11.5 - 15.5 %   Platelets 218 150 - 400 K/uL   Neutrophils Relative % 94 %   Neutro Abs 21.8 (H)  1.7 - 7.7 K/uL   Lymphocytes Relative 4 %   Lymphs Abs 1.0 0.7 - 4.0 K/uL   Monocytes Relative 2 %   Monocytes Absolute 0.6 0.1 - 1.0 K/uL   Eosinophils Relative 0 %   Eosinophils Absolute 0.0 0.0 - 0.7 K/uL   Basophils Relative 0 %   Basophils Absolute 0.0 0.0 - 0.1 K/uL  Blood gas, arterial ([facility] ONLY)  Result Value Ref Range   FIO2 1.00    Delivery systems NON-REBREATHER  OXYGEN MASK    pH, Arterial 7.298 (L) 7.350 - 7.450   pCO2 arterial 29.5 (L) 35.0 - 45.0 mmHg   pO2, Arterial 77.5 (L) 80.0 - 100.0 mmHg   Bicarbonate 14.0 (L) 20.0 - 24.0 mEq/L   TCO2 13.0 0 - 100 mmol/L   Acid-base deficit 10.9 (H) 0.0 - 2.0 mmol/L   O2 Saturation 93.7 %   Patient temperature 37.0    Collection site LEFT RADIAL    Drawn by [id number]    Sample type ARTERIAL DRAW    Allens test (pass/fail) PASS PASS  I-Stat Chem 8, ED  Result Value Ref Range   Sodium 136 135 - 145 mmol/L   Potassium 4.2 3.5 - 5.1 mmol/L   Chloride 103 101 - 111 mmol/L   BUN 38 (H) 6 - 20 mg/dL   Creatinine, Ser 2.60 (H) 0.61 - 1.24 mg/dL   Glucose, Bld 126 (H) 65 - 99 mg/dL   Calcium, Ion 1.10 (L) 1.13 - 1.30 mmol/L   TCO2 18 0 - 100 mmol/L   Hemoglobin 15.6 13.0 - 17.0 g/dL   HCT 46.0 39.0 - 52.0 %  I-stat troponin, ED  Result Value Ref Range   Troponin i, poc 0.09 (HH) 0.00 - 0.08 ng/mL   Comment NOTIFIED PHYSICIAN    Comment 3          I-Stat CG4 Lactic Acid, ED  Result Value Ref Range   Lactic Acid, Venous 5.04 (HH) 0.5 - 2.0 mmol/L   Comment NOTIFIED PHYSICIAN    Ct Abdomen Pelvis Wo Contrast  05/07/2015  CLINICAL DATA:  Stated history of small bowel obstruction. Patient reports no bowel movement for 2 days, has not passed gas today. Hematuria. EXAM: CT ABDOMEN AND PELVIS WITHOUT CONTRAST TECHNIQUE: Multidetector CT imaging of the abdomen and pelvis was performed following the standard protocol without IV contrast. COMPARISON:  CT 03/14/2015 FINDINGS: Lower chest: Elevation of right hemidiaphragm with adjacent  compressive atelectasis in the right middle lobe. Coronary artery calcifications are seen. Liver: Hypodense subcapsular liver lesion involving the right lobe with likely increased in size from prior exam, currently 5.4 cm, previously 4.2 cm. Detailed hepatic evaluation limited secondary to lack of intravenous contrast. Hepatobiliary: Gallbladder physiologically distended, no calcified stone. No biliary dilatation. Pancreas: Fatty atrophy, no ductal dilatation. Spleen: Unremarkable noncontrast appearance. Adrenal glands: No nodule. Kidneys: Left ureteral stent in place. 24mm stone is seen adjacent to the stent in the left distal ureter, similar in position to prior. The previous left nephrostomy tube is no longer seen. There is mild dilatation of the renal pelvis and proximal ureter despite stent placement. Mild right hydroureteronephrosis is similar to prior, with ureteral dilatation throughout its course hypodense lesions within the right kidney, likely cysts. Stomach/Bowel: Stomach mildly distended with ingested contrast. There are no dilated or thickened small bowel loops. Small bowel loops are decompressed. Moderate stool in the right and transverse colon, small volume of stool in the descending and rectosigmoid colon. Equivocal colonic wall thickening involving the distal sigmoid. Appendix is not seen, surgically absent. Vascular/Lymphatic: Small retroperitoneal lymph nodes, not enlarged by size criteria. Left pelvic sidewall node, 10 mm short axis. Abdominal aorta is normal in caliber. Moderate atherosclerosis without aneurysm. An IVC filter is in place. Reproductive: Post prostatectomy per report. Bladder: Marked wall irregularity and thickening. Minimal urine in the bladder lumen. Perivesicular inflammatory change, mildly increased in the interim. Other: No free air, free fluid, or intra-abdominal fluid collection. Fat within both inguinal canals. Musculoskeletal: There are  no acute or suspicious osseous  abnormalities. Stable degenerative change in the spine. Anterolisthesis of L3 on L4 with associated degenerative change. No destructive lytic or blastic osseous lesion. IMPRESSION: 1. No bowel obstruction. Low moderate stool burden in the right and transverse colon, with decompressed distal colon. Equivocal wall thickening of the distal sigmoid colon, may reflect minimal colitis in the appropriate clinical setting. 2. Left ureteral stent with minimal residual hydronephrosis and proximal ureter. 5 mm stone in the distal left ureter adjacent to the stent is seen. Unchanged right hydroureteronephrosis. 3. Diffuse bladder wall thickening and irregularity, with minimal increased thin perivesicular inflammation from prior. Small left pelvic lymph node is unchanged, concerning for nodal disease. 4. Right lobe hepatic lesion has minimally increased in size in the interim, concerning for progression of metastatic disease. Electronically Signed   By: Jeb Levering M.D.   On: 05/07/2015 01:24   Dg Abd 1 View - Kub  05/25/2015  CLINICAL DATA:  Duodenal stent placement EXAM: ABDOMEN - 1 VIEW COMPARISON:  05/18/2015, 05/21/2015 FLUOROSCOPY TIME:  Radiation Exposure Index (as provided by the fluoroscopic device): 57.37 mGy If the device does not provide the exposure index: Fluoroscopy Time:  3 minutes 6 seconds Number of Acquired Images:  6 FINDINGS: Initial images demonstrate a a endoscope in second third portion of the duodenum. A wire was placed and subsequently and expandable stent was placed across narrowed area within the duodenum. Final image demonstrates stent significantly narrowed by the underlying mass lesion. IMPRESSION: Duodenal stent placement as described. Electronically Signed   By: Inez Catalina M.D.   On: 05/25/2015 15:25   US Scrotum  05/07/2015  CLINICAL DATA:  Bilateral testicular tenderness.  Chronic pain. EXAM: SCROTAL ULTRASOUND DOPPLER ULTRASOUND OF THE TESTICLES TECHNIQUE: Complete ultrasound  examination of the testicles, epididymis, and other scrotal structures was performed. Color and spectral Doppler ultrasound were also utilized to evaluate blood flow to the testicles. COMPARISON:  None. FINDINGS: Right testicle Measurements: 4.4 x 2.6 x 2.7 cm. No mass or microlithiasis visualized. Left testicle Measurements: 3.8 x 2.7 x 2.8 cm. No mass or microlithiasis visualized. Right epididymis:  Normal in size and appearance. Left epididymis: Small cyst in the epididymal tail measuring 3.4 mm. Normal in size and appearance. Hydrocele:  None visualized. Varicocele:  Mild on the left. Pulsed Doppler interrogation of both testes demonstrates normal low resistance arterial and venous waveforms bilaterally. IMPRESSION: 1. Normal sonographic appearance of the testicles. 2. Small left varicocele.  Tiny left epididymal head cyst. Electronically Signed   By: Jeb Levering M.D.   On: 05/07/2015 02:01   Ct Abdomen Pelvis W Contrast  05/18/2015  CLINICAL DATA:  Right and left lower abdominal pain. Nausea and vomiting since Friday. Leukocytosis. History of hiatal hernia, gastric ulcer, appendectomy, diverticulitis, prostatectomy, bladder cancer, bilateral nephrostomies. EXAM: CT ABDOMEN AND PELVIS WITH CONTRAST TECHNIQUE: Multidetector CT imaging of the abdomen and pelvis was performed using the standard protocol following bolus administration of intravenous contrast. CONTRAST:  58mL OMNIPAQUE IOHEXOL 300 MG/ML  SOLN COMPARISON:  05/06/2015 FINDINGS: The lung bases are clear. Coronary artery and aortic calcifications. There is a heterogeneously enhancing hypoechoic solid mass in the right lobe of the liver inferiorly measuring about 6.8 cm diameter. This was present previously but is better defined on the study with IV contrast material. Appearance is consistent with metastasis. Gallbladder and bile ducts appear normal. Pancreas, spleen, and adrenal glands are unremarkable. Calcification of the abdominal aorta  without aneurysm. Inferior vena caval filter is present.  Kidneys demonstrate multiple cysts bilaterally. Bilateral nephrostomy tubes are present with additional left ureteral tube. Renal collecting systems are decompressed. There is scarring or infiltration in the fat of the retroperitoneum surrounding the ureters more prominent on the right. There is extension to the inferior uncinate process of the pancreas. This could represent retroperitoneal fibrosis or neoplastic or inflammatory stranding. Appearances are similar to previous study. The stomach is distended with decompression of the duodenum past the second portion. This is associated with a area of retroperitoneal scarring or infiltration. Gastric outlet obstruction should be excluded. Small bowel and colon are decompressed. No gastric wall thickening appreciated. No free air or free fluid in the abdomen. Abdominal wall musculature appears intact. Pelvis: Bladder is decompressed. There is diffuse asymmetric bladder wall thickening, likely neoplastic. There is loss of fat plane between the anterior bladder wall and the anterior pelvic wall. This could represent direct tumor extension or scarring from previous suprapubic catheter. No significant lymphadenopathy in the pelvis. There appears to been trans urethral resection of the prostate gland. Seminal vesicles are present without enlargement. Degenerative changes in the spine. Slight anterior subluxation of L3 on L4 with associated spondylolysis. No destructive bone lesions. IMPRESSION: There appears to be gastric outlet obstruction with distended fluid-filled stomach to the level of the second portion the duodenum. There is retroperitoneal infiltration or scarring which may be causing gastric outlet obstruction. Duodenal mass or inflammation not excluded. Diffuse retroperitoneal inflammation or scarring most prominent on the right extending down to the pelvis. Bilateral percutaneous nephrostomy tubes and left  ureteral tube. Diffuse asymmetric bladder wall thickening, likely neoplastic. Loss of distinction between the anterior bladder wall and anterior abdominal wall suggests direct infiltration versus scarring. Metastatic lesion in the right lobe of the liver appears unchanged since prior study. Electronically Signed   By: Lucienne Capers M.D.   On: 05/18/2015 23:31   US Abdomen Limited  06/01/2015  CLINICAL DATA:  Abdominal distention.  Clinical concern for ascites. EXAM: LIMITED ABDOMEN ULTRASOUND FOR ASCITES TECHNIQUE: Limited ultrasound survey for ascites was performed in all four abdominal quadrants. COMPARISON:  Abdomen and pelvis CT dated 03/27/2016. FINDINGS: Survey of the 4 quadrants of the abdomen demonstrated no free peritoneal fluid. IMPRESSION: No free peritoneal fluid. Electronically Signed   By: Claudie Revering M.D.   On: 06/01/2015 17:53   [place] Art/ven Flow Abd Pelv Doppler  05/07/2015  CLINICAL DATA:  Bilateral testicular tenderness.  Chronic pain. EXAM: SCROTAL ULTRASOUND DOPPLER ULTRASOUND OF THE TESTICLES TECHNIQUE: Complete ultrasound examination of the testicles, epididymis, and other scrotal structures was performed. Color and spectral Doppler ultrasound were also utilized to evaluate blood flow to the testicles. COMPARISON:  None. FINDINGS: Right testicle Measurements: 4.4 x 2.6 x 2.7 cm. No mass or microlithiasis visualized. Left testicle Measurements: 3.8 x 2.7 x 2.8 cm. No mass or microlithiasis visualized. Right epididymis:  Normal in size and appearance. Left epididymis: Small cyst in the epididymal tail measuring 3.4 mm. Normal in size and appearance. Hydrocele:  None visualized. Varicocele:  Mild on the left. Pulsed Doppler interrogation of both testes demonstrates normal low resistance arterial and venous waveforms bilaterally. IMPRESSION: 1. Normal sonographic appearance of the testicles. 2. Small left varicocele.  Tiny left epididymal head cyst. Electronically Signed   By: Jeb Levering M.D.   On: 05/07/2015 02:01   Dg Chest Portable 1 View  05/27/2015  CLINICAL DATA:  Acute onset of worsening shortness of breath. Decreased O2 saturation. Initial encounter. EXAM: PORTABLE CHEST 1 VIEW COMPARISON:  Chest radiograph from 04/05/2015 FINDINGS: The lungs are hypoexpanded. There is mild elevation of the right hemidiaphragm. Vascular congestion is noted. Mild bibasilar opacities may reflect pneumonia, given the patient's symptoms. There is no evidence of pleural effusion or pneumothorax. The cardiomediastinal silhouette is within normal limits. No acute osseous abnormalities are seen. IMPRESSION: Lungs hypoexpanded. Mild elevation of the right hemidiaphragm. Vascular congestion seen. Mild bibasilar opacities may reflect pneumonia, given the patient's symptoms. Electronically Signed   By: Garald Balding M.D.   On: 05/30/2015 05:49   Dg Ugi W/high Density W/kub  05/21/2015  CLINICAL DATA:  Duodenal obstruction.  Metastatic bladder cancer. EXAM: UPPER GI SERIES WITHOUT KUB TECHNIQUE: Routine upper GI series was performed with thin barium and water-soluble contrast. FLUOROSCOPY TIME:  Fluoroscopy Time (in minutes and seconds): 3 minutes 44 seconds COMPARISON:  CT scan dated 05/18/2015 FINDINGS: Thin barium was instilled in the duodenal ball of the via the indwelling nasogastric tube. There was slow passage of contrast into the distal second portion of the duodenum and gradually into the third portion of the duodenum. With small amount water-soluble contrast was also introduced. After 8 minutes contrast had passed through the duodenum to just beyond the ligament of Treitz. There is a marked narrowing of the third portion of the duodenum over a ~3.5-4.0 cm segment. There is slight narrowing of second portion of the duodenum over the ~4 cm proximal to the severe narrowing. The fourth portion of the duodenum the proximal jejunum beyond the ligament of Treitz appear normal. The visualized portion of  the stomach and pylorus and duodenal bulb are normal. IMPRESSION: 7.5-8 cm long segment of narrowing of the second and third portions of the duodenum. The most severe area of narrowing is in the third portion of the duodenum over a 3.5-4 cm segment. Electronically Signed   By: Lorriane Shire M.D.   On: 05/21/2015 11:24   Dg C-arm 1-60 Min-no Report  05/25/2015  CLINICAL DATA: dueodenal stricture C-ARM 1-60 MINUTES Fluoroscopy was utilized by the requesting physician.  No radiographic interpretation.   Ir Nephrostogram Left Thru Existing Access  05/27/2015  INDICATION: Recently placed bilateral percutaneous nephrostomy tubes on 05/08/2015. Tube exit sites are painful. Evaluate for proper positioning of nephrostomy tubes. EXAM: IR NEPHROSTOGRAM EXISTING ACCESS LEFT; IR NEPHROSTOGRAM EXISTING ACCESS RIGHT COMPARISON:  Images at the time of percutaneous nephrostomy placement 05/08/2015 MEDICATIONS: None ANESTHESIA/SEDATION: None CONTRAST:  20 mL Omnipaque 300 - administered into the collecting system(s) FLUOROSCOPY TIME:  Fluoroscopy Time: 0 minutes 6 seconds (1 mGy). COMPLICATIONS: None immediate. Estimated blood loss: None PROCEDURE: Informed written consent was obtained from the patient after a thorough discussion of the procedural risks, benefits and alternatives. All questions were addressed. A timeout was performed prior to the initiation of the procedure. Antegrade nephrostogram spur performed through first the right, and then the left percutaneous nephrostomy tubes. Both tubes are well positioned within the renal pelves. There is a right-sided double-J ureteral stent. No hydronephrosis. Examination of the skin exit sites demonstrates some mild irritation and scabbing at the site of the retention sutures. The retention sutures were cut and removed. Adhesive fixation devices were applied bilaterally. IMPRESSION: 1. Well-positioned and normally functioning percutaneous nephrostomy tubes bilaterally. 2. Patient  discomfort appears to be arising from mild irritation and scab formation at the site of the retention sutures. The retention sutures were removed and adhesive fixation devices applied. Return to interventional radiology in 4 weeks for routine nephrostomy tube exchanges. Signed, Criselda Peaches, MD [facility] Radiology Electronically Signed   By: Jacqulynn Cadet M.D.   On: 05/27/2015 16:57   Ir Nephrostogram Right Thru Existing Access  05/27/2015  INDICATION: Recently placed bilateral percutaneous nephrostomy tubes on 05/08/2015. Tube exit sites are painful. Evaluate for proper positioning of nephrostomy tubes. EXAM: IR NEPHROSTOGRAM EXISTING ACCESS LEFT; IR NEPHROSTOGRAM EXISTING ACCESS RIGHT COMPARISON:  Images at the time of percutaneous nephrostomy placement 05/08/2015 MEDICATIONS: None ANESTHESIA/SEDATION: None CONTRAST:  20 mL Omnipaque 300 - administered into the collecting system(s) FLUOROSCOPY TIME:  Fluoroscopy Time: 0 minutes 6 seconds (1 mGy). COMPLICATIONS: None immediate. Estimated blood loss: None PROCEDURE: Informed written consent was obtained from the patient after a thorough discussion of the procedural risks, benefits and alternatives. All questions were addressed. A timeout was performed prior to the initiation of the procedure. Antegrade nephrostogram spur performed through first the right, and then the left percutaneous nephrostomy tubes. Both tubes are well positioned within the renal pelves. There is a right-sided double-J ureteral stent. No hydronephrosis. Examination of the skin exit sites demonstrates some mild irritation and scabbing at the site of the retention sutures. The retention sutures were cut and removed. Adhesive fixation devices were applied bilaterally. IMPRESSION: 1. Well-positioned and normally functioning percutaneous nephrostomy tubes bilaterally. 2. Patient discomfort appears to be arising from mild irritation and  scab formation at the site of the retention sutures. The retention sutures were removed and adhesive fixation devices applied. Return to interventional radiology in 4 weeks for routine nephrostomy tube exchanges. Signed, Criselda Peaches, MD [facility] Radiology Electronically Signed   By: Jacqulynn Cadet M.D.   On: 05/27/2015 16:57   Ir Nephrostomy Placement Left  05/09/2015  CLINICAL DATA:  Anuric EXAM: IR NEPHROSTOMY PLACEMENT LEFT; IR NEPHROSTOMY PLACEMENT RIGHT FLUOROSCOPY TIME:  Three minutes ANESTHESIA/SEDATION: Moderate sedation time: 30 minutes CONTRAST:  10 cc Omnipaque 300 PROCEDURE: The procedure, risks, benefits, and alternatives were explained to the patient. Questions regarding the procedure were encouraged and answered. The patient understands and consents to the procedure. The back was prepped with ChloraPrep in a sterile fashion, and a sterile drape was applied covering the operative field. A sterile gown and sterile gloves were used for the procedure. Under sonographic guidance, a 21 gauge needle was inserted into a posterior lower pole calyx of the right kidney. Contrast was injected opacifying the collecting system. The needle was removed over a 018 wire which was up sized to a 3 J. A 10 French dilator followed by a 10 [place] nephrostomy or advanced over the wire. It was looped and string fixed within the renal pelvis. It was sewn to the skin. Contrast was injected. Under sonographic guidance, a 21 gauge needle was advanced into the left renal pelvis. Contrast and gas were injected. A second needle was then advanced into the lower pole calyx and removed over a 018 wire which was up sized to a 3 J. A 10 French dilator followed by a 10 [place] drain were inserted. It was looped and string fixed in the renal pelvis. Contrast was injected. FINDINGS: Imaging documents access into both lower pole collecting systems via 21 gauge needles. Contrast  fills the renal collecting systems bilaterally demonstrating hydronephrosis an ureteral obstruction. Final imaging demonstrates bilateral 10 French nephrostomy catheters. COMPLICATIONS: None IMPRESSION: Successful bilateral nephrostomy catheter placement for ureteral obstruction. Electronically Signed   By: Marybelle Killings M.D.   On: 05/09/2015 08:11   Ir Nephrostomy Placement Right  05/09/2015  CLINICAL DATA:  Anuric EXAM: IR NEPHROSTOMY PLACEMENT LEFT; IR NEPHROSTOMY PLACEMENT RIGHT FLUOROSCOPY TIME:  Three minutes ANESTHESIA/SEDATION: Moderate sedation time: 30 minutes CONTRAST:  10 cc Omnipaque 300 PROCEDURE: The procedure, risks, benefits, and alternatives were explained to the patient. Questions regarding the procedure were encouraged and answered. The patient understands and consents to the procedure. The back was prepped with ChloraPrep in a sterile fashion, and a sterile drape was applied covering the operative field. A sterile gown and sterile gloves were used for the procedure. Under sonographic guidance, a 21 gauge needle was inserted into a posterior lower pole calyx of the right kidney. Contrast was injected opacifying the collecting system. The needle was removed over a 018 wire which was up sized to a 3 J. A 10 French dilator followed by a 10 [place] nephrostomy or advanced over the wire. It was looped and string fixed within the renal pelvis. It was sewn to the skin. Contrast was injected. Under sonographic guidance, a 21 gauge needle was advanced into the left renal pelvis. Contrast and gas were injected. A second needle was then advanced into the lower pole calyx and removed over a 018 wire which was up sized to a 3 J. A 10 French dilator followed by a 10 [place] drain were inserted. It was looped and string fixed in the renal pelvis. Contrast was injected. FINDINGS: Imaging documents access into both lower pole collecting systems via 21 gauge needles. Contrast fills the renal collecting systems  bilaterally demonstrating hydronephrosis an ureteral obstruction. Final imaging demonstrates bilateral 10 French nephrostomy catheters. COMPLICATIONS: None IMPRESSION: Successful bilateral nephrostomy catheter placement for ureteral obstruction. Electronically Signed   By: Marybelle Killings M.D.   On: 05/09/2015 08:11    MDM Reviewed: previous chart, nursing note and vitals Reviewed previous: labs and ECG Interpretation: labs, ECG and x-ray (elevated white count and creatinine PNA on CXR by me UTI on urine) Total time providing critical care: 30-74 minutes. This excludes time spent performing separately reportable procedures and services. Consults: admitting MD     CRITICAL CARE Performed by: Carlisle Beers Total critical care time: 60  minutes Critical care time was exclusive of separately billable procedures and treating other patients. Critical care was necessary to treat or prevent imminent or life-threatening deterioration. Critical care was time spent personally by me on the following activities: development of treatment plan with patient and/or surrogate as well as nursing, discussions with consultants, evaluation of patient's response to treatment, examination of patient, obtaining history from patient or surrogate, ordering and performing treatments and interventions, ordering and review of laboratory studies, ordering and review of radiographic studies, pulse oximetry and re-evaluation of patient's condition.  Veatrice Kells, MD 06/12/2015 [phone]

## 2015-06-03 NOTE — Plan of Care (Signed)
82 year old male with history of stage IV urothelial cancer under hospice was brought to the ER after patient was complaining of shortness of breath and abdominal pain. In the ER patient required 100% nonrebreather. Chest x-ray was showing infiltrates and patient also had acute renal failure. At this time as per the ER physician Dr. Nicholes Stairs patient's family has requested to continue with fluids and antibiotics but otherwise DNR/DNI and comfort measures. Patient will be admitted to [place] floor.  Gean Birchwood

## 2015-06-03 NOTE — Progress Notes (Signed)
Nutrition Brief Note  Chart reviewed. Pt now transitioning to comfort care.  No further nutrition interventions warranted at this time.  Please consult as needed.   Jarris Kortz, MS, RD, LDN Pager: [phone] After Hours Pager: [phone]    

## 2015-06-03 NOTE — Progress Notes (Signed)
PHARMACY NOTE -  ANTIBIOTIC RENAL DOSE ADJUSTMENT   Request received for Pharmacy to assist with antibiotic renal dose adjustment.  Patient has been initiated on ceftriaxone/azithromycin for CAP/sepsis. SCr 2.6 , estimated CrCl 22 ml/min/1.38m2 (normalized)  Antimicrobials this admission: 2/9 >> Zosyn/Vancomycin x 1 2/9 >> Ceftriaxone x7d >> 2/9 >> Azithromycin >>  Microbiology Results: 2/9 BCx: sent 2/8 UA: many bacteria, (+) nitrite, small leukocytes, yeast  Current dosage is appropriate and need for further dosage adjustment appears unlikely at present. Will sign off at this time.  Please reconsult if a change in clinical status warrants re-evaluation of dosage.  Peggyann Juba, PharmD, BCPS Pager: [phone] 06/16/2015 8:15 AM

## 2015-06-03 NOTE — Progress Notes (Signed)
Inpatient [facility] [place] RN Visit GIP related admission to [facility] DX of Bladder Cancer, pt. Is a DNR code status. Patient seen in room dozing with hob elevated. Resps regular and O2 at 2 L [place]. Patient has 3 family members at the bedside. Patient was admitted to [facility] last night. His Left nephrostomy tube fell out early this morning and he became very short of breath. Family decided to call EMS for transport back to the hospital. Patient awakens to his name and answers questions appropriately. Pt. Has a Morphine infusion at 1mg /hr per Left periphral IV. Staff RN reports she is unsure of the plan for the nephrostomy tube at present. [facility] will continue to monitor daily.Please call with any questions.  Med list and transfer summary placed on shadow chart.   [facility] Liaison' [phone]

## 2015-06-03 NOTE — Progress Notes (Signed)
Daily Progress Note   Patient Name: Craig Waters.       Date: 06/05/2015 DOB: 1931/01/14  Age: 82 y.o. MRN#: [id number] Attending Physician: Venetia Maxon Rama, MD Primary Care Physician: [name], MD Admit Date: 06/05/2015  Reason for Consultation/Follow-up: Establishing goals of care  Subjective: I spoke with daughter, [name], at bedside today. She is tearful and they unfortunately had a bad experience upon transition to home. She explains that she was unable to get all the medications to ensure him comfort yesterday when they went home and that he became very short of breath and symptoms became significantly worse. She had EMS out twice to the home and hospice was arranging for oxygen but that could not come until tomorrow. Lastly when they were trying to assist him on/off the commode his left nephrostomy was pulled out and this is when [name] decided to bring him back to the hospital. I expressed my deep concern and regret that his small time at home was so stressful and difficult. I had explained to them yesterday that I anticipated he would need more frequent doses of roxanol likely through the night while transitioning to fentanyl patch. Emotional support provided to patient/family.   Addendum: I came back by and his niece is at bedside. He is much changed and mottling up his feet much more severe than earlier today and feet cold to touch. Breathing is more irregular and lips cyanotic with increased congestion. Adjusted symptom management. Likely hours now and explained to niece that I would not be surprised if he died 28. Goal is comfort and she is calling daughter, [name], back to bedside.   Length of Stay: 0 days  Current Medications: Scheduled Meds:  . [START ON  07/01/15] azithromycin  500 mg Intravenous Q24H  . azithromycin  500 mg Intravenous Once  . [START ON Jul 01, 2015] cefTRIAXone (ROCEPHIN)  IV  1 g Intravenous Q24H  . feeding supplement (ENSURE ENLIVE)  237 mL Oral BID BM  . guaiFENesin  600 mg Oral BID  . lactulose  30 g Oral BID  . ondansetron (ZOFRAN) IV  4 mg Intravenous 3 times per day   Or  . ondansetron  4 mg Oral 3 times per day  . pantoprazole sodium  40 mg Per Tube Daily  . phenazopyridine  100 mg  Oral TID WC  . predniSONE  5 mg Oral Q breakfast  . senna-docusate  2 tablet Oral QHS    Continuous Infusions: . sodium chloride 100 mL/hr at 06/18/2015 0830  . morphine 1 mg/hr (05/26/2015 0958)    PRN Meds: acetaminophen **OR** acetaminophen, albuterol, alum & mag hydroxide-simeth, antiseptic oral rinse, glycopyrrolate, guaiFENesin-dextromethorphan, haloperidol, lactulose, morphine, ondansetron **OR** ondansetron (ZOFRAN) IV, polyvinyl alcohol  Physical Exam: Physical Exam  Constitutional: He appears well-developed. He appears lethargic.  HENT:  Head: Normocephalic and atraumatic.  Cardiovascular: Tachycardia present.   Toes mottled/cyanotic  Pulmonary/Chest: Effort normal. No accessory muscle usage. No tachypnea. No respiratory distress. He has rhonchi.  Abdominal: He exhibits distension.  Neurological: He appears lethargic.                Vital Signs: BP 92/50 mmHg  Pulse 98  Temp(Src) 97.7 F (36.5 C) (Axillary)  Resp 16  Ht 5\' 11"  (1.803 m)  Wt 95.8 kg (211 lb 3.2 oz)  BMI 29.47 kg/m2  SpO2 96% SpO2: SpO2: 96 % O2 Device: O2 Device: Nasal Cannula O2 Flow Rate: O2 Flow Rate (L/min): 2 L/min  Intake/output summary: No intake or output data in the 24 hours ending 06/20/2015 1052 LBM:   Baseline Weight: Weight: 95.8 kg (211 lb 3.2 oz) Most recent weight: Weight: 95.8 kg (211 lb 3.2 oz)       Palliative Assessment/Data:   Additional Data Reviewed: CBC    Component Value Date/Time   WBC 23.4* 06/02/2015 0526     RBC 4.74 06/15/2015 0526   HGB 15.6 06/07/2015 0535   HCT 46.0 05/31/2015 0535   PLT 218 06/18/2015 0526   MCV 91.1 06/14/2015 0526   MCH 28.5 06/17/2015 0526   MCHC 31.3 06/06/2015 0526   RDW 16.3* 06/14/2015 0526   LYMPHSABS 1.0 06/20/2015 0526   MONOABS 0.6 06/15/2015 0526   EOSABS 0.0 06/17/2015 0526   BASOSABS 0.0 05/26/2015 0526    CMP     Component Value Date/Time   NA 136 06/17/2015 0535   K 4.2 06/20/2015 0535   CL 103 06/16/2015 0535   CO2 24 05/29/2015 0557   GLUCOSE 126* 06/13/2015 0535   BUN 38* 05/30/2015 0535   CREATININE 2.60* 06/06/2015 0535   CALCIUM 8.3* 05/29/2015 0557   PROT 6.6 05/18/2015 2140   ALBUMIN 3.1* 05/18/2015 2140   AST 21 05/18/2015 2140   ALT 19 05/18/2015 2140   ALKPHOS 98 05/18/2015 2140   BILITOT 0.6 05/18/2015 2140   GFRNONAA >60 05/29/2015 0557   GFRAA >60 05/29/2015 0557       Problem List:  Patient Active Problem List   Diagnosis Date Noted  . Bladder cancer ([place]) 06/03/2015  . Sepsis ([place]) 06/03/2015  . Malnutrition of moderate degree 06/02/2015  . Intractable nausea and vomiting 06/02/2015  . Anemia of chronic disease 06/01/2015  . Protein-calorie malnutrition, severe ([place]) 06/01/2015  . Paroxysmal atrial fibrillation ([place]) 06/01/2015  . Abdominal distension 06/01/2015  . Palliative care encounter   . Pain   . DNR (do not resuscitate)   . Hypertensive urgency 05/21/2015  . CKD (chronic kidney disease) stage 3, GFR 30-59 ml/min 05/21/2015  . History of DVT (deep vein thrombosis) 05/21/2015  . Leukocytosis 05/21/2015  . Gastric outlet obstruction 05/18/2015  . Urothelial carcinoma ([place]) 02/15/2015  . Rheumatoid arthritis ([place]) 02/15/2015  . UTI (lower urinary tract infection) 11/08/2011     Palliative Care Assessment & Plan    1.Code Status:  DNR  Code Status Orders        Start     Ordered   06/21/2015 1017  Do not attempt resuscitation (DNR)   Continuous    Question Answer Comment  In the event  of cardiac or respiratory ARREST Do not call a "code blue"   In the event of cardiac or respiratory ARREST Do not perform Intubation, CPR, defibrillation or ACLS   In the event of cardiac or respiratory ARREST Use medication by any route, position, wound care, and other measures to relive pain and suffering. May use oxygen, suction and manual treatment of airway obstruction as needed for comfort.      06/09/2015 1017    Code Status History    Date Active Date Inactive Code Status Order ID Comments User Context   06/12/2015  7:52 AM 06/10/2015 10:17 AM DNR [id number]  Venetia Maxon Rama, MD ED   05/19/2015 12:02 AM 06/02/2015  8:52 PM DNR [id number]  Etta Quill, DO ED   05/07/2015  2:31 AM 05/11/2015  3:15 PM Full Code [id number]  Ivor Costa, MD ED   04/05/2015  3:59 AM 04/09/2015  2:58 PM Full Code [id number]  Oswald Hillock, MD Inpatient   03/14/2015  4:26 PM 03/20/2015  6:52 PM Full Code [id number]  Elmarie Shiley, MD Inpatient   02/15/2015  8:15 PM 02/22/2015 10:17 PM Full Code [id number]  Venetia Maxon Rama, MD Inpatient   05/15/2014 12:30 AM 05/15/2014  8:16 PM Full Code [id number]  Allyne Gee, MD Inpatient   07/04/2012  8:45 PM 07/08/2012  4:33 PM Full Code [id number]  Derrill Kay, MD ED   11/08/2011 11:45 PM 11/13/2011  5:35 AM Full Code [id number]  Lenis Noon, RN Inpatient       2. Goals of Care/Additional Recommendations:  Comfort care. Actively dying.   Limitations on Scope of Treatment: Full Comfort Care  Desire for further Chaplaincy support:yes  Psycho-social Needs: Caregiving  Support/Resources and Grief/Bereavement Support  3. Symptom Management:      1. Pain/dyspnea: Morphine 2 mg/hr and 1-2 mg bolus every 15 min prn.       2. Anxiety: Ativan 1-2 mg every 2 hours prn.       3. Congestion/gurgling/secretions: Robinul 0.2 mg every 4 hours prn. Atropine drops SL prn.      4. Nausea: Ondansetron 4 mg every 8 hours.   4. Palliative Prophylaxis:   Frequent Pain Assessment and  Oral Care  5. Prognosis: Hours - Days  6. Discharge Planning:  Anticipated Hospital Death   Thank you for allowing the Palliative Medicine Team to assist in the care of this patient.   Time In: 0900-0950,1710-1730 Total Time 32min Prolonged Time Billed  yes        Pershing Proud, NP  [id number], 10:52 AM  Please contact Palliative Medicine Team phone at [phone] for questions and concerns.

## 2015-06-03 NOTE — ED Notes (Signed)
Per EMS- Hospice pt. C/O SOB. Home health came before with oxygen . Placed on nonrebreather- sats low 90s. Left sided nephrostomy tube came out when when getting up from recliner. no significant bleeding or bruising to flank.

## 2015-06-03 NOTE — H&P (Signed)
History and Physical:    Craig Waters.   [id number] DOB: Nov 03, 1930 DOA: 06/05/2015  Referring MD/provider: Dr. Randal Buba PCP: [name], MD   Chief Complaint: Uncontrolled pain and shortness of breath  History of Present Illness:   Craig Waters. is an 82 y.o. male with a PMH of atrial fibrillation, DVT status post IVC filter (not a candidate for anticoagulation secondary to hematuria), stage IV invasive urothelial bladder cancer status post TURBT 02/28/15, status post nephrostomy and ureteral tubes, recent hospitalizations 05/06/15-05/11/15 and 05/18/15-06/02/15, most recent hospitalization for treatment of gastric outlet obstruction status post placement of a duodenal stent 05/25/15, ultimately discharged home with hospice care who was brought back 06/18/2015 secondary to uncontrolled abdominal pain, nausea, new onset shortness of breath. The patient's daughter has been frustrated with his care thus far and seems overwhelmed by his symptoms and prognosis. Apparently, when the patient got home yesterday, the daughter did not have the necessary medications to control her father symptoms, and was told by her pharmacy that one of his medications required preauthorization and another of his medications was not in stock. Patient has pain in all of his joints and his abdomen. He apparently pulled out one of his nephrostomy tubes. He also had sudden onset of shortness of breath that is now resolved. He has had chronic nausea but no vomiting since admission. Upon initial evaluation in the ED, a chest x-ray showed possible pneumonia and his daughter indicated that she would like him to receive antibiotics and IV fluids and therefore he was referred for admission. He did require a nonrebreather mask on presentation secondary to hypoxia.  ROS:   Review of Systems  Constitutional: Positive for weight loss and malaise/fatigue. Negative for fever and chills.  HENT: Negative.   Eyes: Negative.     Respiratory: Positive for shortness of breath. Negative for cough and sputum production.   Cardiovascular: Negative for chest pain.  Gastrointestinal: Positive for nausea, vomiting and abdominal pain.  Musculoskeletal: Positive for joint pain.  Skin: Negative.   Neurological: Positive for weakness.  Endo/Heme/Allergies: Negative.   Psychiatric/Behavioral: Negative.      Past Medical History:   Past Medical History  Diagnosis Date  . GERD (gastroesophageal reflux disease)   . DM w/o complication type II ([place])   . RA (rheumatoid arthritis) ([place])   . Transfusion history   . Bladder cancer metastasized to liver ([place])   . HTN (hypertension)   . PAF (paroxysmal atrial fibrillation) ([place])   . Peptic ulcer disease   . CAD (coronary artery disease)     Nonobstructive by cardiac cath 2013  . Nephrolithiasis   . Gastric outlet obstruction     Status post duodenal stent    Past Surgical History:   Past Surgical History  Procedure Laterality Date  . Gastric ulcer  10-18-11    '91-blleding ulcer with blood transfusions after  . Appendectomy  10-18-11    age 36  . Cataract extraction, bilateral  10-18-11    bilateral   . Eye surgery  10-18-11    laser eye surgery s/p cataract bilaterally  . Cardiac catheterization  10-18-11    7-8 yrs ago-mild blockage,no tx. indicated  . Prostatectomy  10/27/2011    Procedure: PROSTATECTOMY SUPRAPUBIC;  Surgeon: Ailene Rud, MD;  Location: [facility];  Service: Urology;  Laterality: N/A;  Placement of suprapubic tube  . Cystoscopy  10/27/2011    Procedure: CYSTOSCOPY FLEXIBLE;  Surgeon: Ailene Rud, MD;  Location: [facility];  Service: Urology;  Laterality: N/A;  . Transurethral resection of bladder tumor with gyrus (turbt-gyrus)  06/2014    T1 HG tumor.  [place] Esophagogastroduodenoscopy (egd) with propofol N/A 05/20/2015    Procedure: ESOPHAGOGASTRODUODENOSCOPY (EGD) WITH PROPOFOL;  Surgeon: Ladene Artist, MD;  Location: [facility]  END[facility];  Service: Endoscopy;  Laterality: N/A;  . Esophagogastroduodenoscopy (egd) with propofol N/A 05/25/2015    Procedure: ESOPHAGOGASTRODUODENOSCOPY (EGD) WITH PROPOFOL;  Surgeon: Milus Banister, MD;  Location: [facility];  Service: Endoscopy;  Laterality: N/A;  . Duodenal stent placement N/A 05/25/2015    Procedure: DUODENAL STENT PLACEMENT;  Surgeon: Milus Banister, MD;  Location: [facility];  Service: Endoscopy;  Laterality: N/A;    Social History:   Social History   Social History  . Marital Status: Married    Spouse Name: N/A  . Number of Children: N/A  . Years of Education: N/A   Occupational History  . [employer] Employ[employer]   Social History Main Topics  . Smoking status: Former Smoker -- 1.00 packs/day for 5 years    Types: Cigarettes    Quit date: 09/26/1978  . Smokeless tobacco: Current User    Types: Chew    Last Attempt to Quit: 11/01/2011  . Alcohol Use: No  . Drug Use: No  . Sexual Activity: No   Other Topics Concern  . Not on file   Social History Narrative   Married, one daughter. One cup of coffee a day. He is semiretired.    Family history:   Family History  Problem Relation Age of Onset  . Breast cancer Sister   . Dementia Mother   . Heart attack Father   . Colon cancer Neg Hx     Allergies   Review of patient's allergies indicates no known allergies.  Current Medications:   Prior to Admission medications   Medication Sig Start Date End Date Taking? Authorizing Provider  diltiazem (CARDIZEM) 10 mg/ml oral suspension Take 3 mLs (30 mg total) by mouth 4 (four) times daily. 06/02/15  Yes Janece Canterbury, MD  feeding supplement, ENSURE ENLIVE, (ENSURE ENLIVE) LIQD Take 237 mLs by mouth 2 (two) times daily between meals. 06/02/15  Yes Janece Canterbury, MD  fentaNYL (DURAGESIC - DOSED MCG/HR) 50 MCG/HR Place 1 patch (50 mcg total) onto the skin every 3 (three) days. 06/02/15  Yes Janece Canterbury, MD  haloperidol  (HALDOL) 2 MG/ML solution Take 0.5 mLs (1 mg total) by mouth every 6 (six) hours as needed for agitation (hallucination). 06/02/15  Yes Janece Canterbury, MD  lactulose (CHRONULAC) 10 GM/15ML solution Take 45 mLs (30 g total) by mouth 2 (two) times daily. 06/12/2015  Yes Janece Canterbury, MD  Morphine Sulfate (MORPHINE CONCENTRATE) 10 MG/0.5ML SOLN concentrated solution Take 0.5 mLs (10 mg total) by mouth every 30 (thirty) minutes as needed for severe pain. 06/02/15  Yes Janece Canterbury, MD  omeprazole (PRILOSEC) 2 mg/mL SUSP Take 10 mLs (20 mg total) by mouth daily. 06/02/15  Yes Janece Canterbury, MD  ondansetron (ZOFRAN-ODT) 4 MG disintegrating tablet Take 1 tablet (4 mg total) by mouth every 8 (eight) hours. 06/02/15  Yes Janece Canterbury, MD  phenazopyridine (PYRIDIUM) 100 MG tablet Take 1 tablet (100 mg total) by mouth 3 (three) times daily with meals. 06/02/15  Yes Janece Canterbury, MD  predniSONE 5 MG/5ML solution Take 5 mLs (5 mg total) by mouth daily with breakfast. 06/02/15  Yes Janece Canterbury, MD  sennosides-docusate sodium (SENOKOT-S) 8.6-50 MG tablet  Take 2 tablets by mouth at bedtime.   Yes Historical Provider, MD    Physical Exam:   Filed Vitals:   06/21/2015 [id number] 05/26/2015 0628 06/11/2015 0725 06/22/2015 0851  BP: 94/75 110/69 105/68 92/50  Pulse: 104 102 97 98  Temp:      TempSrc:      Resp: 16  16 16   Height:    5\' 11"  (1.803 m)  Weight:    95.8 kg (211 lb 3.2 oz)  SpO2: 94% 94% 95% 96%     Physical Exam: Blood pressure 92/50, pulse 98, temperature 97.7 F (36.5 C), temperature source Axillary, resp. rate 16, height 5\' 11"  (1.803 m), weight 95.8 kg (211 lb 3.2 oz), SpO2 96 %. Gen: Mild distress secondary to pain. Head: Normocephalic, atraumatic. Eyes: PERRL, EOMI, sclerae nonicteric. Mouth: Oropharynx with dry mucous membranes. Neck: Supple, no thyromegaly, no lymphadenopathy, no jugular venous distention. Chest: Lungs clear to auscultation bilaterally. CV: Heart sounds are mildly  tachycardic. No murmurs, rubs, or gallops. Abdomen: Softly distended and diffusely tender, with normal active bowel sounds. Extremities: Extremities are with 3+ pitting edema bilaterally, cold to touch. Skin: Cold lower extremities otherwise warm and dry. Neuro: Alert and oriented times 3; grossly nonfocal. Psych: Mood and affect flat.   Data Review:    Labs: Basic Metabolic Panel:  Recent Labs Lab 05/28/15 0545 05/29/15 0557 06/19/2015 0535  NA 141 139 136  K 4.0 3.4* 4.2  CL 107 107 103  CO2 27 24  --   GLUCOSE 123* 100* 126*  BUN 26* 28* 38*  CREATININE 1.04 1.08 2.60*  CALCIUM 8.8* 8.3*  --    Liver Function Tests: No results for input(s): AST, ALT, ALKPHOS, BILITOT, PROT, ALBUMIN in the last 168 hours. No results for input(s): LIPASE, AMYLASE in the last 168 hours. No results for input(s): AMMONIA in the last 168 hours. CBC:  Recent Labs Lab 05/28/15 0545 05/29/15 0557 06/18/2015 0526 06/05/2015 0535  WBC 12.1* 14.1* 23.4*  --   NEUTROABS  --   --  21.8*  --   HGB 11.9* 12.1* 13.5 15.6  HCT 37.2* 38.4* 43.2 46.0  MCV 89.6 89.7 91.1  --   PLT 235 216 218  --    Cardiac Enzymes: No results for input(s): CKTOTAL, CKMB, CKMBINDEX, TROPONINI in the last 168 hours.  BNP (last 3 results) No results for input(s): PROBNP in the last 8760 hours. CBG:  Recent Labs Lab 05/31/15 0718  GLUCAP 93    Radiographic Studies: US Abdomen Limited  06/01/2015  CLINICAL DATA:  Abdominal distention.  Clinical concern for ascites. EXAM: LIMITED ABDOMEN ULTRASOUND FOR ASCITES TECHNIQUE: Limited ultrasound survey for ascites was performed in all four abdominal quadrants. COMPARISON:  Abdomen and pelvis CT dated 03/27/2016. FINDINGS: Survey of the 4 quadrants of the abdomen demonstrated no free peritoneal fluid. IMPRESSION: No free peritoneal fluid. Electronically Signed   By: Claudie Revering M.D.   On: 06/01/2015 17:53   Dg Chest Portable 1 View  06/11/2015  CLINICAL DATA:  Acute onset  of worsening shortness of breath. Decreased O2 saturation. Initial encounter. EXAM: PORTABLE CHEST 1 VIEW COMPARISON:  Chest radiograph from 04/05/2015 FINDINGS: The lungs are hypoexpanded. There is mild elevation of the right hemidiaphragm. Vascular congestion is noted. Mild bibasilar opacities may reflect pneumonia, given the patient's symptoms. There is no evidence of pleural effusion or pneumothorax. The cardiomediastinal silhouette is within normal limits. No acute osseous abnormalities are seen. IMPRESSION: Lungs hypoexpanded. Mild elevation of the right  hemidiaphragm. Vascular congestion seen. Mild bibasilar opacities may reflect pneumonia, given the patient's symptoms. Electronically Signed   By: Garald Balding M.D.   On: 05/27/2015 05:49   *I have personally reviewed the images above*  EKG: Independently reviewed. Sinus tachycardia at 110 bpm.   Assessment/Plan:   Principal Problem:   Acute hypoxic respiratory failure rule out sepsis secondary to HCAP versus acute pulmonary edema secondary to volume overload - Patient required nonrebreather mask on presentation, subsequently weaned to nasal cannula. - Chest x-ray showed hypoexpanded lungs with vascular congestion, possible pneumonia. - WBC markedly elevated, hypotensive, tachycardic. Sepsis and differential. - Serum lactate elevated at 5.04. Blood and urine cultures sent. - We'll place on empiric Levaquin for now. Although patient is a DNR with poor prognosis, family wants antibiotics. - KVO IV fluids for now.  Active Problems:   UTI (lower urinary tract infection) - Follow-up urine cultures and continue empiric Levaquin.    Urothelial carcinoma ([place]) with liver metastasis with pain - Poor prognosis. Palliative care reconsulted. - We'll start on a morphine drip for pain control.    Rheumatoid arthritis (HCC) - Continue prednisone.    Gastric outlet obstruction - Status post duodenal stent. Continue full liquid diet.    Acute  kidney injury/CKD (chronic kidney disease) stage 3, GFR 30-59 ml/min - Nephrostomy tube out. We'll need to consider replacement. Deferred a palliative care whether we should pursue this.    History of DVT (deep vein thrombosis) - Status post IVC filter.    Paroxysmal atrial fibrillation (HCC) - Was in normal sinus rhythm on admission.    DVT prophylaxis - Lovenox ordered.  Code Status / Family Communication / Disposition Plan:   Code Status: Full. Family Communication: Daughter at the bedside. Disposition Plan: Home when stable.  Attestation regarding necessity of inpatient status:   The appropriate admission status for this patient is INPATIENT. Inpatient status is judged to be reasonable and necessary in order to provide the required intensity of service to ensure the patient's safety. The patient's presenting symptoms, physical exam findings, and initial radiographic and laboratory data in the context of their chronic comorbidities is felt to place them at high risk for further clinical deterioration. Furthermore, it is not anticipated that the patient will be medically stable for discharge from the hospital within 2 midnights of admission. The following factors support the admission status of inpatient.   -The patient's presenting symptoms include abdominal pain, hypoxic respiratory failure. - The worrisome physical exam findings include tachycardia, hypotension. - The initial radiographic and laboratory data are worrisome because of leukocytosis, acute kidney injury with elevated creatinine, interstitial edema versus pneumonia on chest x-ray. - The chronic co-morbidities include stage IV bladder cancer. - Patient requires inpatient status due to high intensity of service, high risk for further deterioration and high frequency of surveillance required. - I certify that at the point of admission it is my clinical judgment that the patient will require inpatient hospital care spanning  beyond 2 midnights from the point of admission.   Time spent: 75 minutes.  RAMA,CHRISTINA [facility] Pager [phone] Cell: [phone]   If 7PM-7AM, please contact night-coverage [email or web address] Password [email or web address] 06/11/2015, 11:32 AM

## 2015-06-03 NOTE — ED Notes (Signed)
Bed: [id number] Expected date:  Expected time:  Means of arrival:  Comments: EMS 82 yo M pulled out nephrostomy tube / shob / hospice

## 2015-06-04 ENCOUNTER — Ambulatory Visit: Payer: Medicare Other

## 2015-06-04 DIAGNOSIS — J9601 Acute respiratory failure with hypoxia: Secondary | ICD-10-CM

## 2015-06-04 DIAGNOSIS — G893 Neoplasm related pain (acute) (chronic): Secondary | ICD-10-CM | POA: Insufficient documentation

## 2015-06-04 DIAGNOSIS — K5900 Constipation, unspecified: Secondary | ICD-10-CM | POA: Insufficient documentation

## 2015-06-04 LAB — URINE CULTURE

## 2015-06-07 ENCOUNTER — Ambulatory Visit: Payer: Medicare Other

## 2015-06-08 ENCOUNTER — Ambulatory Visit: Payer: Medicare Other

## 2015-06-08 LAB — CULTURE, BLOOD (ROUTINE X 2)
CULTURE: NO GROWTH
Culture: NO GROWTH

## 2015-06-09 ENCOUNTER — Ambulatory Visit: Payer: Medicare Other

## 2015-06-09 ENCOUNTER — Ambulatory Visit (HOSPITAL_COMMUNITY): Payer: Medicare Other

## 2015-06-10 ENCOUNTER — Ambulatory Visit: Payer: Medicare Other

## 2015-06-11 ENCOUNTER — Ambulatory Visit: Payer: Medicare Other

## 2015-06-14 ENCOUNTER — Ambulatory Visit: Payer: Medicare Other

## 2015-06-14 NOTE — Progress Notes (Signed)
  Radiation Oncology         [phone]) [phone] ________________________________  Name: Craig Waters. MRN: [id number]  Date: 05/26/2015  DOB: [birth year]  End of Treatment Note  DIAGNOSIS: Mr. [name]. Is a 82 y.o gentleman with urothelial carcinoma for consideration of palliative radiation to the bladder.    Indication for treatment:  Palliation       Radiation treatment dates:   05/26/2015  Site/dose:   The patient only received the first of 10 fractions for a total of 3 Gy  Beams/energy:   A 4-field technique was used.  Narrative: The patient tolerated radiation treatment relatively poorly.  His condition deteriorated significantly before receiving any significant portion of his planned radiation.  Plan: The patient has completed radiation treatment. ________________________________  Sheral Apley. Tammi Klippel, M.D.

## 2015-06-15 ENCOUNTER — Ambulatory Visit: Payer: Medicare Other | Admitting: Oncology

## 2015-06-15 ENCOUNTER — Ambulatory Visit: Payer: Medicare Other

## 2015-06-23 NOTE — Progress Notes (Addendum)
During rounds pt was found unresponsive, no breath sounds , no pulse. Second RN Ailene Ravel confirmed death at [phone]. Family at bedside. Md notified Death Certificate signed. [facility] was notified. Morphine drip 210cc was wasted in sink with Arion Morgan Rn.

## 2015-06-23 NOTE — Discharge Summary (Signed)
   Death Summary  Craig Waters. [id number] DOB: 1931-04-18 DOA: 06-09-2015  PCP: [name], MD   Admit date: 2015-06-09 Date of Death: 2015/06/10 at 6:27AM  Final Diagnoses:  Principal Problem:   Acute respiratory failure with hypoxia ([place]) Active Problems:   UTI (lower urinary tract infection)   Urothelial carcinoma (HCC)   Rheumatoid arthritis (HCC)   Gastric outlet obstruction   CKD (chronic kidney disease) stage 3, GFR 30-59 ml/min   History of DVT (deep vein thrombosis)   Paroxysmal atrial fibrillation (HCC)   Pain   Bladder cancer (HCC)   Sepsis ([place])   Acute kidney injury ([place])   Dyspnea and respiratory abnormality    History of present illness:  Craig Waters. is an 82 y.o. male with a PMH of atrial fibrillation, DVT status post IVC filter (not a candidate for anticoagulation secondary to hematuria), stage IV invasive urothelial bladder cancer status post TURBT 02/28/15, status post nephrostomy and ureteral tubes, recent hospitalizations 05/06/15-05/11/15 and 05/18/15-06/02/15, most recent hospitalization for treatment of gastric outlet obstruction status post placement of a duodenal stent 05/25/15, ultimately discharged home with hospice care who was brought back 2015/06/09 secondary to uncontrolled abdominal pain, nausea, new onset shortness of breath. The patient's daughter has been frustrated with his care thus far and seems overwhelmed by his symptoms and prognosis. Apparently, when the patient got home yesterday, the daughter did not have the necessary medications to control her father symptoms, and was told by her pharmacy that one of his medications required preauthorization and another of his medications was not in stock. Patient has pain in all of his joints and his abdomen. He apparently pulled out one of his nephrostomy tubes. He also had sudden onset of shortness of breath that is now resolved. He has had chronic nausea but no vomiting since admission. Upon  initial evaluation in the ED, a chest x-ray showed possible pneumonia and his daughter indicated that she would like him to receive antibiotics and IV fluids and therefore he was referred for admission. He did require a nonrebreather mask on presentation secondary to hypoxia.  Hospital Course:  Acute hypoxic respiratory failure rule out sepsis secondary to HCAP versus acute pulmonary edema secondary to volume overload Patient required nonrebreather mask on presentation, subsequently weaned to nasal cannula. Patient was empirically placed on antibiotics. Prognosis was thought to be very poor. Patient was already a DO NOT RESUSCITATE. Palliative medicine was reconsulted. When they assessed him. Patient was already declining. Comfort care measures were initiated. Patient subsequently passed away this morning.  UTI (lower urinary tract infection) Patient was empirically placed on Levaquin at admission  Urothelial carcinoma with liver metastasis with pain Patient was thought to have poor prognosis and palliative medicine was consulted. Patient was placed on a morphine infusion.   Rheumatoid arthritis  Patient was continued on prednisone   Gastric outlet obstruction He was status post duodenal stent.   Acute kidney injury/CKD (chronic kidney disease) stage 3, GFR 30-59 ml/min Nephrostomy tube was pulled out by the patient.   History of DVT (deep vein thrombosis) Status post IVC filter.  Paroxysmal atrial fibrillation Was in normal sinus rhythm on admission.   Ha Placeres  [facility] 10-Jun-2015, 9:34 AM

## 2015-06-23 DEATH — deceased

## 2017-05-14 IMAGING — US IR FLUORO GUIDE CV LINE*R*
2 series · 5 of 5 positions shown · non-contrast
Comparison: none

CLINICAL DATA: 84-year-old male admitted with acute renal failure.
He has been referred for evaluation for hemodialysis catheter
placement. Catheter placement is indicated.
TECHNIQUE: The procedure, risks, benefits, and alternatives were explained to
the patient. Questions regarding the procedure were encouraged and
answered. The patient understands and consents to the procedure.

[Series 1: ir (id) (id)/(id)/(id) ir · 2 of 2 slices shown (1 of 2)]
[im 1/2]
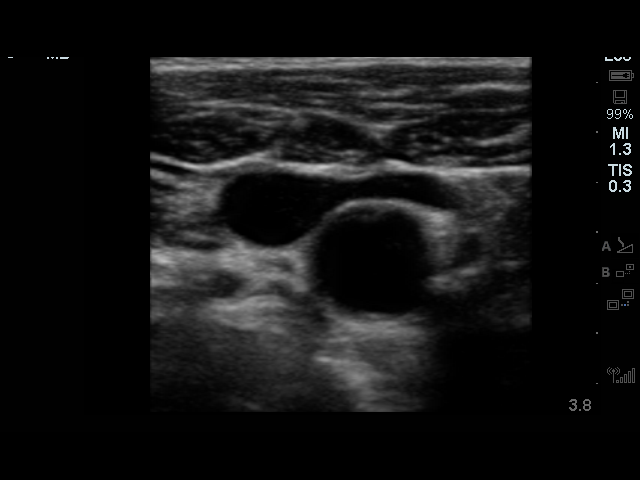
[im 2/2]
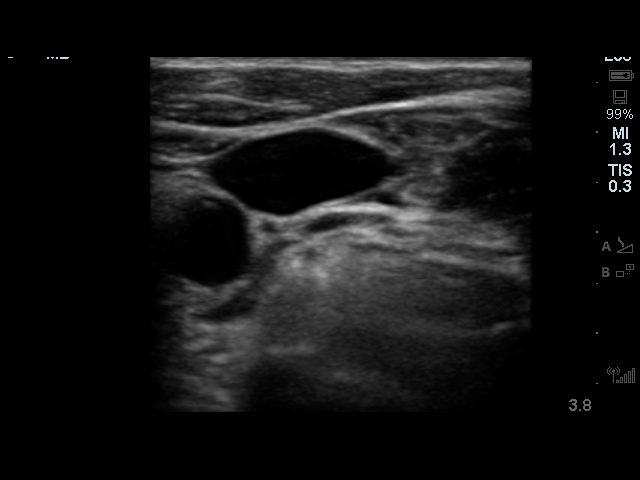

[Series 1: ir (id) (id)/(id)/(id) ir · 3 of 3 slices shown (2 of 2)]
[im 1/3]
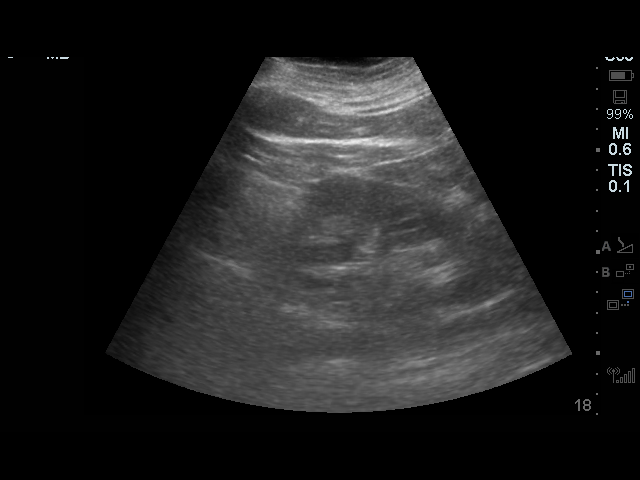
[im 2/3]
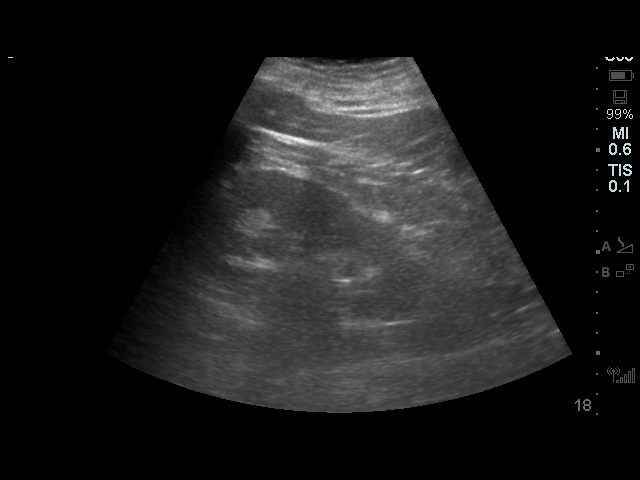
[im 3/3]
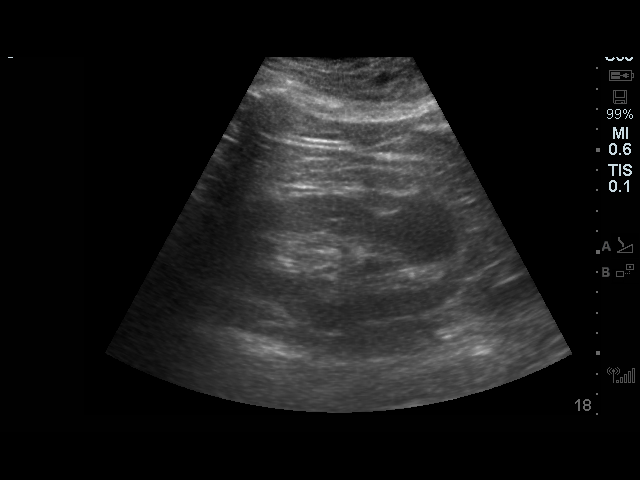

[5 of 5 positions shown; findings below may reference images not displayed]

EXAM:
IR RIGHT FLOURO GUIDE CV LINE; IR ULTRASOUND GUIDANCE VASC ACCESS
RIGHT

Date: 02/16/2015

ANESTHESIA/SEDATION:
Moderate (conscious) sedation was administered during this
procedure. A total of 0.5 mg Versed and 25 mg Fentanyl were
administered intravenously. The patient's vital signs were monitored
continuously by radiology nursing throughout the course of the
procedure.

Total sedation time: 11 minutes

FLUOROSCOPY TIME:  18 seconds
The right neck and chest was prepped with chlorhexidine, and draped
in the usual sterile fashion using maximum barrier technique (cap
and mask, sterile gown, sterile gloves, large sterile sheet, hand
hygiene and cutaneous antiseptic). Local anesthesia was attained by
infiltration with 1% lidocaine with epinephrine.

Ultrasound demonstrated patency of the right internal jugular vein,
and this was documented with an image. Under real-time ultrasound
guidance, this vein was accessed with a 21 gauge micropuncture
needle and image documentation was performed. A small dermatotomy
was made at the access site with an 11 scalpel. A 0.018" wire was
advanced into the SVC and the access needle exchanged for a 4F
micropuncture vascular sheath. The 0.018" wire was then removed and
a 0.035" wire advanced into the IVC.

Dilation of the tissue tract was performed with a 10 French tissue
dilator.

A 20 cm triple-lumen trialysis catheter was then placed using
Seldinger technique, with the tip terminating at the superior
cavoatrial junction.

Wire was removed.  The catheter was sutured in position.

Patient tolerated the procedure well and remained hemodynamically
stable throughout.

No complications were encountered and no significant blood loss was
encountered.

COMPLICATIONS:
None
IMPRESSION: Status post placement of right IJ temporary hemodialysis catheter.
Catheter ready for use.

## 2017-05-17 IMAGING — DX DG ABD PORTABLE 1V
1 series · 2 of 2 positions shown · non-contrast
Comparison: None.

CLINICAL DATA: Abdominal pain, nausea, constipation

EXAM:
PORTABLE ABDOMEN - 1 VIEW

[Series 1: abdomen kub · 0.14mm/px · 2 of 2 slices shown]
[im 1/2]
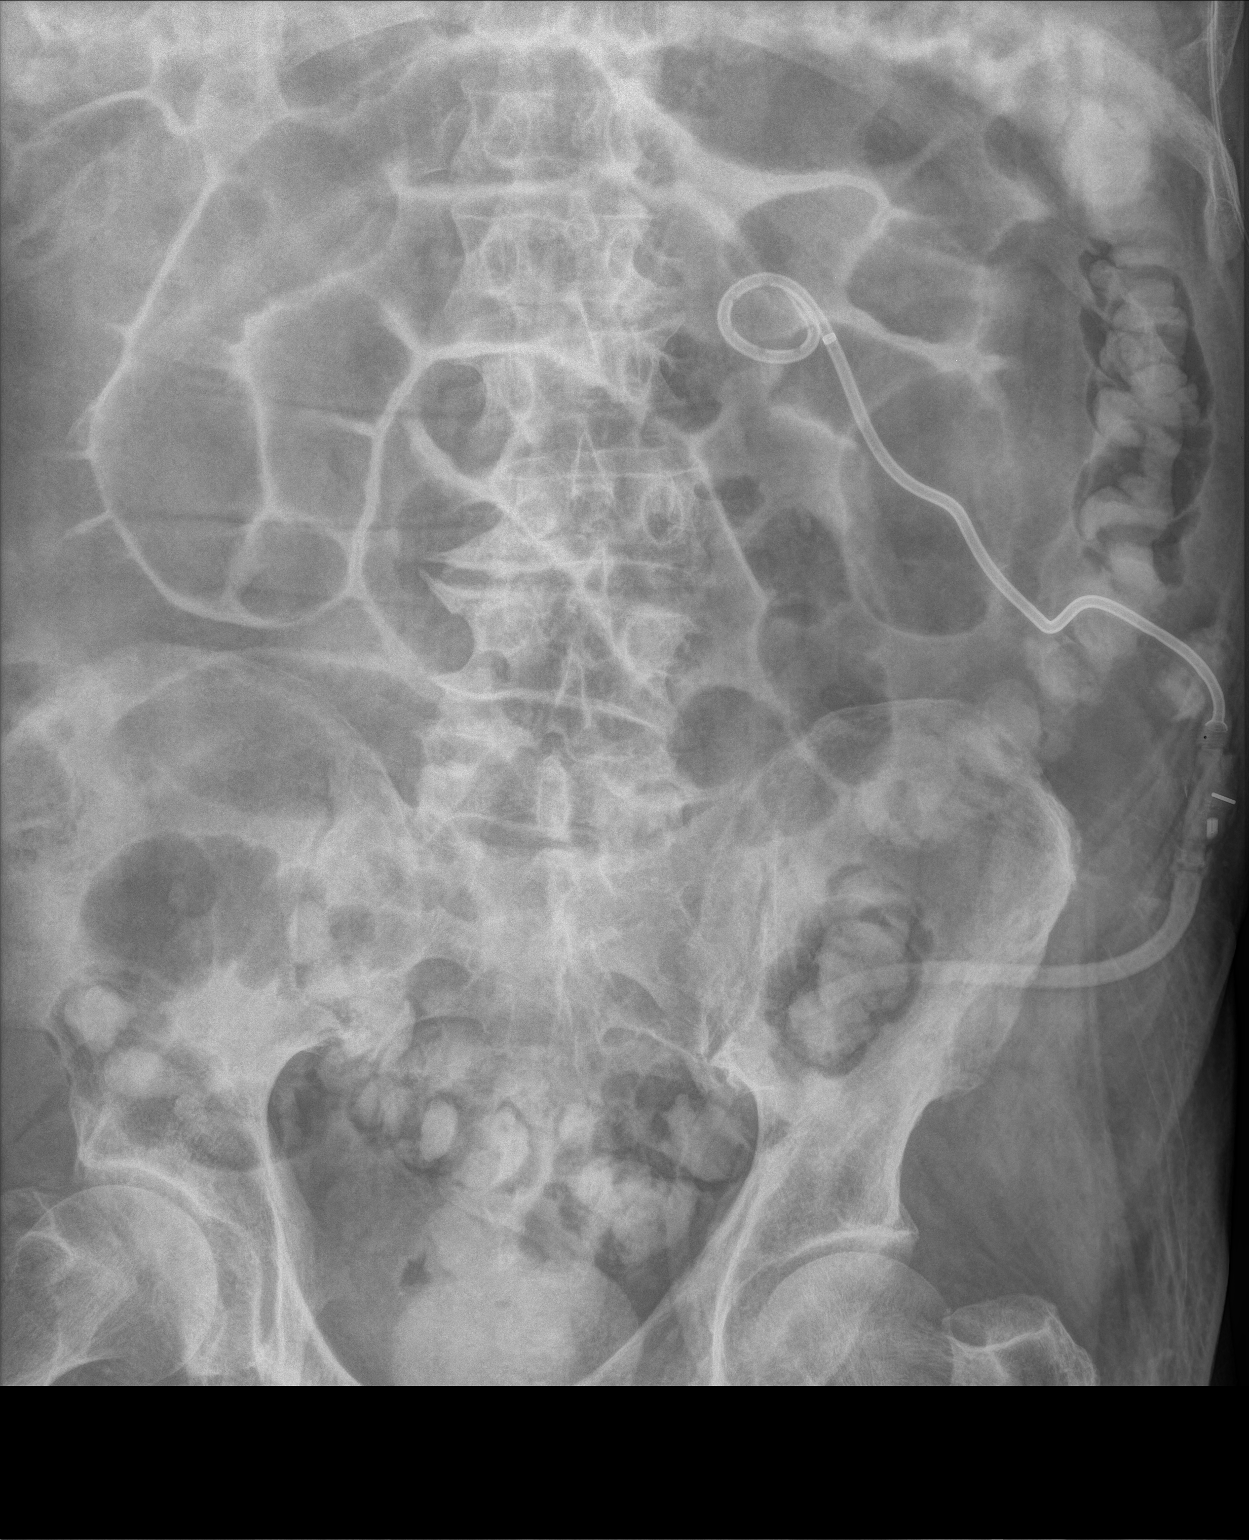
[im 2/2]
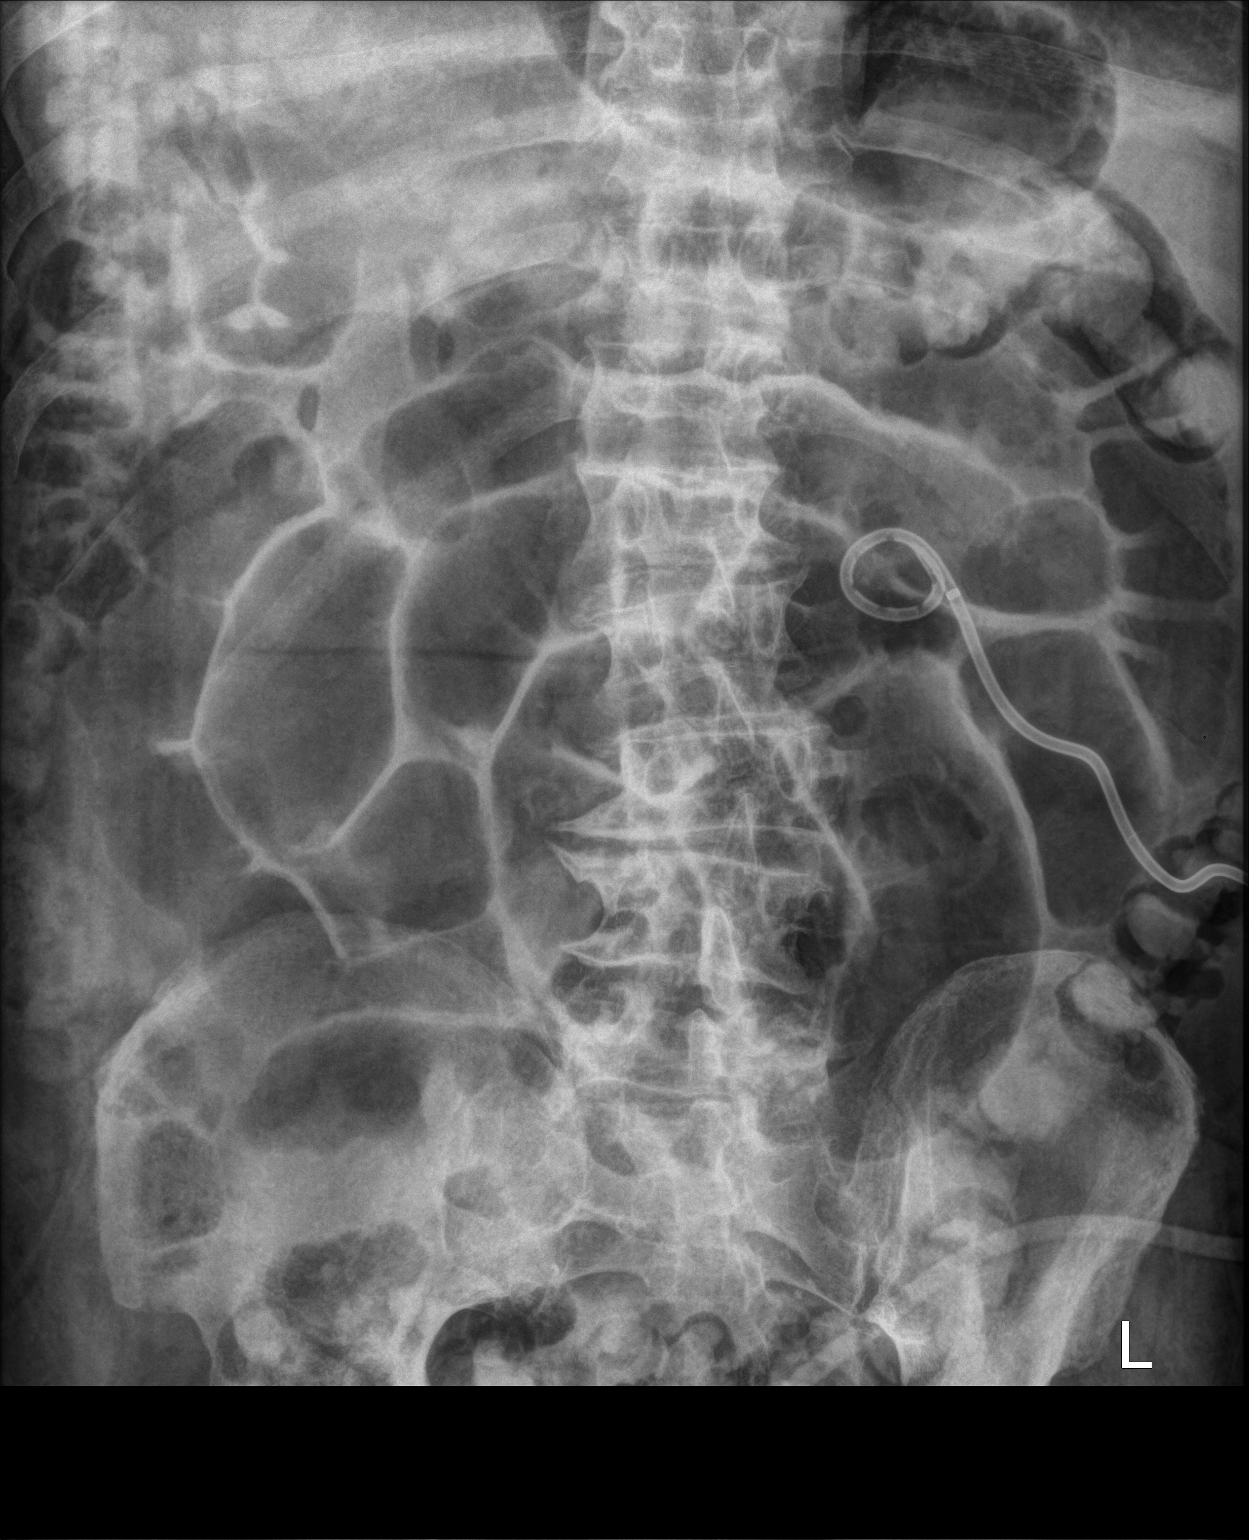

[2 of 2 positions shown; findings below may reference images not displayed]

FINDINGS: Left nephrostomy catheter is noted. Gaseous distended small bowel
loops on mid abdomen highly suspicious for significant ileus or
partial small bowel obstruction. Stool and gas noted in distal
colon.
IMPRESSION: Gaseous distended small bowel loops mid abdomen highly suspicious
for significant ileus or bowel obstruction.

## 2017-05-18 IMAGING — DX DG ABDOMEN 2V
2 series · 2 of 2 positions shown · non-contrast
Comparison: 02/19/2015

CLINICAL DATA: Abdominal pain, follow-up ileus, status post
nephrectomy

EXAM:
ABDOMEN - 2 VIEW

[abdomen supine]
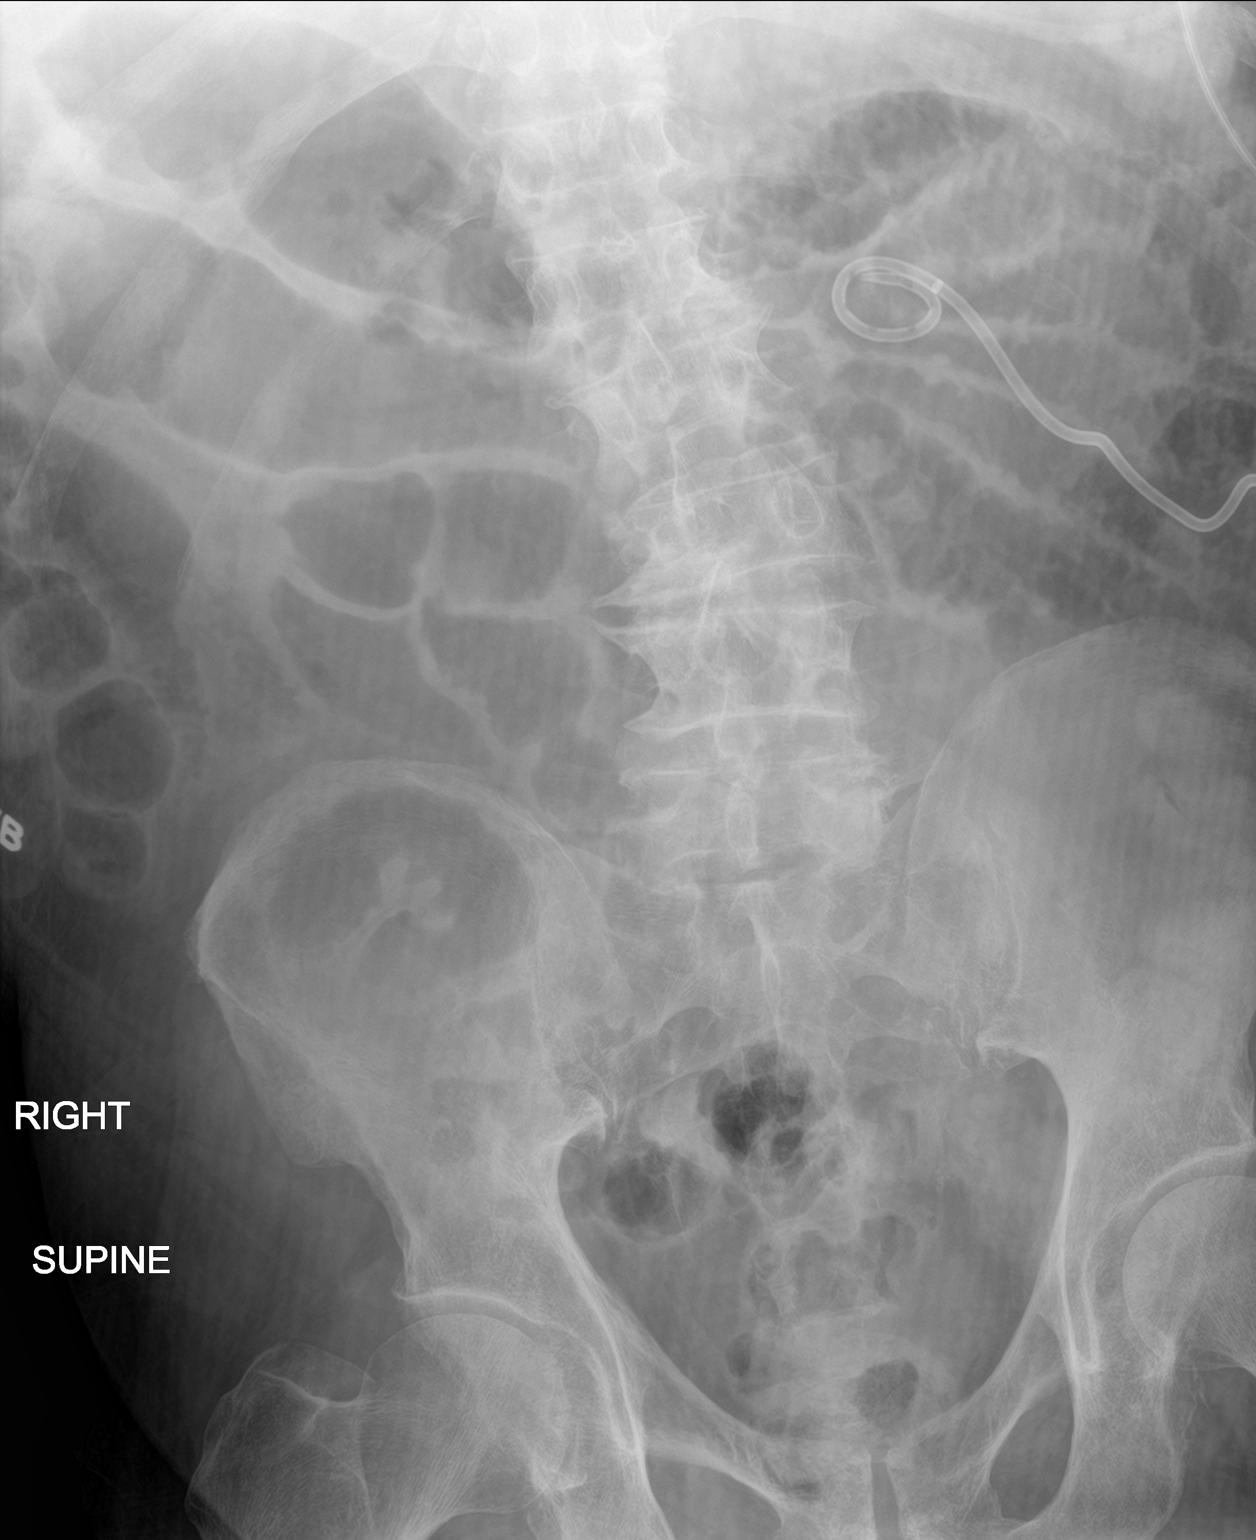

[abdomen decu]
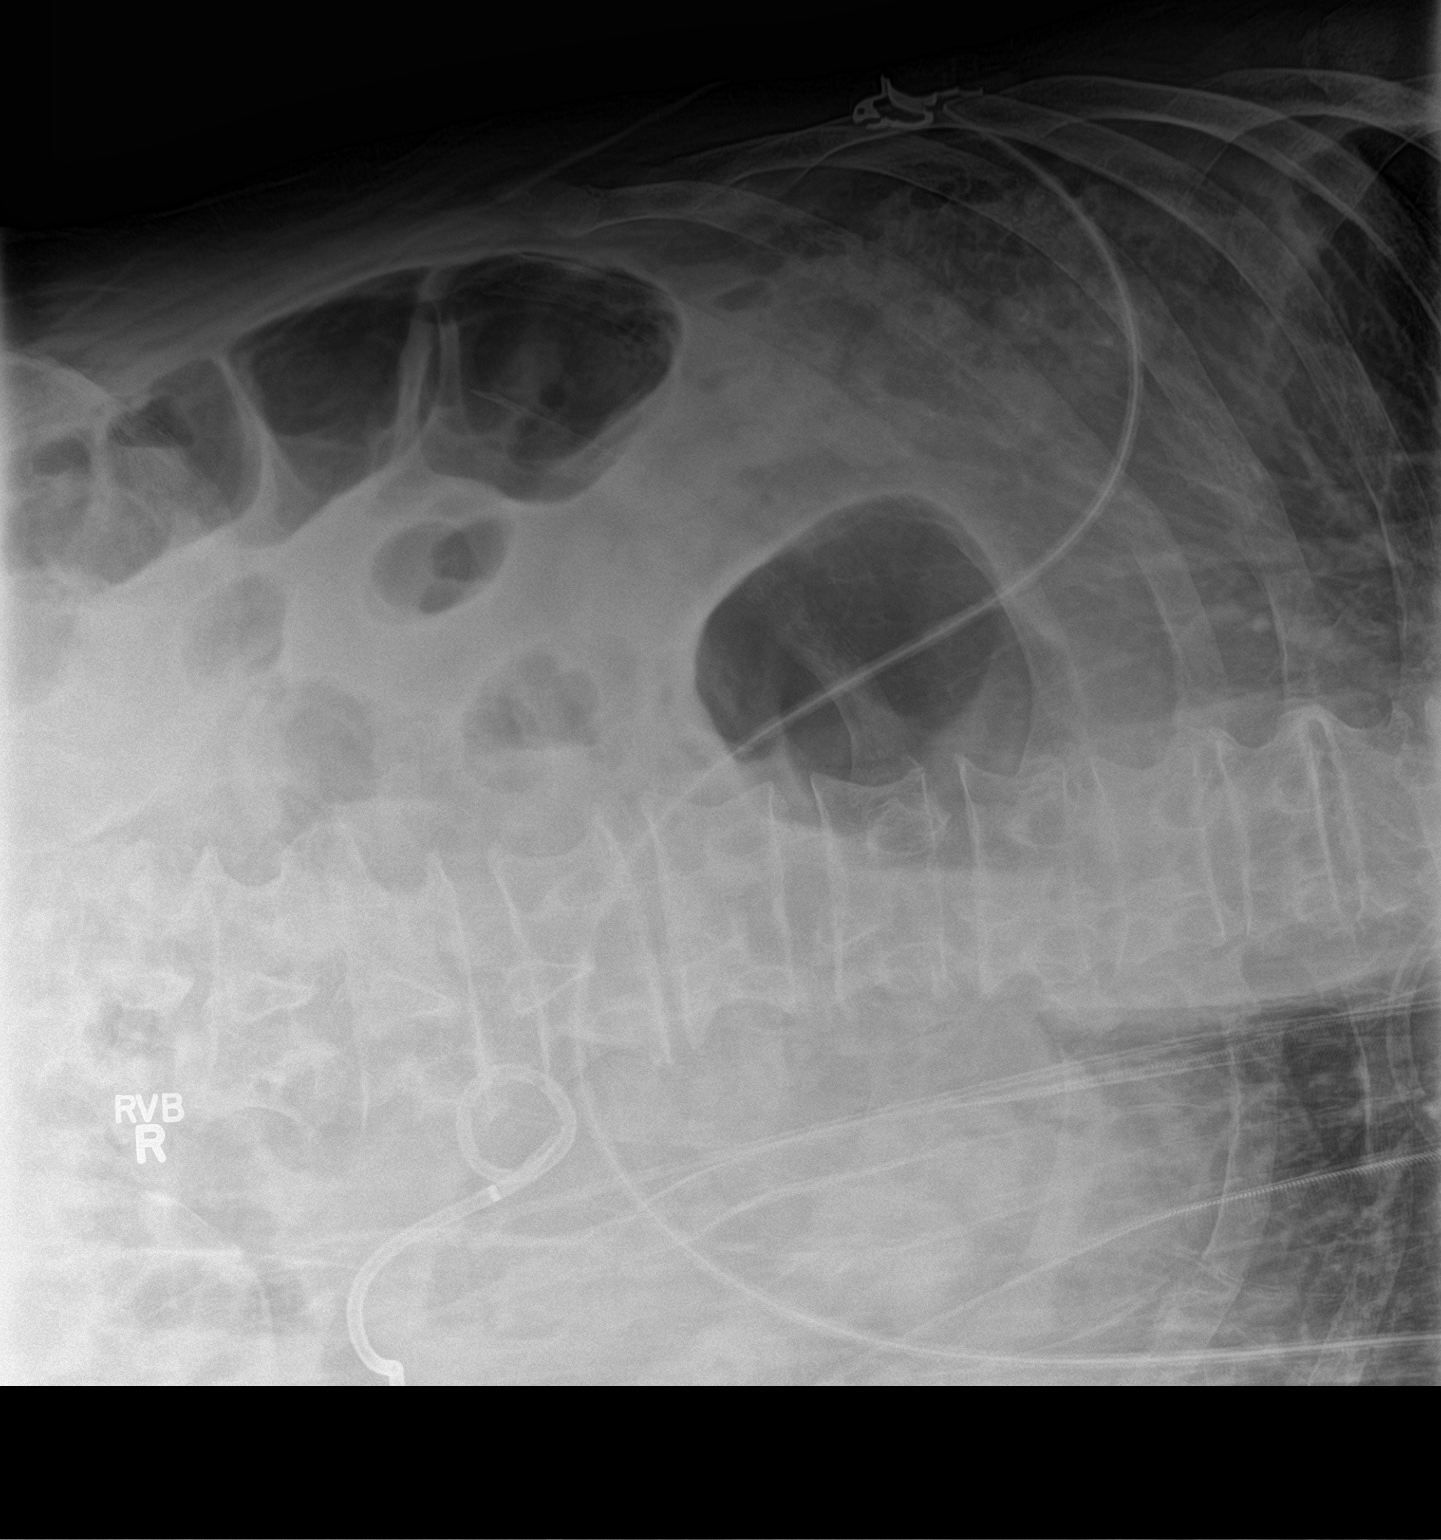

[2 of 2 positions shown; findings below may reference images not displayed]

FINDINGS: Mild gaseous distention of small and large bowel, compatible with
history of postoperative adynamic ileus.

No evidence of bowel obstruction.

Left percutaneous nephrostomy.

Degenerative changes the lumbar spine.
IMPRESSION: No evidence of bowel obstruction.

Mild gaseous distention of small and large bowel, compatible with
history of postoperative adynamic ileus.
